# Patient Record
Sex: Male | Born: 1955 | Race: Black or African American | Hispanic: No | Marital: Single | Smoking: Current every day smoker
Health system: Southern US, Community
[De-identification: ages and names within clinical notes are randomized; demographics above are authoritative.]

## PROBLEM LIST (undated history)

## (undated) DIAGNOSIS — G4730 Sleep apnea, unspecified: Secondary | ICD-10-CM

## (undated) DIAGNOSIS — E785 Hyperlipidemia, unspecified: Secondary | ICD-10-CM

## (undated) DIAGNOSIS — D751 Secondary polycythemia: Secondary | ICD-10-CM

## (undated) DIAGNOSIS — K219 Gastro-esophageal reflux disease without esophagitis: Secondary | ICD-10-CM

## (undated) DIAGNOSIS — E119 Type 2 diabetes mellitus without complications: Secondary | ICD-10-CM

## (undated) DIAGNOSIS — M5430 Sciatica, unspecified side: Secondary | ICD-10-CM

## (undated) DIAGNOSIS — I10 Essential (primary) hypertension: Secondary | ICD-10-CM

## (undated) DIAGNOSIS — M1990 Unspecified osteoarthritis, unspecified site: Secondary | ICD-10-CM

## (undated) HISTORY — DX: Secondary polycythemia: D75.1

---

## 2005-05-12 ENCOUNTER — Inpatient Hospital Stay: Payer: Self-pay | Admitting: Internal Medicine

## 2005-05-26 ENCOUNTER — Emergency Department: Payer: Self-pay | Admitting: Emergency Medicine

## 2005-06-23 ENCOUNTER — Emergency Department: Payer: Self-pay | Admitting: Unknown Physician Specialty

## 2005-08-28 ENCOUNTER — Ambulatory Visit: Payer: Self-pay

## 2005-10-24 ENCOUNTER — Ambulatory Visit: Payer: Self-pay | Admitting: Family Medicine

## 2005-10-25 ENCOUNTER — Ambulatory Visit: Payer: Self-pay

## 2006-07-06 ENCOUNTER — Ambulatory Visit: Payer: Self-pay

## 2006-07-30 ENCOUNTER — Ambulatory Visit: Payer: Self-pay

## 2006-08-02 ENCOUNTER — Emergency Department: Payer: Self-pay | Admitting: Emergency Medicine

## 2006-08-25 ENCOUNTER — Ambulatory Visit: Payer: Self-pay

## 2006-12-01 ENCOUNTER — Emergency Department: Payer: Self-pay | Admitting: Internal Medicine

## 2007-01-11 ENCOUNTER — Ambulatory Visit: Payer: Self-pay

## 2007-03-03 ENCOUNTER — Other Ambulatory Visit: Payer: Self-pay

## 2007-03-03 ENCOUNTER — Ambulatory Visit: Payer: Self-pay | Admitting: Unknown Physician Specialty

## 2007-10-04 ENCOUNTER — Emergency Department: Payer: Self-pay | Admitting: Emergency Medicine

## 2007-10-26 ENCOUNTER — Ambulatory Visit: Payer: Self-pay | Admitting: Pain Medicine

## 2007-10-26 ENCOUNTER — Ambulatory Visit: Payer: Self-pay | Admitting: Unknown Physician Specialty

## 2007-11-16 ENCOUNTER — Ambulatory Visit: Payer: Self-pay | Admitting: Unknown Physician Specialty

## 2008-12-19 ENCOUNTER — Emergency Department: Payer: Self-pay | Admitting: Emergency Medicine

## 2009-03-13 ENCOUNTER — Emergency Department: Payer: Self-pay | Admitting: Emergency Medicine

## 2009-09-20 ENCOUNTER — Emergency Department: Payer: Self-pay | Admitting: Emergency Medicine

## 2009-10-09 ENCOUNTER — Emergency Department: Payer: Self-pay | Admitting: Unknown Physician Specialty

## 2010-11-14 ENCOUNTER — Emergency Department: Payer: Self-pay | Admitting: Emergency Medicine

## 2011-01-23 ENCOUNTER — Emergency Department: Payer: Self-pay | Admitting: Internal Medicine

## 2011-03-10 ENCOUNTER — Ambulatory Visit: Payer: Self-pay | Admitting: Pain Medicine

## 2011-07-28 ENCOUNTER — Emergency Department: Payer: Self-pay | Admitting: *Deleted

## 2012-01-14 DIAGNOSIS — Z79899 Other long term (current) drug therapy: Secondary | ICD-10-CM | POA: Insufficient documentation

## 2012-01-14 DIAGNOSIS — Z23 Encounter for immunization: Secondary | ICD-10-CM | POA: Insufficient documentation

## 2012-01-14 DIAGNOSIS — E291 Testicular hypofunction: Secondary | ICD-10-CM | POA: Insufficient documentation

## 2012-01-14 DIAGNOSIS — R6882 Decreased libido: Secondary | ICD-10-CM | POA: Insufficient documentation

## 2012-01-14 DIAGNOSIS — N529 Male erectile dysfunction, unspecified: Secondary | ICD-10-CM | POA: Insufficient documentation

## 2012-01-14 DIAGNOSIS — N138 Other obstructive and reflux uropathy: Secondary | ICD-10-CM | POA: Insufficient documentation

## 2012-01-27 ENCOUNTER — Ambulatory Visit: Payer: Self-pay | Admitting: Internal Medicine

## 2012-01-27 LAB — IRON AND TIBC
Iron Bind.Cap.(Total): 272 ug/dL (ref 250–450)
Iron Saturation: 46 %
Iron: 125 ug/dL (ref 65–175)
Unbound Iron-Bind.Cap.: 147 ug/dL

## 2012-01-27 LAB — CBC CANCER CENTER
Basophil #: 0.1 x10 3/mm (ref 0.0–0.1)
Basophil %: 1.5 %
Eosinophil #: 0.1 x10 3/mm (ref 0.0–0.7)
Eosinophil %: 0.8 %
HCT: 54 % — ABNORMAL HIGH (ref 40.0–52.0)
HGB: 18.7 g/dL — ABNORMAL HIGH (ref 13.0–18.0)
Lymphocyte #: 1.7 x10 3/mm (ref 1.0–3.6)
Lymphocyte %: 25.9 %
MCH: 33.4 pg (ref 26.0–34.0)
MCHC: 34.6 g/dL (ref 32.0–36.0)
MCV: 97 fL (ref 80–100)
Monocyte #: 0.5 x10 3/mm (ref 0.2–1.0)
Monocyte %: 7.3 %
Neutrophil #: 4.2 x10 3/mm (ref 1.4–6.5)
Neutrophil %: 64.5 %
Platelet: 177 x10 3/mm (ref 150–440)
RBC: 5.6 10*6/uL (ref 4.40–5.90)
RDW: 13.8 % (ref 11.5–14.5)
WBC: 6.6 x10 3/mm (ref 3.8–10.6)

## 2012-01-27 LAB — FERRITIN: Ferritin (ARMC): 117 ng/mL (ref 8–388)

## 2012-02-03 LAB — CANCER CENTER HEMATOCRIT: HCT: 48.9 % (ref 40.0–52.0)

## 2012-02-10 LAB — CANCER CENTER HEMATOCRIT: HCT: 51.7 % (ref 40.0–52.0)

## 2012-02-17 LAB — CANCER CENTER HEMATOCRIT: HCT: 48.4 % (ref 40.0–52.0)

## 2012-02-25 ENCOUNTER — Ambulatory Visit: Payer: Self-pay | Admitting: Internal Medicine

## 2012-03-02 LAB — CBC CANCER CENTER
Basophil #: 0 x10 3/mm (ref 0.0–0.1)
Basophil %: 0.6 %
Eosinophil #: 0 x10 3/mm (ref 0.0–0.7)
Eosinophil %: 0.6 %
HCT: 53.2 % — ABNORMAL HIGH (ref 40.0–52.0)
HGB: 18.2 g/dL — ABNORMAL HIGH (ref 13.0–18.0)
Lymphocyte #: 1.9 x10 3/mm (ref 1.0–3.6)
Lymphocyte %: 28.6 %
MCH: 32.8 pg (ref 26.0–34.0)
MCHC: 34.2 g/dL (ref 32.0–36.0)
MCV: 96 fL (ref 80–100)
Monocyte #: 0.6 x10 3/mm (ref 0.2–1.0)
Monocyte %: 8.7 %
Neutrophil #: 4 x10 3/mm (ref 1.4–6.5)
Neutrophil %: 61.5 %
Platelet: 177 x10 3/mm (ref 150–440)
RBC: 5.55 10*6/uL (ref 4.40–5.90)
RDW: 14.1 % (ref 11.5–14.5)
WBC: 6.5 x10 3/mm (ref 3.8–10.6)

## 2012-03-08 ENCOUNTER — Ambulatory Visit: Payer: Self-pay | Admitting: Family Medicine

## 2012-03-23 LAB — CANCER CENTER HEMATOCRIT: HCT: 50.1 % (ref 40.0–52.0)

## 2012-03-27 ENCOUNTER — Ambulatory Visit: Payer: Self-pay | Admitting: Internal Medicine

## 2012-04-01 ENCOUNTER — Other Ambulatory Visit: Payer: Self-pay | Admitting: Neurological Surgery

## 2012-04-01 DIAGNOSIS — M4712 Other spondylosis with myelopathy, cervical region: Secondary | ICD-10-CM

## 2012-04-06 ENCOUNTER — Ambulatory Visit
Admission: RE | Admit: 2012-04-06 | Discharge: 2012-04-06 | Disposition: A | Discharge status: Home / Self Care | Payer: Medicaid Other | Source: Ambulatory Visit | Attending: Neurological Surgery | Admitting: Neurological Surgery

## 2012-04-06 DIAGNOSIS — M4712 Other spondylosis with myelopathy, cervical region: Secondary | ICD-10-CM

## 2012-04-12 DIAGNOSIS — R972 Elevated prostate specific antigen [PSA]: Secondary | ICD-10-CM | POA: Insufficient documentation

## 2012-04-13 LAB — CANCER CENTER HEMATOCRIT: HCT: 53.1 % — ABNORMAL HIGH (ref 40.0–52.0)

## 2012-04-24 ENCOUNTER — Ambulatory Visit: Payer: Self-pay | Admitting: Internal Medicine

## 2012-05-04 LAB — CANCER CENTER HEMATOCRIT: HCT: 54.1 % — ABNORMAL HIGH (ref 40.0–52.0)

## 2012-05-11 LAB — CANCER CENTER HEMATOCRIT: HCT: 49.7 % (ref 40.0–52.0)

## 2012-05-18 LAB — CANCER CENTER HEMATOCRIT: HCT: 48.6 % (ref 40.0–52.0)

## 2012-05-25 ENCOUNTER — Ambulatory Visit: Payer: Self-pay | Admitting: Internal Medicine

## 2012-05-25 LAB — CANCER CENTER HEMATOCRIT: HCT: 52.1 % — ABNORMAL HIGH (ref 40.0–52.0)

## 2012-06-15 LAB — CANCER CENTER HEMATOCRIT: HCT: 48.8 % (ref 40.0–52.0)

## 2012-06-24 ENCOUNTER — Ambulatory Visit: Payer: Self-pay | Admitting: Internal Medicine

## 2012-07-06 LAB — CBC CANCER CENTER
Basophil #: 0 x10 3/mm (ref 0.0–0.1)
Basophil %: 0.6 %
Eosinophil #: 0 x10 3/mm (ref 0.0–0.7)
Eosinophil %: 0.6 %
HCT: 50.6 % (ref 40.0–52.0)
HGB: 17.5 g/dL (ref 13.0–18.0)
Lymphocyte #: 1.7 x10 3/mm (ref 1.0–3.6)
Lymphocyte %: 26.8 %
MCH: 32.2 pg (ref 26.0–34.0)
MCHC: 34.5 g/dL (ref 32.0–36.0)
MCV: 93 fL (ref 80–100)
Monocyte #: 0.7 x10 3/mm (ref 0.2–1.0)
Monocyte %: 10.8 %
Neutrophil #: 4 x10 3/mm (ref 1.4–6.5)
Neutrophil %: 61.2 %
Platelet: 173 x10 3/mm (ref 150–440)
RBC: 5.42 10*6/uL (ref 4.40–5.90)
RDW: 13.8 % (ref 11.5–14.5)
WBC: 6.5 x10 3/mm (ref 3.8–10.6)

## 2012-07-25 ENCOUNTER — Ambulatory Visit: Payer: Self-pay | Admitting: Internal Medicine

## 2012-07-27 LAB — CANCER CENTER HEMATOCRIT: HCT: 52.1 % — ABNORMAL HIGH (ref 40.0–52.0)

## 2012-08-17 LAB — CANCER CENTER HEMATOCRIT: HCT: 48.2 % (ref 40.0–52.0)

## 2012-08-24 ENCOUNTER — Ambulatory Visit: Payer: Self-pay | Admitting: Internal Medicine

## 2012-09-07 LAB — CANCER CENTER HEMATOCRIT: HCT: 50.4 % (ref 40.0–52.0)

## 2012-09-24 ENCOUNTER — Ambulatory Visit: Payer: Self-pay | Admitting: Internal Medicine

## 2012-09-28 LAB — CANCER CENTER HEMATOCRIT: HCT: 47.8 % (ref 40.0–52.0)

## 2012-10-19 LAB — CANCER CENTER HEMATOCRIT: HCT: 50.6 % (ref 40.0–52.0)

## 2012-10-25 ENCOUNTER — Ambulatory Visit: Payer: Self-pay | Admitting: Internal Medicine

## 2012-11-23 LAB — CANCER CENTER HEMATOCRIT: HCT: 51.1 % (ref 40.0–52.0)

## 2012-11-24 ENCOUNTER — Ambulatory Visit: Payer: Self-pay | Admitting: Internal Medicine

## 2012-12-07 LAB — CANCER CENTER HEMATOCRIT: HCT: 49.9 % (ref 40.0–52.0)

## 2012-12-21 LAB — CANCER CENTER HEMATOCRIT: HCT: 51 % (ref 40.0–52.0)

## 2012-12-25 ENCOUNTER — Ambulatory Visit: Payer: Self-pay | Admitting: Internal Medicine

## 2013-01-04 LAB — CANCER CENTER HEMATOCRIT: HCT: 46.5 % (ref 40.0–52.0)

## 2013-01-18 LAB — CANCER CENTER HEMATOCRIT: HCT: 52.4 % — ABNORMAL HIGH (ref 40.0–52.0)

## 2013-01-24 ENCOUNTER — Ambulatory Visit: Payer: Self-pay | Admitting: Internal Medicine

## 2013-01-25 ENCOUNTER — Emergency Department: Payer: Self-pay | Admitting: Emergency Medicine

## 2013-02-01 ENCOUNTER — Ambulatory Visit: Payer: Self-pay | Admitting: Internal Medicine

## 2013-02-01 LAB — CANCER CENTER HEMATOCRIT: HCT: 48.4 % (ref 40.0–52.0)

## 2013-02-15 LAB — CANCER CENTER HEMATOCRIT: HCT: 54.7 % — ABNORMAL HIGH (ref 40.0–52.0)

## 2013-02-24 ENCOUNTER — Ambulatory Visit: Payer: Self-pay | Admitting: Internal Medicine

## 2013-03-01 LAB — CBC CANCER CENTER
Basophil #: 0 x10 3/mm (ref 0.0–0.1)
Basophil %: 0.5 %
Eosinophil #: 0 x10 3/mm (ref 0.0–0.7)
Eosinophil %: 0.5 %
HCT: 47.9 % (ref 40.0–52.0)
HGB: 16 g/dL (ref 13.0–18.0)
Lymphocyte #: 2 x10 3/mm (ref 1.0–3.6)
Lymphocyte %: 23.6 %
MCH: 30.8 pg (ref 26.0–34.0)
MCHC: 33.4 g/dL (ref 32.0–36.0)
MCV: 92 fL (ref 80–100)
Monocyte #: 0.8 x10 3/mm (ref 0.2–1.0)
Monocyte %: 9.6 %
Neutrophil #: 5.7 x10 3/mm (ref 1.4–6.5)
Neutrophil %: 65.8 %
Platelet: 178 x10 3/mm (ref 150–440)
RBC: 5.19 10*6/uL (ref 4.40–5.90)
RDW: 14.5 % (ref 11.5–14.5)
WBC: 8.7 x10 3/mm (ref 3.8–10.6)

## 2013-03-15 LAB — HEMATOCRIT: HCT: 49.8 % (ref 40.0–52.0)

## 2013-03-27 ENCOUNTER — Ambulatory Visit: Payer: Self-pay | Admitting: Internal Medicine

## 2013-04-19 LAB — CANCER CENTER HEMATOCRIT: HCT: 50.9 % (ref 40.0–52.0)

## 2013-04-24 ENCOUNTER — Ambulatory Visit: Payer: Self-pay | Admitting: Internal Medicine

## 2013-05-17 LAB — CANCER CENTER HEMATOCRIT: HCT: 50.2 % (ref 40.0–52.0)

## 2013-05-23 DIAGNOSIS — R339 Retention of urine, unspecified: Secondary | ICD-10-CM | POA: Insufficient documentation

## 2013-05-25 ENCOUNTER — Ambulatory Visit: Payer: Self-pay | Admitting: Internal Medicine

## 2013-06-14 LAB — CANCER CENTER HEMATOCRIT: HCT: 46.3 % (ref 40.0–52.0)

## 2013-06-24 ENCOUNTER — Ambulatory Visit: Payer: Self-pay | Admitting: Internal Medicine

## 2013-07-12 LAB — CANCER CENTER HEMATOCRIT: HCT: 46.1 % (ref 40.0–52.0)

## 2013-07-25 ENCOUNTER — Ambulatory Visit: Payer: Self-pay | Admitting: Internal Medicine

## 2013-07-25 ENCOUNTER — Emergency Department: Payer: Self-pay | Admitting: Emergency Medicine

## 2013-08-02 ENCOUNTER — Ambulatory Visit: Payer: Self-pay | Admitting: Internal Medicine

## 2013-08-02 LAB — CBC CANCER CENTER
Basophil #: 0.1 x10 3/mm (ref 0.0–0.1)
Basophil %: 0.8 %
Eosinophil #: 0.1 x10 3/mm (ref 0.0–0.7)
Eosinophil %: 1.1 %
HCT: 49.7 % (ref 40.0–52.0)
HGB: 16.4 g/dL (ref 13.0–18.0)
Lymphocyte #: 2.1 x10 3/mm (ref 1.0–3.6)
Lymphocyte %: 21.5 %
MCH: 30.9 pg (ref 26.0–34.0)
MCHC: 33 g/dL (ref 32.0–36.0)
MCV: 94 fL (ref 80–100)
Monocyte #: 1.2 x10 3/mm — ABNORMAL HIGH (ref 0.2–1.0)
Monocyte %: 12.1 %
Neutrophil #: 6.3 x10 3/mm (ref 1.4–6.5)
Neutrophil %: 64.5 %
Platelet: 205 x10 3/mm (ref 150–440)
RBC: 5.31 10*6/uL (ref 4.40–5.90)
RDW: 15.5 % — ABNORMAL HIGH (ref 11.5–14.5)
WBC: 9.7 x10 3/mm (ref 3.8–10.6)

## 2013-08-05 ENCOUNTER — Emergency Department: Payer: Self-pay | Admitting: Emergency Medicine

## 2013-08-24 ENCOUNTER — Ambulatory Visit: Payer: Self-pay | Admitting: Internal Medicine

## 2013-09-25 ENCOUNTER — Emergency Department: Payer: Self-pay | Admitting: Emergency Medicine

## 2013-10-25 ENCOUNTER — Ambulatory Visit: Payer: Self-pay | Admitting: Internal Medicine

## 2013-10-25 LAB — CANCER CENTER HEMATOCRIT: HCT: 49 % (ref 40.0–52.0)

## 2013-11-24 ENCOUNTER — Ambulatory Visit: Payer: Self-pay | Admitting: Internal Medicine

## 2014-01-17 ENCOUNTER — Ambulatory Visit: Payer: Self-pay | Admitting: Internal Medicine

## 2014-01-17 LAB — CANCER CENTER HEMATOCRIT: HCT: 51.9 % (ref 40.0–52.0)

## 2014-01-24 ENCOUNTER — Ambulatory Visit: Payer: Self-pay | Admitting: Internal Medicine

## 2014-04-10 ENCOUNTER — Ambulatory Visit: Payer: Self-pay | Admitting: Internal Medicine

## 2014-05-29 ENCOUNTER — Ambulatory Visit
Admit: 2014-05-29 | Disposition: A | Discharge status: Home / Self Care | Payer: Self-pay | Attending: Internal Medicine | Admitting: Internal Medicine

## 2014-05-29 LAB — CANCER CENTER HEMATOCRIT: HCT: 53 % — ABNORMAL HIGH (ref 40.0–52.0)

## 2014-06-30 ENCOUNTER — Encounter: Payer: Self-pay | Admitting: *Deleted

## 2014-06-30 ENCOUNTER — Other Ambulatory Visit: Payer: Self-pay | Admitting: *Deleted

## 2014-06-30 DIAGNOSIS — D751 Secondary polycythemia: Secondary | ICD-10-CM

## 2014-06-30 HISTORY — DX: Secondary polycythemia: D75.1

## 2014-07-04 ENCOUNTER — Inpatient Hospital Stay: Payer: Medicare HMO | Attending: Internal Medicine

## 2014-07-04 ENCOUNTER — Ambulatory Visit: Payer: Medicaid Other | Admitting: Internal Medicine

## 2014-07-24 ENCOUNTER — Other Ambulatory Visit: Payer: Self-pay | Admitting: *Deleted

## 2014-07-24 DIAGNOSIS — D751 Secondary polycythemia: Secondary | ICD-10-CM

## 2014-07-26 ENCOUNTER — Inpatient Hospital Stay: Payer: Medicare HMO

## 2014-07-26 ENCOUNTER — Inpatient Hospital Stay: Payer: Medicare HMO | Attending: Internal Medicine

## 2014-07-26 ENCOUNTER — Encounter: Payer: Self-pay | Admitting: Internal Medicine

## 2014-07-26 ENCOUNTER — Inpatient Hospital Stay (HOSPITAL_BASED_OUTPATIENT_CLINIC_OR_DEPARTMENT_OTHER): Payer: Medicare HMO | Admitting: Internal Medicine

## 2014-07-26 VITALS — BP 145/86 | HR 80 | Temp 96.3°F | Ht 71.0 in | Wt 300.7 lb

## 2014-07-26 DIAGNOSIS — D751 Secondary polycythemia: Secondary | ICD-10-CM | POA: Insufficient documentation

## 2014-07-26 DIAGNOSIS — Z79899 Other long term (current) drug therapy: Secondary | ICD-10-CM | POA: Diagnosis not present

## 2014-07-26 DIAGNOSIS — E291 Testicular hypofunction: Secondary | ICD-10-CM | POA: Diagnosis not present

## 2014-07-26 DIAGNOSIS — Z7982 Long term (current) use of aspirin: Secondary | ICD-10-CM | POA: Insufficient documentation

## 2014-07-26 DIAGNOSIS — I10 Essential (primary) hypertension: Secondary | ICD-10-CM

## 2014-07-26 DIAGNOSIS — Z794 Long term (current) use of insulin: Secondary | ICD-10-CM | POA: Diagnosis not present

## 2014-07-26 DIAGNOSIS — Z9989 Dependence on other enabling machines and devices: Secondary | ICD-10-CM | POA: Diagnosis not present

## 2014-07-26 DIAGNOSIS — R5382 Chronic fatigue, unspecified: Secondary | ICD-10-CM | POA: Insufficient documentation

## 2014-07-26 DIAGNOSIS — M545 Low back pain: Secondary | ICD-10-CM | POA: Diagnosis not present

## 2014-07-26 DIAGNOSIS — G8929 Other chronic pain: Secondary | ICD-10-CM

## 2014-07-26 DIAGNOSIS — G4730 Sleep apnea, unspecified: Secondary | ICD-10-CM | POA: Insufficient documentation

## 2014-07-26 DIAGNOSIS — M1990 Unspecified osteoarthritis, unspecified site: Secondary | ICD-10-CM | POA: Insufficient documentation

## 2014-07-26 DIAGNOSIS — E785 Hyperlipidemia, unspecified: Secondary | ICD-10-CM

## 2014-07-26 DIAGNOSIS — R0609 Other forms of dyspnea: Secondary | ICD-10-CM | POA: Insufficient documentation

## 2014-07-26 DIAGNOSIS — E119 Type 2 diabetes mellitus without complications: Secondary | ICD-10-CM

## 2014-07-26 DIAGNOSIS — F17210 Nicotine dependence, cigarettes, uncomplicated: Secondary | ICD-10-CM | POA: Diagnosis not present

## 2014-07-26 LAB — CBC WITH DIFFERENTIAL/PLATELET
Basophils Absolute: 0 10*3/uL (ref 0–0.1)
Basophils Relative: 0 %
Eosinophils Absolute: 0.1 10*3/uL (ref 0–0.7)
Eosinophils Relative: 2 %
HCT: 49.5 % (ref 40.0–52.0)
Hemoglobin: 16.7 g/dL (ref 13.0–18.0)
Lymphocytes Relative: 31 %
Lymphs Abs: 2.4 10*3/uL (ref 1.0–3.6)
MCH: 31.5 pg (ref 26.0–34.0)
MCHC: 33.8 g/dL (ref 32.0–36.0)
MCV: 93.3 fL (ref 80.0–100.0)
Monocytes Absolute: 0.9 10*3/uL (ref 0.2–1.0)
Monocytes Relative: 11 %
Neutro Abs: 4.4 10*3/uL (ref 1.4–6.5)
Neutrophils Relative %: 56 %
Platelets: 174 10*3/uL (ref 150–440)
RBC: 5.3 MIL/uL (ref 4.40–5.90)
RDW: 14.6 % — ABNORMAL HIGH (ref 11.5–14.5)
WBC: 7.8 10*3/uL (ref 3.8–10.6)

## 2014-07-26 NOTE — Progress Notes (Signed)
[place]  [phone] [phone]     ID: Corey Foster OB: 05-03-1955  MR#: [id number]  FHL#:[id number]  Patient Care Team: Lorelee Market, MD as PCP - General (Family Medicine)  CHIEF COMPLAINT/DIAGNOSIS:  Secondary Erythrocytosis -  multifactorial, including chronic smoking history, AndroGel use for the last 2 years, and known history of sleep apnea requiring CPAP. Started on phlebotomy 01/27/12, if Hct is 50 or higher.  Labs on 01/27/12 -  Hb 18.7, Hct 54, WBC 6600, platelets 177K. Serum EPO 3.0. COHb 3.4%. [id number] mutation negative. HPI:      Review of Systems:  Review of Systems:  Review of Systems   As in HPI. No fevers or night sweats. No headaches or focal weakness. No cough, shortness of breath or hemoptysis. No retrosternal chest pain, orthopnea or PND. No abdominal pain, nausea or vomiting. No diarrhea. No dysuria or hematuria.  No numbness or tingling of extremities.  No polyuria or polydipsia.  Denies falls or loss of consciousness.         Allergies:  No Known Allergies:   Smoking History: Smoking History 10(1)Cigarettes per day(1)Smoking Cessation Information Given to Patient .  PFSH: Additional Past Medical and Surgical History: Past Medical History/Past Surgical History -      Family History -.    Social History -      HISTORY OF PRESENT ILLNESS:  Patient returns for continued hematology followup, he was seen in June 2015. In between visits, he had hematocrit monitored and it has been 51.9 in nov 2015, 53.0 in 05/29/14 but is back in upper normal range at 49.5 today. States that he is overall doing steady, denies any new complaints, states that chronic low back pain from disc disease is unchanged. States that he has chronic fatigue and dyspnea on exertion, but remains physically active and ambulatory. He ambulates with a cane due to back problems and arthritis. Denies any symptoms of major headaches, visual  disturbances, facial flushing, deep venous thrombosis, pulmonary embolism, transient ischemic attack, stroke, or heart attack. No new bone pains. He is applying 2 applications of topical testosterone daily, still smokes up to 1/2 pack per day.   REVIEW OF SYSTEMS:   ROS As in HPI above. In addition, no fevers or night sweats. No new headaches or focal weakness. No  sore throat, cough, shortness of breath, sputum, hemoptysis or chest pain. No dizziness or palpitation. No abdominal pain, constipation, diarrhea, dysuria or hematuria. No new skin rash or bleeding symptoms. No new paresthesias in extremities.    PAST MEDICAL HISTORY: Past Medical History  Diagnosis Date  . 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  Denies hematological disorders or malignancy.  ADVANCED DIRECTIVES:   SOCIAL HISTORY: History  Substance Use Topics  . Smoking status: Current Every Day Smoker -- 0.25 packs/day for 40 years    Types: Cigarettes  . Smokeless tobacco: Not on file  . Alcohol Use: Not on file  Chronic smoker half pack per day x33 years.  Occasional alcohol intake.  Occasional recreational drug usage.  Ambulates with help of cane  Not on File  Current Outpatient Prescriptions  Medication Sig Dispense Refill  . aspirin 325 MG tablet Take 325  mg by mouth daily.    . chlorhexidine (PERIDEX) 0.12 % solution Use as directed 15 mLs in the mouth or throat 4 (four) times daily.    . cholecalciferol (VITAMIN D) 400 UNITS TABS tablet Take 5,000 Units by mouth daily.    [name] glyBURIDE (DIABETA) 5 MG tablet Take 5 mg by mouth 2 (two) times daily with a meal.    . insulin glargine (LANTUS) 100 UNIT/ML injection Inject 60 Units  into the skin at bedtime.    . meloxicam (MOBIC) 7.5 MG tablet Take 7.5 mg by mouth 2 (two) times daily.    . metFORMIN (GLUMETZA) 1000 MG (MOD) 24 hr tablet Take 1,000 mg by mouth daily with breakfast.    . naproxen (NAPROSYN) 500 MG tablet Take 500 mg by mouth 2 (two) times daily with a meal.    . Omega-3 Fatty Acids (FISH OIL) 1000 MG CAPS Take 1 capsule by mouth daily.    [name] oxycodone (ROXICODONE) 30 MG immediate release tablet Take 30 mg by mouth every 6 (six) hours as needed for pain.    [name] oxyCODONE-acetaminophen (ROXICET) 5-325 MG/5ML solution Take 5 mLs by mouth every 6 (six) hours as needed for severe pain.    . ranitidine (ZANTAC) 75 MG tablet Take 75 mg by mouth 2 (two) times daily.    . simvastatin (ZOCOR) 40 MG tablet Take 40 mg by mouth daily.    [name] testosterone (ANDROGEL) 50 MG/5GM (1%) GEL Place 5 g onto the skin daily.     No current facility-administered medications for this visit.    OBJECTIVE: Filed Vitals:   07/26/14 0903  BP: 145/86  Pulse: 80  Temp: 96.3 F (35.7 C)     Body mass index is 41.96 kg/(m^2).      GENERAL: Patient is alert and oriented and in no acute distress. No icterus. HEENT: EOMs intact. No cervical lymphadenopathy. CVS: S1S2, regular LUNGS: Bilaterally clear to auscultation, no rhonchi. ABDOMEN: Soft, nontender. No hepatosplenomegaly clinically.  NEURO: grossly nonfocal, cranial nerves are intact. EXTREMITIES: No pedal edema.   LAB RESULTS: Lab Results  Component Value Date   WBC 7.8 07/26/2014   NEUTROABS 4.4 07/26/2014   HGB 16.7 07/26/2014   HCT 49.5 07/26/2014   MCV 93.3 07/26/2014   PLT 174 07/26/2014      ASSESSMENT / PLAN:   1. Secondary Erythrocytosis, multifactorial including chronic smoking history, AndroGel usage, and known history of sleep apnea requiring CPAP -  Reviewed labs from today and recent and d/w patient in detail. Hct was higher last time at 53 on Apr 6, today better at 49.6 below target range of 50. He  continues to do steady clinically, no history of thromboembolic phenomena.  He did require phlebotomy upon last visit here, today it is below target number of 50. Plan otherwise is to continue to monitor hematocrit at q 12 weeks , he will get phlebotomy 350 mL each time if hematocrit is 50 or higher. Next MD followup at 48 weeks with CBC and make further treatment planning.  2. Smoking Cessation - have again explained about the extreme importance of completely quitting smoking to see if erythrocytosis will improve; he states that he wants to try to do this on his own. Also have advised him to d/w his uorlogist if dose of Androgel can be reduced to the minimum possible effective dose. 3. In between visits, he was advised to call or come to ER in case of any new symptoms or acute sickness.  He is agreeable to this plan.      Leia Alf, MD   07/26/2014 9:57 AM

## 2014-07-29 ENCOUNTER — Emergency Department
Admission: EM | Admit: 2014-07-29 | Discharge: 2014-07-29 | Disposition: A | Discharge status: Home / Self Care | Payer: Medicare HMO | Attending: Emergency Medicine | Admitting: Emergency Medicine

## 2014-07-29 DIAGNOSIS — Z720 Tobacco use: Secondary | ICD-10-CM | POA: Diagnosis not present

## 2014-07-29 DIAGNOSIS — Z7982 Long term (current) use of aspirin: Secondary | ICD-10-CM | POA: Diagnosis not present

## 2014-07-29 DIAGNOSIS — I10 Essential (primary) hypertension: Secondary | ICD-10-CM | POA: Diagnosis not present

## 2014-07-29 DIAGNOSIS — Z202 Contact with and (suspected) exposure to infections with a predominantly sexual mode of transmission: Secondary | ICD-10-CM | POA: Diagnosis not present

## 2014-07-29 DIAGNOSIS — Z79899 Other long term (current) drug therapy: Secondary | ICD-10-CM | POA: Diagnosis not present

## 2014-07-29 DIAGNOSIS — E119 Type 2 diabetes mellitus without complications: Secondary | ICD-10-CM | POA: Diagnosis not present

## 2014-07-29 HISTORY — DX: Essential (primary) hypertension: I10

## 2014-07-29 HISTORY — DX: Unspecified osteoarthritis, unspecified site: M19.90

## 2014-07-29 HISTORY — DX: Type 2 diabetes mellitus without complications: E11.9

## 2014-07-29 LAB — CHLAMYDIA/NGC RT PCR (ARMC ONLY)
Chlamydia Tr: NOT DETECTED
N gonorrhoeae: NOT DETECTED

## 2014-07-29 MED ORDER — CEFTRIAXONE SODIUM 250 MG IJ SOLR
250.0000 mg | Freq: Once | INTRAMUSCULAR | Status: AC
  Administered 2014-07-29: 250 mg via INTRAMUSCULAR

## 2014-07-29 MED ORDER — AZITHROMYCIN 1 G PO PACK
1.0000 g | PACK | Freq: Once | ORAL | Status: AC
  Administered 2014-07-29: 1 g via ORAL

## 2014-07-29 MED ORDER — METRONIDAZOLE 500 MG PO TABS: 2000.0000 mg | ORAL_TABLET | Freq: Once | ORAL | Status: DC

## 2014-07-29 MED ORDER — CEFTRIAXONE SODIUM 250 MG IJ SOLR
INTRAMUSCULAR | Status: AC
  Administered 2014-07-29: 250 mg via INTRAMUSCULAR
  Filled 2014-07-29: qty 250

## 2014-07-29 MED ORDER — AZITHROMYCIN 1 G PO PACK
PACK | ORAL | Status: AC
  Administered 2014-07-29: 1 g via ORAL
  Filled 2014-07-29: qty 1

## 2014-07-29 NOTE — Discharge Instructions (Signed)

## 2014-07-29 NOTE — ED Notes (Signed)
Pt states he was told by his sexual partner that she tested positive for a STD , denies sx but wants to be checked./

## 2014-07-29 NOTE — ED Provider Notes (Signed)
[facility] Emergency Department Provider Note ____________________________________________  Time seen: 1645  I have reviewed the triage vital signs and the nursing notes.  HISTORY  Chief Complaint Unplanned Sexual Encounter  HPI Corey Foster is a 59 y.o. male reports to the ED for evaluation and management of symptoms related to an STD exposure. He describes that he was notified by his sex partner that she was diagnosed with gonorrhea. He is denying any active symptoms including penile discharge, painful intercourse, fevers or abdominal pain.  Past Medical History  Diagnosis Date  . 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 Arthritis   . Hypertension   . Diabetes mellitus without complication   . DJD (degenerative joint disease)    Patient Active Problem List   Diagnosis Date Noted  . 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   History reviewed. No pertinent past surgical history.  Current Outpatient Rx  Name  Route  Sig  Dispense  Refill  . aspirin 325 MG tablet   Oral   Take 325 mg by mouth daily.         . chlorhexidine (PERIDEX) 0.12 % solution   Mouth/Throat   Use as directed 15 mLs in the mouth or throat 4 (four) times daily.         . cholecalciferol (VITAMIN D) 400 UNITS TABS tablet   Oral   Take 5,000 Units by mouth daily.         [name] glyBURIDE (DIABETA) 5 MG tablet   Oral   Take 5 mg by mouth 2 (two) times daily with a meal.         . insulin glargine (LANTUS) 100 UNIT/ML injection   Subcutaneous   Inject 60 Units into the skin at bedtime.         . meloxicam (MOBIC) 7.5 MG tablet   Oral   Take 7.5 mg by mouth 2 (two) times daily.         . metFORMIN (GLUMETZA) 1000 MG (MOD) 24 hr tablet   Oral   Take 1,000 mg by mouth daily with breakfast.         . metroNIDAZOLE (FLAGYL) 500 MG tablet   Oral   Take 4 tablets (2,000 mg total) by mouth once.   4 tablet   0   . naproxen (NAPROSYN) 500 MG tablet   Oral   Take 500 mg by  mouth 2 (two) times daily with a meal.         . Omega-3 Fatty Acids (FISH OIL) 1000 MG CAPS   Oral   Take 1 capsule by mouth daily.         [name] oxycodone (ROXICODONE) 30 MG immediate release tablet   Oral   Take 30 mg by mouth every 6 (six) hours as needed for pain.         [name] oxyCODONE-acetaminophen (ROXICET) 5-325 MG/5ML solution   Oral   Take 5 mLs by mouth every 6 (six) hours as needed for severe pain.         . ranitidine (ZANTAC) 75 MG tablet   Oral   Take 75 mg by mouth 2 (two) times daily.         . simvastatin (ZOCOR) 40 MG tablet   Oral   Take 40 mg by mouth daily.         [name] testosterone (ANDROGEL) 50 MG/5GM (1%) GEL   Transdermal   Place 5 g onto the skin daily.  Allergies Review of patient's allergies indicates no known allergies.  No family history on file.  Social History History  Substance Use Topics  . Smoking status: Current Every Day Smoker -- 0.25 packs/day for 40 years    Types: Cigarettes  . Smokeless tobacco: Not on file  . Alcohol Use: No   Review of Systems  Constitutional: Negative for fever. Eyes: Negative for visual changes. ENT: Negative for sore throat. Cardiovascular: Negative for chest pain. Respiratory: Negative for shortness of breath. Gastrointestinal: Negative for abdominal pain, vomiting and diarrhea. Genitourinary: Negative for dysuria. Positive for STD exposure. Musculoskeletal: Negative for back pain. Skin: Negative for rash. Neurological: Negative for headaches, focal weakness or numbness. ____________________________________________  PHYSICAL EXAM:  VITAL SIGNS: ED Triage Vitals  Enc Vitals Group     BP 07/29/14 1608 137/87 mmHg     Pulse Rate 07/29/14 1606 100     Resp 07/29/14 1606 18     Temp 07/29/14 1606 98.1 F (36.7 C)     Temp src --      SpO2 07/29/14 1606 98 %     Weight 07/29/14 1606 300 lb (136.079 kg)     Height 07/29/14 1606 5\' 11"  (1.803 m)     Head Cir --      Peak Flow --       Pain Score 07/29/14 1608 7     Pain Loc --      Pain Edu? --      Excl. in [place]? --    Constitutional: Alert and oriented. Well appearing and in no distress. Eyes: Conjunctivae are normal. PERRL. Normal extraocular movements. ENT   Head: Normocephalic and atraumatic.   Nose: No congestion/rhinnorhea.   Mouth/Throat: Mucous membranes are moist.   Neck: No stridor. Cardiovascular: Normal rate, regular rhythm.  Respiratory: Normal respiratory effort. Gastrointestinal: Soft and nontender.  GU: deferred Musculoskeletal: Nontender with normal range of motion in all extremities.No lower extremity tenderness nor edema. Neurologic:  Normal speech and language. No gross focal neurologic deficits are appreciated. Skin:  Skin is warm, dry and intact. No rash noted. Psychiatric: Mood and affect are normal. Patient exhibits appropriate insight and judgment. ____________________________________________    LABS (pertinent positives/negatives)  GC Urine - pending ____________________________________________  PROCEDURES Azithromycin 1 g PO Rocephin 250 mg IM ____________________________________________  INITIAL IMPRESSION / ASSESSMENT AND PLAN / ED COURSE  Empiric treatment for NG, CT, and trichomoniasis treatment with prescription for Flagyl. [facility] for further care.  ____________________________________________  FINAL CLINICAL IMPRESSION(S) / ED DIAGNOSES  Final diagnoses:  STD exposure     Melvenia Needles, PA-C 07/29/14 1732  Delman Kitten, MD 07/29/14 2152

## 2014-10-18 ENCOUNTER — Inpatient Hospital Stay: Payer: Medicare HMO

## 2014-10-18 ENCOUNTER — Inpatient Hospital Stay: Payer: Medicare HMO | Attending: Internal Medicine

## 2014-10-18 DIAGNOSIS — E291 Testicular hypofunction: Secondary | ICD-10-CM | POA: Insufficient documentation

## 2014-10-18 DIAGNOSIS — G4730 Sleep apnea, unspecified: Secondary | ICD-10-CM | POA: Diagnosis not present

## 2014-10-18 DIAGNOSIS — D751 Secondary polycythemia: Secondary | ICD-10-CM | POA: Insufficient documentation

## 2014-10-18 DIAGNOSIS — Z79899 Other long term (current) drug therapy: Secondary | ICD-10-CM | POA: Insufficient documentation

## 2014-10-18 DIAGNOSIS — F17210 Nicotine dependence, cigarettes, uncomplicated: Secondary | ICD-10-CM | POA: Insufficient documentation

## 2014-10-18 DIAGNOSIS — Z9989 Dependence on other enabling machines and devices: Secondary | ICD-10-CM | POA: Diagnosis not present

## 2014-10-18 LAB — HEMATOCRIT: HCT: 44.4 % (ref 40.0–52.0)

## 2014-11-08 ENCOUNTER — Ambulatory Visit: Payer: Medicare HMO | Attending: Anesthesiology | Admitting: Anesthesiology

## 2014-11-08 ENCOUNTER — Encounter: Payer: Self-pay | Admitting: Anesthesiology

## 2014-11-08 VITALS — BP 135/80 | HR 85 | Temp 98.6°F | Resp 18 | Ht 71.0 in | Wt 300.0 lb

## 2014-11-08 DIAGNOSIS — F17200 Nicotine dependence, unspecified, uncomplicated: Secondary | ICD-10-CM | POA: Diagnosis not present

## 2014-11-08 DIAGNOSIS — M5136 Other intervertebral disc degeneration, lumbar region: Secondary | ICD-10-CM | POA: Insufficient documentation

## 2014-11-08 DIAGNOSIS — M4802 Spinal stenosis, cervical region: Secondary | ICD-10-CM | POA: Diagnosis not present

## 2014-11-08 DIAGNOSIS — I10 Essential (primary) hypertension: Secondary | ICD-10-CM

## 2014-11-08 DIAGNOSIS — M545 Low back pain: Secondary | ICD-10-CM | POA: Diagnosis not present

## 2014-11-08 DIAGNOSIS — E0840 Diabetes mellitus due to underlying condition with diabetic neuropathy, unspecified: Secondary | ICD-10-CM

## 2014-11-08 DIAGNOSIS — M5030 Other cervical disc degeneration, unspecified cervical region: Secondary | ICD-10-CM | POA: Insufficient documentation

## 2014-11-08 DIAGNOSIS — M25552 Pain in left hip: Secondary | ICD-10-CM | POA: Insufficient documentation

## 2014-11-08 DIAGNOSIS — M51379 Other intervertebral disc degeneration, lumbosacral region without mention of lumbar back pain or lower extremity pain: Secondary | ICD-10-CM

## 2014-11-08 DIAGNOSIS — M5137 Other intervertebral disc degeneration, lumbosacral region: Secondary | ICD-10-CM

## 2014-11-08 DIAGNOSIS — G8929 Other chronic pain: Secondary | ICD-10-CM | POA: Diagnosis present

## 2014-11-08 DIAGNOSIS — M5416 Radiculopathy, lumbar region: Secondary | ICD-10-CM | POA: Insufficient documentation

## 2014-11-08 DIAGNOSIS — G545 Neuralgic amyotrophy: Secondary | ICD-10-CM | POA: Diagnosis present

## 2014-11-08 DIAGNOSIS — M5442 Lumbago with sciatica, left side: Secondary | ICD-10-CM | POA: Insufficient documentation

## 2014-11-08 DIAGNOSIS — M25559 Pain in unspecified hip: Secondary | ICD-10-CM

## 2014-11-08 DIAGNOSIS — E1140 Type 2 diabetes mellitus with diabetic neuropathy, unspecified: Secondary | ICD-10-CM | POA: Diagnosis not present

## 2014-11-08 DIAGNOSIS — M533 Sacrococcygeal disorders, not elsewhere classified: Secondary | ICD-10-CM

## 2014-11-08 NOTE — Progress Notes (Signed)
Safety precautions to be maintained throughout the outpatient stay will include: orient to surroundings, keep bed in low position, maintain call bell within reach at all times, provide assistance with transfer out of bed and ambulation.  

## 2014-11-08 NOTE — Progress Notes (Signed)
Subjective:    Patient ID: Corey Foster, male    DOB: 06-Jul-1955, 59 y.o.   MRN: [id number] This patient is a pleasant delightful gentleman who has 2 primary sources of pain The primary pain is chronic low back pain radiating into his left buttock down the left lateral thigh into the left leg ending in the left foot the left foot becomes swollen when the pain is severe and this has been going on for the past 3 years His secondary pain is chronic left hip pain which is localized to the left hip His subjective pain intensity rating is 75% Patient indicates that his pain is not associated with any trauma and started spontaneously He has been treated with various medications and was on meloxicam per meloxicam had to be discontinued because of the threat of severe renal damage  Pain Medicare current pain medications include oxycodone 20 mg and he takes it every 6 hours. He was on Neurontin but this was discontinued because it caused excessive somnolence  Other medications Other medications include albuterol alprazolam Norvasc aspirin baclofen vitamin D elevated gabapentin gemfibrozil ginseng glyburide insulin Synthroid losartan metformin omega-3 fatty acids simvastatin Cialis Micardis testosterone or AndroGel. Spiriva.[name] NePeridex Flagyl and Zantac  Allergies There are no known allergies  Past medical history Past medical history is positive for severe diabetes hypertension and hypercholesterolemia and renal dysfunction. Patient also tends to be on the obese side  Past surgical history Past surgical history is positive for excision of a lipoma of the back  Social and economic history Patient smokes about half pack of cigarettes per day and his been doing that for 35 years He does use illicit drugs He is a social drinker of alcohol   Patient is single and has 9 children a ranging from age 14 03/28/2025  He lives alone His mother is deceased and she died when he was 59 years old as a  consequence he was adopted His father died at age 73 from the complications of diabetes hypertension and Alzheimer's He has 3 brothers all of whom are alive and well and one sister who is deceased at age 45 from the complications of Alzheimer's  Imaging studies  Patient had an MRI of his lumbar spine. The results are not available to me. He plans to get a copy of the report and bring that for me to evaluate at the next visit  HPI    Review of Systems  Constitutional: Positive for fatigue. Negative for fever, chills, diaphoresis, activity change, appetite change and unexpected weight change.  HENT: Negative for congestion, dental problem, drooling, ear discharge, ear pain, facial swelling, hearing loss, mouth sores, nosebleeds, postnasal drip, rhinorrhea and sinus pressure.   Eyes: Negative.  Negative for photophobia, pain, discharge, redness, itching and visual disturbance.       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   Cardiovascular: Negative.  Negative for chest pain, palpitations and leg swelling.  Gastrointestinal: Negative.  Negative for abdominal distention.  Endocrine: Negative.  Negative for cold intolerance, heat intolerance, polydipsia and polyphagia.  Genitourinary: Negative.  Negative for dysuria, urgency, frequency, hematuria, flank pain, decreased urine volume, discharge, penile swelling, scrotal swelling, enuresis, difficulty urinating, genital sores, penile pain and testicular pain.  Musculoskeletal: Positive for myalgias, back pain, joint swelling, arthralgias and gait problem. Negative for neck pain and neck stiffness.       Patient  is borderline obese Straight-leg raising test on the left side is 20 Should leg raising test on the right side is 30 There is significant tenderness all over the left side of the paraspinous muscles at  L4-L5 Neurological evaluation using light touch and pinprick showed completely absent sensation to pinprick and light touch in both lower legs below the knees  Skin: Negative.  Negative for color change, pallor, rash and wound.  Allergic/Immunologic: Negative.  Negative for environmental allergies, food allergies and immunocompromised state.  Neurological: Positive for numbness. Negative for dizziness, tremors, seizures, syncope, facial asymmetry, speech difficulty, weakness, light-headedness and headaches.       Patient has profound numbness or loss of some sensation of both legs below the knees  Hematological: Negative.  Negative for adenopathy. Does not bruise/bleed easily.  Psychiatric/Behavioral: Negative.  Negative for suicidal ideas, hallucinations, behavioral problems, confusion, sleep disturbance, self-injury, dysphoric mood, decreased concentration and agitation. The patient is not nervous/anxious and is not hyperactive.        Objective:   Physical Exam  Constitutional: He is oriented to person, place, and time. He appears well-developed and well-nourished.  HENT:  Head: Normocephalic and atraumatic.  Right Ear: External ear normal.  Left Ear: External ear normal.  Nose: Nose normal.  Mouth/Throat: Oropharynx is clear and moist.  Eyes: Conjunctivae and EOM are normal. Pupils are equal, round, and reactive to light. Right eye exhibits no discharge. Left eye exhibits no discharge. No scleral icterus.  Neck: Normal range of motion. Neck supple. No JVD present. No tracheal deviation present. No thyromegaly present.  Cardiovascular: Normal rate, regular rhythm, normal heart sounds and intact distal pulses.  Exam reveals no gallop and no friction rub.   No murmur heard. Blood pressure is 135/80 mmHg Pulse is 85 bpm equal and regular Heart sounds 1 and 2 were heard in all areas and there were no audible murmur Temperature is 98.53F respirations are 18 breaths per minute SPO2 was 99%    Genitourinary:  Genitourinary exam was deferred  Musculoskeletal: He exhibits tenderness. He exhibits no edema.  Patient was slightly obese Straight-leg raising test on the left side was 20 Right leg raising test on the right side was 30 There was decreased sensation to light touch and pinprick in both legs below the knees Tenderness on the left side of L4-L5 PARASPINOUS muscles  Lymphadenopathy:    He has no cervical adenopathy.  Neurological: He is alert and oriented to person, place, and time. He has normal reflexes. He displays normal reflexes. No cranial nerve deficit. He exhibits normal muscle tone. Coordination normal.  Skin: Skin is warm and dry. No rash noted. He is not diaphoretic. No erythema. No pallor.  Psychiatric: He has a normal mood and affect. His behavior is normal. Judgment and thought content normal.  Nursing note and vitals reviewed.         Assessment & Plan:   Assessment 1 chronic low back pain 2 lumbar degenerative disc disease 3 lumbar radiculopathy 4 left hip pain 5 left sacroiliac joint arthropathy 6 status post hypertension 7 status post diabetes mellitus 8 diabetic neuropathy 9 cervical spinal stenosis at C3-C4   Plan of management 1 caudal epidural steroid injection 2 To review MRI imaging of the lumbar spine 3 to consider other interventions for his lumbar pain 4 left sacroiliac joint injection with blocks of S1 and S2 5 patient will continue receiving his opioids from his primary care physician and I will use any analgesic adjuvants that may  become necessary  New patient  level [place].

## 2014-11-14 ENCOUNTER — Other Ambulatory Visit: Payer: Self-pay | Admitting: Anesthesiology

## 2015-01-10 ENCOUNTER — Inpatient Hospital Stay: Payer: Medicare HMO | Attending: Internal Medicine

## 2015-01-10 ENCOUNTER — Inpatient Hospital Stay: Payer: Medicare HMO

## 2015-04-04 ENCOUNTER — Inpatient Hospital Stay: Payer: Medicare HMO

## 2015-04-04 ENCOUNTER — Inpatient Hospital Stay: Payer: Medicare HMO | Attending: Internal Medicine

## 2015-04-04 DIAGNOSIS — G4730 Sleep apnea, unspecified: Secondary | ICD-10-CM | POA: Insufficient documentation

## 2015-04-04 DIAGNOSIS — F17210 Nicotine dependence, cigarettes, uncomplicated: Secondary | ICD-10-CM | POA: Insufficient documentation

## 2015-04-04 DIAGNOSIS — D751 Secondary polycythemia: Secondary | ICD-10-CM | POA: Insufficient documentation

## 2015-04-04 LAB — HEMATOCRIT: HCT: 47.8 % (ref 40.0–52.0)

## 2015-06-26 ENCOUNTER — Other Ambulatory Visit: Payer: Self-pay | Admitting: *Deleted

## 2015-06-26 DIAGNOSIS — D751 Secondary polycythemia: Secondary | ICD-10-CM

## 2015-06-27 ENCOUNTER — Inpatient Hospital Stay: Payer: Medicare Other | Attending: Internal Medicine

## 2015-06-27 ENCOUNTER — Encounter: Payer: Self-pay | Admitting: Internal Medicine

## 2015-06-27 ENCOUNTER — Inpatient Hospital Stay (HOSPITAL_BASED_OUTPATIENT_CLINIC_OR_DEPARTMENT_OTHER): Payer: Medicare Other | Admitting: Internal Medicine

## 2015-06-27 ENCOUNTER — Inpatient Hospital Stay: Payer: Medicare Other

## 2015-06-27 ENCOUNTER — Ambulatory Visit: Payer: Medicare HMO | Admitting: Internal Medicine

## 2015-06-27 VITALS — BP 154/93 | HR 91 | Temp 97.5°F | Resp 20 | Wt 295.6 lb

## 2015-06-27 DIAGNOSIS — D751 Secondary polycythemia: Secondary | ICD-10-CM

## 2015-06-27 DIAGNOSIS — Z7984 Long term (current) use of oral hypoglycemic drugs: Secondary | ICD-10-CM | POA: Diagnosis not present

## 2015-06-27 DIAGNOSIS — R531 Weakness: Secondary | ICD-10-CM | POA: Diagnosis not present

## 2015-06-27 DIAGNOSIS — R51 Headache: Secondary | ICD-10-CM | POA: Diagnosis not present

## 2015-06-27 DIAGNOSIS — I10 Essential (primary) hypertension: Secondary | ICD-10-CM | POA: Diagnosis not present

## 2015-06-27 DIAGNOSIS — Z79899 Other long term (current) drug therapy: Secondary | ICD-10-CM

## 2015-06-27 DIAGNOSIS — F17210 Nicotine dependence, cigarettes, uncomplicated: Secondary | ICD-10-CM

## 2015-06-27 DIAGNOSIS — Z794 Long term (current) use of insulin: Secondary | ICD-10-CM

## 2015-06-27 DIAGNOSIS — R5383 Other fatigue: Secondary | ICD-10-CM

## 2015-06-27 DIAGNOSIS — E119 Type 2 diabetes mellitus without complications: Secondary | ICD-10-CM | POA: Insufficient documentation

## 2015-06-27 DIAGNOSIS — M1990 Unspecified osteoarthritis, unspecified site: Secondary | ICD-10-CM | POA: Diagnosis not present

## 2015-06-27 DIAGNOSIS — Z7982 Long term (current) use of aspirin: Secondary | ICD-10-CM | POA: Insufficient documentation

## 2015-06-27 LAB — CBC WITH DIFFERENTIAL/PLATELET
Basophils Absolute: 0 10*3/uL (ref 0–0.1)
Basophils Relative: 0 %
Eosinophils Absolute: 0.1 10*3/uL (ref 0–0.7)
Eosinophils Relative: 1 %
HCT: 53.3 % — ABNORMAL HIGH (ref 40.0–52.0)
Hemoglobin: 18.7 g/dL — ABNORMAL HIGH (ref 13.0–18.0)
Lymphocytes Relative: 25 %
Lymphs Abs: 1.7 10*3/uL (ref 1.0–3.6)
MCH: 33.5 pg (ref 26.0–34.0)
MCHC: 35.1 g/dL (ref 32.0–36.0)
MCV: 95.5 fL (ref 80.0–100.0)
Monocytes Absolute: 0.8 10*3/uL (ref 0.2–1.0)
Monocytes Relative: 12 %
Neutro Abs: 4.2 10*3/uL (ref 1.4–6.5)
Neutrophils Relative %: 62 %
Platelets: 178 10*3/uL (ref 150–440)
RBC: 5.59 MIL/uL (ref 4.40–5.90)
RDW: 14.1 % (ref 11.5–14.5)
WBC: 6.8 10*3/uL (ref 3.8–10.6)

## 2015-06-27 NOTE — Patient Instructions (Signed)

## 2015-06-27 NOTE — Progress Notes (Signed)
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   INTERVAL HISTORY:  This is my first interaction with the patient since I joined the practice September 2016. I reviewed the patient's prior charts/pertinent labs in detail; findings are summarized above.   A pleasant 60 year old male patient with above history of secondary erythrocytosis and long-standing history of smoking is here for follow-up.  Patient has intermittent headaches. He has mild intermittent fatigue. Otherwise denies any chest pain or shortness of breath or cough. Denies any unusual leg swelling. Denies any strokes.   REVIEW OF SYSTEMS:  A complete 10 point review of system is done which is negative except mentioned above/history of present illness.   PAST MEDICAL HISTORY :  Past Medical History  Diagnosis Date  . 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 Arthritis   . Hypertension   . Diabetes mellitus without complication   . DJD (degenerative joint disease)     PAST SURGICAL HISTORY :  No past surgical history on file.  FAMILY HISTORY :  No family history on file.  SOCIAL HISTORY:   Social History  Substance Use Topics  . Smoking status: Current Every Day Smoker -- 0.25 packs/day for 40 years    Types: Cigarettes  . Smokeless tobacco: Not on file  . Alcohol Use: No    ALLERGIES:  has No Known Allergies.  MEDICATIONS:  Current Outpatient Prescriptions  Medication Sig Dispense Refill  . albuterol (PROAIR HFA) 108 (90 Base) MCG/ACT inhaler     . ALPRAZolam (XANAX XR) 1 MG 24 hr tablet Take by mouth.    [name] aspirin EC 81 MG tablet Take 325 mg by mouth.    . baclofen (LIORESAL) 10 MG tablet     . cholecalciferol (VITAMIN D) 400 UNITS TABS tablet Take  5,000 Units by mouth daily.    [name] doxepin (SINEQUAN) 50 MG capsule     . Flunisolide POWD Inhale into the lungs.    [name] gemfibrozil (LOPID) 600 MG tablet Take 600 mg by mouth 2 (two) times daily before a meal.    . glyBURIDE (DIABETA) 5 MG tablet Take by mouth.    . insulin glargine (LANTUS) 100 UNIT/ML injection     . Insulin Pen Needle (B-D UF III MINI PEN NEEDLES) 31G X 5 MM MISC     . losartan (COZAAR) 25 MG tablet Take 25 mg by mouth daily.    . metFORMIN (GLUMETZA) 1000 MG (MOD) 24 hr tablet Take 1,000 mg by mouth.    . Omega-3 Fatty Acids (FISH OIL) 1000 MG CAPS     . sildenafil (REVATIO) 20 MG tablet     . tadalafil (CIALIS) 10 MG tablet Take 10 mg by mouth daily as needed for erectile dysfunction. 4 tabs    . telmisartan (MICARDIS) 20 MG tablet Take 20 mg by mouth.    . Testosterone (ANDROGEL PUMP) 20.25 MG/ACT (1.62%) GEL     . Vitamin D, Ergocalciferol, (DRISDOL) 50000 units CAPS capsule Take by mouth.    . Oxycodone HCl 20 MG TABS      No current facility-administered medications for this visit.    PHYSICAL EXAMINATION:  BP 154/93 mmHg  Pulse 91  Temp(Src) 97.5 F (36.4 C) (Tympanic)  Wt 295 lb 10.2 oz (134.1 kg)  Filed Weights   06/27/15 1121  Weight: 295 lb 10.2 oz (134.1 kg)    GENERAL: Well-nourished well-developed; Alert, no distress and comfortable.  Obese.  EYES: no pallor or icterus OROPHARYNX: no thrush or ulceration; good dentition  NECK: supple, no masses felt LYMPH:  no palpable lymphadenopathy in the cervical, axillary or inguinal regions LUNGS: clear to auscultation and  No wheeze or crackles HEART/CVS: regular rate & rhythm and no murmurs; No lower extremity edema ABDOMEN:abdomen soft, non-tender and normal bowel sounds Musculoskeletal:no cyanosis of digits and no clubbing  PSYCH: alert & oriented x 3 with fluent speech NEURO: no focal motor/sensory deficits SKIN:  no rashes or significant lesions  LABORATORY DATA:  I have reviewed the data as  listed No results found for: NA, K, CL, CO2, GLUCOSE, BUN, CREATININE, CALCIUM, PROT, ALBUMIN, AST, ALT, ALKPHOS, BILITOT, GFRNONAA, GFRAA  No results found for: SPEP, UPEP  Lab Results  Component Value Date   WBC 6.8 06/27/2015   NEUTROABS 4.2 06/27/2015   HGB 18.7* 06/27/2015   HCT 53.3* 06/27/2015   MCV 95.5 06/27/2015   PLT 178 06/27/2015      Chemistry   No results found for: NA, K, CL, CO2, BUN, CREATININE, GLU No results found for: CALCIUM, ALKPHOS, AST, ALT, BILITOT     ASSESSMENT & PLAN:   # Secondary erythrocytosis-recommend phlebotomy The hematocrit is greater than 50. Patient has been needing phlebotomy every 6 months or so. Today hematocrit is 53. Plan phlebotomy.  # smoking-counseled the patient to quit smoking. not interested in quitting smoking.  # Lung cancer screening: pt interested. We will have nurse navigator to get contact patient.  All questions were answered. The patient knows to call the clinic with any problems, questions or concerns.   # Patient follow-up with H&H in 6 months possible phlebotomy;  follow-up with me in one year H&H and possible phlebotomy.     Cammie Sickle, MD 06/27/2015 11:38 AM

## 2015-07-09 ENCOUNTER — Telehealth: Payer: Self-pay | Admitting: *Deleted

## 2015-07-09 NOTE — Telephone Encounter (Signed)
Called patient to notify him Dr Rogue Bussing had recommended him for our lung cancer screening program by performing a low dose CT scan . This program is available to people who have smoked at least 1 pack per day for 30 years or that equivalent. Patient states he has only smoked about a 1/4 pack of cigarettes per day for 25 years. He has never smoked more than 10 cigarettes per day, mostly 5 per day. Patient was informed that he does not meet our criteria for the screening program.

## 2015-12-28 ENCOUNTER — Inpatient Hospital Stay: Payer: Medicare Other | Attending: Internal Medicine

## 2015-12-28 ENCOUNTER — Inpatient Hospital Stay: Payer: Medicare Other

## 2016-03-06 ENCOUNTER — Emergency Department: Payer: Medicare Other

## 2016-03-06 ENCOUNTER — Emergency Department
Admission: EM | Admit: 2016-03-06 | Discharge: 2016-03-06 | Disposition: A | Discharge status: Home / Self Care | Payer: Medicare Other | Attending: Emergency Medicine | Admitting: Emergency Medicine

## 2016-03-06 ENCOUNTER — Encounter: Payer: Self-pay | Admitting: Emergency Medicine

## 2016-03-06 DIAGNOSIS — M5442 Lumbago with sciatica, left side: Secondary | ICD-10-CM | POA: Diagnosis not present

## 2016-03-06 DIAGNOSIS — Z794 Long term (current) use of insulin: Secondary | ICD-10-CM | POA: Diagnosis not present

## 2016-03-06 DIAGNOSIS — F17210 Nicotine dependence, cigarettes, uncomplicated: Secondary | ICD-10-CM | POA: Diagnosis not present

## 2016-03-06 DIAGNOSIS — Z79899 Other long term (current) drug therapy: Secondary | ICD-10-CM | POA: Insufficient documentation

## 2016-03-06 DIAGNOSIS — Y929 Unspecified place or not applicable: Secondary | ICD-10-CM | POA: Insufficient documentation

## 2016-03-06 DIAGNOSIS — Y939 Activity, unspecified: Secondary | ICD-10-CM | POA: Diagnosis not present

## 2016-03-06 DIAGNOSIS — E119 Type 2 diabetes mellitus without complications: Secondary | ICD-10-CM | POA: Diagnosis not present

## 2016-03-06 DIAGNOSIS — I10 Essential (primary) hypertension: Secondary | ICD-10-CM | POA: Insufficient documentation

## 2016-03-06 DIAGNOSIS — Z7982 Long term (current) use of aspirin: Secondary | ICD-10-CM | POA: Diagnosis not present

## 2016-03-06 DIAGNOSIS — W010XXA Fall on same level from slipping, tripping and stumbling without subsequent striking against object, initial encounter: Secondary | ICD-10-CM | POA: Insufficient documentation

## 2016-03-06 DIAGNOSIS — S3992XA Unspecified injury of lower back, initial encounter: Secondary | ICD-10-CM | POA: Diagnosis present

## 2016-03-06 DIAGNOSIS — Y999 Unspecified external cause status: Secondary | ICD-10-CM | POA: Diagnosis not present

## 2016-03-06 MED ORDER — ONDANSETRON HCL 4 MG/2ML IJ SOLN
4.0000 mg | Freq: Once | INTRAMUSCULAR | Status: AC
  Administered 2016-03-06: 4 mg via INTRAVENOUS
  Filled 2016-03-06: qty 2

## 2016-03-06 MED ORDER — HYDROMORPHONE HCL 1 MG/ML IJ SOLN
1.0000 mg | Freq: Once | INTRAMUSCULAR | Status: AC
  Administered 2016-03-06: 1 mg via INTRAVENOUS
  Filled 2016-03-06: qty 1

## 2016-03-06 NOTE — Discharge Instructions (Signed)
Return to the ER for any worsening pain, or any numbness, tingling, weakness, or problems urinating or pooping on yourself.

## 2016-03-06 NOTE — ED Triage Notes (Signed)
Pt arrived by EMS from [facility], post mechanical fall. EMS reports pt slipped on floor that was wet, landing on buttocks. Pt is c/o lower back and neck pain. Pt has HX of degenerative disk disease, hypertension and DM. Pt denise LOC and denies hitting head.

## 2016-03-06 NOTE — ED Provider Notes (Signed)
[facility] Emergency Department Provider Note ____________________________________________   I have reviewed the triage vital signs and the triage nursing note.  HISTORY  Chief Complaint Fall   Historian Patient  HPI Corey Foster is a 61 y.o. male with history of degenerative disc disease in his neck and his low back with a history of chronic low back pain and left-sided sciatica, presents today after a slip and fall from standing position and complaining of acute pain down his posterior left leg at his left hip and low back. He also states that his neck feels painful midline. No loss of consciousness. No chest pain. No abdominal pain. Pain is moderate to severe.  He has chronic low back pain for 2 take 20 mg of oxycodone 4 times daily.      Past Medical History:  Diagnosis Date  . Arthritis   . Diabetes mellitus without complication ([place])   . DJD (degenerative joint disease)   . Hypertension   . 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 Left-sided low back pain with left-sided sciatica 11/08/2014  . Lumbar radiculopathy 11/08/2014  . DDD (degenerative disc disease), lumbosacral 11/08/2014  . Hip pain, chronic 11/08/2014  . Sacro-iliac pain 11/08/2014  . Essential hypertension 11/08/2014  . Diabetes mellitus due to underlying condition with diabetic neuropathy ([place]) 11/08/2014  . DDD (degenerative disc disease), cervical 11/08/2014  . 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 Incomplete bladder emptying 05/23/2013  . Elevated prostate specific antigen (PSA) 04/12/2012  . Benign prostatic hyperplasia with urinary obstruction 01/14/2012  . Decreased libido 01/14/2012  . Other long term (current) drug therapy 01/14/2012  . 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 Flu vaccine need 01/14/2012  . Polycythemia vera ([place]) 01/14/2012  . 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 No pertinent surgical history.  Prior to Admission medications   Medication Sig Start Date End Date Taking? Authorizing Provider  albuterol (PROAIR HFA) 108 (90 Base) MCG/ACT inhaler  02/15/13   Historical Provider, MD  ALPRAZolam (XANAX XR) 1 MG 24 hr tablet Take by mouth. 01/14/12   Historical Provider, MD  aspirin EC 81 MG tablet Take 325 mg by mouth.    Historical Provider, MD  baclofen (LIORESAL) 10 MG tablet  10/07/13   Historical Provider, MD  cholecalciferol (VITAMIN D) 400 UNITS TABS tablet Take 5,000 Units by mouth daily.    Historical Provider, MD  doxepin (SINEQUAN) 50 MG capsule  06/21/15   Historical Provider, MD  Flunisolide POWD Inhale into the lungs.    Historical Provider, MD  gemfibrozil (LOPID) 600 MG tablet Take 600 mg by mouth 2 (two) times daily before a meal.    Historical Provider, MD  glyBURIDE (DIABETA) 5 MG tablet Take by mouth. 01/14/12   Historical Provider, MD  insulin glargine (LANTUS) 100 UNIT/ML injection  01/14/12   Historical Provider, MD  Insulin Pen Needle (B-D UF III MINI PEN NEEDLES) 31G X 5 MM MISC  04/28/12   Historical Provider, MD  losartan (COZAAR) 25 MG tablet Take 25 mg by mouth daily.    Historical Provider, MD  metFORMIN (GLUMETZA) 1000 MG (MOD) 24 hr tablet Take 1,000 mg by mouth.    Historical Provider, MD  Omega-3 Fatty Acids (FISH OIL) 1000 MG CAPS  01/14/12   Historical Provider, MD  Oxycodone HCl 20 MG TABS  06/20/15   Historical Provider, MD  sildenafil (REVATIO) 20 MG tablet  03/29/15  Historical Provider, MD  tadalafil (CIALIS) 10 MG tablet Take 10 mg by mouth daily as needed for erectile dysfunction. 4 tabs    Historical Provider, MD  telmisartan (MICARDIS) 20 MG tablet Take 20 mg by mouth.    Historical Provider, MD  Testosterone (ANDROGEL PUMP) 20.25 MG/ACT (1.62%) GEL  07/03/13   Historical Provider, MD  Vitamin D, Ergocalciferol, (DRISDOL) 50000 units CAPS capsule Take by mouth. 01/14/12   Historical Provider, MD    No Known  Allergies  History reviewed. No pertinent family history.  Social History Social History  Substance Use Topics  . Smoking status: Current Every Day Smoker    Packs/day: 0.25    Years: 40.00    Types: Cigarettes  . Smokeless tobacco: Never Used  . Alcohol use No    Review of Systems  Constitutional: Negative for fever. Eyes: Negative for visual changes. ENT: Negative for sore throat. Cardiovascular: Negative for chest pain. Respiratory: Negative for shortness of breath. Gastrointestinal: Negative for abdominal pain, vomiting and diarrhea. Genitourinary: Negative for dysuria. Musculoskeletal: As per history of present illness Skin: Negative for rash. Neurological: Negative for headache. 10 point Review of Systems otherwise negative ____________________________________________   PHYSICAL EXAM:  VITAL SIGNS: ED Triage Vitals  Enc Vitals Group     BP 03/06/16 2012 122/81     Pulse Rate 03/06/16 2012 91     Resp 03/06/16 2012 16     Temp 03/06/16 2012 98.5 F (36.9 C)     Temp Source 03/06/16 2012 Oral     SpO2 03/06/16 2012 96 %     Weight 03/06/16 2009 292 lb (132.5 kg)     Height 03/06/16 2009 5\' 11"  (1.803 m)     Head Circumference --      Peak Flow --      Pain Score 03/06/16 2009 9     Pain Loc --      Pain Edu? --      Excl. in [place]? --      Constitutional: Alert and oriented. Well appearing and in no distress. HEENT   Head: Normocephalic and atraumatic.      Eyes: Conjunctivae are normal. PERRL. Normal extraocular movements.      Ears:         Nose: No congestion/rhinnorhea.   Mouth/Throat: Mucous membranes are moist.   Neck: No stridor. C-collar in place. Short neck with redundant skin tissue and obesity, but reports tenderness Cardiovascular/Chest: Normal rate, regular rhythm.  No murmurs, rubs, or gallops. Respiratory: Normal respiratory effort without tachypnea nor retractions. Breath sounds are clear and equal bilaterally. No  wheezes/rales/rhonchi. Gastrointestinal: Soft. No distention, no guarding, no rebound. Nontender.  Obese  Genitourinary/rectal:Deferred Musculoskeletal: Moderate pain at the left Referred to the posterior buttock. Moderate low back pain. Unable to palpate for any deformity. Neurologic:  Normal speech and language. No gross or focal neurologic deficits are appreciated. Skin:  Skin is warm, dry and intact. No rash noted. Psychiatric: Mood and affect are normal. Speech and behavior are normal. Patient exhibits appropriate insight and judgment.   ____________________________________________  LABS (pertinent positives/negatives)  Labs Reviewed - No data to display  ____________________________________________    EKG I, Lisa Roca, MD, the attending physician have personally viewed and interpreted all ECGs.  None ____________________________________________  RADIOLOGY All Xrays were viewed by me. Imaging interpreted by Radiologist.  Pelvis and left hip:   IMPRESSION: No acute fracture or dislocation.   Lumbar spine:  IMPRESSION: No acute fracture or dislocation.  Ct  cervical spine without contrast: IMPRESSION: No evidence of acute traumatic injury to the cervical spine.  Multilevel osteoarthritic changes.  Osteolytic changes of the superior endplate of C6 and C7 vertebral bodies may represent large Schmorl's nodes or sequela of prior discitis. The changes are grossly stable, accounting for suboptimal image quality. __________________________________________  PROCEDURES  Procedure(s) performed: None  Critical Care performed: None  ____________________________________________   ED COURSE / ASSESSMENT AND PLAN  Pertinent labs & imaging results that were available during my care of the patient were reviewed by me and considered in my medical decision making (see chart for details).   Slipped and fell, feels like he exacerbated his chronic low back pain and left  sided sciatica.  Also will image cspine as he has midline neck pain.   Imaging shows no traumatic findings.  Will provide pain control and discharge when adequate pain relief achieved.    CONSULTATIONS:   None  Patient / Family / Caregiver informed of clinical course, medical decision-making process, and agree with plan.   I discussed return precautions, follow-up instructions, and discharge instructions with patient and/or family.   ___________________________________________   FINAL CLINICAL IMPRESSION(S) / ED DIAGNOSES   Final diagnoses:  Acute back pain with sciatica, left              Note: This dictation was prepared with Dragon dictation. Any transcriptional errors that result from this process are unintentional    Lisa Roca, MD 03/06/16 2337

## 2016-03-17 ENCOUNTER — Emergency Department: Payer: Medicare Other

## 2016-03-17 ENCOUNTER — Encounter: Payer: Self-pay | Admitting: *Deleted

## 2016-03-17 ENCOUNTER — Emergency Department
Admission: EM | Admit: 2016-03-17 | Discharge: 2016-03-17 | Disposition: A | Discharge status: Home / Self Care | Payer: Medicare Other | Attending: Emergency Medicine | Admitting: Emergency Medicine

## 2016-03-17 DIAGNOSIS — F17210 Nicotine dependence, cigarettes, uncomplicated: Secondary | ICD-10-CM | POA: Diagnosis not present

## 2016-03-17 DIAGNOSIS — Z794 Long term (current) use of insulin: Secondary | ICD-10-CM | POA: Insufficient documentation

## 2016-03-17 DIAGNOSIS — E119 Type 2 diabetes mellitus without complications: Secondary | ICD-10-CM | POA: Diagnosis not present

## 2016-03-17 DIAGNOSIS — W010XXA Fall on same level from slipping, tripping and stumbling without subsequent striking against object, initial encounter: Secondary | ICD-10-CM | POA: Insufficient documentation

## 2016-03-17 DIAGNOSIS — S8002XA Contusion of left knee, initial encounter: Secondary | ICD-10-CM

## 2016-03-17 DIAGNOSIS — Y929 Unspecified place or not applicable: Secondary | ICD-10-CM | POA: Insufficient documentation

## 2016-03-17 DIAGNOSIS — S8392XA Sprain of unspecified site of left knee, initial encounter: Secondary | ICD-10-CM | POA: Insufficient documentation

## 2016-03-17 DIAGNOSIS — Y939 Activity, unspecified: Secondary | ICD-10-CM | POA: Diagnosis not present

## 2016-03-17 DIAGNOSIS — Z7982 Long term (current) use of aspirin: Secondary | ICD-10-CM | POA: Diagnosis not present

## 2016-03-17 DIAGNOSIS — S8001XA Contusion of right knee, initial encounter: Secondary | ICD-10-CM

## 2016-03-17 DIAGNOSIS — S8391XA Sprain of unspecified site of right knee, initial encounter: Secondary | ICD-10-CM | POA: Insufficient documentation

## 2016-03-17 DIAGNOSIS — Y999 Unspecified external cause status: Secondary | ICD-10-CM | POA: Diagnosis not present

## 2016-03-17 DIAGNOSIS — I10 Essential (primary) hypertension: Secondary | ICD-10-CM | POA: Insufficient documentation

## 2016-03-17 DIAGNOSIS — S8991XA Unspecified injury of right lower leg, initial encounter: Secondary | ICD-10-CM | POA: Diagnosis present

## 2016-03-17 NOTE — Discharge Instructions (Signed)
Keep appointment with her doctor on Friday. Continue your medication at home. You may use ice as needed for knee pain. Wear the knee immobilizer for support and protection.

## 2016-03-17 NOTE — ED Triage Notes (Signed)
States he tripped and fell in [place] last week and has continued bilateral knee pain

## 2016-03-17 NOTE — ED Notes (Signed)
Knee immobilizer to L knee, has cane and has cruthches at home

## 2016-03-17 NOTE — ED Notes (Signed)
See triage note  States he fell last week  Landed on both knees  conts to have pain

## 2016-03-17 NOTE — ED Provider Notes (Signed)
[facility] Emergency Department Provider Note  ____________________________________________   First MD Initiated Contact with Patient 03/17/16 1030     (approximate)  I have reviewed the triage vital signs and the nursing notes.   HISTORY  Chief Complaint Fall   HPI Corey Foster is a 61 y.o. male is here with complaint of bilateral knee pain. Patient states that he tripped and fell in when days last week and has continued to have pain since that time. Patient states he has degenerative disc disease and has been taking oxycodone not only for his back but also for his knee pain. He states he has very few of the oxycodone left. He sees his pain doctor on Friday. He denies any head injury or loss of consciousness. He has continued to walk since his fall.Left knee hurts worse than the right. He states that neither knee is swollen at this time. His left knee however feels unstable when he is walking. Currently he rates his pain as 7 out of 10.   Past Medical History:  Diagnosis Date  . Arthritis   . Diabetes mellitus without complication ([place])   . DJD (degenerative joint disease)   . Hypertension   . 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 Left-sided low back pain with left-sided sciatica 11/08/2014  . Lumbar radiculopathy 11/08/2014  . DDD (degenerative disc disease), lumbosacral 11/08/2014  . Hip pain, chronic 11/08/2014  . Sacro-iliac pain 11/08/2014  . Essential hypertension 11/08/2014  . Diabetes mellitus due to underlying condition with diabetic neuropathy ([place]) 11/08/2014  . DDD (degenerative disc disease), cervical 11/08/2014  . 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 Incomplete bladder emptying 05/23/2013  . Elevated prostate specific antigen (PSA) 04/12/2012  . Benign prostatic hyperplasia with urinary obstruction 01/14/2012  . Decreased libido 01/14/2012  . Other long term (current) drug  therapy 01/14/2012  . 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 Flu vaccine need 01/14/2012  . Polycythemia vera ([place]) 01/14/2012  . 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 No pertinent surgical history.  Prior to Admission medications   Medication Sig Start Date End Date Taking? Authorizing Provider  albuterol (PROAIR HFA) 108 (90 Base) MCG/ACT inhaler  02/15/13   Historical Provider, MD  ALPRAZolam (XANAX XR) 1 MG 24 hr tablet Take by mouth. 01/14/12   Historical Provider, MD  aspirin EC 81 MG tablet Take 325 mg by mouth.    Historical Provider, MD  baclofen (LIORESAL) 10 MG tablet  10/07/13   Historical Provider, MD  cholecalciferol (VITAMIN D) 400 UNITS TABS tablet Take 5,000 Units by mouth daily.    Historical Provider, MD  doxepin (SINEQUAN) 50 MG capsule  06/21/15   Historical Provider, MD  Flunisolide POWD Inhale into the lungs.    Historical Provider, MD  gemfibrozil (LOPID) 600 MG tablet Take 600 mg by mouth 2 (two) times daily before a meal.    Historical Provider, MD  glyBURIDE (DIABETA) 5 MG tablet Take by mouth. 01/14/12   Historical Provider, MD  insulin glargine (LANTUS) 100 UNIT/ML injection  01/14/12   Historical Provider, MD  Insulin Pen Needle (B-D UF III MINI PEN NEEDLES) 31G X 5 MM MISC  04/28/12   Historical Provider, MD  losartan (COZAAR) 25 MG tablet Take 25 mg by mouth daily.    Historical Provider, MD  metFORMIN (GLUMETZA) 1000 MG (MOD) 24 hr tablet Take 1,000 mg by mouth.    Historical  Provider, MD  Omega-3 Fatty Acids (FISH OIL) 1000 MG CAPS  01/14/12   Historical Provider, MD  Oxycodone HCl 20 MG TABS  06/20/15   Historical Provider, MD  sildenafil (REVATIO) 20 MG tablet  03/29/15   Historical Provider, MD  tadalafil (CIALIS) 10 MG tablet Take 10 mg by mouth daily as needed for erectile dysfunction. 4 tabs    Historical Provider, MD  telmisartan (MICARDIS) 20 MG tablet Take 20 mg by mouth.    Historical Provider, MD  Testosterone  (ANDROGEL PUMP) 20.25 MG/ACT (1.62%) GEL  07/03/13   Historical Provider, MD  Vitamin D, Ergocalciferol, (DRISDOL) 50000 units CAPS capsule Take by mouth. 01/14/12   Historical Provider, MD    Allergies Patient has no known allergies.  History reviewed. No pertinent family history.  Social History Social History  Substance Use Topics  . Smoking status: Current Every Day Smoker    Packs/day: 0.25    Years: 40.00    Types: Cigarettes  . Smokeless tobacco: Never Used  . Alcohol use No    Review of Systems Constitutional: No fever/chills ENT: No sore throat. Cardiovascular: Denies chest pain. Respiratory: Denies shortness of breath. Gastrointestinal:   No nausea, no vomiting.  Musculoskeletal: Positive for chronic back pain. Positive for bilateral knee pain. Skin: Negative for rash. Neurological: Negative for headaches, focal weakness or numbness.  10-point ROS otherwise negative.  ____________________________________________   PHYSICAL EXAM:  VITAL SIGNS: ED Triage Vitals  Enc Vitals Group     BP 03/17/16 0950 127/75     Pulse Rate 03/17/16 0950 91     Resp 03/17/16 0950 18     Temp 03/17/16 0950 98.5 F (36.9 C)     Temp Source 03/17/16 0950 Oral     SpO2 03/17/16 0950 99 %     Weight 03/17/16 0949 292 lb (132.5 kg)     Height 03/17/16 0949 5\' 11"  (1.803 m)     Head Circumference --      Peak Flow --      Pain Score 03/17/16 0949 7     Pain Loc --      Pain Edu? --      Excl. in [place]? --     Constitutional: Alert and oriented. Well appearing and in no acute distress. Head: Atraumatic. Nose: No congestion/rhinnorhea. Neck: No stridor.   Cardiovascular: Normal rate, regular rhythm. Grossly normal heart sounds.  Good peripheral circulation. Respiratory: Normal respiratory effort.  No retractions. Lungs CTAB. Musculoskeletal: Bilateral knees on exam there is no edema or gross deformity noted. Range of motion is minimally restricted. There is tenderness on  palpation of the left knee. No effusion was noted. Ligaments are stable in the right knee and only slightly unstable in the left knee. No abrasions or ecchymosis was noted on exam. Patient was able to stand and bear weight without any assistance in the room. Neurologic:  Normal speech and language. No gross focal neurologic deficits are appreciated. No gait instability. Skin:  Skin is warm, dry and intact. No ecchymosis or abrasions are noted. Psychiatric: Mood and affect are normal. Speech and behavior are normal.  ____________________________________________   LABS (all labs ordered are listed, but only abnormal results are displayed)  Labs Reviewed - No data to display  RADIOLOGY  Left knee x-ray per radiologist is negative for acute injury. I, Johnn Hai, personally viewed and evaluated these images (plain radiographs) as part of my medical decision making, as well as reviewing the written report by  the radiologist. ____________________________________________   PROCEDURES  Procedure(s) performed: None  Procedures  Critical Care performed: No  ____________________________________________   INITIAL IMPRESSION / ASSESSMENT AND PLAN / ED COURSE  Pertinent labs & imaging results that were available during my care of the patient were reviewed by me and considered in my medical decision making (see chart for details).  Left knee was placed in knee immobilizer and patient already has a cane with him that he has been walking with. Patient has an appointment with his primary care doctor on Friday and can discuss pain medication at that time. Patient currently is taking oxycodone as prescribed by his doctor in [place]. Patient's Curtis to use ice and elevation as needed for knee pain. Patient was ambulatory while in the department.   ___________________________________________   FINAL CLINICAL IMPRESSION(S) / ED DIAGNOSES  Final diagnoses:  Knee sprain, bilateral    Contusion of left knee, initial encounter  Contusion of right knee, initial encounter      NEW MEDICATIONS STARTED DURING THIS VISIT:  Discharge Medication List as of 03/17/2016 11:45 AM       Note:  This document was prepared using Dragon voice recognition software and may include unintentional dictation errors.    Johnn Hai, PA-C 03/17/16 [place], MD 03/17/16 1536

## 2016-04-04 ENCOUNTER — Other Ambulatory Visit: Payer: Self-pay | Admitting: Orthopedic Surgery

## 2016-04-04 DIAGNOSIS — M25462 Effusion, left knee: Secondary | ICD-10-CM

## 2016-04-04 DIAGNOSIS — M25662 Stiffness of left knee, not elsewhere classified: Secondary | ICD-10-CM

## 2016-04-04 DIAGNOSIS — M25562 Pain in left knee: Secondary | ICD-10-CM

## 2016-04-22 ENCOUNTER — Ambulatory Visit: Admission: RE | Admit: 2016-04-22 | Payer: Medicare Other | Source: Ambulatory Visit

## 2016-05-07 ENCOUNTER — Ambulatory Visit
Admission: RE | Admit: 2016-05-07 | Discharge: 2016-05-07 | Disposition: A | Discharge status: Home / Self Care | Payer: Medicare Other | Source: Ambulatory Visit | Attending: Orthopedic Surgery | Admitting: Orthopedic Surgery

## 2016-05-07 DIAGNOSIS — M769 Unspecified enthesopathy, lower limb, excluding foot: Secondary | ICD-10-CM | POA: Diagnosis not present

## 2016-05-07 DIAGNOSIS — M25462 Effusion, left knee: Secondary | ICD-10-CM | POA: Insufficient documentation

## 2016-05-07 DIAGNOSIS — M25662 Stiffness of left knee, not elsewhere classified: Secondary | ICD-10-CM | POA: Insufficient documentation

## 2016-05-07 DIAGNOSIS — M25562 Pain in left knee: Secondary | ICD-10-CM | POA: Diagnosis present

## 2016-06-16 ENCOUNTER — Other Ambulatory Visit: Payer: Self-pay | Admitting: Neurological Surgery

## 2016-06-16 DIAGNOSIS — M545 Low back pain: Secondary | ICD-10-CM

## 2016-06-16 DIAGNOSIS — M542 Cervicalgia: Secondary | ICD-10-CM

## 2016-06-26 ENCOUNTER — Ambulatory Visit: Payer: Medicare Other | Admitting: Internal Medicine

## 2016-06-26 ENCOUNTER — Other Ambulatory Visit: Payer: Medicare Other

## 2016-06-27 ENCOUNTER — Inpatient Hospital Stay: Payer: Medicare Other | Admitting: Internal Medicine

## 2016-06-27 ENCOUNTER — Inpatient Hospital Stay: Payer: Medicare Other

## 2016-07-03 ENCOUNTER — Other Ambulatory Visit: Payer: Medicare Other

## 2016-07-10 ENCOUNTER — Ambulatory Visit
Admission: RE | Admit: 2016-07-10 | Discharge: 2016-07-10 | Disposition: A | Discharge status: Home / Self Care | Payer: Medicare Other | Source: Ambulatory Visit | Attending: Neurological Surgery | Admitting: Neurological Surgery

## 2016-07-10 DIAGNOSIS — M542 Cervicalgia: Secondary | ICD-10-CM

## 2016-07-10 DIAGNOSIS — M545 Low back pain: Secondary | ICD-10-CM

## 2016-07-25 ENCOUNTER — Inpatient Hospital Stay (HOSPITAL_BASED_OUTPATIENT_CLINIC_OR_DEPARTMENT_OTHER): Payer: Medicare Other | Admitting: Internal Medicine

## 2016-07-25 ENCOUNTER — Inpatient Hospital Stay: Payer: Medicare Other

## 2016-07-25 ENCOUNTER — Inpatient Hospital Stay: Payer: Medicare Other | Attending: Internal Medicine

## 2016-07-25 VITALS — BP 152/94 | HR 92 | Temp 97.6°F | Resp 18 | Ht 71.0 in | Wt 300.0 lb

## 2016-07-25 DIAGNOSIS — Z794 Long term (current) use of insulin: Secondary | ICD-10-CM | POA: Insufficient documentation

## 2016-07-25 DIAGNOSIS — I10 Essential (primary) hypertension: Secondary | ICD-10-CM | POA: Diagnosis not present

## 2016-07-25 DIAGNOSIS — G4733 Obstructive sleep apnea (adult) (pediatric): Secondary | ICD-10-CM | POA: Insufficient documentation

## 2016-07-25 DIAGNOSIS — F17210 Nicotine dependence, cigarettes, uncomplicated: Secondary | ICD-10-CM

## 2016-07-25 DIAGNOSIS — Z9989 Dependence on other enabling machines and devices: Secondary | ICD-10-CM | POA: Diagnosis not present

## 2016-07-25 DIAGNOSIS — D751 Secondary polycythemia: Secondary | ICD-10-CM | POA: Insufficient documentation

## 2016-07-25 DIAGNOSIS — E669 Obesity, unspecified: Secondary | ICD-10-CM

## 2016-07-25 DIAGNOSIS — Z79899 Other long term (current) drug therapy: Secondary | ICD-10-CM

## 2016-07-25 DIAGNOSIS — M791 Myalgia: Secondary | ICD-10-CM | POA: Diagnosis not present

## 2016-07-25 DIAGNOSIS — M1990 Unspecified osteoarthritis, unspecified site: Secondary | ICD-10-CM | POA: Insufficient documentation

## 2016-07-25 DIAGNOSIS — E119 Type 2 diabetes mellitus without complications: Secondary | ICD-10-CM | POA: Diagnosis not present

## 2016-07-25 DIAGNOSIS — D45 Polycythemia vera: Secondary | ICD-10-CM

## 2016-07-25 DIAGNOSIS — Z9181 History of falling: Secondary | ICD-10-CM | POA: Insufficient documentation

## 2016-07-25 DIAGNOSIS — R5382 Chronic fatigue, unspecified: Secondary | ICD-10-CM

## 2016-07-25 DIAGNOSIS — Z7982 Long term (current) use of aspirin: Secondary | ICD-10-CM

## 2016-07-25 LAB — CBC WITH DIFFERENTIAL/PLATELET
Basophils Absolute: 0.1 10*3/uL (ref 0–0.1)
Basophils Relative: 1 %
Eosinophils Absolute: 0.1 10*3/uL (ref 0–0.7)
Eosinophils Relative: 1 %
HCT: 44.2 % (ref 40.0–52.0)
Hemoglobin: 15.6 g/dL (ref 13.0–18.0)
Lymphocytes Relative: 25 %
Lymphs Abs: 2 10*3/uL (ref 1.0–3.6)
MCH: 33.2 pg (ref 26.0–34.0)
MCHC: 35.3 g/dL (ref 32.0–36.0)
MCV: 94.2 fL (ref 80.0–100.0)
Monocytes Absolute: 0.8 10*3/uL (ref 0.2–1.0)
Monocytes Relative: 10 %
Neutro Abs: 5.3 10*3/uL (ref 1.4–6.5)
Neutrophils Relative %: 63 %
Platelets: 206 10*3/uL (ref 150–440)
RBC: 4.69 MIL/uL (ref 4.40–5.90)
RDW: 13.4 % (ref 11.5–14.5)
WBC: 8.3 10*3/uL (ref 3.8–10.6)

## 2016-07-25 NOTE — Assessment & Plan Note (Addendum)
#   Secondary erythrocytosis-recommend phlebotomy The hematocrit is greater than 50. Patient has been needing phlebotomy every 12 months or so. Today hematocrit is 44 [no phlebotomy; not too keen].  # smoking-counseled the patient to quit smoking. not interested in quitting smoking.  # OSA- sleep apnea machine- stable.  # Lung cancer screening: pt interested. We will have nurse navigator to get contact patient.  # follow-up with me in one year H&H and possible phlebotomy. 

## 2016-07-25 NOTE — Progress Notes (Signed)
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   Unfortunately continues to smoke. Obstructive sleep apnea on CPAP. Denies any worsening headaches. Chronic mild fatigue. He recently fell - complaints of aches and pains in his body.  Otherwise denies any chest pain or shortness of breath or cough. Denies any unusual leg swelling. Denies any strokes.   REVIEW OF SYSTEMS:  A complete 10 point review of system is done which is negative except mentioned above/history of present illness.   PAST MEDICAL HISTORY :  Past Medical History:  Diagnosis Date  . Arthritis   . Diabetes mellitus without complication ([place])   . DJD (degenerative joint disease)   . Hypertension   . 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  FAMILY HISTORY :  No family history on file.  SOCIAL HISTORY:   Social History  Substance Use Topics  . Smoking status: Current Every Day Smoker    Packs/day: 0.25    Years: 40.00    Types: Cigarettes  . Smokeless tobacco: Never Used  . Alcohol use No    ALLERGIES:  has No Known Allergies.  MEDICATIONS:  Current Outpatient Prescriptions  Medication Sig Dispense Refill  . albuterol (PROAIR HFA) 108 (90 Base) MCG/ACT inhaler Inhale 1 puff into the lungs every 6 (six) hours as needed for wheezing or shortness of breath.     . ALPRAZolam (XANAX XR) 1 MG 24 hr tablet Take 1 mg by mouth daily.     [name] aspirin EC 81 MG tablet Take 325 mg by mouth.    . baclofen (LIORESAL) 10 MG tablet Take 10 mg by mouth  2 (two) times daily.     . cholecalciferol (VITAMIN D) 400 UNITS TABS tablet Take 10,000 Units by mouth daily.     [name] glyBURIDE (DIABETA) 5 MG tablet Take 5 mg by mouth 2 (two) times daily with a meal.     . insulin glargine (LANTUS) 100 UNIT/ML injection Inject 80 Units into the skin 2 (two) times daily.     . Insulin Pen Needle (B-D UF III MINI PEN NEEDLES) 31G X 5 MM MISC     . losartan (COZAAR) 25 MG tablet Take 25 mg by mouth daily.    . metFORMIN (GLUMETZA) 1000 MG (MOD) 24 hr tablet Take 1,000 mg by mouth 2 (two) times daily with a meal.     . Omega-3 Fatty Acids (FISH OIL) 1000 MG CAPS Take 1 capsule by mouth daily.     . Oxycodone HCl 20 MG TABS Take 20 mg by mouth 4 (four) times daily as needed (pain).     . sildenafil (REVATIO) 20 MG tablet Take 20 mg by mouth as needed (hypertension).     . simvastatin (ZOCOR) 40 MG tablet Take 1 tablet by mouth daily.    . Testosterone (ANDROGEL PUMP) 20.25 MG/ACT (1.62%) GEL Apply 2 application topically daily.     . tadalafil (CIALIS) 10 MG tablet Take 10 mg by mouth daily as needed for erectile dysfunction. 4 tabs     No  current facility-administered medications for this visit.     PHYSICAL EXAMINATION:  BP (!) 152/94 (BP Location: Left Arm, Patient Position: Sitting)   Pulse 92   Temp 97.6 F (36.4 C) (Tympanic)   Resp 18   Ht 5\' 11"  (1.803 m)   Wt 300 lb (136.1 kg)   BMI 41.84 kg/m   Filed Weights   07/25/16 1127  Weight: 300 lb (136.1 kg)    GENERAL: Well-nourished well-developed; Alert, no distress and comfortable.  Obese.  EYES: no pallor or icterus OROPHARYNX: no thrush or ulceration; good dentition  NECK: supple, no masses felt LYMPH:  no palpable lymphadenopathy in the cervical, axillary or inguinal regions LUNGS: clear to auscultation and  No wheeze or crackles HEART/CVS: regular rate & rhythm and no murmurs; No lower extremity edema ABDOMEN:abdomen soft, non-tender and normal bowel sounds Musculoskeletal:no cyanosis  of digits and no clubbing  PSYCH: alert & oriented x 3 with fluent speech NEURO: no focal motor/sensory deficits SKIN:  no rashes or significant lesions  LABORATORY DATA:  I have reviewed the data as listed No results found for: NA, K, CL, CO2, GLUCOSE, BUN, CREATININE, CALCIUM, PROT, ALBUMIN, AST, ALT, ALKPHOS, BILITOT, GFRNONAA, GFRAA  No results found for: SPEP, UPEP  Lab Results  Component Value Date   WBC 8.3 07/25/2016   NEUTROABS 5.3 07/25/2016   HGB 15.6 07/25/2016   HCT 44.2 07/25/2016   MCV 94.2 07/25/2016   PLT 206 07/25/2016      Chemistry   No results found for: NA, K, CL, CO2, BUN, CREATININE, GLU No results found for: CALCIUM, ALKPHOS, AST, ALT, BILITOT     ASSESSMENT & PLAN:   # Secondary erythrocytosis-recommend phlebotomy The hematocrit is greater than 50. Patient has been needing phlebotomy every 6 months or so. Today hematocrit is 53. Plan phlebotomy.  # smoking-counseled the patient to quit smoking. not interested in quitting smoking.  # Lung cancer screening: pt interested. We will have nurse navigator to get contact patient.  All questions were answered. The patient knows to call the clinic with any problems, questions or concerns.   # Patient follow-up with H&H in 6 months possible phlebotomy;  follow-up with me in one year H&H and possible phlebotomy.     Cammie Sickle, MD 07/25/2016 12:13 PM

## 2016-07-28 ENCOUNTER — Telehealth: Payer: Self-pay | Admitting: *Deleted

## 2016-07-28 NOTE — Telephone Encounter (Signed)
Received referral for low dose lung cancer screening CT scan. Message left at phone number listed in EMR for patient to call me back to facilitate scheduling scan.  

## 2016-07-30 ENCOUNTER — Telehealth: Payer: Self-pay | Admitting: *Deleted

## 2016-07-30 NOTE — Telephone Encounter (Signed)
Received referral for initial lung cancer screening scan. Contacted patient and obtained smoking history. Due to 20 pack year smoking history patient does not meet eligibility criteria at this time. Encouraged to quit smoking and given resources available here at [facility]. Also, informed if he continues to smoke that he may be eligible in the future.

## 2017-02-13 ENCOUNTER — Telehealth: Payer: Self-pay | Admitting: *Deleted

## 2017-02-13 NOTE — Telephone Encounter (Signed)
Reviewed chart- pt's hgb in Dr. Bjorn Loser office was 58. Pt can be evaluated by symptom mgmt np one day next week-with possible phlebotomy. msg sent back to scheduling team to arrange for apts.

## 2017-02-23 ENCOUNTER — Inpatient Hospital Stay: Payer: Medicare Other | Attending: Nurse Practitioner | Admitting: Nurse Practitioner

## 2017-02-23 ENCOUNTER — Encounter: Payer: Self-pay | Admitting: Nurse Practitioner

## 2017-02-23 ENCOUNTER — Inpatient Hospital Stay: Payer: Medicare Other

## 2017-02-23 ENCOUNTER — Other Ambulatory Visit: Payer: Self-pay

## 2017-02-23 VITALS — BP 132/90 | HR 93

## 2017-02-23 DIAGNOSIS — F17210 Nicotine dependence, cigarettes, uncomplicated: Secondary | ICD-10-CM | POA: Insufficient documentation

## 2017-02-23 DIAGNOSIS — D45 Polycythemia vera: Secondary | ICD-10-CM

## 2017-02-23 DIAGNOSIS — Z7982 Long term (current) use of aspirin: Secondary | ICD-10-CM | POA: Diagnosis not present

## 2017-02-23 DIAGNOSIS — D751 Secondary polycythemia: Secondary | ICD-10-CM

## 2017-02-23 DIAGNOSIS — Z87828 Personal history of other (healed) physical injury and trauma: Secondary | ICD-10-CM | POA: Insufficient documentation

## 2017-02-23 DIAGNOSIS — G8929 Other chronic pain: Secondary | ICD-10-CM | POA: Diagnosis not present

## 2017-02-23 DIAGNOSIS — G4733 Obstructive sleep apnea (adult) (pediatric): Secondary | ICD-10-CM | POA: Diagnosis not present

## 2017-02-23 DIAGNOSIS — Z79899 Other long term (current) drug therapy: Secondary | ICD-10-CM | POA: Diagnosis not present

## 2017-02-23 DIAGNOSIS — Z6841 Body Mass Index (BMI) 40.0 and over, adult: Secondary | ICD-10-CM | POA: Insufficient documentation

## 2017-02-23 DIAGNOSIS — M791 Myalgia, unspecified site: Secondary | ICD-10-CM | POA: Insufficient documentation

## 2017-02-23 DIAGNOSIS — Z9181 History of falling: Secondary | ICD-10-CM

## 2017-02-23 DIAGNOSIS — Z794 Long term (current) use of insulin: Secondary | ICD-10-CM | POA: Diagnosis not present

## 2017-02-23 DIAGNOSIS — N529 Male erectile dysfunction, unspecified: Secondary | ICD-10-CM | POA: Insufficient documentation

## 2017-02-23 DIAGNOSIS — E291 Testicular hypofunction: Secondary | ICD-10-CM | POA: Diagnosis not present

## 2017-02-23 DIAGNOSIS — E119 Type 2 diabetes mellitus without complications: Secondary | ICD-10-CM | POA: Insufficient documentation

## 2017-02-23 DIAGNOSIS — I10 Essential (primary) hypertension: Secondary | ICD-10-CM | POA: Insufficient documentation

## 2017-02-23 DIAGNOSIS — M1990 Unspecified osteoarthritis, unspecified site: Secondary | ICD-10-CM | POA: Diagnosis not present

## 2017-02-23 DIAGNOSIS — Z9989 Dependence on other enabling machines and devices: Secondary | ICD-10-CM | POA: Insufficient documentation

## 2017-02-23 DIAGNOSIS — R5382 Chronic fatigue, unspecified: Secondary | ICD-10-CM | POA: Insufficient documentation

## 2017-02-23 DIAGNOSIS — F17200 Nicotine dependence, unspecified, uncomplicated: Secondary | ICD-10-CM

## 2017-02-23 LAB — CBC WITH DIFFERENTIAL/PLATELET
Basophils Absolute: 0 10*3/uL (ref 0–0.1)
Basophils Relative: 1 %
Eosinophils Absolute: 0 10*3/uL (ref 0–0.7)
Eosinophils Relative: 1 %
HCT: 49.2 % (ref 40.0–52.0)
Hemoglobin: 16.8 g/dL (ref 13.0–18.0)
Lymphocytes Relative: 21 %
Lymphs Abs: 1.7 10*3/uL (ref 1.0–3.6)
MCH: 32.8 pg (ref 26.0–34.0)
MCHC: 34.2 g/dL (ref 32.0–36.0)
MCV: 95.8 fL (ref 80.0–100.0)
Monocytes Absolute: 0.9 10*3/uL (ref 0.2–1.0)
Monocytes Relative: 10 %
Neutro Abs: 5.7 10*3/uL (ref 1.4–6.5)
Neutrophils Relative %: 67 %
Platelets: 188 10*3/uL (ref 150–440)
RBC: 5.14 MIL/uL (ref 4.40–5.90)
RDW: 14.2 % (ref 11.5–14.5)
WBC: 8.4 10*3/uL (ref 3.8–10.6)
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
## 2017-03-02 ENCOUNTER — Encounter: Payer: Self-pay | Admitting: Nurse Practitioner

## 2017-03-02 DIAGNOSIS — IMO0001 Reserved for inherently not codable concepts without codable children: Secondary | ICD-10-CM | POA: Insufficient documentation

## 2017-03-02 DIAGNOSIS — Z6841 Body Mass Index (BMI) 40.0 and over, adult: Secondary | ICD-10-CM | POA: Insufficient documentation

## 2017-03-02 DIAGNOSIS — F17200 Nicotine dependence, unspecified, uncomplicated: Secondary | ICD-10-CM | POA: Insufficient documentation

## 2017-03-30 ENCOUNTER — Inpatient Hospital Stay: Payer: 59 | Admitting: Internal Medicine

## 2017-03-30 ENCOUNTER — Inpatient Hospital Stay: Payer: 59

## 2017-03-30 NOTE — Assessment & Plan Note (Deleted)
#   Secondary erythrocytosis-recommend phlebotomy The hematocrit is greater than 50. Patient has been needing phlebotomy every 12 months or so. Today hematocrit is 44 [no phlebotomy; not too keen].  # smoking-counseled the patient to quit smoking. not interested in quitting smoking.  # OSA- sleep apnea machine- stable.  # Lung cancer screening: pt interested. We will have nurse navigator to get contact patient.  # follow-up with me in one year H&H and possible phlebotomy.

## 2017-03-30 NOTE — Progress Notes (Deleted)
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   Unfortunately continues to smoke. Obstructive sleep apnea on CPAP. Denies any worsening headaches. Chronic mild fatigue. He recently fell - complaints of aches and pains in his body.  Otherwise denies any chest pain or shortness of breath or cough. Denies any unusual leg swelling. Denies any strokes.   REVIEW OF SYSTEMS:  A complete 10 point review of system is done which is negative except mentioned above/history of present illness.   PAST MEDICAL HISTORY :  Past Medical History:  Diagnosis Date  . Arthritis   . Diabetes mellitus without complication ([place])   . DJD (degenerative joint disease)   . Hypertension   . 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  FAMILY HISTORY :  No family history on file.  SOCIAL HISTORY:   Social History   Tobacco Use  . Smoking status: Current Every Day Smoker    Packs/day: 0.25    Years: 40.00    Pack years: 10.00    Types: Cigarettes  . Smokeless tobacco: Never Used  Substance Use Topics  . Alcohol use: No    Alcohol/week: 0.0 oz  . Drug use: No    ALLERGIES:  has No Known Allergies.  MEDICATIONS:  Current Outpatient Medications  Medication Sig Dispense Refill  . albuterol (PROAIR HFA) 108 (90 Base) MCG/ACT inhaler Inhale 1 puff into the lungs every 6 (six) hours as needed for wheezing or shortness of breath.     . ALPRAZolam (XANAX XR) 1 MG 24 hr tablet Take 1 mg by mouth daily.     [name] aspirin EC 81 MG tablet  Take 325 mg by mouth.    . baclofen (LIORESAL) 10 MG tablet Take 10 mg by mouth 2 (two) times daily.     . cholecalciferol (VITAMIN D) 400 UNITS TABS tablet Take 10,000 Units by mouth daily.     [name] glyBURIDE (DIABETA) 5 MG tablet Take 5 mg by mouth 2 (two) times daily with a meal.     . insulin glargine (LANTUS) 100 UNIT/ML injection Inject 80 Units into the skin 2 (two) times daily.     . Insulin Pen Needle (B-D UF III MINI PEN NEEDLES) 31G X 5 MM MISC     . losartan (COZAAR) 25 MG tablet Take 25 mg by mouth daily.    . metFORMIN (GLUMETZA) 1000 MG (MOD) 24 hr tablet Take 1,000 mg by mouth 2 (two) times daily with a meal.     . Omega-3 Fatty Acids (FISH OIL) 1000 MG CAPS Take 1 capsule by mouth daily.     . Oxycodone HCl 20 MG TABS Take 20 mg by mouth 4 (four) times daily as needed (pain).     . sildenafil (REVATIO) 20 MG tablet Take 20 mg by mouth as needed (hypertension).     . simvastatin (ZOCOR) 40 MG tablet Take 1 tablet by mouth daily.    . tadalafil (CIALIS) 10 MG tablet Take 10 mg by mouth daily as needed for erectile dysfunction. 4 tabs    .  Testosterone (ANDROGEL PUMP) 20.25 MG/ACT (1.62%) GEL Apply 2 application topically daily.      No current facility-administered medications for this visit.     PHYSICAL EXAMINATION:  There were no vitals taken for this visit.  There were no vitals filed for this visit.  GENERAL: Well-nourished well-developed; Alert, no distress and comfortable.  Obese.  EYES: no pallor or icterus OROPHARYNX: no thrush or ulceration; good dentition  NECK: supple, no masses felt LYMPH:  no palpable lymphadenopathy in the cervical, axillary or inguinal regions LUNGS: clear to auscultation and  No wheeze or crackles HEART/CVS: regular rate & rhythm and no murmurs; No lower extremity edema ABDOMEN:abdomen soft, non-tender and normal bowel sounds Musculoskeletal:no cyanosis of digits and no clubbing  PSYCH: alert & oriented x 3 with fluent speech NEURO: no  focal motor/sensory deficits SKIN:  no rashes or significant lesions  LABORATORY DATA:  I have reviewed the data as listed No results found for: NA, K, CL, CO2, GLUCOSE, BUN, CREATININE, CALCIUM, PROT, ALBUMIN, AST, ALT, ALKPHOS, BILITOT, GFRNONAA, GFRAA  No results found for: SPEP, UPEP  Lab Results  Component Value Date   WBC 8.4 02/23/2017   NEUTROABS 5.7 02/23/2017   HGB 16.8 02/23/2017   HCT 49.2 02/23/2017   MCV 95.8 02/23/2017   PLT 188 02/23/2017      Chemistry   No results found for: NA, K, CL, CO2, BUN, CREATININE, GLU No results found for: CALCIUM, ALKPHOS, AST, ALT, BILITOT     ASSESSMENT & PLAN:   # Secondary erythrocytosis-recommend phlebotomy The hematocrit is greater than 50. Patient has been needing phlebotomy every 6 months or so. Today hematocrit is 53. Plan phlebotomy.  # smoking-counseled the patient to quit smoking. not interested in quitting smoking.  # Lung cancer screening: pt interested. We will have nurse navigator to get contact patient.  All questions were answered. The patient knows to call the clinic with any problems, questions or concerns.   # Patient follow-up with H&H in 6 months possible phlebotomy;  follow-up with me in one year H&H and possible phlebotomy.     Cammie Sickle, MD 03/30/2017 8:20 AM

## 2017-04-15 ENCOUNTER — Inpatient Hospital Stay: Payer: 59

## 2017-04-15 ENCOUNTER — Inpatient Hospital Stay (HOSPITAL_BASED_OUTPATIENT_CLINIC_OR_DEPARTMENT_OTHER): Payer: 59 | Admitting: Internal Medicine

## 2017-04-15 ENCOUNTER — Other Ambulatory Visit: Payer: Self-pay

## 2017-04-15 ENCOUNTER — Inpatient Hospital Stay: Payer: 59 | Attending: Internal Medicine

## 2017-04-15 VITALS — BP 136/83 | HR 94 | Temp 98.1°F | Resp 20 | Wt 303.0 lb

## 2017-04-15 DIAGNOSIS — E119 Type 2 diabetes mellitus without complications: Secondary | ICD-10-CM | POA: Insufficient documentation

## 2017-04-15 DIAGNOSIS — I10 Essential (primary) hypertension: Secondary | ICD-10-CM

## 2017-04-15 DIAGNOSIS — G4733 Obstructive sleep apnea (adult) (pediatric): Secondary | ICD-10-CM | POA: Insufficient documentation

## 2017-04-15 DIAGNOSIS — F17210 Nicotine dependence, cigarettes, uncomplicated: Secondary | ICD-10-CM | POA: Insufficient documentation

## 2017-04-15 DIAGNOSIS — D751 Secondary polycythemia: Secondary | ICD-10-CM

## 2017-04-15 DIAGNOSIS — D45 Polycythemia vera: Secondary | ICD-10-CM

## 2017-04-15 LAB — CBC WITH DIFFERENTIAL/PLATELET
Basophils Absolute: 0 10*3/uL (ref 0–0.1)
Basophils Relative: 0 %
Eosinophils Absolute: 0.1 10*3/uL (ref 0–0.7)
Eosinophils Relative: 1 %
HCT: 47.5 % (ref 40.0–52.0)
Hemoglobin: 16.5 g/dL (ref 13.0–18.0)
Lymphocytes Relative: 20 %
Lymphs Abs: 1.4 10*3/uL (ref 1.0–3.6)
MCH: 33 pg (ref 26.0–34.0)
MCHC: 34.7 g/dL (ref 32.0–36.0)
MCV: 95.3 fL (ref 80.0–100.0)
Monocytes Absolute: 0.7 10*3/uL (ref 0.2–1.0)
Monocytes Relative: 10 %
Neutro Abs: 4.9 10*3/uL (ref 1.4–6.5)
Neutrophils Relative %: 69 %
Platelets: 196 10*3/uL (ref 150–440)
RBC: 4.98 MIL/uL (ref 4.40–5.90)
RDW: 13.8 % (ref 11.5–14.5)
WBC: 7 10*3/uL (ref 3.8–10.6)

## 2017-04-15 NOTE — Progress Notes (Signed)
Here for follow up. Stated thought he was having phlebotomy vs seeing Dr .

## 2017-04-15 NOTE — Progress Notes (Signed)
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   Patient denies any chest pain or shortness of breath or cough. Denies any unusual leg swelling. Denies any strokes.   REVIEW OF SYSTEMS:  A complete 10 point review of system is done which is negative except mentioned above/history of present illness.   PAST MEDICAL HISTORY :  Past Medical History:  Diagnosis Date  . Arthritis   . Diabetes mellitus without complication ([place])   . DJD (degenerative joint disease)   . Hypertension   . 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  FAMILY HISTORY :  No family history on file.  SOCIAL HISTORY:   Social History   Tobacco Use  . Smoking status: Current Every Day Smoker    Packs/day: 0.25    Years: 40.00    Pack years: 10.00    Types: Cigarettes  . Smokeless tobacco: Never Used  Substance Use Topics  . Alcohol use: No    Alcohol/week: 0.0 oz  . Drug use: No    ALLERGIES:  has No Known Allergies.  MEDICATIONS:  Current Outpatient Medications  Medication Sig Dispense Refill  . ALPRAZolam (XANAX XR) 1 MG 24 hr tablet Take 1 mg by mouth daily.     [name] aspirin EC 81 MG tablet Take 325 mg by mouth.    . baclofen (LIORESAL) 10 MG tablet Take 10 mg by mouth 2 (two) times daily.     . cholecalciferol (VITAMIN D) 400 UNITS TABS tablet Take 10,000 Units by mouth daily.     [name] glipiZIDE (GLUCOTROL XL) 10 MG 24 hr tablet Take 10 mg by mouth daily with breakfast.     . glyBURIDE (DIABETA) 5 MG tablet Take  5 mg by mouth 2 (two) times daily with a meal.     . insulin glargine (LANTUS) 100 UNIT/ML injection Inject 80 Units into the skin daily.     . Insulin Pen Needle (B-D UF III MINI PEN NEEDLES) 31G X 5 MM MISC     . losartan (COZAAR) 25 MG tablet Take 25 mg by mouth daily.    . metFORMIN (GLUMETZA) 1000 MG (MOD) 24 hr tablet Take 1,000 mg by mouth 2 (two) times daily with a meal.     . Omega-3 Fatty Acids (FISH OIL) 1000 MG CAPS Take 1 capsule by mouth daily.     . Oxycodone HCl 20 MG TABS Take 20 mg by mouth 4 (four) times daily as needed (pain).     . simvastatin (ZOCOR) 40 MG tablet Take 1 tablet by mouth daily.    . Testosterone (ANDROGEL PUMP) 20.25 MG/ACT (1.62%) GEL Apply 2 application topically daily.     [name] albuterol (PROAIR HFA) 108 (90 Base) MCG/ACT inhaler Inhale 1 puff into the lungs every 6 (six) hours as needed for wheezing or shortness of breath.     [name] amLODipine (NORVASC) 10 MG tablet     . sildenafil (REVATIO) 20 MG tablet Take 20 mg by mouth as needed (hypertension).     . tadalafil (CIALIS) 10  MG tablet Take 10 mg by mouth daily as needed for erectile dysfunction. 4 tabs     No current facility-administered medications for this visit.     PHYSICAL EXAMINATION:  BP 136/83 (BP Location: Right Arm, Patient Position: Sitting)   Pulse 94   Temp 98.1 F (36.7 C) (Tympanic)   Resp 20   Wt (!) 303 lb (137.4 kg)   BMI 42.26 kg/m   Filed Weights   04/15/17 1506  Weight: (!) 303 lb (137.4 kg)    GENERAL: Well-nourished well-developed; Alert, no distress and comfortable.  Obese.  EYES: no pallor or icterus OROPHARYNX: no thrush or ulceration; good dentition  NECK: supple, no masses felt LYMPH:  no palpable lymphadenopathy in the cervical, axillary or inguinal regions LUNGS: clear to auscultation and  No wheeze or crackles HEART/CVS: regular rate & rhythm and no murmurs; No lower extremity edema ABDOMEN:abdomen soft, non-tender and normal bowel sounds Musculoskeletal:no  cyanosis of digits and no clubbing  PSYCH: alert & oriented x 3 with fluent speech NEURO: no focal motor/sensory deficits SKIN:  no rashes or significant lesions  LABORATORY DATA:  I have reviewed the data as listed No results found for: NA, K, CL, CO2, GLUCOSE, BUN, CREATININE, CALCIUM, PROT, ALBUMIN, AST, ALT, ALKPHOS, BILITOT, GFRNONAA, GFRAA  No results found for: SPEP, UPEP  Lab Results  Component Value Date   WBC 7.0 04/15/2017   NEUTROABS 4.9 04/15/2017   HGB 16.5 04/15/2017   HCT 47.5 04/15/2017   MCV 95.3 04/15/2017   PLT 196 04/15/2017      Chemistry   No results found for: NA, K, CL, CO2, BUN, CREATININE, GLU No results found for: CALCIUM, ALKPHOS, AST, ALT, BILITOT     ASSESSMENT & PLAN:   Secondary erythrocytosis # Secondary erythrocytosis-recommend phlebotomy The hematocrit is greater than 50. Patient has been needing phlebotomy every 12 months or so. Today hematocrit is 44 [no phlebotomy; not too keen].  # smoking-counseled the patient to quit smoking. not interested in quitting smoking.  # OSA- sleep apnea machine- stable.  # Lung cancer screening: pt interested. NEG-  # follow-up with me in6 months-cbc; possible phlebotomy.       Cammie Sickle, MD 04/18/2017 11:24 PM

## 2017-04-15 NOTE — Assessment & Plan Note (Addendum)
#   Secondary erythrocytosis-recommend phlebotomy The hematocrit is greater than 50. Patient has been needing phlebotomy every 6 months or so. Today hematocrit is 47.5.HOLD phlebotomy.  # smoking-counseled the patient to quit smoking. not interested in quitting smoking.  # Patient follow-up with H&H in 6 months possible phlebotomy

## 2017-05-07 IMAGING — CT CT CERVICAL SPINE W/O CM
3 of 4 series · 13 of 33 positions shown, 16 images · non-contrast
Comparison: 09/25/2013

CLINICAL DATA: Neck pain status post fall.

EXAM:
CT CERVICAL SPINE WITHOUT CONTRAST
TECHNIQUE: Multidetector CT imaging of the cervical spine was performed without
intravenous contrast. Multiplanar CT image reconstructions were also
generated.

[Series 4: sagittal bone · sagittal · 0.31mm/px · 5 of 61 slices shown, 6 images]
[im 21/61  bone]
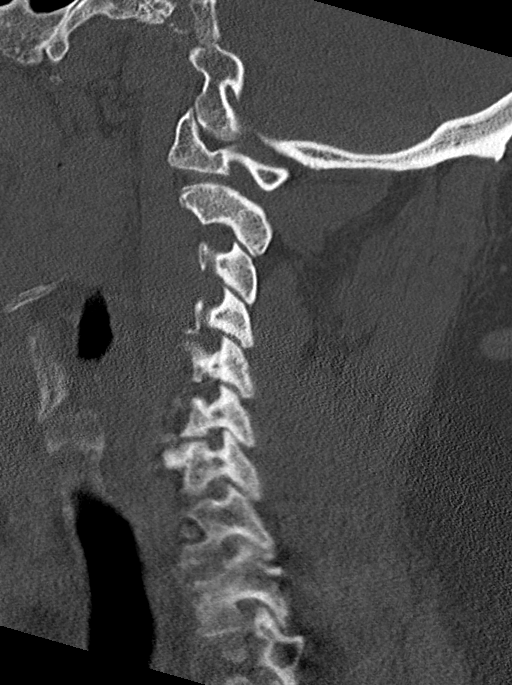
[im 26/61  bone]
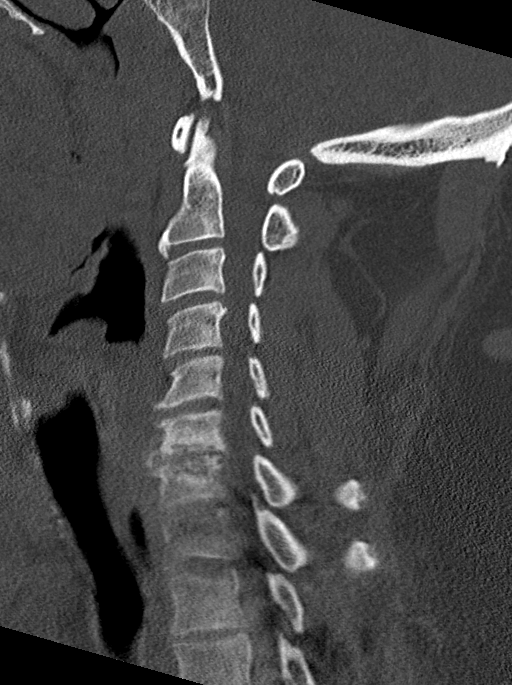
[im 31/61  soft-tissue]
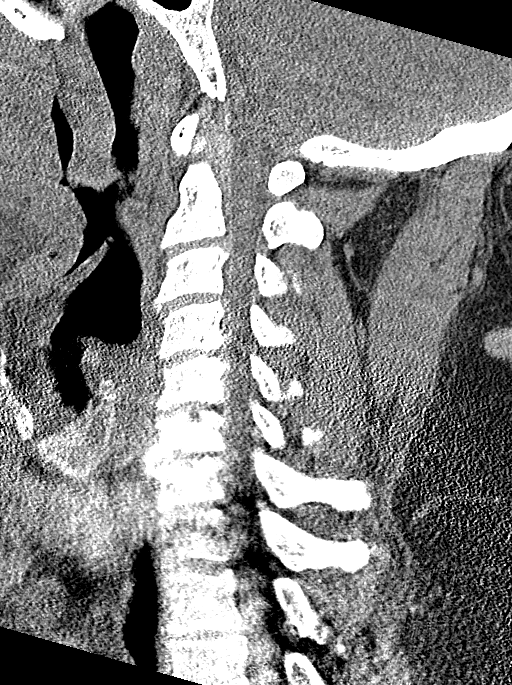
[im 31/61  bone]
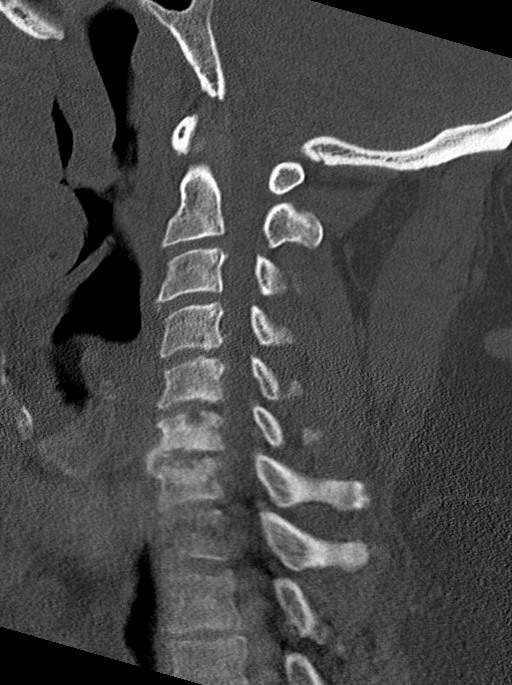
[im 36/61  bone]
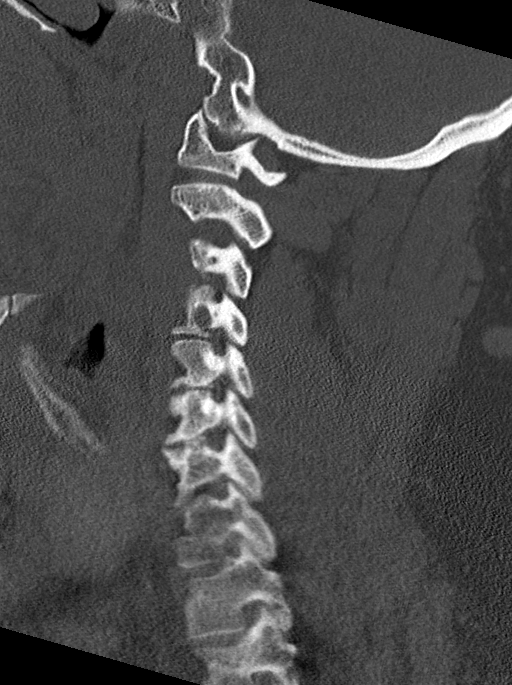
[im 41/61  bone]
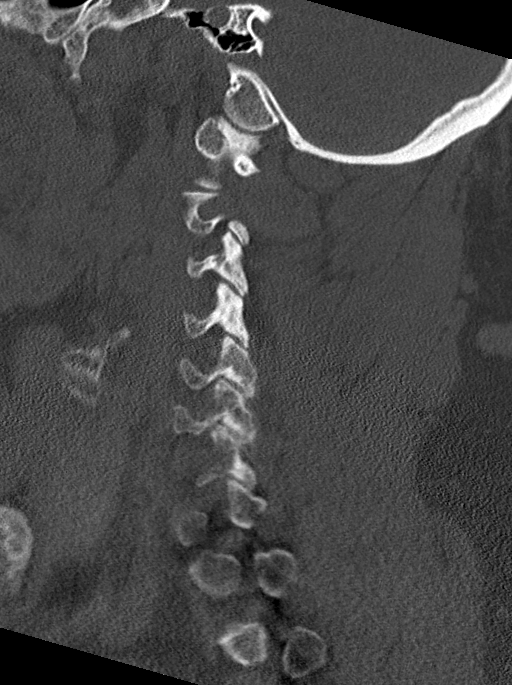

[Series 5: coronal bone · coronal · 0.31mm/px · 3 of 62 slices shown]
[im 13/62  bone]
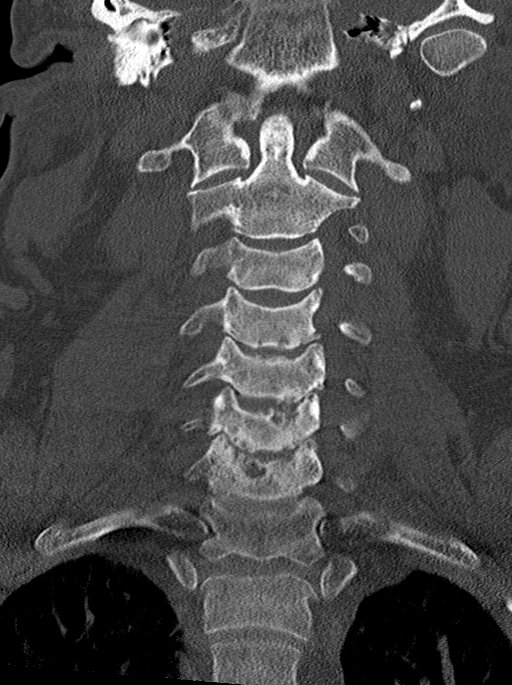
[im 25/62  bone]
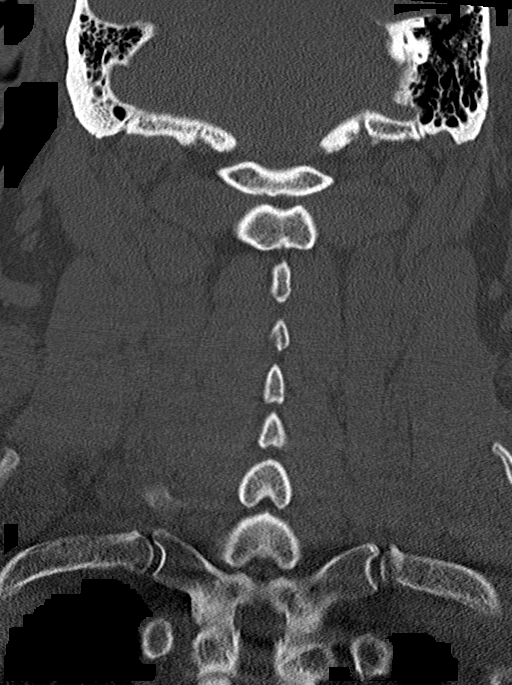
[im 37/62  bone]
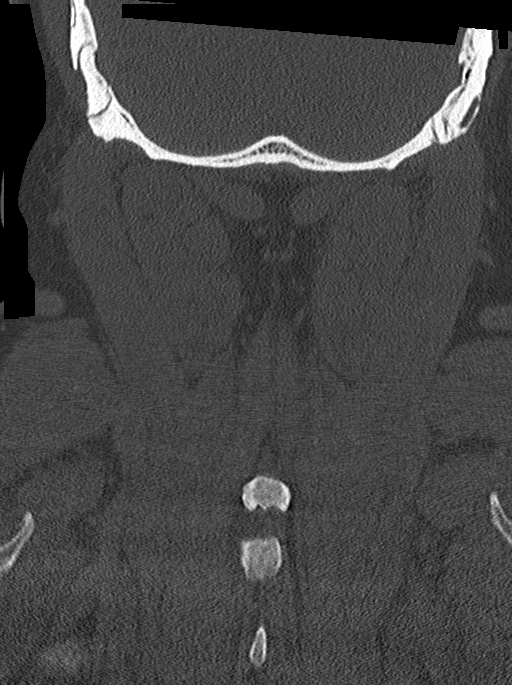

[Series 6: orthogonal bone · axial · 0.25mm/px · z∈[-335,-197]mm · 5 of 109 slices shown, 7 images]
[im 19/109  soft-tissue]
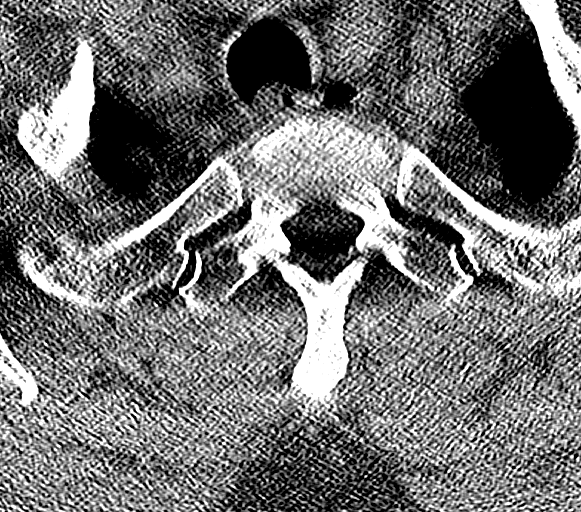
[im 19/109  bone]
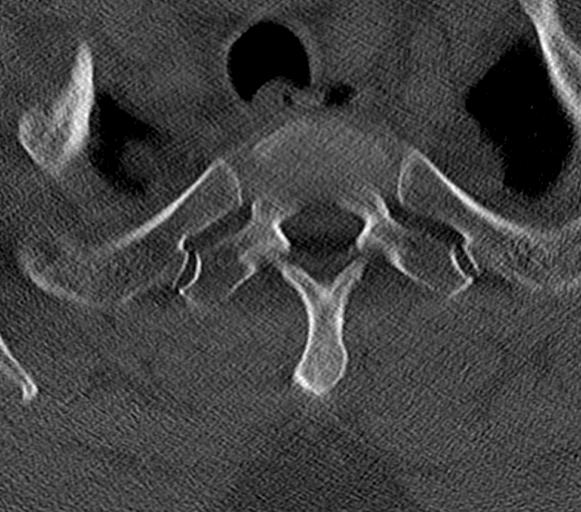
[im 37/109  bone]
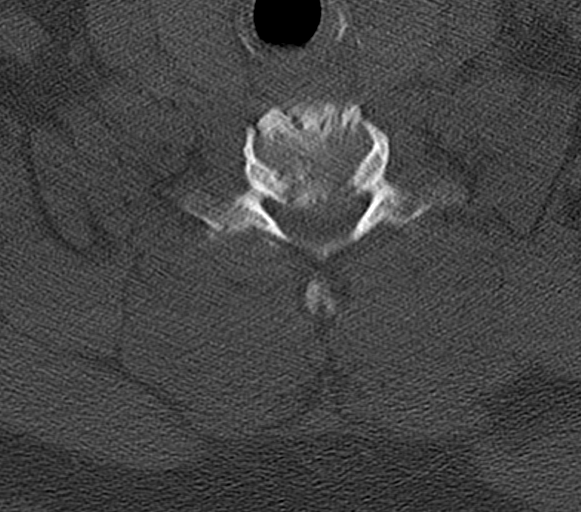
[im 55/109  bone]
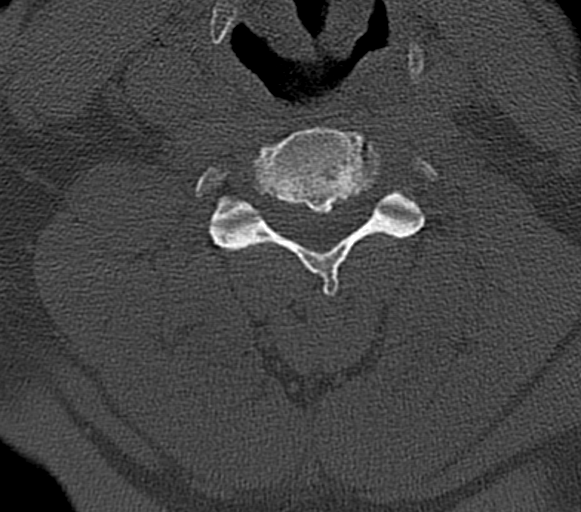
[im 73/109  bone]
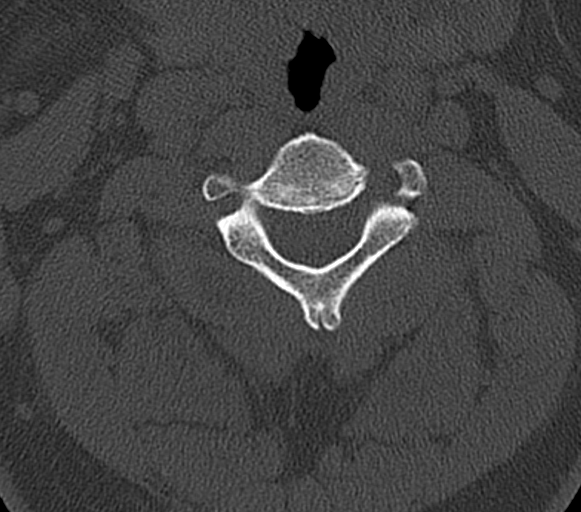
[im 91/109  soft-tissue]
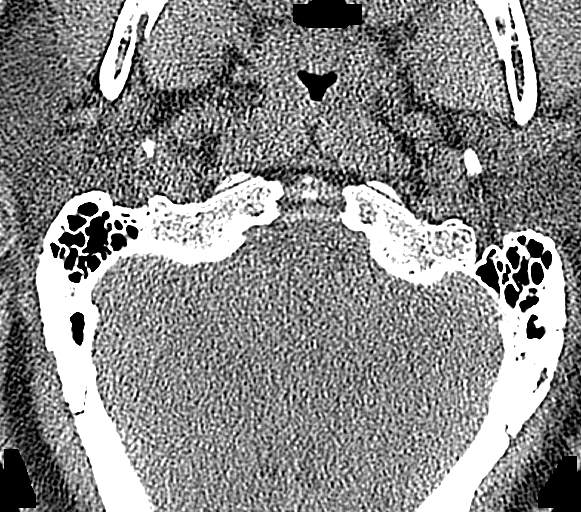
[im 91/109  bone]
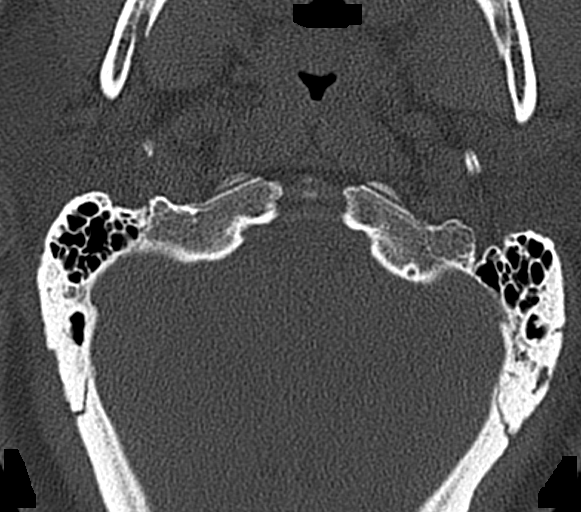

[13 of 33 positions shown; findings below may reference images not displayed]

FINDINGS: Suboptimal image quality likely due to large body habitus,
particularly affecting lower cervical spine.

Alignment: Straightening of cervical lordosis. Osteolytic changes
associated with the superior endplate of C6 and C7 vertebral bodies
are again seen.

Skull base and vertebrae: No acute fracture.

Soft tissues and spinal canal: No prevertebral fluid or swelling. No
visible canal hematoma.

Disc levels: Multilevel osteoarthritic changes worse at C4-C5, C5-C6
and C6-C7.

Upper chest: Negative.

Other: None.
IMPRESSION: No evidence of acute traumatic injury to the cervical spine.

Multilevel osteoarthritic changes.

Osteolytic changes of the superior endplate of C6 and C7 vertebral
bodies may represent large Schmorl's nodes or sequela of prior
discitis. The changes are grossly stable, accounting for suboptimal
image quality.

## 2017-07-27 ENCOUNTER — Ambulatory Visit: Payer: Medicare Other | Admitting: Internal Medicine

## 2017-07-27 ENCOUNTER — Other Ambulatory Visit: Payer: Medicare Other

## 2017-10-14 ENCOUNTER — Inpatient Hospital Stay: Payer: Medicaid Other | Admitting: Internal Medicine

## 2017-10-14 ENCOUNTER — Inpatient Hospital Stay: Payer: Medicaid Other

## 2017-10-29 ENCOUNTER — Ambulatory Visit: Payer: Self-pay | Admitting: Urology

## 2017-11-16 ENCOUNTER — Inpatient Hospital Stay: Payer: 59

## 2017-11-16 ENCOUNTER — Encounter: Payer: Self-pay | Admitting: Internal Medicine

## 2017-11-16 ENCOUNTER — Inpatient Hospital Stay (HOSPITAL_BASED_OUTPATIENT_CLINIC_OR_DEPARTMENT_OTHER): Payer: 59 | Admitting: Internal Medicine

## 2017-11-16 ENCOUNTER — Other Ambulatory Visit: Payer: Self-pay

## 2017-11-16 ENCOUNTER — Inpatient Hospital Stay: Payer: 59 | Attending: Internal Medicine

## 2017-11-16 VITALS — BP 135/75 | HR 93 | Temp 97.3°F | Resp 20 | Ht 71.0 in | Wt 300.0 lb

## 2017-11-16 DIAGNOSIS — I10 Essential (primary) hypertension: Secondary | ICD-10-CM | POA: Insufficient documentation

## 2017-11-16 DIAGNOSIS — D751 Secondary polycythemia: Secondary | ICD-10-CM

## 2017-11-16 DIAGNOSIS — F17210 Nicotine dependence, cigarettes, uncomplicated: Secondary | ICD-10-CM | POA: Insufficient documentation

## 2017-11-16 DIAGNOSIS — D45 Polycythemia vera: Secondary | ICD-10-CM

## 2017-11-16 DIAGNOSIS — E119 Type 2 diabetes mellitus without complications: Secondary | ICD-10-CM

## 2017-11-16 LAB — CBC WITH DIFFERENTIAL/PLATELET
Basophils Absolute: 0.1 10*3/uL (ref 0–0.1)
Basophils Relative: 1 %
Eosinophils Absolute: 0.1 10*3/uL (ref 0–0.7)
Eosinophils Relative: 1 %
HCT: 47.3 % (ref 40.0–52.0)
Hemoglobin: 16.7 g/dL (ref 13.0–18.0)
Lymphocytes Relative: 16 %
Lymphs Abs: 1.4 10*3/uL (ref 1.0–3.6)
MCH: 33.5 pg (ref 26.0–34.0)
MCHC: 35.2 g/dL (ref 32.0–36.0)
MCV: 95.1 fL (ref 80.0–100.0)
Monocytes Absolute: 0.6 10*3/uL (ref 0.2–1.0)
Monocytes Relative: 7 %
Neutro Abs: 6.3 10*3/uL (ref 1.4–6.5)
Neutrophils Relative %: 75 %
Platelets: 217 10*3/uL (ref 150–440)
RBC: 4.98 MIL/uL (ref 4.40–5.90)
RDW: 13.8 % (ref 11.5–14.5)
WBC: 8.4 10*3/uL (ref 3.8–10.6)

## 2017-11-16 NOTE — Assessment & Plan Note (Addendum)
#   Secondary erythrocytosis-recommend phlebotomy The hematocrit is greater than 50. Today hematocrit is 47.5.HOLD phlebotomy.  #smoking-counseled the patient to quit smoking.  States he is trying to quit smoking.  However is not interested in quitting altogether.   #Lung cancer screening:  discussed regarding lung cancer screening program.  Discussed the role of CT scans and annual basis.  Patient is interested.  Will refer.  # Patient follow-up with H&H in 6 months possible phlebotomy  # 25 minutes face-to-face with the patient discussing the above plan of care; more than 50% of time spent on counseling and coordination.   Cc; Hayley/Perkins.

## 2017-11-16 NOTE — Progress Notes (Signed)
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   Patient denies any headaches.  Denies any unusual nausea vomiting or abdominal pain. Patient denies any chest pain or shortness of breath or cough. Denies any unusual leg swelling. Denies any strokes.  Review of Systems  Constitutional: Negative for chills, diaphoresis, fever, malaise/fatigue and weight loss.  HENT: Negative for nosebleeds and sore throat.   Eyes: Negative for double vision.  Respiratory: Negative for cough, hemoptysis, sputum production, shortness of breath and wheezing.   Cardiovascular: Negative for chest pain, palpitations, orthopnea and leg swelling.  Gastrointestinal: Negative for abdominal pain, blood in stool, constipation, diarrhea, heartburn, melena, nausea and vomiting.  Genitourinary: Negative for dysuria, frequency and urgency.  Musculoskeletal: Negative for back pain and joint pain.  Skin: Negative.  Negative for itching and rash.  Neurological: Negative for dizziness, tingling, focal weakness, weakness and headaches.  Endo/Heme/Allergies: Does not bruise/bleed easily.  Psychiatric/Behavioral: Negative for depression. The patient is not nervous/anxious and does not have insomnia.      PAST MEDICAL HISTORY :  Past Medical History:  Diagnosis Date  . Arthritis   . Diabetes mellitus without complication ([place])   . DJD (degenerative joint disease)   . Hypertension   . 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 No pertinent surgical  history.  FAMILY HISTORY :  History reviewed. No pertinent family history.  SOCIAL HISTORY:  Disabled; smoker/ cutting down.  Social History   Tobacco Use  . Smoking status: Current Every Day Smoker    Packs/day: 0.25    Years: 40.00    Pack years: 10.00    Types: Cigarettes  . Smokeless tobacco: Never Used  Substance Use Topics  . Alcohol use: No    Alcohol/week: 0.0 standard drinks  . Drug use: No    ALLERGIES:  has No Known Allergies.  MEDICATIONS:  Current Outpatient Medications  Medication Sig Dispense Refill  . albuterol (PROAIR HFA) 108 (90 Base) MCG/ACT inhaler Inhale 1 puff into the lungs every 6 (six) hours as needed for wheezing or shortness of breath.     . ALPRAZolam (XANAX XR) 1 MG 24 hr tablet Take 1 mg by mouth daily.     [name] amLODipine (NORVASC) 10 MG tablet     . aspirin EC 81 MG tablet Take 325 mg by mouth.    . baclofen (LIORESAL) 10 MG tablet Take 10 mg by mouth 2 (two) times daily.     . cholecalciferol (VITAMIN D) 400 UNITS TABS tablet Take 10,000 Units by mouth daily.     [name] glipiZIDE (GLUCOTROL XL) 10 MG 24 hr tablet Take 10 mg by mouth daily with breakfast.     . glyBURIDE (DIABETA) 5 MG tablet Take 5 mg by mouth 2 (two) times daily with a meal.     . insulin glargine (LANTUS) 100 UNIT/ML injection Inject 80 Units into the skin daily.     . Insulin Pen Needle (B-D UF III MINI PEN NEEDLES) 31G X 5 MM MISC     . losartan (COZAAR) 25 MG tablet Take 25  mg by mouth daily.    . metFORMIN (GLUMETZA) 1000 MG (MOD) 24 hr tablet Take 1,000 mg by mouth 2 (two) times daily with a meal.     . Omega-3 Fatty Acids (FISH OIL) 1000 MG CAPS Take 1 capsule by mouth daily.     . Oxycodone HCl 20 MG TABS Take 20 mg by mouth 4 (four) times daily as needed (pain).     . sildenafil (REVATIO) 20 MG tablet Take 20 mg by mouth as needed (hypertension).     . simvastatin (ZOCOR) 40 MG tablet Take 1 tablet by mouth daily.    . tadalafil (CIALIS) 10 MG tablet Take 10 mg by mouth  daily as needed for erectile dysfunction. 4 tabs    . Testosterone (ANDROGEL PUMP) 20.25 MG/ACT (1.62%) GEL Apply 2 application topically daily.      No current facility-administered medications for this visit.     PHYSICAL EXAMINATION:  BP 135/75   Pulse 93   Temp (!) 97.3 F (36.3 C) (Tympanic)   Resp 20   Ht 5\' 11"  (1.803 m)   Wt 300 lb (136.1 kg)   BMI 41.84 kg/m   Filed Weights   11/16/17 1309  Weight: 300 lb (136.1 kg)    Physical Exam  Constitutional: He is oriented to person, place, and time and well-developed, well-nourished, and in no distress.  Obese.  He walks with a cane.  HENT:  Head: Normocephalic and atraumatic.  Mouth/Throat: Oropharynx is clear and moist. No oropharyngeal exudate.  Eyes: Pupils are equal, round, and reactive to light.  Neck: Normal range of motion. Neck supple.  Cardiovascular: Normal rate and regular rhythm.  Pulmonary/Chest: No respiratory distress. He has no wheezes.  Abdominal: Soft. Bowel sounds are normal. He exhibits no distension and no mass. There is no tenderness. There is no rebound and no guarding.  Musculoskeletal: Normal range of motion. He exhibits no edema or tenderness.  Neurological: He is alert and oriented to person, place, and time.  Skin: Skin is warm.  Psychiatric: Affect normal.     LABORATORY DATA:  I have reviewed the data as listed No results found for: NA, K, CL, CO2, GLUCOSE, BUN, CREATININE, CALCIUM, PROT, ALBUMIN, AST, ALT, ALKPHOS, BILITOT, GFRNONAA, GFRAA  No results found for: SPEP, UPEP  Lab Results  Component Value Date   WBC 8.4 11/16/2017   NEUTROABS 6.3 11/16/2017   HGB 16.7 11/16/2017   HCT 47.3 11/16/2017   MCV 95.1 11/16/2017   PLT 217 11/16/2017      Chemistry   No results found for: NA, K, CL, CO2, BUN, CREATININE, GLU No results found for: CALCIUM, ALKPHOS, AST, ALT, BILITOT     ASSESSMENT & PLAN:   Secondary erythrocytosis # Secondary erythrocytosis-recommend phlebotomy  The hematocrit is greater than 50. Today hematocrit is 47.5.HOLD phlebotomy.  #smoking-counseled the patient to quit smoking.  States he is trying to quit smoking.  However is not interested in quitting altogether.   #Lung cancer screening:  discussed regarding lung cancer screening program.  Discussed the role of CT scans and annual basis.  Patient is interested.  Will refer.  # Patient follow-up with H&H in 6 months possible phlebotomy  # 25 minutes face-to-face with the patient discussing the above plan of care; more than 50% of time spent on counseling and coordination.   Cc; Hayley/Perkins.        Cammie Sickle, MD 11/16/2017 10:55 PM

## 2017-11-18 ENCOUNTER — Ambulatory Visit (INDEPENDENT_AMBULATORY_CARE_PROVIDER_SITE_OTHER): Payer: 59 | Admitting: Urology

## 2017-11-18 ENCOUNTER — Telehealth: Payer: Self-pay | Admitting: *Deleted

## 2017-11-18 ENCOUNTER — Encounter: Payer: Self-pay | Admitting: Urology

## 2017-11-18 VITALS — BP 139/87 | HR 88 | Ht 71.0 in | Wt 299.1 lb

## 2017-11-18 DIAGNOSIS — E291 Testicular hypofunction: Secondary | ICD-10-CM | POA: Diagnosis not present

## 2017-11-18 DIAGNOSIS — N5201 Erectile dysfunction due to arterial insufficiency: Secondary | ICD-10-CM | POA: Diagnosis not present

## 2017-11-18 NOTE — Telephone Encounter (Signed)
Received referral for low dose lung cancer screening CT scan. Message left at phone number listed in EMR for patient to call me back to facilitate scheduling scan.  

## 2017-11-18 NOTE — Progress Notes (Signed)
11/18/2017 10:26 AM   [facility] September 23, 1955 [id number]  Referring provider: Donnie Coffin, MD [place], [place] [address]  Chief Complaint  Patient presents with  . Establish Care    HPI: 62 year old male presents to establish local urologic care.  He has been followed by Dr. Jacqlyn Larsen for hypogonadism and erectile dysfunction.  He is on AndroGel 1.62% 4 pumps daily and sildenafil for ED.  He last saw Dr. Jacqlyn Larsen in December 2018.  He denies bothersome lower urinary tract symptoms.  He denies flank, abdominal, pelvic or scrotal pain.  He denies gross hematuria.  He relates good energy level and libido.  He is followed in hematology for secondary erythrocytosis.  A hematocrit on 11/17/2015 was 47.3.   PMH: Past Medical History:  Diagnosis Date  . Arthritis   . Diabetes mellitus without complication ([place])   . DJD (degenerative joint disease)   . Hypertension   . 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  Home Medications:  Allergies as of 11/18/2017   No Known Allergies     Medication List        Accurate as of 11/18/17 10:26 AM. Always use your most recent med list.          ALPRAZolam 1 MG 24 hr tablet Commonly known as:  XANAX XR Take 1 mg by mouth daily.   amLODipine 10 MG tablet Commonly known as:  NORVASC   ANDROGEL PUMP 20.25 MG/ACT (1.62%) Gel Generic drug:  Testosterone Apply 2 application topically daily.   aspirin EC 81 MG tablet Take 325 mg by mouth.   B-D UF III MINI PEN NEEDLES 31G X 5 MM Misc Generic drug:  Insulin Pen Needle   baclofen 10 MG tablet Commonly known as:  LIORESAL Take 10 mg by mouth 2 (two) times daily.   cholecalciferol 400 units Tabs tablet Commonly known as:  VITAMIN D Take 10,000 Units by mouth daily.   Fish Oil 1000 MG Caps Take 1 capsule by mouth daily.   glipiZIDE 10 MG 24 hr tablet Commonly known as:  GLUCOTROL XL Take 10 mg by mouth daily with  breakfast.   glyBURIDE 5 MG tablet Commonly known as:  DIABETA Take 5 mg by mouth 2 (two) times daily with a meal.   insulin lispro 100 UNIT/ML KiwkPen Commonly known as:  HUMALOG   LANTUS 100 UNIT/ML injection Generic drug:  insulin glargine Inject 80 Units into the skin daily.   losartan 100 MG tablet Commonly known as:  COZAAR   metFORMIN 1000 MG (MOD) 24 hr tablet Commonly known as:  GLUMETZA Take 1,000 mg by mouth 2 (two) times daily with a meal.   ONE TOUCH ULTRA TEST test strip Generic drug:  glucose blood   Oxycodone HCl 20 MG Tabs Take 20 mg by mouth 4 (four) times daily as needed (pain).   PROAIR HFA 108 (90 Base) MCG/ACT inhaler Generic drug:  albuterol Inhale 1 puff into the lungs every 6 (six) hours as needed for wheezing or shortness of breath.   sildenafil 20 MG tablet Commonly known as:  REVATIO Take 20 mg by mouth as needed (hypertension).   simvastatin 40 MG tablet Commonly known as:  ZOCOR Take 1 tablet by mouth daily.   TRULICITY 8.67 YP/9.5KD Sopn Generic drug:  Dulaglutide       Allergies: No Known Allergies  Family History: No family history on file.  Social History:  reports that  he has been smoking cigarettes. He has a 10.00 pack-year smoking history. He has never used smokeless tobacco. He reports that he does not drink alcohol or use drugs.  ROS: UROLOGY Frequent Urination?: No Hard to postpone urination?: No Burning/pain with urination?: No Get up at night to urinate?: No Leakage of urine?: No Urine stream starts and stops?: No Trouble starting stream?: No Do you have to strain to urinate?: No Blood in urine?: No Urinary tract infection?: No Sexually transmitted disease?: No Injury to kidneys or bladder?: No Painful intercourse?: No Weak stream?: No Erection problems?: Yes Penile pain?: No  Gastrointestinal Nausea?: No Vomiting?: No Indigestion/heartburn?: Yes Diarrhea?: No Constipation?:  Yes  Constitutional Fever: No Night sweats?: No Weight loss?: No Fatigue?: No  Skin Skin rash/lesions?: No Itching?: No  Eyes Blurred vision?: No Double vision?: No  Ears/Nose/Throat Sore throat?: No Sinus problems?: No  Hematologic/Lymphatic Swollen glands?: No Easy bruising?: No  Cardiovascular Leg swelling?: No Chest pain?: No  Respiratory Cough?: No Shortness of breath?: No  Endocrine Excessive thirst?: No  Musculoskeletal Back pain?: Yes Joint pain?: Yes  Neurological Headaches?: No Dizziness?: No  Psychologic Depression?: No Anxiety?: Yes  Physical Exam: BP 139/87 (BP Location: Left Arm, Patient Position: Sitting, Cuff Size: Large)   Pulse 88   Ht 5\' 11"  (1.803 m)   Wt 299 lb 1.6 oz (135.7 kg)   BMI 41.72 kg/m   Constitutional:  Alert and oriented, No acute distress. HEENT: [place] AT, moist mucus membranes.  Trachea midline, no masses. Cardiovascular: No clubbing, cyanosis, or edema. Respiratory: Normal respiratory effort, no increased work of breathing. GI: Abdomen is soft, nontender, nondistended, no abdominal masses GU: No CVA tenderness.  Prostate 40 g, smooth without nodules Lymph: No cervical or inguinal lymphadenopathy. Skin: No rashes, bruises or suspicious lesions. Neurologic: Grossly intact, no focal deficits, moving all 4 extremities. Psychiatric: Normal mood and affect.   Assessment & Plan:   62 year old male with hypogonadism and erectile dysfunction.  Recent hematocrit was normal.  Testosterone level and PSA were drawn.  If stable he will follow-up in 6 months for a lab visit (testosterone/hematocrit) and office visit in 1 year.   Abbie Sons, [facility] [address] [place], [place] [address] [phone]

## 2017-11-19 ENCOUNTER — Telehealth: Payer: Self-pay | Admitting: Family Medicine

## 2017-11-19 LAB — TESTOSTERONE: Testosterone: 395 ng/dL (ref 264–916)

## 2017-11-19 LAB — PSA: Prostate Specific Ag, Serum: 0.5 ng/mL (ref 0.0–4.0)

## 2017-11-19 NOTE — Telephone Encounter (Signed)
-----   Message from Abbie Sons, MD sent at 11/19/2017  7:45 AM EDT ----- Testosterone level was normal at 395.  PSA normal at 0.5.

## 2017-11-19 NOTE — Telephone Encounter (Signed)
Patient notified and voiced understanding.

## 2017-11-25 ENCOUNTER — Telehealth: Payer: Self-pay | Admitting: *Deleted

## 2017-11-25 NOTE — Telephone Encounter (Signed)
Contacted patient regarding referral for lung screening scan. Patient reports only smoking .5ppd x 37 years. Denies ever smoking more than .5ppd. Reviewed that patient is not eligible for lung screening due to < 30 pack year smoking history.

## 2018-02-10 ENCOUNTER — Other Ambulatory Visit: Payer: Self-pay | Admitting: Urology

## 2018-02-10 DIAGNOSIS — D751 Secondary polycythemia: Secondary | ICD-10-CM

## 2018-02-10 DIAGNOSIS — E291 Testicular hypofunction: Secondary | ICD-10-CM

## 2018-02-10 NOTE — Telephone Encounter (Signed)
Pt lmom asking for a refill of his Androgel to be called in at [facility] and asks for confirmation of this. Please advise. Thanks.

## 2018-02-10 NOTE — Telephone Encounter (Signed)
It doesn't look like we have prescribed androgel in some time.  Please authorize refill.

## 2018-02-11 MED ORDER — TESTOSTERONE 20.25 MG/ACT (1.62%) TD GEL: 2.0000 "application " | Freq: Every day | TRANSDERMAL | 2 refills | Status: DC

## 2018-02-11 NOTE — Telephone Encounter (Signed)
rx sent- he needs a testosterone level and hct 03/2018

## 2018-02-12 NOTE — Telephone Encounter (Signed)
Called pt informed him of the need to come in for labs, pt gave verbal understanding. Scheduled pt for February 10th @9 :15am.

## 2018-02-15 ENCOUNTER — Telehealth: Payer: Self-pay

## 2018-02-15 NOTE — Telephone Encounter (Signed)
Incoming response from covermymeds stating that Andro gel has been denied for patient. Patient uses [facility].

## 2018-04-05 ENCOUNTER — Other Ambulatory Visit: Payer: 59

## 2018-04-05 DIAGNOSIS — E291 Testicular hypofunction: Secondary | ICD-10-CM

## 2018-04-06 ENCOUNTER — Encounter (INDEPENDENT_AMBULATORY_CARE_PROVIDER_SITE_OTHER): Payer: 59 | Admitting: Urology

## 2018-04-06 LAB — TESTOSTERONE: Testosterone: 574 ng/dL (ref 264–916)

## 2018-04-06 LAB — HEMATOCRIT: Hematocrit: 49.3 % (ref 37.5–51.0)

## 2018-04-08 ENCOUNTER — Telehealth: Payer: Self-pay

## 2018-04-08 NOTE — Telephone Encounter (Signed)
Patient notified

## 2018-04-08 NOTE — Telephone Encounter (Signed)
-----   Message from Abbie Sons, MD sent at 04/08/2018  2:31 PM EST ----- Testosterone level is good at 574.  Hematocrit was normal at 49.3

## 2018-04-09 NOTE — Progress Notes (Signed)
Patient checked in but then stated he could not wait and left.

## 2018-04-27 ENCOUNTER — Telehealth: Payer: Self-pay | Admitting: Urology

## 2018-04-27 DIAGNOSIS — D751 Secondary polycythemia: Secondary | ICD-10-CM

## 2018-04-27 NOTE — Telephone Encounter (Signed)
Pt uses [facility] and pt has been using 4 pumps instead of 2 and needs someone to call pharmacy to update directions on Testosterone Pump.  He would also like to try generic RX  For Cialis instead of Sildenafil.

## 2018-04-30 MED ORDER — TADALAFIL 20 MG PO TABS
20.0000 mg | ORAL_TABLET | Freq: Every day | ORAL | 0 refills | Status: DC | PRN
Start: 2018-04-30 — End: 2018-07-16

## 2018-04-30 MED ORDER — SILDENAFIL CITRATE 20 MG PO TABS: ORAL_TABLET | ORAL | 1 refills | Status: DC

## 2018-04-30 MED ORDER — TESTOSTERONE 20.25 MG/ACT (1.62%) TD GEL: 4.0000 "application " | Freq: Every day | TRANSDERMAL | 2 refills | Status: DC

## 2018-04-30 NOTE — Telephone Encounter (Signed)
Testosterone Rx was updated and generic tadalafil Rx sent

## 2018-05-17 ENCOUNTER — Ambulatory Visit: Payer: Medicaid Other | Admitting: Internal Medicine

## 2018-05-17 ENCOUNTER — Other Ambulatory Visit: Payer: Medicaid Other

## 2018-05-18 ENCOUNTER — Other Ambulatory Visit: Payer: Self-pay | Admitting: Family Medicine

## 2018-05-18 DIAGNOSIS — N400 Enlarged prostate without lower urinary tract symptoms: Secondary | ICD-10-CM

## 2018-05-19 ENCOUNTER — Other Ambulatory Visit: Payer: 59

## 2018-05-19 ENCOUNTER — Other Ambulatory Visit: Payer: Self-pay

## 2018-05-19 DIAGNOSIS — N400 Enlarged prostate without lower urinary tract symptoms: Secondary | ICD-10-CM

## 2018-05-20 ENCOUNTER — Telehealth: Payer: Self-pay | Admitting: Family Medicine

## 2018-05-20 LAB — PSA: Prostate Specific Ag, Serum: 0.6 ng/mL (ref 0.0–4.0)

## 2018-05-20 NOTE — Telephone Encounter (Signed)
Patient notified and voiced understanding.

## 2018-05-20 NOTE — Telephone Encounter (Signed)
-----   Message from Abbie Sons, MD sent at 05/20/2018  6:39 AM EDT ----- PSA stable at 0.6

## 2018-07-15 ENCOUNTER — Other Ambulatory Visit: Payer: Self-pay

## 2018-07-16 ENCOUNTER — Inpatient Hospital Stay: Payer: 59

## 2018-07-16 ENCOUNTER — Inpatient Hospital Stay (HOSPITAL_BASED_OUTPATIENT_CLINIC_OR_DEPARTMENT_OTHER): Payer: 59 | Admitting: Internal Medicine

## 2018-07-16 ENCOUNTER — Other Ambulatory Visit: Payer: Self-pay

## 2018-07-16 ENCOUNTER — Other Ambulatory Visit: Payer: Self-pay | Admitting: *Deleted

## 2018-07-16 ENCOUNTER — Inpatient Hospital Stay: Payer: 59 | Attending: Internal Medicine

## 2018-07-16 ENCOUNTER — Encounter: Payer: Self-pay | Admitting: Internal Medicine

## 2018-07-16 DIAGNOSIS — Z87891 Personal history of nicotine dependence: Secondary | ICD-10-CM

## 2018-07-16 DIAGNOSIS — D751 Secondary polycythemia: Secondary | ICD-10-CM | POA: Insufficient documentation

## 2018-07-16 LAB — HEMOGLOBIN: Hemoglobin: 17.1 g/dL — ABNORMAL HIGH (ref 13.0–17.0)

## 2018-07-16 LAB — HEMATOCRIT: HCT: 48.5 % (ref 39.0–52.0)

## 2018-07-16 NOTE — Progress Notes (Signed)
I connected with [facility] on 07/16/18 at 10:00 AM EDT by video enabled telemedicine visit and verified that I am speaking with the correct person using two identifiers.  I discussed the limitations, risks, security and privacy concerns of performing an evaluation and management service by telemedicine and the availability of in-person appointments. I also discussed with the patient that there may be a patient responsible charge related to this service. The patient expressed understanding and agreed to proceed.    Other persons participating in the visit and their role in the encounter: None Patient's location: Home Provider's location: Home   No history exists.     Chief Complaint: Erythrocytosis   History of present illness:Corey Foster 63 y.o.  male with history of erythrocytosis likely secondary is here for follow-up.  Patient denies any unusual headaches but denies any strokes.  Denies any weight loss or early satiety.  No nausea no vomiting.   Observation/objective: Labs pending today.  Assessment and plan: Secondary erythrocytosis # Secondary erythrocytosis-most recent hematocrit 3 months ago 49; patient asymptomatic.  Recommend phlebotomy only if hematocrit greater than 50.  Await labs from today.  #smoking-counseled to quit smoking 6-7 cig/day.   #Lung cancer screening: As per patient not a candidate for lung cancer screening program.   #Disposition:  # Labs/possible phlebotomy as planned today #Follow-up in 6 months- MD/CBC-BMP; possible phlebotomy- Dr.B  Cc; Hayley/Perkins.     Follow-up instructions:  I discussed the assessment and treatment plan with the patient.  The patient was provided an opportunity to ask questions and all were answered.  The patient agreed with the plan and demonstrated understanding of instructions.  The patient was advised to call back or seek an in person evaluation if the symptoms worsen or if the condition fails to improve as  anticipated.   Dr. Charlaine Dalton [facility] at [facility] 07/16/2018 9:25 AM

## 2018-07-16 NOTE — Progress Notes (Signed)
Per MD office note (07/16/2018) and order parameter, to proceed with phlebotomy if HCT is greater than 50. Today's HCT is 48.5, no phlebotomy needed based on MD orders. PT aware and denies any complaints. Pt stable at discharge.

## 2018-07-16 NOTE — Assessment & Plan Note (Addendum)
#   Secondary erythrocytosis-most recent hematocrit 3 months ago 49; patient asymptomatic.  Recommend phlebotomy only if hematocrit greater than 50.  Await labs from today.  #smoking-counseled to quit smoking 6-7 cig/day.   #Lung cancer screening: As per patient not a candidate for lung cancer screening program.   #Disposition:  # Labs/possible phlebotomy as planned today #Follow-up in 6 months- MD/CBC-BMP; possible phlebotomy- Dr.B  Cc; Hayley/Perkins.

## 2018-08-30 ENCOUNTER — Telehealth: Payer: Self-pay | Admitting: Urology

## 2018-08-30 DIAGNOSIS — D751 Secondary polycythemia: Secondary | ICD-10-CM

## 2018-08-30 MED ORDER — SILDENAFIL CITRATE 20 MG PO TABS: ORAL_TABLET | ORAL | 3 refills | Status: DC

## 2018-08-30 NOTE — Telephone Encounter (Signed)
Pt needs a refill for Sildenafil sent to [facility].  He is out of meds and would like a return phone call when this has been sent.

## 2018-08-30 NOTE — Telephone Encounter (Signed)
Script was sent, left vmail

## 2018-09-24 ENCOUNTER — Telehealth: Payer: Self-pay | Admitting: Urology

## 2018-09-24 DIAGNOSIS — D751 Secondary polycythemia: Secondary | ICD-10-CM

## 2018-09-24 MED ORDER — SILDENAFIL CITRATE 20 MG PO TABS: ORAL_TABLET | ORAL | 3 refills | Status: DC

## 2018-09-24 NOTE — Telephone Encounter (Signed)
Refill sent.

## 2018-09-24 NOTE — Telephone Encounter (Signed)
Pt. Request sildenafil refill be sent with 90 tablets instead of 30. Prescription is running out and not lasting for the entire month.

## 2018-10-08 ENCOUNTER — Other Ambulatory Visit: Payer: Self-pay | Admitting: Family Medicine

## 2018-10-08 DIAGNOSIS — D751 Secondary polycythemia: Secondary | ICD-10-CM

## 2018-10-11 ENCOUNTER — Other Ambulatory Visit: Payer: Self-pay

## 2018-10-11 DIAGNOSIS — D751 Secondary polycythemia: Secondary | ICD-10-CM

## 2018-10-11 MED ORDER — TESTOSTERONE 20.25 MG/ACT (1.62%) TD GEL: 4.0000 "application " | Freq: Every day | TRANSDERMAL | 1 refills | Status: DC

## 2018-11-19 ENCOUNTER — Ambulatory Visit: Payer: 59 | Admitting: Urology

## 2018-11-25 ENCOUNTER — Encounter: Payer: Self-pay | Admitting: Urology

## 2018-11-25 ENCOUNTER — Ambulatory Visit: Payer: 59 | Admitting: Urology

## 2018-12-15 ENCOUNTER — Ambulatory Visit (INDEPENDENT_AMBULATORY_CARE_PROVIDER_SITE_OTHER): Payer: 59 | Admitting: Urology

## 2018-12-15 ENCOUNTER — Encounter: Payer: Self-pay | Admitting: Urology

## 2018-12-15 ENCOUNTER — Other Ambulatory Visit: Payer: Self-pay

## 2018-12-15 VITALS — BP 126/81 | HR 93 | Ht 71.0 in | Wt 285.0 lb

## 2018-12-15 DIAGNOSIS — D751 Secondary polycythemia: Secondary | ICD-10-CM

## 2018-12-15 DIAGNOSIS — E291 Testicular hypofunction: Secondary | ICD-10-CM

## 2018-12-15 NOTE — Progress Notes (Signed)
12/15/2018 2:01 PM   Corey Foster 1955-12-12 [id number]  Referring provider: Donnie Coffin, MD [place],  [place] [address]  Chief Complaint  Patient presents with  . Hypogonadism    Urologic history: 1.  Hypogonadism -TRT AndroGel 1.62% 4 pumps daily  2.  Erectile dysfunction -Sildenafil   HPI: 63 y.o. male presents for annual follow-up.  He remains on AndroGel and states he is doing well.  He has good energy level and libido.  He has been followed by hematology for erythrocytosis however has not required phlebotomy in the last year.  He is scheduled for a follow-up hematocrit in January 2021. 21-month interim testosterone level looked good at 574. PSA was 0.6.  He denies bothersome lower urinary tract symptoms, breast tenderness/enlargement or lower extremity edema.  PMH: Past Medical History:  Diagnosis Date  . Arthritis   . Diabetes mellitus without complication ([place])   . DJD (degenerative joint disease)   . Hypertension   . 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 No pertinent surgical history.  Home Medications:  Allergies as of 12/15/2018   No Known Allergies     Medication List       Accurate as of December 15, 2018  2:01 PM. If you have any questions, ask your nurse or doctor.        ALPRAZolam 1 MG 24 hr tablet Commonly known as: XANAX XR Take 1 mg by mouth daily.   amLODipine 10 MG tablet Commonly known as: NORVASC   aspirin EC 325 MG tablet Take 325 mg by mouth.   B-D UF III MINI PEN NEEDLES 31G X 5 MM Misc Generic drug: Insulin Pen Needle   baclofen 10 MG tablet Commonly known as: LIORESAL Take 10 mg by mouth 2 (two) times daily.   cholecalciferol 10 MCG (400 UNIT) Tabs tablet Commonly known as: VITAMIN D3 Take 10,000 Units by mouth daily.   Fish Oil 1000 MG Caps Take 1 capsule by mouth daily.   insulin lispro 100 UNIT/ML KiwkPen Commonly known as: HUMALOG   Lantus 100  UNIT/ML injection Generic drug: insulin glargine Inject 80 Units into the skin daily. 80 units in the am and 120 units at bedtime   losartan 100 MG tablet Commonly known as: COZAAR Take 100 mg by mouth daily.   metFORMIN 1000 MG (MOD) 24 hr tablet Commonly known as: GLUMETZA Take 1,000 mg by mouth 2 (two) times daily with a meal.   ONE TOUCH ULTRA TEST test strip Generic drug: glucose blood   Oxycodone HCl 20 MG Tabs Take 20 mg by mouth 4 (four) times daily as needed (pain).   ProAir HFA 108 (90 Base) MCG/ACT inhaler Generic drug: albuterol Inhale 1 puff into the lungs every 6 (six) hours as needed for wheezing or shortness of breath.   sildenafil 20 MG tablet Commonly known as: REVATIO 2-5 tablets as needed for erectile dysfunction   simvastatin 40 MG tablet Commonly known as: ZOCOR Take 1 tablet by mouth daily.   Testosterone 20.25 MG/ACT (1.62%) Gel Commonly known as: AndroGel Pump Apply 4 application topically daily.   Trulicity [id number] Sopn Generic drug: Dulaglutide       Allergies: No Known Allergies  Family History: History reviewed. No pertinent family history.  Social History:  reports that he has been smoking cigarettes. He has a 10.00 pack-year smoking history. He has never used smokeless tobacco. He reports that he does not drink alcohol or use drugs.  ROS: UROLOGY Frequent Urination?: No Hard to postpone urination?: No Burning/pain with urination?: No Get up at night to urinate?: No Leakage of urine?: No Urine stream starts and stops?: No Trouble starting stream?: No Do you have to strain to urinate?: No Blood in urine?: No Urinary tract infection?: No Sexually transmitted disease?: No Injury to kidneys or bladder?: No Painful intercourse?: No Weak stream?: No Erection problems?: Yes Penile pain?: No  Gastrointestinal Nausea?: No Vomiting?: No Indigestion/heartburn?: No Diarrhea?: No Constipation?: No  Constitutional Fever:  No Night sweats?: No Weight loss?: No Fatigue?: No  Skin Skin rash/lesions?: No Itching?: No  Eyes Blurred vision?: Yes Double vision?: No  Ears/Nose/Throat Sore throat?: No Sinus problems?: No  Hematologic/Lymphatic Swollen glands?: No Easy bruising?: No  Cardiovascular Leg swelling?: No Chest pain?: No  Respiratory Cough?: No Shortness of breath?: No  Endocrine Excessive thirst?: No  Musculoskeletal Back pain?: Yes Joint pain?: Yes  Neurological Headaches?: No Dizziness?: No  Psychologic Depression?: No Anxiety?: No  Physical Exam: BP 126/81   Pulse 93   Ht 5\' 11"  (1.803 m)   Wt 285 lb (129.3 kg)   BMI 39.75 kg/m   Constitutional:  Alert and oriented, No acute distress. HEENT: [place] AT, moist mucus membranes.  Trachea midline, no masses. Cardiovascular: No clubbing, cyanosis, or edema. Respiratory: Normal respiratory effort, no increased work of breathing. GI: Abdomen is soft, nontender, nondistended, no abdominal masses GU: Prostate 40 g, smooth without nodules Lymph: No cervical or inguinal lymphadenopathy. Skin: No rashes, bruises or suspicious lesions. Neurologic: Grossly intact, no focal deficits, moving all 4 extremities. Psychiatric: Normal mood and affect.   Assessment & Plan:    - Hypogonadism Doing well on TRT.  AndroGel was refilled.   - Erectile dysfunction Stable on PDE 5 inhibitor therapy.   Abbie Sons, [facility] [address], [place], [place] [address] [phone]

## 2018-12-16 ENCOUNTER — Encounter: Payer: Self-pay | Admitting: Urology

## 2018-12-16 LAB — TESTOSTERONE: Testosterone: 538 ng/dL (ref 264–916)

## 2018-12-16 LAB — PSA: Prostate Specific Ag, Serum: 0.6 ng/mL (ref 0.0–4.0)

## 2018-12-16 MED ORDER — TESTOSTERONE 20.25 MG/ACT (1.62%) TD GEL: 4.0000 "application " | Freq: Every day | TRANSDERMAL | 1 refills | Status: DC

## 2018-12-20 ENCOUNTER — Telehealth: Payer: Self-pay

## 2018-12-20 NOTE — Telephone Encounter (Signed)
Patient notified

## 2018-12-20 NOTE — Telephone Encounter (Signed)
-----   Message from Abbie Sons, MD sent at 12/19/2018  5:42 PM EDT ----- Testosterone level looks good at 538.  PSA was 0.6.  Follow-up as scheduled

## 2019-01-14 ENCOUNTER — Inpatient Hospital Stay: Payer: 59 | Admitting: Internal Medicine

## 2019-01-14 ENCOUNTER — Inpatient Hospital Stay: Payer: 59 | Attending: Internal Medicine

## 2019-01-14 ENCOUNTER — Inpatient Hospital Stay: Payer: 59

## 2019-01-14 NOTE — Progress Notes (Deleted)
[facility] OFFICE PROGRESS NOTE  Patient Care Team: Corey Coffin, MD as PCP - General (Family Medicine)   SUMMARY OF HEMATOLOGIC/ONCOLOGIC HISTORY:  # SECONDARY ERYTHROCYTOSIS- JAK [id number]- Neg; Epo-3; [smoking/CPAP/Testosterone]; Phlebotomy q 72M [if HCT >50]  # Smoker- May 2018- recm LCS; OSA- on CPAP  INTERVAL HISTORY:  A pleasant 63 year old male patient with above history of secondary erythrocytosis and long-standing history of smoking is here for follow-up.   Patient denies any headaches.  Denies any unusual nausea vomiting or abdominal pain. Patient denies any chest pain or shortness of breath or cough. Denies any unusual leg swelling. Denies any strokes.  Review of Systems  Constitutional: Negative for chills, diaphoresis, fever, malaise/fatigue and weight loss.  HENT: Negative for nosebleeds and sore throat.   Eyes: Negative for double vision.  Respiratory: Negative for cough, hemoptysis, sputum production, shortness of breath and wheezing.   Cardiovascular: Negative for chest pain, palpitations, orthopnea and leg swelling.  Gastrointestinal: Negative for abdominal pain, blood in stool, constipation, diarrhea, heartburn, melena, nausea and vomiting.  Genitourinary: Negative for dysuria, frequency and urgency.  Musculoskeletal: Negative for back pain and joint pain.  Skin: Negative.  Negative for itching and rash.  Neurological: Negative for dizziness, tingling, focal weakness, weakness and headaches.  Endo/Heme/Allergies: Does not bruise/bleed easily.  Psychiatric/Behavioral: Negative for depression. The patient is not nervous/anxious and does not have insomnia.      PAST MEDICAL HISTORY :  Past Medical History:  Diagnosis Date  . Arthritis   . Diabetes mellitus without complication ([place])   . DJD (degenerative joint disease)   . Hypertension   . 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  FAMILY  HISTORY :  No family history on file.  SOCIAL HISTORY:  Disabled; smoker/ cutting down.  Social History   Tobacco Use  . Smoking status: Current Every Day Smoker    Packs/day: 0.25    Years: 40.00    Pack years: 10.00    Types: Cigarettes  . Smokeless tobacco: Never Used  Substance Use Topics  . Alcohol use: No    Alcohol/week: 0.0 standard drinks  . Drug use: No    ALLERGIES:  has No Known Allergies.  MEDICATIONS:  Current Outpatient Medications  Medication Sig Dispense Refill  . albuterol (PROAIR HFA) 108 (90 Base) MCG/ACT inhaler Inhale 1 puff into the lungs every 6 (six) hours as needed for wheezing or shortness of breath.     . ALPRAZolam (XANAX XR) 1 MG 24 hr tablet Take 1 mg by mouth daily.     [name] amLODipine (NORVASC) 10 MG tablet     . aspirin EC 325 MG tablet Take 325 mg by mouth.     . baclofen (LIORESAL) 10 MG tablet Take 10 mg by mouth 2 (two) times daily.     . cholecalciferol (VITAMIN D) 400 UNITS TABS tablet Take 10,000 Units by mouth daily.     . insulin glargine (LANTUS) 100 UNIT/ML injection Inject 80 Units into the skin daily. 80 units in the am and 120 units at bedtime    . insulin lispro (HUMALOG) 100 UNIT/ML KiwkPen     . Insulin Pen Needle (B-D UF III MINI PEN NEEDLES) 31G X 5 MM MISC     . losartan (COZAAR) 100 MG tablet Take 100 mg by mouth daily.     . metFORMIN (GLUMETZA) 1000 MG (MOD) 24 hr tablet Take 1,000 mg by mouth 2 (  two) times daily with a meal.     . Omega-3 Fatty Acids (FISH OIL) 1000 MG CAPS Take 1 capsule by mouth daily.     . ONE TOUCH ULTRA TEST test strip     . Oxycodone HCl 20 MG TABS Take 20 mg by mouth 4 (four) times daily as needed (pain).     . sildenafil (REVATIO) 20 MG tablet 2-5 tablets as needed for erectile dysfunction 90 tablet 3  . simvastatin (ZOCOR) 40 MG tablet Take 1 tablet by mouth daily.    . Testosterone (ANDROGEL PUMP) 20.25 MG/ACT (1.62%) GEL Apply 4 application topically daily. 150 g 1  . TRULICITY [id number]  SOPN      No current facility-administered medications for this visit.     PHYSICAL EXAMINATION:  There were no vitals taken for this visit.  There were no vitals filed for this visit.  Physical Exam  Constitutional: He is oriented to person, place, and time and well-developed, well-nourished, and in no distress.  Obese.  He walks with a cane.  HENT:  Head: Normocephalic and atraumatic.  Mouth/Throat: Oropharynx is clear and moist. No oropharyngeal exudate.  Eyes: Pupils are equal, round, and reactive to light.  Neck: Normal range of motion. Neck supple.  Cardiovascular: Normal rate and regular rhythm.  Pulmonary/Chest: No respiratory distress. He has no wheezes.  Abdominal: Soft. Bowel sounds are normal. He exhibits no distension and no mass. There is no abdominal tenderness. There is no rebound and no guarding.  Musculoskeletal: Normal range of motion.        General: No tenderness or edema.  Neurological: He is alert and oriented to person, place, and time.  Skin: Skin is warm.  Psychiatric: Affect normal.     LABORATORY DATA:  I have reviewed the data as listed No results found for: NA, K, CL, CO2, GLUCOSE, BUN, CREATININE, CALCIUM, PROT, ALBUMIN, AST, ALT, ALKPHOS, BILITOT, GFRNONAA, GFRAA  No results found for: SPEP, UPEP  Lab Results  Component Value Date   WBC 8.4 11/16/2017   NEUTROABS 6.3 11/16/2017   HGB 17.1 (H) 07/16/2018   HCT 48.5 07/16/2018   MCV 95.1 11/16/2017   PLT 217 11/16/2017      Chemistry   No results found for: NA, K, CL, CO2, BUN, CREATININE, GLU No results found for: CALCIUM, ALKPHOS, AST, ALT, BILITOT     ASSESSMENT & PLAN:   No problem-specific Assessment & Plan notes found for this encounter.       Cammie Sickle, MD 01/14/2019 6:36 AM

## 2019-01-14 NOTE — Assessment & Plan Note (Deleted)
#   Secondary erythrocytosis-most recent hematocrit 3 months ago 49; patient asymptomatic.  Recommend phlebotomy only if hematocrit greater than 50.  Await labs from today.  #smoking-counseled to quit smoking 6-7 cig/day.   #Lung cancer screening: As per patient not a candidate for lung cancer screening program.   #Disposition:  # Labs/possible phlebotomy as planned today #Follow-up in 6 months- MD/CBC-BMP; possible phlebotomy- Dr.B  Cc; [facility].  

## 2019-01-28 ENCOUNTER — Other Ambulatory Visit: Payer: Self-pay | Admitting: Urology

## 2019-01-28 DIAGNOSIS — D751 Secondary polycythemia: Secondary | ICD-10-CM

## 2019-04-15 ENCOUNTER — Emergency Department: Payer: 59

## 2019-04-15 ENCOUNTER — Encounter: Payer: Self-pay | Admitting: Intensive Care

## 2019-04-15 ENCOUNTER — Emergency Department
Admission: EM | Admit: 2019-04-15 | Discharge: 2019-04-15 | Disposition: A | Discharge status: Home / Self Care | Payer: 59 | Attending: Emergency Medicine | Admitting: Emergency Medicine

## 2019-04-15 ENCOUNTER — Other Ambulatory Visit: Payer: Self-pay

## 2019-04-15 DIAGNOSIS — I10 Essential (primary) hypertension: Secondary | ICD-10-CM | POA: Insufficient documentation

## 2019-04-15 DIAGNOSIS — Z79899 Other long term (current) drug therapy: Secondary | ICD-10-CM | POA: Diagnosis not present

## 2019-04-15 DIAGNOSIS — Z7982 Long term (current) use of aspirin: Secondary | ICD-10-CM | POA: Diagnosis not present

## 2019-04-15 DIAGNOSIS — R319 Hematuria, unspecified: Secondary | ICD-10-CM | POA: Diagnosis present

## 2019-04-15 DIAGNOSIS — N401 Enlarged prostate with lower urinary tract symptoms: Secondary | ICD-10-CM | POA: Insufficient documentation

## 2019-04-15 DIAGNOSIS — E119 Type 2 diabetes mellitus without complications: Secondary | ICD-10-CM | POA: Diagnosis not present

## 2019-04-15 DIAGNOSIS — F17210 Nicotine dependence, cigarettes, uncomplicated: Secondary | ICD-10-CM | POA: Diagnosis not present

## 2019-04-15 DIAGNOSIS — R310 Gross hematuria: Secondary | ICD-10-CM | POA: Insufficient documentation

## 2019-04-15 DIAGNOSIS — Z794 Long term (current) use of insulin: Secondary | ICD-10-CM | POA: Diagnosis not present

## 2019-04-15 HISTORY — DX: Sciatica, unspecified side: M54.30

## 2019-04-15 HISTORY — DX: Hyperlipidemia, unspecified: E78.5

## 2019-04-15 LAB — BASIC METABOLIC PANEL
Anion gap: 15 (ref 5–15)
BUN: 25 mg/dL — ABNORMAL HIGH (ref 8–23)
CO2: 24 mmol/L (ref 22–32)
Calcium: 9.3 mg/dL (ref 8.9–10.3)
Chloride: 97 mmol/L — ABNORMAL LOW (ref 98–111)
Creatinine, Ser: 1.84 mg/dL — ABNORMAL HIGH (ref 0.61–1.24)
GFR calc Af Amer: 44 mL/min — ABNORMAL LOW (ref >60)
GFR calc non Af Amer: 38 mL/min — ABNORMAL LOW (ref >60)
Glucose, Bld: 290 mg/dL — ABNORMAL HIGH (ref 70–99)
Potassium: 4.3 mmol/L (ref 3.5–5.1)
Sodium: 136 mmol/L (ref 135–145)

## 2019-04-15 LAB — URINALYSIS, COMPLETE (UACMP) WITH MICROSCOPIC
RBC / HPF: >50 RBC/hpf — ABNORMAL HIGH (ref 0–5)
Specific Gravity, Urine: 1.026 (ref 1.005–1.030)
Squamous Epithelial / HPF: NONE SEEN (ref 0–5)
WBC, UA: >50 WBC/hpf — ABNORMAL HIGH (ref 0–5)

## 2019-04-15 LAB — CBC
HCT: 50.1 % (ref 39.0–52.0)
Hemoglobin: 17.4 g/dL — ABNORMAL HIGH (ref 13.0–17.0)
MCH: 32.6 pg (ref 26.0–34.0)
MCHC: 34.7 g/dL (ref 30.0–36.0)
MCV: 93.8 fL (ref 80.0–100.0)
Platelets: 197 10*3/uL (ref 150–400)
RBC: 5.34 MIL/uL (ref 4.22–5.81)
RDW: 13.1 % (ref 11.5–15.5)
WBC: 10.2 10*3/uL (ref 4.0–10.5)
nRBC: 0 % (ref 0.0–0.2)

## 2019-04-15 MED ORDER — SULFAMETHOXAZOLE-TRIMETHOPRIM 800-160 MG PO TABS: 1.0000 | ORAL_TABLET | Freq: Two times a day (BID) | ORAL | 0 refills | Status: DC

## 2019-04-15 NOTE — ED Triage Notes (Signed)
Patient reports he started urinating blood around noon today. Denies pain

## 2019-04-15 NOTE — ED Provider Notes (Signed)
[facility] Emergency Department Provider Note  ____________________________________________  Time seen: Approximately 6:06 PM  I have reviewed the triage vital signs and the nursing notes.   HISTORY  Chief Complaint Hematuria    HPI Corey Foster is a 64 y.o. male who presents emergency department complaining of hematuria.  Patient states that roughly around noon today he started seeing frank blood in his urine.  Patient states that he has no history of hematuria.  He denies any flank pain, abdominal pain.  No dysuria.  No polyuria.  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  Patient denies any pain in testicles, penis, perineal region.  No fevers or chills.  Patient denies any other complaints other than hematuria.  No history of same, no history of kidney stones.  Patient denies any decreased or increased urination.         Past Medical History:  Diagnosis Date  . Arthritis   . Diabetes mellitus without complication ([place])   . DJD (degenerative joint disease)   . Hyperlipidemia   . Hypertension   . Sciatic nerve pain   . 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 BMI 40.0-44.9, adult ([place]) 03/02/2017  . Smoking 03/02/2017  . Left-sided low back pain with left-sided sciatica 11/08/2014  . Lumbar radiculopathy 11/08/2014  . DDD (degenerative disc disease), lumbosacral 11/08/2014  . Hip pain, chronic 11/08/2014  . Sacro-iliac pain 11/08/2014  . Essential hypertension 11/08/2014  . Diabetes mellitus due to underlying condition with diabetic neuropathy ([place]) 11/08/2014  . DDD (degenerative disc disease), cervical 11/08/2014  . 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 Incomplete bladder emptying 05/23/2013  . Elevated prostate specific antigen (PSA) 04/12/2012  . Benign prostatic hyperplasia with urinary obstruction 01/14/2012  . Decreased libido  01/14/2012  . Other long term (current) drug therapy 01/14/2012  . 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 Flu vaccine need 01/14/2012  . 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 Encounter for long-term (current) use of medications 01/14/2012    History reviewed. No pertinent surgical history.  Prior to Admission medications   Medication Sig Start Date End Date Taking? Authorizing Provider  albuterol (PROAIR HFA) 108 (90 Base) MCG/ACT inhaler Inhale 1 puff into the lungs every 6 (six) hours as needed for wheezing or shortness of breath.  02/15/13   [provider]  ALPRAZolam (XANAX XR) 1 MG 24 hr tablet Take 1 mg by mouth daily.  01/14/12   [provider]  amLODipine (NORVASC) 10 MG tablet  03/16/17   [provider]  aspirin EC 325 MG tablet Take 325 mg by mouth.     [provider]  baclofen (LIORESAL) 10 MG tablet Take 10 mg by mouth 2 (two) times daily.  10/07/13   [provider]  cholecalciferol (VITAMIN D) 400 UNITS TABS tablet Take 10,000 Units by mouth daily.     [provider]  insulin glargine (LANTUS) 100 UNIT/ML injection Inject 80 Units into the skin daily. 80 units in the am and 120 units at bedtime 01/14/12   [provider]  insulin lispro (HUMALOG) 100 UNIT/ML KiwkPen  10/15/17   [provider]  Insulin Pen Needle (B-D UF III MINI PEN NEEDLES) 31G X 5 MM MISC  04/28/12   [provider]  losartan (COZAAR) 100 MG tablet Take 100 mg by mouth daily.  09/25/17   [provider]  metFORMIN (GLUMETZA) 1000 MG (MOD) 24 hr tablet Take 1,000 mg by mouth 2 (two) times daily with a meal.     [provider]  Omega-3 Fatty Acids (FISH OIL) 1000 MG CAPS Take 1 capsule by mouth daily.  01/14/12   [provider]  ONE TOUCH ULTRA TEST test strip  10/12/17   [provider]  Oxycodone HCl 20 MG TABS Take 20 mg by mouth 4 (four) times daily as needed (pain).   06/20/15   [provider]  sildenafil (REVATIO) 20 MG tablet TAKE 2 TO 5 TABLETS BY MOUTH AS NEEDED FOR ERECTILE DYSFUNCTION 01/28/19   Stoioff, Ronda Fairly, MD  simvastatin (ZOCOR) 40 MG tablet Take 1 tablet by mouth daily. 07/07/16   [provider]  sulfamethoxazole-trimethoprim (BACTRIM DS) 800-160 MG tablet Take 1 tablet by mouth 2 (two) times daily. 04/15/19   Jakelyn Squyres, Charline Bills, PA-C  Testosterone (ANDROGEL PUMP) 20.25 MG/ACT (1.62%) GEL Apply 4 application topically daily. 12/16/18   Abbie Sons, MD  TRULICITY [id number] SOPN  11/05/17   [provider]    Allergies Patient has no known allergies.  History reviewed. No pertinent family history.  Social History Social History   Tobacco Use  . Smoking status: Current Every Day Smoker    Packs/day: 0.25    Years: 40.00    Pack years: 10.00    Types: Cigarettes  . Smokeless tobacco: Never Used  Substance Use Topics  . Alcohol use: Yes    Alcohol/week: 3.0 standard drinks    Types: 3 Cans of beer per week  . Drug use: Yes    Types: Cocaine     Review of Systems  Constitutional: No fever/chills Eyes: No visual changes. No discharge ENT: No upper respiratory complaints. Cardiovascular: no chest pain. Respiratory: no cough. No SOB. Gastrointestinal: No abdominal pain.  No nausea, no vomiting.  No diarrhea.  No constipation. Genitourinary: Negative for dysuria.  Positive for hematuria Musculoskeletal: Negative for musculoskeletal pain. Skin: Negative for rash, abrasions, lacerations, ecchymosis. Neurological: Negative for headaches, focal weakness or numbness. 10-point ROS otherwise negative.  ____________________________________________   PHYSICAL EXAM:  VITAL SIGNS: ED Triage Vitals  Enc Vitals Group     BP 04/15/19 1433 (!) 168/89     Pulse Rate 04/15/19 1433 63     Resp 04/15/19 1433 20     Temp 04/15/19 1433 98.6 F (37 C)     Temp Source 04/15/19 1433 Oral     SpO2 04/15/19  1433 96 %     Weight 04/15/19 1424 280 lb (127 kg)     Height 04/15/19 1424 5\' 11"  (1.803 m)     Head Circumference --      Peak Flow --      Pain Score 04/15/19 1424 0     Pain Loc --      Pain Edu? --      Excl. in [place]? --      Constitutional: Alert and oriented. Well appearing and in no acute distress. Eyes: Conjunctivae are normal. PERRL. EOMI. Head: Atraumatic. ENT:      Ears:       Nose: No congestion/rhinnorhea.      Mouth/Throat: Mucous membranes are moist.  Neck: No stridor.    Cardiovascular: Normal rate, regular rhythm. Normal S1 and S2.  Good peripheral circulation. Respiratory: Normal respiratory effort without tachypnea or retractions. Lungs CTAB. Good air entry to the bases with no decreased or absent breath sounds. Gastrointestinal: No external  abdominal wall findings.  Bowel sounds 4 quadrants. Soft and nontender to palpation all quadrants.. No guarding or rigidity. No palpable masses. No distention. No CVA tenderness. Musculoskeletal: Full range of motion to all extremities. No gross deformities appreciated. Neurologic:  Normal speech and language. No gross focal neurologic deficits are appreciated.  Skin:  Skin is warm, dry and intact. No rash noted. Psychiatric: Mood and affect are normal. Speech and behavior are normal. Patient exhibits appropriate insight and judgement.   ____________________________________________   LABS (all labs ordered are listed, but only abnormal results are displayed)  Labs Reviewed  URINALYSIS, COMPLETE (UACMP) WITH MICROSCOPIC - Abnormal; Notable for the following components:      Result Value   Color, Urine RED (*)    APPearance CLOUDY (*)    Glucose, UA   (*)    Value: TEST NOT REPORTED DUE TO COLOR INTERFERENCE OF URINE PIGMENT   Hgb urine dipstick   (*)    Value: TEST NOT REPORTED DUE TO COLOR INTERFERENCE OF URINE PIGMENT   Bilirubin Urine   (*)    Value: TEST NOT REPORTED DUE TO COLOR INTERFERENCE OF URINE PIGMENT    Ketones, ur   (*)    Value: TEST NOT REPORTED DUE TO COLOR INTERFERENCE OF URINE PIGMENT   Protein, ur   (*)    Value: TEST NOT REPORTED DUE TO COLOR INTERFERENCE OF URINE PIGMENT   Nitrite   (*)    Value: TEST NOT REPORTED DUE TO COLOR INTERFERENCE OF URINE PIGMENT   Leukocytes,Ua   (*)    Value: TEST NOT REPORTED DUE TO COLOR INTERFERENCE OF URINE PIGMENT   RBC / HPF >50 (*)    WBC, UA >50 (*)    Bacteria, UA MANY (*)    All other components within normal limits  BASIC METABOLIC PANEL - Abnormal; Notable for the following components:   Chloride 97 (*)    Glucose, Bld 290 (*)    BUN 25 (*)    Creatinine, Ser 1.84 (*)    GFR calc non Af Amer 38 (*)    GFR calc Af Amer 44 (*)    All other components within normal limits  CBC - Abnormal; Notable for the following components:   Hemoglobin 17.4 (*)    All other components within normal limits  URINE CULTURE   ____________________________________________  EKG   ____________________________________________  RADIOLOGY I personally viewed and evaluated these images as part of my medical decision making, as well as reviewing the written report by the radiologist.  CT Renal Stone Study  Result Date: 04/15/2019 CLINICAL DATA:  64 year old male with history of hematuria. EXAM: CT ABDOMEN AND PELVIS WITHOUT CONTRAST TECHNIQUE: Multidetector CT imaging of the abdomen and pelvis was performed following the standard protocol without IV contrast. COMPARISON:  No priors. FINDINGS: Lower chest: Aortic atherosclerosis. Hepatobiliary: Mild diffuse low attenuation throughout the hepatic parenchyma indicative of hepatic steatosis. No definite suspicious cystic or solid hepatic lesions are confidently identified on today's noncontrast CT examination. Unenhanced appearance of the gallbladder is normal. Pancreas: No definite pancreatic mass or peripancreatic fluid collections or inflammatory changes are noted on today's noncontrast CT examination. Spleen:  Unremarkable. Adrenals/Urinary Tract: No calcifications are identified within the collecting system of either kidney, along the course of either ureter, or within the lumen of the urinary bladder. 1.3 cm low-attenuation lesion in the interpolar region of the left kidney, incompletely characterized on today's non-contrast CT examination, but statistically likely a cyst. Right kidney and right  adrenal gland are normal in appearance. Mild adreniform thickening of the left adrenal gland, suggesting hyperplasia. No hydroureteronephrosis. Urine in the urinary bladder is intermediate attenuation, suggesting some blood products. Urinary bladder is wise otherwise normal in appearance. Stomach/Bowel: Unenhanced appearance of the stomach is normal. No pathologic dilatation of small bowel or colon. Normal appendix. Vascular/Lymphatic: Aortic atherosclerosis, without evidence of aneurysm or dissection in the abdominal or pelvic vasculature. No lymphadenopathy noted in the abdomen or pelvis. Reproductive: Prostate gland and seminal vesicles are unremarkable in appearance. Other: No significant volume of ascites.  No pneumoperitoneum. Musculoskeletal: There are no aggressive appearing lytic or blastic lesions noted in the visualized portions of the skeleton. IMPRESSION: 1. No definite source for hematuria identified on today's examination. Specifically, no urinary tract calculi. 2. There is intermediate attenuation urine within the lumen of the urinary bladder, suggesting the presence of blood products. In addition, there is a low-attenuation lesion in the interpolar region of the left kidney. This is incompletely characterized on today's noncontrast CT examination. Statistically, this is likely to represent a cyst. However, should the patient's hematuria persist or worsen, further characterization of this lesion with abdominal MRI with and without IV gadolinium would be recommended to exclude the possibility of a cystic renal  neoplasm. 3. Hepatic steatosis. 4. Aortic atherosclerosis. 5. Additional incidental findings, as above. Electronically Signed   By: Vinnie Langton M.D.   On: 04/15/2019 18:36    ____________________________________________    PROCEDURES  Procedure(s) performed:    Procedures    Medications - No data to display   ____________________________________________   INITIAL IMPRESSION / ASSESSMENT AND PLAN / ED COURSE  Pertinent labs & imaging results that were available during my care of the patient were reviewed by me and considered in my medical decision making (see chart for details).  Review of the [place] CSRS was performed in accordance of the [place] prior to dispensing any controlled drugs.           Patient's diagnosis is consistent with hematuria.  Patient presented to the emergency department with sudden onset of hematuria today.  Patient states that he has urinating frank blood.  No clots.  Patient denies any dysuria, polyuria.  Patient denies any decrease in urination.  He denies any decrease in the strength of his stream.  No flank pain.  No history of kidney stones.  Patient denies any fever chills, abdominal pain.  Overall exam is reassuring.  Patient did have gross hematuria, did have white blood cells and bacteria in his urine sample.  Remainder of urinalysis was unable to be determined given amount of blood.  CT scan was obtained to evaluate for hematuria.  No evidence of nephrolithiasis.  Incidental finding of cystic structure in the left kidney.  At this time, patient has had no perineal pain.  Does have a history of BPH.  As such I will cover the patient with antibiotics given amount of white blood cells, bacteria and history of BPH.  No evidence at this time for acute prostatitis.  Urine culture is sent.  Patient has a urologist, Dr. Bernardo Heater, and is referred back to Dr. Bernardo Heater for evaluation of gross hematuria.. Patient will be discharged home with prescriptions for  bactrim. Patient is to follow up with urology as needed or otherwise directed. Patient is given ED precautions to return to the ED for any worsening or new symptoms.     ____________________________________________  FINAL CLINICAL IMPRESSION(S) / ED DIAGNOSES  Final diagnoses:  Gross hematuria  NEW MEDICATIONS STARTED DURING THIS VISIT:  ED Discharge Orders         Ordered    sulfamethoxazole-trimethoprim (BACTRIM DS) 800-160 MG tablet  2 times daily     04/15/19 1933              This chart was dictated using voice recognition software/Dragon. Despite best efforts to proofread, errors can occur which can change the meaning. Any change was purely unintentional.    Darletta Moll, PA-C 04/15/19 1933    Earleen Newport, MD 04/15/19 2233

## 2019-04-17 LAB — URINE CULTURE: Culture: 50000 — AB

## 2019-04-22 ENCOUNTER — Encounter: Payer: Self-pay | Admitting: Urology

## 2019-04-22 ENCOUNTER — Other Ambulatory Visit: Payer: Self-pay

## 2019-04-22 ENCOUNTER — Ambulatory Visit (INDEPENDENT_AMBULATORY_CARE_PROVIDER_SITE_OTHER): Payer: 59 | Admitting: Urology

## 2019-04-22 VITALS — BP 120/77 | HR 90 | Ht 71.0 in | Wt 280.0 lb

## 2019-04-22 DIAGNOSIS — R310 Gross hematuria: Secondary | ICD-10-CM

## 2019-04-24 ENCOUNTER — Encounter: Payer: Self-pay | Admitting: Urology

## 2019-04-24 NOTE — Progress Notes (Signed)
04/22/2019 10:29 AM   Vickii Chafe 12-Nov-1955 [id number]  Referring provider: Donnie Coffin, MD [address] [place],  [place] [address]  Chief Complaint  Patient presents with  . Hematuria    HPI: 64 y.o. male followed for hypogonadism and erectile dysfunction.  He presented to the [facility] ED on 04/15/2019 with acute onset of total gross painless hematuria with clots.  He denied dysuria or flank, abdominal, pelvic pain.  Urinalysis was grossly bloody with >50 RBCs/WBC.  Urine culture grew 50,000 colonies of group B strep.  A stone protocol CT was performed which showed a 13 mm low-attenuation lesion in the left kidney which was incompletely characterized.  He was empirically treated with Septra DS.  His hematuria resolved after 24 hours and has not recurred.   PMH: Past Medical History:  Diagnosis Date  . Arthritis   . Diabetes mellitus without complication ([place])   . DJD (degenerative joint disease)   . Hyperlipidemia   . Hypertension   . Sciatic nerve pain   . 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 No pertinent surgical history.  Home Medications:  Allergies as of 04/22/2019   No Known Allergies     Medication List       Accurate as of April 22, 2019 11:59 PM. If you have any questions, ask your nurse or doctor.        ALPRAZolam 1 MG 24 hr tablet Commonly known as: XANAX XR Take 1 mg by mouth daily. What changed: Another medication with the same name was removed. Continue taking this medication, and follow the directions you see here. Changed by: Abbie Sons, MD   amLODipine 10 MG tablet Commonly known as: NORVASC   aspirin EC 325 MG tablet Take 325 mg by mouth.   B-D UF III MINI PEN NEEDLES 31G X 5 MM Misc Generic drug: Insulin Pen Needle   baclofen 10 MG tablet Commonly known as: LIORESAL Take 10 mg by mouth 2 (two) times daily.   cholecalciferol 10 MCG (400 UNIT) Tabs tablet Commonly known as:  VITAMIN D3 Take 10,000 Units by mouth daily.   Fish Oil 1000 MG Caps Take 1 capsule by mouth daily.   insulin lispro 100 UNIT/ML KiwkPen Commonly known as: HUMALOG   Jardiance 10 MG Tabs tablet Generic drug: empagliflozin Take 10 mg by mouth daily.   Lantus 100 UNIT/ML injection Generic drug: insulin glargine Inject 80 Units into the skin daily. 80 units in the am and 120 units at bedtime   losartan 100 MG tablet Commonly known as: COZAAR Take 100 mg by mouth daily.   metFORMIN 1000 MG (MOD) 24 hr tablet Commonly known as: GLUMETZA Take 1,000 mg by mouth 2 (two) times daily with a meal.   ONE TOUCH ULTRA TEST test strip Generic drug: glucose blood   Oxycodone HCl 20 MG Tabs Take 20 mg by mouth 4 (four) times daily as needed (pain).   ProAir HFA 108 (90 Base) MCG/ACT inhaler Generic drug: albuterol Inhale 1 puff into the lungs every 6 (six) hours as needed for wheezing or shortness of breath.   sildenafil 20 MG tablet Commonly known as: REVATIO TAKE 2 TO 5 TABLETS BY MOUTH AS NEEDED FOR ERECTILE DYSFUNCTION   simvastatin 40 MG tablet Commonly known as: ZOCOR Take 1 tablet by mouth daily.   sulfamethoxazole-trimethoprim 800-160 MG tablet Commonly known as: BACTRIM DS Take 1 tablet by mouth 2 (two) times daily.   Testosterone 20.25 MG/ACT (  1.62%) Gel Commonly known as: AndroGel Pump Apply 4 application topically daily.   Trulicity [id number] Sopn Generic drug: Dulaglutide   zolpidem 10 MG tablet Commonly known as: AMBIEN Take 10 mg by mouth at bedtime as needed.       Allergies: No Known Allergies  Family History: History reviewed. No pertinent family history.  Social History:  reports that he has been smoking cigarettes. He has a 10.00 pack-year smoking history. He has never used smokeless tobacco. He reports current alcohol use of about 3.0 standard drinks of alcohol per week. He reports current drug use. Drug: Cocaine.   Physical Exam: BP  120/77   Pulse 90   Ht 5\' 11"  (1.803 m)   Wt 280 lb (127 kg)   BMI 39.05 kg/m   Constitutional:  Alert and oriented, No acute distress. HEENT: [place] AT, moist mucus membranes.  Trachea midline, no masses. Cardiovascular: No clubbing, cyanosis, or edema. Respiratory: Normal respiratory effort, no increased work of breathing.    Pertinent Imaging: CT personally reviewed Results for orders placed during the hospital encounter of 04/15/19  CT Renal Stone Study   Narrative CLINICAL DATA:  64 year old male with history of hematuria.  EXAM: CT ABDOMEN AND PELVIS WITHOUT CONTRAST  TECHNIQUE: Multidetector CT imaging of the abdomen and pelvis was performed following the standard protocol without IV contrast.  COMPARISON:  No priors.  FINDINGS: Lower chest: Aortic atherosclerosis.  Hepatobiliary: Mild diffuse low attenuation throughout the hepatic parenchyma indicative of hepatic steatosis. No definite suspicious cystic or solid hepatic lesions are confidently identified on today's noncontrast CT examination. Unenhanced appearance of the gallbladder is normal.  Pancreas: No definite pancreatic mass or peripancreatic fluid collections or inflammatory changes are noted on today's noncontrast CT examination.  Spleen: Unremarkable.  Adrenals/Urinary Tract: No calcifications are identified within the collecting system of either kidney, along the course of either ureter, or within the lumen of the urinary bladder. 1.3 cm low-attenuation lesion in the interpolar region of the left kidney, incompletely characterized on today's non-contrast CT examination, but statistically likely a cyst. Right kidney and right adrenal gland are normal in appearance. Mild adreniform thickening of the left adrenal gland, suggesting hyperplasia. No hydroureteronephrosis. Urine in the urinary bladder is intermediate attenuation, suggesting some blood products. Urinary bladder is wise otherwise normal in  appearance.  Stomach/Bowel: Unenhanced appearance of the stomach is normal. No pathologic dilatation of small bowel or colon. Normal appendix.  Vascular/Lymphatic: Aortic atherosclerosis, without evidence of aneurysm or dissection in the abdominal or pelvic vasculature. No lymphadenopathy noted in the abdomen or pelvis.  Reproductive: Prostate gland and seminal vesicles are unremarkable in appearance.  Other: No significant volume of ascites.  No pneumoperitoneum.  Musculoskeletal: There are no aggressive appearing lytic or blastic lesions noted in the visualized portions of the skeleton.  IMPRESSION: 1. No definite source for hematuria identified on today's examination. Specifically, no urinary tract calculi. 2. There is intermediate attenuation urine within the lumen of the urinary bladder, suggesting the presence of blood products. In addition, there is a low-attenuation lesion in the interpolar region of the left kidney. This is incompletely characterized on today's noncontrast CT examination. Statistically, this is likely to represent a cyst. However, should the patient's hematuria persist or worsen, further characterization of this lesion with abdominal MRI with and without IV gadolinium would be recommended to exclude the possibility of a cystic renal neoplasm. 3. Hepatic steatosis. 4. Aortic atherosclerosis. 5. Additional incidental findings, as above.   Electronically Signed  By: Vinnie Langton M.D.   On: 04/15/2019 18:36     Assessment & Plan:    - Gross hematuria Recent episode of total gross painless hematuria which has resolved.  A CT urogram not renal stone CT is the recommended imaging study for asymptomatic gross hematuria.  He had a 13 mm indeterminate left renal lesion most likely a cyst.  His creatinine in the ED was 1.84 and I do not see a baseline creatinine.  Hematuria risk stratification is high and the recommended evaluation is a CT urogram and  cystoscopy.  Will schedule cystoscopy and obtain baseline creatinine values from his PCP.    Abbie Sons, [place] [address] [place], [place] [address] [phone]

## 2019-04-27 ENCOUNTER — Other Ambulatory Visit: Payer: Self-pay

## 2019-05-18 ENCOUNTER — Other Ambulatory Visit: Payer: Self-pay

## 2019-05-18 ENCOUNTER — Other Ambulatory Visit: Payer: Self-pay | Admitting: Radiology

## 2019-05-18 ENCOUNTER — Ambulatory Visit (INDEPENDENT_AMBULATORY_CARE_PROVIDER_SITE_OTHER): Payer: 59 | Admitting: Urology

## 2019-05-18 ENCOUNTER — Encounter: Payer: Self-pay | Admitting: Urology

## 2019-05-18 VITALS — BP 151/101 | HR 91 | Ht 71.0 in | Wt 280.0 lb

## 2019-05-18 DIAGNOSIS — D494 Neoplasm of unspecified behavior of bladder: Secondary | ICD-10-CM

## 2019-05-18 DIAGNOSIS — R310 Gross hematuria: Secondary | ICD-10-CM

## 2019-05-18 MED ORDER — GEMCITABINE CHEMO FOR BLADDER INSTILLATION 2000 MG: 2000.0000 mg | Freq: Once | INTRAVENOUS | Status: DC

## 2019-05-18 NOTE — Progress Notes (Signed)
05/18/2019 10:47 AM   Corey Foster Sep 19, 1955 [id number]  Referring provider: Donnie Coffin, MD [place],  [place] [address]  Chief Complaint  Patient presents with  . Cysto    HPI: 64 y.o. male who underwent office cystoscopy today for history gross hematuria and was found to have a bladder tumor.  He is scheduled for TURBT.   PMH: Past Medical History:  Diagnosis Date  . Arthritis   . Diabetes mellitus without complication ([place])   . DJD (degenerative joint disease)   . Hyperlipidemia   . Hypertension   . Sciatic nerve pain   . 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 No pertinent surgical history.  Home Medications:  Allergies as of 05/18/2019   No Known Allergies     Medication List       Accurate as of May 18, 2019 10:47 AM. If you have any questions, ask your nurse or doctor.        STOP taking these medications   sulfamethoxazole-trimethoprim 800-160 MG tablet Commonly known as: BACTRIM DS Stopped by: Abbie Sons, MD     TAKE these medications   ALPRAZolam 1 MG 24 hr tablet Commonly known as: XANAX XR Take 1 mg by mouth daily.   amLODipine 10 MG tablet Commonly known as: NORVASC   aspirin EC 325 MG tablet Take 325 mg by mouth.   B-D UF III MINI PEN NEEDLES 31G X 5 MM Misc Generic drug: Insulin Pen Needle   baclofen 10 MG tablet Commonly known as: LIORESAL Take 10 mg by mouth 2 (two) times daily.   cholecalciferol 10 MCG (400 UNIT) Tabs tablet Commonly known as: VITAMIN D3 Take 10,000 Units by mouth daily.   Fish Oil 1000 MG Caps Take 1 capsule by mouth daily.   insulin lispro 100 UNIT/ML KiwkPen Commonly known as: HUMALOG   Jardiance 10 MG Tabs tablet Generic drug: empagliflozin Take 10 mg by mouth daily.   Lantus 100 UNIT/ML injection Generic drug: insulin glargine Inject 80 Units into the skin daily. 80 units in the am and 120 units at bedtime   losartan 100 MG  tablet Commonly known as: COZAAR Take 100 mg by mouth daily.   metFORMIN 1000 MG tablet Commonly known as: GLUCOPHAGE Take 1,000 mg by mouth 2 (two) times daily. What changed: Another medication with the same name was removed. Continue taking this medication, and follow the directions you see here. Changed by: Abbie Sons, MD   ONE TOUCH ULTRA TEST test strip Generic drug: glucose blood   Oxycodone HCl 20 MG Tabs Take 20 mg by mouth 4 (four) times daily as needed (pain).   ProAir HFA 108 (90 Base) MCG/ACT inhaler Generic drug: albuterol Inhale 1 puff into the lungs every 6 (six) hours as needed for wheezing or shortness of breath.   sildenafil 20 MG tablet Commonly known as: REVATIO TAKE 2 TO 5 TABLETS BY MOUTH AS NEEDED FOR ERECTILE DYSFUNCTION   simvastatin 40 MG tablet Commonly known as: ZOCOR Take 1 tablet by mouth daily.   Testosterone 20.25 MG/ACT (1.62%) Gel Commonly known as: AndroGel Pump Apply 4 application topically daily.   Trulicity [id number] [id number] Sopn Generic drug: Dulaglutide   zolpidem 10 MG tablet Commonly known as: AMBIEN Take 10 mg by mouth at bedtime as needed.       Allergies: No Known Allergies  Family History: History reviewed. No pertinent family history.  Social History:  reports that he  has been smoking cigarettes. He has a 10.00 pack-year smoking history. He has never used smokeless tobacco. He reports current alcohol use of about 3.0 standard drinks of alcohol per week. He reports current drug use. Drug: Cocaine.   Physical Exam: BP (!) 151/101   Pulse 91   Ht 5\' 11"  (1.803 m)   Wt 280 lb (127 kg)   BMI 39.05 kg/m   Constitutional:  Alert and oriented, No acute distress. HEENT: [place] AT, moist mucus membranes.  Trachea midline, no masses. Cardiovascular: No clubbing, cyanosis, or edema.  RRR Respiratory: Normal respiratory effort, no increased work of breathing.  Clear GI: Abdomen is soft, nontender, nondistended, no abdominal  masses GU: No CVA tenderness Lymph: No cervical or inguinal lymphadenopathy. Skin: No rashes, bruises or suspicious lesions. Neurologic: Grossly intact, no focal deficits, moving all 4 extremities. Psychiatric: Normal mood and affect.  Laboratory Data:  Urinalysis Dipstick 3+ glucose Microscopy negative   Assessment & Plan:   64 yo male with a small papillary bladder tumor near the right bladder neck who presents for TURBT.  The procedure has been discussed in detail.  The indications and nature of the planned procedure were discussed as well as the potential benefits and expected outcome.  Alternatives have been discussed.  Potential complications were discussed including but not limited to bleeding, infection and bladder perforation. The postoperative care and followup was reviewed.  The patient was informed that he may need additional treatment along with periodic surveillance cystoscopy.  All of his questions were answered and he desires to proceed.  Discussed post resection intravesical gemcitabine which I recommended and he was in agreement.   Abbie Sons, [place] [address], [place], [place] [address] [phone]

## 2019-05-18 NOTE — Progress Notes (Signed)
   05/18/19  CC:  Chief Complaint  Patient presents with  . Cysto    HPI: 64 YO male recently seen in the ED with total gross painless hematuria.    Blood pressure (!) 151/101, pulse 91, height 5\' 11"  (1.803 m), weight 280 lb (127 kg). NED. A&Ox3.   No respiratory distress   Abd soft, NT, ND Normal phallus with bilateral descended testicles  Cystoscopy Procedure Note  Patient identification was confirmed, informed consent was obtained, and patient was prepped using Betadine solution.  Lidocaine jelly was administered per urethral meatus.     Pre-Procedure: - Inspection reveals a normal caliber urethral meatus.  Procedure: The flexible cystoscope was introduced without difficulty - No urethral strictures/lesions are present. 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  Refer to accompanying office note.   Abbie Sons, MD

## 2019-05-18 NOTE — H&P (View-Only) (Signed)
   05/18/19  CC:  Chief Complaint  Patient presents with  . Cysto    HPI: 64 YO male recently seen in the ED with total gross painless hematuria.    Blood pressure (!) 151/101, pulse 91, height 5\' 11"  (1.803 m), weight 280 lb (127 kg). NED. A&Ox3.   No respiratory distress   Abd soft, NT, ND Normal phallus with bilateral descended testicles  Cystoscopy Procedure Note  Patient identification was confirmed, informed consent was obtained, and patient was prepped using Betadine solution.  Lidocaine jelly was administered per urethral meatus.     Pre-Procedure: - Inspection reveals a normal caliber urethral meatus.  Procedure: The flexible cystoscope was introduced without difficulty - No urethral strictures/lesions are present. 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 Tignor and recommend TURBT.  Refer to accompanying office note.   Abbie Sons, MD

## 2019-05-19 LAB — URINALYSIS, COMPLETE
Bilirubin, UA: NEGATIVE
Ketones, UA: NEGATIVE
Leukocytes,UA: NEGATIVE
Nitrite, UA: NEGATIVE
Protein,UA: NEGATIVE
RBC, UA: NEGATIVE
Specific Gravity, UA: 1.02 (ref 1.005–1.030)
Urobilinogen, Ur: 0.2 mg/dL (ref 0.2–1.0)
pH, UA: 5 (ref 5.0–7.5)

## 2019-05-19 LAB — MICROSCOPIC EXAMINATION
Bacteria, UA: NONE SEEN
RBC, Urine: NONE SEEN /hpf (ref 0–2)

## 2019-05-20 ENCOUNTER — Other Ambulatory Visit: Payer: Self-pay | Admitting: Urology

## 2019-05-22 LAB — CULTURE, URINE COMPREHENSIVE

## 2019-05-23 ENCOUNTER — Encounter
Admission: RE | Admit: 2019-05-23 | Discharge: 2019-05-23 | Disposition: A | Discharge status: Home / Self Care | Payer: 59 | Source: Ambulatory Visit | Attending: Urology | Admitting: Urology

## 2019-05-23 ENCOUNTER — Other Ambulatory Visit: Payer: Self-pay

## 2019-05-23 DIAGNOSIS — I10 Essential (primary) hypertension: Secondary | ICD-10-CM | POA: Insufficient documentation

## 2019-05-23 DIAGNOSIS — Z01818 Encounter for other preprocedural examination: Secondary | ICD-10-CM | POA: Insufficient documentation

## 2019-05-23 DIAGNOSIS — E118 Type 2 diabetes mellitus with unspecified complications: Secondary | ICD-10-CM | POA: Diagnosis not present

## 2019-05-23 HISTORY — DX: Sleep apnea, unspecified: G47.30

## 2019-05-23 HISTORY — DX: Gastro-esophageal reflux disease without esophagitis: K21.9

## 2019-05-23 NOTE — Patient Instructions (Signed)
Your procedure is scheduled on: Tuesday 05/31/19  Report to [facility]. To find out your arrival time please call [phone] between 1PM - 3PM on Monday 05/30/19.   Remember: Instructions that are not followed completely may result in serious medical risk, up to and including death, or upon the discretion of your surgeon and anesthesiologist your surgery may need to be rescheduled.      _X__ 1. Do not eat food after midnight the night before your procedure.                 No gum chewing or hard candies. You may SUGAR FREE clear liquids such as water or Gatorade Zero up to 2 hours                 before you are scheduled to arrive for your surgery- DO NOT drink clear                 liquids within 2 hours of the start of your surgery.                    __X__2.  On the morning of surgery brush your teeth with toothpaste and water, you may rinse your mouth with mouthwash if you wish.  Do not swallow any toothpaste or mouthwash.       _X__ 3.  No Alcohol for 24 hours before or after surgery.     _X__ 4.  Do Not Smoke or use e-cigarettes For 24 Hours Prior to Your Surgery.                 Do not use any chewable tobacco products for at least 6 hours prior to                 Surgery.    __X__5.  Notify your doctor if there is any change in your medical condition      (cold, fever, infections).      Do not wear jewelry, make-up, hairpins, clips or nail polish. Do not wear lotions, powders, or perfumes.  Do not shave 48 hours prior to surgery. Men may shave face and neck. Do not bring valuables to the hospital.     [facility] is not responsible for any belongings or valuables.   Contacts, dentures/partials or body piercings may not be worn into surgery. Bring a case for your contacts, glasses or hearing aids, a denture cup will be supplied.    Patients discharged the day of surgery will not be allowed to drive  home.    Please read over the following fact sheets that you were given:   MRSA Information   __X__ Take these medicines the morning of surgery with A SIP OF WATER:     1. albuterol (PROAIR HFA)   2. ALPRAZolam (XANAX XR)  3. amLODipine (NORVASC)   4. baclofen (LIORESAL)  5. Oxycodone      __X__Shower before arrival.    _ X___ Use inhalers on the day of surgery. Also bring the inhaler with you to the hospital on the morning of surgery.    __X__ Stop Metformin 2 days prior to surgery: Your last dose will be Saturday evening 05/28/19. Resume after surgery.      __X__ Take 1/2 of usual insulin dose the night before surgery. No insulin the morning of surgery.     __X__ Stop Blood Thinners Aspirin: You reported stopping this yesterday.  Hold for 7 days prior to your surgery per Dr. Abbott Pao instructions.    __X__ Stop Anti-inflammatories 7 days before surgery such as Advil, Ibuprofen, Motrin, BC or Goodies Powder, Naprosyn, Naproxen, Aleve, Aspirin, Meloxicam. May take Tylenol Oxycodone and  if needed for pain or discomfort.     __X__ Stop the following herbal supplements until after your surgery:   Omega-3 Fatty Acids (FISH OIL)   vitamin C (ASCORBIC ACID)   Nutritional Supplements (KETO PO)

## 2019-05-24 ENCOUNTER — Encounter
Admission: RE | Admit: 2019-05-24 | Discharge: 2019-05-24 | Disposition: A | Discharge status: Home / Self Care | Payer: 59 | Source: Ambulatory Visit | Attending: Urology | Admitting: Urology

## 2019-05-24 DIAGNOSIS — Z01818 Encounter for other preprocedural examination: Secondary | ICD-10-CM | POA: Diagnosis not present

## 2019-05-27 ENCOUNTER — Other Ambulatory Visit: Payer: Self-pay

## 2019-05-27 ENCOUNTER — Other Ambulatory Visit
Admission: RE | Admit: 2019-05-27 | Discharge: 2019-05-27 | Disposition: A | Discharge status: Home / Self Care | Payer: 59 | Source: Ambulatory Visit | Attending: Urology | Admitting: Urology

## 2019-05-27 DIAGNOSIS — Z01812 Encounter for preprocedural laboratory examination: Secondary | ICD-10-CM | POA: Insufficient documentation

## 2019-05-27 DIAGNOSIS — Z20822 Contact with and (suspected) exposure to covid-19: Secondary | ICD-10-CM | POA: Diagnosis not present

## 2019-05-27 LAB — SARS CORONAVIRUS 2 (TAT 6-24 HRS): SARS Coronavirus 2: NEGATIVE

## 2019-05-31 ENCOUNTER — Ambulatory Visit
Admission: RE | Admit: 2019-05-31 | Discharge: 2019-05-31 | Disposition: A | Discharge status: Home / Self Care | Payer: 59 | Attending: Urology | Admitting: Urology

## 2019-05-31 ENCOUNTER — Ambulatory Visit: Payer: 59 | Admitting: Family

## 2019-05-31 ENCOUNTER — Ambulatory Visit: Payer: 59

## 2019-05-31 ENCOUNTER — Encounter: Payer: Self-pay | Admitting: Urology

## 2019-05-31 ENCOUNTER — Encounter
Admission: RE | Disposition: A | Discharge status: Home / Self Care | Payer: Self-pay | Source: Home / Self Care | Attending: Urology

## 2019-05-31 ENCOUNTER — Other Ambulatory Visit: Payer: Self-pay

## 2019-05-31 DIAGNOSIS — C676 Malignant neoplasm of ureteric orifice: Secondary | ICD-10-CM | POA: Diagnosis not present

## 2019-05-31 DIAGNOSIS — I10 Essential (primary) hypertension: Secondary | ICD-10-CM | POA: Diagnosis not present

## 2019-05-31 DIAGNOSIS — G4730 Sleep apnea, unspecified: Secondary | ICD-10-CM | POA: Insufficient documentation

## 2019-05-31 DIAGNOSIS — C672 Malignant neoplasm of lateral wall of bladder: Secondary | ICD-10-CM | POA: Insufficient documentation

## 2019-05-31 DIAGNOSIS — E119 Type 2 diabetes mellitus without complications: Secondary | ICD-10-CM | POA: Insufficient documentation

## 2019-05-31 DIAGNOSIS — Z7982 Long term (current) use of aspirin: Secondary | ICD-10-CM | POA: Insufficient documentation

## 2019-05-31 DIAGNOSIS — N529 Male erectile dysfunction, unspecified: Secondary | ICD-10-CM | POA: Insufficient documentation

## 2019-05-31 DIAGNOSIS — J45909 Unspecified asthma, uncomplicated: Secondary | ICD-10-CM | POA: Diagnosis not present

## 2019-05-31 DIAGNOSIS — D494 Neoplasm of unspecified behavior of bladder: Secondary | ICD-10-CM

## 2019-05-31 DIAGNOSIS — Z794 Long term (current) use of insulin: Secondary | ICD-10-CM | POA: Diagnosis not present

## 2019-05-31 DIAGNOSIS — M1990 Unspecified osteoarthritis, unspecified site: Secondary | ICD-10-CM | POA: Diagnosis not present

## 2019-05-31 DIAGNOSIS — E785 Hyperlipidemia, unspecified: Secondary | ICD-10-CM | POA: Diagnosis not present

## 2019-05-31 DIAGNOSIS — F17200 Nicotine dependence, unspecified, uncomplicated: Secondary | ICD-10-CM | POA: Diagnosis not present

## 2019-05-31 DIAGNOSIS — Z6839 Body mass index (BMI) 39.0-39.9, adult: Secondary | ICD-10-CM | POA: Insufficient documentation

## 2019-05-31 DIAGNOSIS — R310 Gross hematuria: Secondary | ICD-10-CM

## 2019-05-31 DIAGNOSIS — Z79899 Other long term (current) drug therapy: Secondary | ICD-10-CM | POA: Insufficient documentation

## 2019-05-31 DIAGNOSIS — Z79891 Long term (current) use of opiate analgesic: Secondary | ICD-10-CM | POA: Diagnosis not present

## 2019-05-31 HISTORY — PX: 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-C: SHX6459

## 2019-05-31 HISTORY — PX: CYSTOSCOPY W/ RETROGRADES: SHX1426

## 2019-05-31 LAB — URINE DRUG SCREEN, QUALITATIVE (ARMC ONLY)
Amphetamines, Ur Screen: NOT DETECTED
Barbiturates, Ur Screen: NOT DETECTED
Benzodiazepine, Ur Scrn: POSITIVE — AB
Cannabinoid 50 Ng, Ur ~~LOC~~: NOT DETECTED
Cocaine Metabolite,Ur ~~LOC~~: NOT DETECTED
MDMA (Ecstasy)Ur Screen: NOT DETECTED
Methadone Scn, Ur: NOT DETECTED
Opiate, Ur Screen: NOT DETECTED
Phencyclidine (PCP) Ur S: NOT DETECTED
Tricyclic, Ur Screen: NOT DETECTED

## 2019-05-31 LAB — GLUCOSE, CAPILLARY
Glucose-Capillary: 207 mg/dL — ABNORMAL HIGH (ref 70–99)
Glucose-Capillary: 210 mg/dL — ABNORMAL HIGH (ref 70–99)

## 2019-05-31 SURGERY — 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-C
Anesthesia: General | Site: Ureter

## 2019-05-31 MED ORDER — CEFAZOLIN SODIUM-DEXTROSE 2-4 GM/100ML-% IV SOLN
2.0000 g | INTRAVENOUS | Status: AC
  Administered 2019-05-31: 2 g via INTRAVENOUS

## 2019-05-31 MED ORDER — SODIUM CHLORIDE 0.9 % IV SOLN: INTRAVENOUS | Status: DC

## 2019-05-31 MED ORDER — ONDANSETRON HCL 4 MG/2ML IJ SOLN
INTRAMUSCULAR | Status: DC | PRN
  Administered 2019-05-31: 4 mg via INTRAVENOUS

## 2019-05-31 MED ORDER — IOHEXOL 180 MG/ML  SOLN
INTRAMUSCULAR | Status: DC | PRN
  Administered 2019-05-31: 20 mL

## 2019-05-31 MED ORDER — DEXAMETHASONE SODIUM PHOSPHATE 10 MG/ML IJ SOLN
INTRAMUSCULAR | Status: DC | PRN
  Administered 2019-05-31: 10 mg via INTRAVENOUS

## 2019-05-31 MED ORDER — FENTANYL CITRATE (PF) 100 MCG/2ML IJ SOLN: 25.0000 ug | INTRAMUSCULAR | Status: DC | PRN

## 2019-05-31 MED ORDER — LIDOCAINE HCL (CARDIAC) PF 100 MG/5ML IV SOSY
PREFILLED_SYRINGE | INTRAVENOUS | Status: DC | PRN
  Administered 2019-05-31: 100 mg via INTRAVENOUS

## 2019-05-31 MED ORDER — FAMOTIDINE 20 MG PO TABS: 20.0000 mg | ORAL_TABLET | Freq: Once | ORAL | Status: AC

## 2019-05-31 MED ORDER — PROPOFOL 10 MG/ML IV BOLUS
INTRAVENOUS | Status: DC | PRN
  Administered 2019-05-31: 40 mg via INTRAVENOUS
  Administered 2019-05-31: 160 mg via INTRAVENOUS

## 2019-05-31 MED ORDER — CEFAZOLIN SODIUM-DEXTROSE 2-4 GM/100ML-% IV SOLN
INTRAVENOUS | Status: AC
  Filled 2019-05-31: qty 100

## 2019-05-31 MED ORDER — FENTANYL CITRATE (PF) 100 MCG/2ML IJ SOLN
INTRAMUSCULAR | Status: DC | PRN
  Administered 2019-05-31: 50 ug via INTRAVENOUS
  Administered 2019-05-31 (×2): 25 ug via INTRAVENOUS

## 2019-05-31 MED ORDER — GEMCITABINE CHEMO FOR BLADDER INSTILLATION 2000 MG
INTRAVENOUS | Status: DC | PRN
  Administered 2019-05-31: 2000 mg via INTRAVESICAL

## 2019-05-31 MED ORDER — PROPOFOL 10 MG/ML IV BOLUS
INTRAVENOUS | Status: AC
  Filled 2019-05-31: qty 20

## 2019-05-31 MED ORDER — PHENYLEPHRINE HCL (PRESSORS) 10 MG/ML IV SOLN
INTRAVENOUS | Status: DC | PRN
  Administered 2019-05-31 (×2): 100 ug via INTRAVENOUS

## 2019-05-31 MED ORDER — DEXAMETHASONE SODIUM PHOSPHATE 10 MG/ML IJ SOLN
INTRAMUSCULAR | Status: AC
  Filled 2019-05-31: qty 1

## 2019-05-31 MED ORDER — SUCCINYLCHOLINE CHLORIDE 20 MG/ML IJ SOLN
INTRAMUSCULAR | Status: DC | PRN
  Administered 2019-05-31: 120 mg via INTRAVENOUS

## 2019-05-31 MED ORDER — LIDOCAINE HCL (PF) 2 % IJ SOLN
INTRAMUSCULAR | Status: AC
  Filled 2019-05-31: qty 5

## 2019-05-31 MED ORDER — FENTANYL CITRATE (PF) 100 MCG/2ML IJ SOLN
INTRAMUSCULAR | Status: AC
  Filled 2019-05-31: qty 2

## 2019-05-31 MED ORDER — ONDANSETRON HCL 4 MG/2ML IJ SOLN
INTRAMUSCULAR | Status: AC
  Filled 2019-05-31: qty 2

## 2019-05-31 MED ORDER — FAMOTIDINE 20 MG PO TABS
ORAL_TABLET | ORAL | Status: AC
  Administered 2019-05-31: 20 mg via ORAL
  Filled 2019-05-31: qty 1

## 2019-05-31 SURGICAL SUPPLY — 18 items
BAG DRAIN CYSTO-URO LG1000N (MISCELLANEOUS) ×4 IMPLANT
BRUSH SCRUB EZ  4% CHG (MISCELLANEOUS) ×4
BRUSH SCRUB EZ 4% CHG (MISCELLANEOUS) ×2 IMPLANT
CATH URETL 5X70 OPEN END (CATHETERS) ×4 IMPLANT
DRAPE UTILITY 15X26 TOWEL STRL (DRAPES) ×4 IMPLANT
GLOVE BIO SURGEON STRL SZ8 (GLOVE) ×4 IMPLANT
GOWN STRL REUS W/ TWL LRG LVL3 (GOWN DISPOSABLE) ×2 IMPLANT
GOWN STRL REUS W/TWL LRG LVL3 (GOWN DISPOSABLE) ×4
GOWN STRL REUS W/TWL XL LVL3 (GOWN DISPOSABLE) ×4 IMPLANT
GUIDEWIRE STR DUAL SENSOR (WIRE) ×4 IMPLANT
KIT TURNOVER CYSTO (KITS) ×4 IMPLANT
PACK CYSTO AR (MISCELLANEOUS) ×4 IMPLANT
SET CYSTO W/LG BORE CLAMP LF (SET/KITS/TRAYS/PACK) ×4 IMPLANT
SOL .9 NS 3000ML IRR  AL (IV SOLUTION) ×4
SOL .9 NS 3000ML IRR AL (IV SOLUTION) ×2
SOL .9 NS 3000ML IRR UROMATIC (IV SOLUTION) ×2 IMPLANT
SURGILUBE 2OZ TUBE FLIPTOP (MISCELLANEOUS) ×4 IMPLANT
WATER STERILE IRR 1000ML POUR (IV SOLUTION) ×4 IMPLANT

## 2019-05-31 NOTE — Transfer of Care (Signed)
Immediate Anesthesia Transfer of Care Note  Patient: FRASIER SPRINGBORN  Procedure(s) Performed: TRANSURETHRAL RESECTION OF BLADDER TUMOR WITH gemcitabine (N/A Bladder) CYSTOSCOPY WITH RETROGRADE PYELOGRAM (Bilateral Ureter)  Patient Location: PACU  Anesthesia Type:General  Level of Consciousness: awake, alert , oriented and patient cooperative  Airway & Oxygen Therapy: Patient Spontanous Breathing and Patient connected to face mask oxygen  Post-op Assessment: Report given to RN and Post -op Vital signs reviewed and stable  Post vital signs: Reviewed and stable  Last Vitals:  Vitals Value Taken Time  BP 155/84 05/31/19 1131  Temp    Pulse 88 05/31/19 1132  Resp 15 05/31/19 1131  SpO2 97 % 05/31/19 1132  Vitals shown include unvalidated device data.  Last Pain:  Vitals:   05/31/19 1130  TempSrc:   PainSc: (P) 0-No pain         Complications: No apparent anesthesia complications

## 2019-05-31 NOTE — Anesthesia Procedure Notes (Addendum)
Procedure Name: Intubation Date/Time: 05/31/2019 10:34 AM Performed by: Gentry Fitz, CRNA Pre-anesthesia Checklist: Patient identified, Emergency Drugs available, Suction available and Patient being monitored Patient Re-evaluated:Patient Re-evaluated prior to induction Oxygen Delivery Method: Circle system utilized Preoxygenation: Pre-oxygenation with 100% oxygen Induction Type: IV induction Ventilation: Mask ventilation without difficulty Laryngoscope Size: McGraph and 4 Grade View: Grade I Tube type: Oral Tube size: 7.5 mm Number of attempts: 1 Airway Equipment and Method: Stylet Placement Confirmation: ETT inserted through vocal cords under direct vision,  positive ETCO2 and breath sounds checked- equal and bilateral Secured at: 22 cm Tube secured with: Tape Dental Injury: Teeth and Oropharynx as per pre-operative assessment

## 2019-05-31 NOTE — Interval H&P Note (Signed)
History and Physical Interval Note: Lungs clear CV RRR Medications reviewed  05/31/2019 9:56 AM  Corey Foster  has presented today for surgery, with the diagnosis of bladder tumor.  The various methods of treatment have been discussed with the patient and family. After consideration of risks, benefits and other options for treatment, the patient has consented to  Procedure(s): TRANSURETHRAL RESECTION OF BLADDER TUMOR WITH gemcitabine (N/A) CYSTOSCOPY WITH RETROGRADE PYELOGRAM (Bilateral) as a surgical intervention.  The patient's history has been reviewed, patient examined, no change in status, stable for surgery.  I have reviewed the patient's chart and labs.  Questions were answered to the patient's satisfaction.     [place]

## 2019-05-31 NOTE — Discharge Instructions (Signed)
AMBULATORY SURGERY  DISCHARGE INSTRUCTIONS   1) The drugs that you were given will stay in your system until tomorrow so for the next 24 hours you should not:  A) Drive an automobile B) Make any legal decisions C) Drink any alcoholic beverage   2) You may resume regular meals tomorrow.  Today it is better to start with liquids and gradually work up to solid foods.  You may eat anything you prefer, but it is better to start with liquids, then soup and crackers, and gradually work up to solid foods.   3) Please notify your doctor immediately if you have any unusual bleeding, trouble breathing, redness and pain at the surgery site, drainage, fever, or pain not relieved by medication.    4) Additional Instructions:  Transurethral Resection of Bladder Tumor (TURBT) or Bladder Biopsy   Definition:  Transurethral Resection of the Bladder Tumor is a surgical procedure used to diagnose and remove tumors within the bladder. T  General instructions:     Your recent bladder surgery requires very little post hospital care but some definite precautions.  Despite the fact that no skin incisions were used, the area around the bladder incisions are raw and covered with scabs to promote healing and prevent bleeding. Certain precautions are needed to insure that the scabs are not disturbed over the next 2-4 weeks while the healing proceeds.  Because the raw surface inside your bladder and the irritating effects of urine you may expect frequency of urination and/or urgency (a stronger desire to urinate) and perhaps even getting up at night more often. This will usually resolve or improve slowly over the healing period. You may see some blood in your urine over the first 6 weeks. Do not be alarmed, even if the urine was clear for a while. Get off your feet and drink lots of fluids until clearing occurs. If you start to pass clots or don't improve call us.  Diet:  You may return to your normal diet  immediately. Because of the raw surface of your bladder, alcohol, spicy foods, foods high in acid and drinks with caffeine may cause irritation or frequency and should be used in moderation. To keep your urine flowing freely and avoid constipation, drink plenty of fluids during the day (8-10 glasses). Tip: Avoid cranberry juice because it is very acidic.  Activity:  Your physical activity doesn't need to be restricted. However, if you are very active, you may see some blood in the urine. We suggest that you reduce your activity under the circumstances until the bleeding has stopped.  Bowels:  It is important to keep your bowels regular during the postoperative period. Straining with bowel movements can cause bleeding. A bowel movement every other day is reasonable. Use a mild laxative if needed, such as milk of magnesia 2-3 tablespoons, or 2 Dulcolax tablets. Call if you continue to have problems. If you had been taking narcotics for pain, before, during or after your surgery, you may be constipated. Take a laxative if necessary.    Medication:  You should resume your pre-surgery medications unless told not to. In addition you may be given an antibiotic to prevent or treat infection. Antibiotics are not always necessary. All medication should be taken as prescribed until the bottles are finished unless you are having an unusual reaction to one of the drugs.   [facility] [address], [place], [place] [address] [phone]         Please  contact your physician with any problems or Same Day Surgery at [phone], Monday through Friday 6 am to 4 pm, or [place] at [facility] number at [phone].

## 2019-05-31 NOTE — Op Note (Signed)
Preoperative diagnosis: 1. Bladder tumor (3 cm)  Postoperative diagnosis:  1. Multifocal bladder tumors (largest, 3 cm)  Procedure:  1. Cystoscopy 2. Transurethral resection of bladder tumor (medium) 3. Bilateral retrograde pyelography with interpretation 4. Instillation intravesical gemcitabine  Surgeon: Nicki Reaper C. Dessie Delcarlo, M.D.  Anesthesia: General  Complications: None  Intraoperative findings:  1. ~1 cm papillary bladder tumor on small stalk adjacent to right ureteral orifice 2. ~3 cm papillary tumor left lateral wall near bladder neck at 3:00 edition 3. Mucosa with early papillary change medial and lateral to the left ureteral orifice 4. Left retrograde pyelogram-ureter normal in caliber without filling defect.  Bifid collecting system without dilation or filling defects 5. Right retrograde pyelogram-ureter normal in caliber without filling defects.  Collecting system without dilation or filling defect  EBL: Minimal  Specimens: 1. Papillary tumor near right ureteral orifice 2. Mucosa medial/lateral to left ureteral orifice 3. Papillary tumor left lateral wall      Indication: Corey Foster is a 64 y.o male with a history of total gross painless hematuria and clots 03/2019.  Only a noncontrast CT was performed in the ED which showed a 13 mm low attenuation lesion in the interpolar region of the left kidney which was incompletely characterized.  Creatinine was 1.84.  Cystoscopy remarkable for a papillary tumor near the left bladder neck.  After reviewing the management options for treatment, he elected to proceed with the above surgical procedure(s). We have discussed the potential benefits and risks of the procedure, side effects of the proposed treatment, the likelihood of the patient achieving the goals of the procedure, and any potential problems that might occur during the procedure or recuperation. Informed consent has been obtained.  Description of procedure:  The patient  was taken to the operating room and general anesthesia was induced.  The patient was placed in the dorsal lithotomy position, prepped and draped in the usual sterile fashion, and preoperative antibiotics were administered. A preoperative time-out was performed.   The urethra would not accept A 26 French continuous-flow resectoscope and was sequentially dilated from 22-30 [place] without difficulty.  The sheath with visual obturator was lubricated and then easily passed per urethra.  The urethra was normal in caliber without stricture.  The prostate demonstrated moderate lateral lobe enlargement with bladder neck elevation.  The bladder mucosa was inspected with findings as described above.  Attention then turned to the left ureteral orifice and a ureteral catheter was used to intubate the ureteral orifice.  Omnipaque contrast was injected through the ureteral catheter and a retrograde pyelogram was performed with findings as dictated above.  An identical procedure was performed on the contralateral side with findings as described above.  Neither orifice was involved with tumor.  Cold biopsies were taken of the mucosa with early papillary change around the left ureteral orifice and sent separately.  The biopsy sites were then cauterized with the loop electrode taking care to avoid the ureteral orifice.  Attention was directed to the right papillary tumor which was resected down to superficial muscle.  The resection site was cauterized taking care to avoid the ureteral orifice.  This tumor was sent separately  The left papillary tumor was then resected in a similar fashion.  This tumor was sent separately  Hemostasis was noted to be adequate and the resectoscope was removed.  A 16 French Foley catheter was placed with return tinged effluent upon irrigation.  After anesthetic reversal the patient was transported to the PACU in stable condition.  2000 mg of intravesical gemcitabine was instilled to the  bladder.  This was allowed to dwell in the PACU for 1 hour.  This was well-tolerated.  After 1 hour, the catheter was drained and removed.  Plan: -He will be notified with his pathology result -Schedule abdominal MRI for further evaluation of the indeterminate renal mass   Niccolo Burggraf C. Bernardo Heater,  MD

## 2019-05-31 NOTE — Anesthesia Preprocedure Evaluation (Addendum)
Anesthesia Evaluation  Patient identified by MRN, date of birth, ID band Patient awake    Reviewed: Allergy & Precautions, H&P , NPO status , Patient's Chart, lab work & pertinent test results  Airway Mallampati: III  TM Distance: >3 FB Neck ROM: full    Dental  (+) Partial Upper   Pulmonary asthma , sleep apnea and Continuous Positive Airway Pressure Ventilation , Current Smoker and Patient abstained from smoking.,    breath sounds clear to auscultation       Cardiovascular hypertension,  Rhythm:regular Rate:Normal     Neuro/Psych negative neurological ROS  negative psych ROS   GI/Hepatic Neg liver ROS, GERD  ,  Endo/Other  diabetes, Insulin DependentMorbid obesity  Renal/GU      Musculoskeletal   Abdominal   Peds  Hematology negative hematology ROS (+)   Anesthesia Other Findings Past Medical History: No date: Arthritis No date: Diabetes mellitus without complication ([facility]) No date: DJD (degenerative joint disease) No date: GERD (gastroesophageal reflux disease) No date: Hyperlipidemia No date: Hypertension No date: Sciatic nerve pain 06/30/2014: Secondary erythrocytosis No date: Sleep apnea  History reviewed. No pertinent surgical history.     Reproductive/Obstetrics negative OB ROS                            Anesthesia Physical Anesthesia Plan  ASA: III  Anesthesia Plan: General ETT   Post-op Pain Management:    Induction:   PONV Risk Score and Plan: Ondansetron, Dexamethasone and Treatment may vary due to age or medical condition  Airway Management Planned:   Additional Equipment:   Intra-op Plan:   Post-operative Plan:   Informed Consent: I have reviewed the patients History and Physical, chart, labs and discussed the procedure including the risks, benefits and alternatives for the proposed anesthesia with the patient or authorized representative who has indicated  his/her understanding and acceptance.     Dental Advisory Given  Plan Discussed with: Anesthesiologist  Anesthesia Plan Comments:        Anesthesia Quick Evaluation

## 2019-06-01 LAB — SURGICAL PATHOLOGY

## 2019-06-02 ENCOUNTER — Telehealth: Payer: Self-pay | Admitting: Urology

## 2019-06-02 DIAGNOSIS — N2889 Other specified disorders of kidney and ureter: Secondary | ICD-10-CM

## 2019-06-02 NOTE — Anesthesia Postprocedure Evaluation (Signed)
Anesthesia Post Note  Patient: Corey Foster  Procedure(s) Performed: TRANSURETHRAL RESECTION OF BLADDER TUMOR WITH gemcitabine (N/A Bladder) CYSTOSCOPY WITH RETROGRADE PYELOGRAM (Bilateral Ureter)  Patient location during evaluation: PACU Anesthesia Type: General Level of consciousness: awake and alert Pain management: pain level controlled Vital Signs Assessment: post-procedure vital signs reviewed and stable Respiratory status: spontaneous breathing, nonlabored ventilation and respiratory function stable Cardiovascular status: blood pressure returned to baseline and stable Postop Assessment: no apparent nausea or vomiting Anesthetic complications: no     Last Vitals:  Vitals:   05/31/19 1229 05/31/19 1257  BP: (!) 154/94 (!) 146/92  Pulse: 84 84  Resp: 18 18  Temp: 37 C (!) 36.3 C  SpO2: 99% 100%    Last Pain:  Vitals:   06/01/19 0817  TempSrc:   PainSc: [place]

## 2019-06-03 NOTE — Telephone Encounter (Signed)
I spoke with Corey Foster on 06/02/2019 regarding his pathology result.  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  The pathology report was discussed in detail.  No further treatment needed.  He had no post procedure problems.  Will schedule a cystoscopy in 3 months.  Abdominal MRI also ordered for the indeterminate lesion seen on CT.

## 2019-06-14 ENCOUNTER — Other Ambulatory Visit: Payer: Self-pay | Admitting: *Deleted

## 2019-06-14 DIAGNOSIS — N400 Enlarged prostate without lower urinary tract symptoms: Secondary | ICD-10-CM

## 2019-06-14 DIAGNOSIS — E291 Testicular hypofunction: Secondary | ICD-10-CM

## 2019-06-14 NOTE — Addendum Note (Signed)
Addended by: Verlene Mayer A on: 06/14/2019 01:10 PM   Modules accepted: Orders

## 2019-06-15 ENCOUNTER — Other Ambulatory Visit: Payer: Self-pay

## 2019-06-15 ENCOUNTER — Other Ambulatory Visit: Payer: 59

## 2019-06-15 DIAGNOSIS — E291 Testicular hypofunction: Secondary | ICD-10-CM

## 2019-06-16 LAB — TESTOSTERONE: Testosterone: 448 ng/dL (ref 264–916)

## 2019-06-16 LAB — HEMATOCRIT: Hematocrit: 50.4 % (ref 37.5–51.0)

## 2019-06-20 ENCOUNTER — Telehealth: Payer: Self-pay | Admitting: *Deleted

## 2019-06-20 NOTE — Telephone Encounter (Signed)
Notified patient as instructed, patient pleased. Discussed follow-up appointments, patient agrees  

## 2019-06-20 NOTE — Telephone Encounter (Signed)
-----   Message from Abbie Sons, MD sent at 06/19/2019 10:59 AM EDT ----- Testosterone level looks good at 448.  Hematocrit was normal.  Follow-up as scheduled

## 2019-06-23 ENCOUNTER — Other Ambulatory Visit: Payer: Self-pay | Admitting: Urology

## 2019-06-23 DIAGNOSIS — D751 Secondary polycythemia: Secondary | ICD-10-CM

## 2019-07-13 ENCOUNTER — Other Ambulatory Visit: Payer: Self-pay

## 2019-07-13 ENCOUNTER — Ambulatory Visit
Admission: RE | Admit: 2019-07-13 | Discharge: 2019-07-13 | Disposition: A | Discharge status: Home / Self Care | Payer: 59 | Source: Ambulatory Visit | Attending: Urology | Admitting: Urology

## 2019-07-13 DIAGNOSIS — N2889 Other specified disorders of kidney and ureter: Secondary | ICD-10-CM | POA: Diagnosis present

## 2019-07-13 MED ORDER — GADOBUTROL 1 MMOL/ML IV SOLN
10.0000 mL | Freq: Once | INTRAVENOUS | Status: AC | PRN
  Administered 2019-07-13: 10 mL via INTRAVENOUS

## 2019-07-18 ENCOUNTER — Telehealth: Payer: Self-pay | Admitting: *Deleted

## 2019-07-18 NOTE — Telephone Encounter (Signed)
Notified patient as instructed, patient pleased. Discussed follow-up appointments, patient agrees  

## 2019-07-18 NOTE — Telephone Encounter (Signed)
-----   Message from Abbie Sons, MD sent at 07/18/2019  8:02 AM EDT ----- The renal lesion seen on CT is a benign cyst on MRI.  No further follow-up imaging is needed.  Keep July 2021 appointment

## 2019-09-03 NOTE — Progress Notes (Signed)
   09/05/2019  CC:  Chief Complaint  Patient presents with  . Cysto    Urologic history: 1.  Urothelial carcinoma bladder -TURBT 05/31/2019; Ta low-grade -Post resection gemcitabine  2.  Indeterminate left renal mass on Noncon CT -Renal mass protocol MRI with 1.3 cm simple renal cyst   HPI:Corey Foster is a 64 y.o. male who presents today for a cystoscopy.   -Cystoscopy on 05/18/2019 showed a bladder tumor.  -Underwent TURBT on 05/31/2019.  -Pathology revealed papillary bladder tumor with a endoscopic appearance of a low-grade urothelial carcinoma. -Abdominal MRI on 07/13/2019 revealed a 1.3 cm left renal cyst. No acute abdominal process. -Denies hematuria.    Blood pressure (!) 176/98, pulse 99, height 5\' 11"  (1.803 m), weight 286 lb 9.6 oz (130 kg). NED. A&Ox3.   No respiratory distress   Abd soft, NT, ND Normal phallus with bilateral descended testicles  Cystoscopy Procedure Note  Patient identification was confirmed, informed consent was obtained, and patient was prepped using Betadine solution.  Lidocaine jelly was administered per urethral meatus.     Pre-Procedure: - Inspection reveals a normal caliber urethral meatus.  Procedure: The flexible cystoscope was introduced without difficulty - No urethral strictures/lesions are present. - Moderate lobe enlargement prostate - Mild bladder neck elevation - Bilateral ureteral orifices identified - Bladder mucosa  reveals no ulcers, tumors, or lesions - No bladder stones - No trabeculation  Retroflexion showed no tumors or any other abnormalities.    Post-Procedure: - Patient tolerated the procedure well  Urinalysis Negative   Assessment/ Plan: -No evidence recurrent tumor -Follow up in 9 months for cysto.  Fransico Him, am acting as a scribe for Dr. Nicki Reaper C. Mysti Haley,  I have reviewed the above documentation for accuracy and completeness, and I agree with the above.   Abbie Sons,  MD

## 2019-09-05 ENCOUNTER — Encounter: Payer: Self-pay | Admitting: Urology

## 2019-09-05 ENCOUNTER — Other Ambulatory Visit: Payer: Self-pay

## 2019-09-05 ENCOUNTER — Ambulatory Visit (INDEPENDENT_AMBULATORY_CARE_PROVIDER_SITE_OTHER): Payer: 59 | Admitting: Urology

## 2019-09-05 VITALS — BP 176/98 | HR 99 | Ht 71.0 in | Wt 286.6 lb

## 2019-09-05 DIAGNOSIS — N400 Enlarged prostate without lower urinary tract symptoms: Secondary | ICD-10-CM

## 2019-09-05 DIAGNOSIS — Z8551 Personal history of malignant neoplasm of bladder: Secondary | ICD-10-CM

## 2019-09-05 LAB — URINALYSIS, COMPLETE
Bilirubin, UA: NEGATIVE
Ketones, UA: NEGATIVE
Leukocytes,UA: NEGATIVE
Nitrite, UA: NEGATIVE
Specific Gravity, UA: 1.015 (ref 1.005–1.030)
Urobilinogen, Ur: 1 mg/dL (ref 0.2–1.0)
pH, UA: 7.5 (ref 5.0–7.5)

## 2019-09-05 LAB — MICROSCOPIC EXAMINATION: Bacteria, UA: NONE SEEN

## 2019-11-22 ENCOUNTER — Other Ambulatory Visit: Payer: Self-pay | Admitting: Urology

## 2019-11-22 DIAGNOSIS — D751 Secondary polycythemia: Secondary | ICD-10-CM

## 2019-11-30 ENCOUNTER — Telehealth: Payer: Self-pay | Admitting: Urology

## 2019-11-30 NOTE — Telephone Encounter (Signed)
Made appt

## 2019-11-30 NOTE — Telephone Encounter (Signed)
Patient called the office today requesting a prescription for trimix.  Please contact the patient to advise.

## 2019-12-14 ENCOUNTER — Ambulatory Visit (INDEPENDENT_AMBULATORY_CARE_PROVIDER_SITE_OTHER): Payer: 59 | Admitting: Urology

## 2019-12-14 ENCOUNTER — Other Ambulatory Visit: Payer: Self-pay

## 2019-12-14 VITALS — BP 112/85 | HR 111 | Ht 71.0 in | Wt 287.0 lb

## 2019-12-14 DIAGNOSIS — N5201 Erectile dysfunction due to arterial insufficiency: Secondary | ICD-10-CM | POA: Diagnosis not present

## 2019-12-15 ENCOUNTER — Encounter: Payer: Self-pay | Admitting: Urology

## 2019-12-15 ENCOUNTER — Other Ambulatory Visit: Payer: Self-pay | Admitting: Family Medicine

## 2019-12-15 MED ORDER — AMBULATORY NON FORMULARY MEDICATION: 2 refills | Status: DC

## 2019-12-15 NOTE — Progress Notes (Signed)
12/14/2019 9:55 AM   Corey Foster [birth year] [id number]  Referring provider: Donnie Coffin, MD [place],  [place] [address]  Chief Complaint  Patient presents with  . Other    Urologic history: 1.  Urothelial carcinoma bladder -TURBT 05/31/2019; Ta low-grade -Post resection gemcitabine  2.  Indeterminate left renal mass on Noncon CT -Renal mass protocol MRI with 1.3 cm simple renal cyst  HPI: 64 y.o. male presents for follow-up visit to restart Trimix intracavernosal injections.   Previously prescribed by Dr. Jacqlyn Larsen and states he was using 0.5 cc of standard Trimix  Had been on sildenafil but states this medication has decreased efficacy   PMH: Past Medical History:  Diagnosis Date  . Arthritis   . Diabetes mellitus without complication ([place])   . DJD (degenerative joint disease)   . GERD (gastroesophageal reflux disease)   . Hyperlipidemia   . Hypertension   . Sciatic nerve pain   . 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 Sleep apnea     Surgical History: Past Surgical History:  Procedure Laterality Date  . CYSTOSCOPY W/ RETROGRADES Bilateral 05/31/2019   Procedure: CYSTOSCOPY WITH RETROGRADE PYELOGRAM;  Surgeon: Abbie Sons, MD;  Location: [facility] ORS;  Service: Urology;  Laterality: Bilateral;  . 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-C N/A 05/31/2019   Procedure: TRANSURETHRAL RESECTION OF BLADDER TUMOR WITH gemcitabine;  Surgeon: Abbie Sons, MD;  Location: [facility] ORS;  Service: Urology;  Laterality: N/A;    Home Medications:  Allergies as of 12/14/2019   No Known Allergies     Medication List       Accurate as of December 14, 2019 11:59 PM. If you have any questions, ask your nurse or doctor.        ALPRAZolam 1 MG 24 hr tablet Commonly known as: XANAX XR Take 1 mg by mouth daily.   ALPRAZolam 1 MG tablet Commonly known as: XANAX Take 1 mg by mouth 4 (four) times daily as needed.   amLODipine 10 MG  tablet Commonly known as: NORVASC   aspirin EC 325 MG tablet Take 325 mg by mouth.   B-D UF III MINI PEN NEEDLES 31G X 5 MM Misc Generic drug: Insulin Pen Needle   baclofen 10 MG tablet Commonly known as: LIORESAL Take 10 mg by mouth 2 (two) times daily.   cholecalciferol 10 MCG (400 UNIT) Tabs tablet Commonly known as: VITAMIN D3 Take 10,000 Units by mouth daily.   Fish Oil 1000 MG Caps Take 1 capsule by mouth daily.   insulin lispro 100 UNIT/ML KiwkPen Commonly known as: HUMALOG 40 Units.   Jardiance 10 MG Tabs tablet Generic drug: empagliflozin Take 10 mg by mouth daily.   KETO PO Take 1 capsule by mouth.   Lantus 100 UNIT/ML injection Generic drug: insulin glargine Inject 80 Units into the skin daily. 80 units in the am and 120 units at bedtime   losartan 100 MG tablet Commonly known as: COZAAR Take 100 mg by mouth daily.   metFORMIN 1000 MG tablet Commonly known as: GLUCOPHAGE Take 1,000 mg by mouth 2 (two) times daily.   ONE TOUCH ULTRA TEST test strip Generic drug: glucose blood   Oxycodone HCl 20 MG Tabs Take 20 mg by mouth 4 (four) times daily as needed (pain).   ProAir HFA 108 (90 Base) MCG/ACT inhaler Generic drug: albuterol Inhale 1 puff into the lungs every 6 (six) hours as needed for wheezing or shortness of breath.  sildenafil 20 MG tablet Commonly known as: REVATIO TAKE 2 TO 5 TABLETS BY MOUTH AS NEEDED FOR ERECTILE DYSFUNCTION   simvastatin 40 MG tablet Commonly known as: ZOCOR Take 1 tablet by mouth daily.   Testosterone 20.25 MG/ACT (1.62%) Gel APPLY 4 APPLICATIONS TOPICALLY DAILY.   Trulicity 5.18 ZF/5.8IP Sopn Generic drug: Dulaglutide   vitamin C 250 MG tablet Commonly known as: ASCORBIC ACID Take 250 mg by mouth daily.   zolpidem 10 MG tablet Commonly known as: AMBIEN Take 10 mg by mouth at bedtime as needed.       Allergies: No Known Allergies  Family History: No family history on file.  Social History:   reports that he has been smoking cigarettes. He has a 10.00 pack-year smoking history. He has never used smokeless tobacco. He reports current alcohol use of about 3.0 standard drinks of alcohol per week. He reports current drug use. Drugs: Cocaine and Marijuana.   Physical Exam: BP 112/85   Pulse (!) 111   Ht 5\' 11"  (1.803 m)   Wt 287 lb (130.2 kg)   BMI 40.03 kg/m   Constitutional:  Alert and oriented, No acute distress.    Assessment & Plan:    1.  Erectile dysfunction  Desires to resume Trimix  Injection technique reviewed and he indicated he did not need refresher training for injection  Well send Rx to [facility]  Would start at 0.3 cc and titrate by 0.1 cc to an erection lasting no longer than 1 hour   Abbie Sons, MD  [facility] [address] [place],  [id number] [phone]

## 2019-12-16 ENCOUNTER — Ambulatory Visit: Payer: 59 | Admitting: Urology

## 2019-12-21 ENCOUNTER — Ambulatory Visit: Payer: 59 | Admitting: Urology

## 2020-02-21 ENCOUNTER — Other Ambulatory Visit: Payer: Self-pay | Admitting: Urology

## 2020-02-21 DIAGNOSIS — D751 Secondary polycythemia: Secondary | ICD-10-CM

## 2020-02-28 ENCOUNTER — Other Ambulatory Visit: Payer: Self-pay | Admitting: Urology

## 2020-02-28 DIAGNOSIS — D751 Secondary polycythemia: Secondary | ICD-10-CM

## 2020-03-20 ENCOUNTER — Other Ambulatory Visit: Payer: Self-pay | Admitting: Urology

## 2020-03-20 ENCOUNTER — Other Ambulatory Visit: Payer: Self-pay | Admitting: *Deleted

## 2020-03-20 DIAGNOSIS — D751 Secondary polycythemia: Secondary | ICD-10-CM

## 2020-03-22 ENCOUNTER — Other Ambulatory Visit: Payer: Self-pay

## 2020-03-22 DIAGNOSIS — E291 Testicular hypofunction: Secondary | ICD-10-CM

## 2020-03-23 ENCOUNTER — Other Ambulatory Visit: Payer: Self-pay

## 2020-03-26 ENCOUNTER — Other Ambulatory Visit: Payer: Self-pay

## 2020-03-27 ENCOUNTER — Other Ambulatory Visit: Payer: Self-pay

## 2020-03-27 ENCOUNTER — Other Ambulatory Visit: Payer: Medicare Other

## 2020-03-27 DIAGNOSIS — E291 Testicular hypofunction: Secondary | ICD-10-CM

## 2020-03-28 LAB — HEMATOCRIT: Hematocrit: 47.1 % (ref 37.5–51.0)

## 2020-03-28 LAB — TESTOSTERONE: Testosterone: >1500 ng/dL — ABNORMAL HIGH (ref 264–916)

## 2020-03-30 ENCOUNTER — Telehealth: Payer: Self-pay | Admitting: Urology

## 2020-03-30 NOTE — Telephone Encounter (Signed)
Have him decrease to 2 pumps total per day.  Repeat testosterone level 6 weeks

## 2020-03-30 NOTE — Telephone Encounter (Signed)
Testosterone level was elevated at >1500.  Please verify how many pumps of testosterone gel he is applying daily and where he is applying.

## 2020-03-30 NOTE — Telephone Encounter (Signed)
Notified patient as instructed, . He will return in 6 weeks for lab,

## 2020-03-30 NOTE — Telephone Encounter (Signed)
Patient states he is doing two pumps two times daily.Morning and afternoon.

## 2020-03-30 NOTE — Telephone Encounter (Signed)
Patient is applying the gel to his arm, 2 times daily. He states he applied the gel before his labs

## 2020-05-14 ENCOUNTER — Encounter: Payer: Self-pay | Admitting: *Deleted

## 2020-05-16 ENCOUNTER — Other Ambulatory Visit: Payer: Self-pay

## 2020-05-16 ENCOUNTER — Other Ambulatory Visit: Payer: Self-pay | Admitting: Family Medicine

## 2020-05-16 ENCOUNTER — Encounter: Payer: Self-pay | Admitting: Urology

## 2020-05-16 DIAGNOSIS — E291 Testicular hypofunction: Secondary | ICD-10-CM

## 2020-06-06 ENCOUNTER — Other Ambulatory Visit: Payer: Self-pay | Admitting: Urology

## 2020-06-06 ENCOUNTER — Encounter: Payer: Self-pay | Admitting: Urology

## 2020-06-14 ENCOUNTER — Other Ambulatory Visit: Payer: Self-pay | Admitting: Urology

## 2020-06-15 ENCOUNTER — Encounter: Payer: Self-pay | Admitting: Urology

## 2020-06-21 ENCOUNTER — Ambulatory Visit: Payer: 59 | Admitting: Urology

## 2020-07-12 ENCOUNTER — Ambulatory Visit (INDEPENDENT_AMBULATORY_CARE_PROVIDER_SITE_OTHER): Payer: 59 | Admitting: Urology

## 2020-07-12 ENCOUNTER — Other Ambulatory Visit: Payer: Self-pay

## 2020-07-12 ENCOUNTER — Encounter: Payer: Self-pay | Admitting: Urology

## 2020-07-12 VITALS — BP 152/84 | HR 72 | Ht 71.0 in | Wt 291.0 lb

## 2020-07-12 DIAGNOSIS — C679 Malignant neoplasm of bladder, unspecified: Secondary | ICD-10-CM

## 2020-07-12 DIAGNOSIS — N3941 Urge incontinence: Secondary | ICD-10-CM | POA: Diagnosis not present

## 2020-07-12 LAB — BLADDER SCAN AMB NON-IMAGING: Scan Result: 0

## 2020-07-12 MED ORDER — MIRABEGRON ER 25 MG PO TB24: 25.0000 mg | ORAL_TABLET | Freq: Every day | ORAL | 0 refills | Status: DC
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
## 2020-07-13 ENCOUNTER — Encounter: Payer: Self-pay | Admitting: Urology

## 2020-07-13 LAB — URINALYSIS, COMPLETE
Bilirubin, UA: NEGATIVE
Ketones, UA: NEGATIVE
Leukocytes,UA: NEGATIVE
Nitrite, UA: NEGATIVE
Protein,UA: NEGATIVE
Specific Gravity, UA: <1.005 — ABNORMAL LOW (ref 1.005–1.030)
Urobilinogen, Ur: 0.2 mg/dL (ref 0.2–1.0)
pH, UA: 5 (ref 5.0–7.5)

## 2020-07-13 LAB — MICROSCOPIC EXAMINATION: Bacteria, UA: NONE SEEN

## 2020-07-28 ENCOUNTER — Other Ambulatory Visit: Payer: Self-pay | Admitting: Urology

## 2020-07-28 DIAGNOSIS — D751 Secondary polycythemia: Secondary | ICD-10-CM

## 2020-08-03 ENCOUNTER — Other Ambulatory Visit: Payer: Self-pay | Admitting: Urology

## 2020-08-03 DIAGNOSIS — D751 Secondary polycythemia: Secondary | ICD-10-CM

## 2020-08-12 ENCOUNTER — Emergency Department: Payer: 59

## 2020-08-12 ENCOUNTER — Encounter: Payer: Self-pay | Admitting: Internal Medicine

## 2020-08-12 ENCOUNTER — Encounter: Payer: Self-pay | Admitting: Emergency Medicine

## 2020-08-12 ENCOUNTER — Emergency Department
Admission: EM | Admit: 2020-08-12 | Discharge: 2020-08-12 | Disposition: A | Discharge status: Home / Self Care | Payer: 59 | Attending: Emergency Medicine | Admitting: Emergency Medicine

## 2020-08-12 ENCOUNTER — Other Ambulatory Visit: Payer: Self-pay

## 2020-08-12 DIAGNOSIS — I10 Essential (primary) hypertension: Secondary | ICD-10-CM | POA: Diagnosis not present

## 2020-08-12 DIAGNOSIS — E119 Type 2 diabetes mellitus without complications: Secondary | ICD-10-CM | POA: Diagnosis not present

## 2020-08-12 DIAGNOSIS — Z7984 Long term (current) use of oral hypoglycemic drugs: Secondary | ICD-10-CM | POA: Insufficient documentation

## 2020-08-12 DIAGNOSIS — Z7982 Long term (current) use of aspirin: Secondary | ICD-10-CM | POA: Diagnosis not present

## 2020-08-12 DIAGNOSIS — K0889 Other specified disorders of teeth and supporting structures: Secondary | ICD-10-CM | POA: Diagnosis present

## 2020-08-12 DIAGNOSIS — Z79899 Other long term (current) drug therapy: Secondary | ICD-10-CM | POA: Diagnosis not present

## 2020-08-12 DIAGNOSIS — F17210 Nicotine dependence, cigarettes, uncomplicated: Secondary | ICD-10-CM | POA: Diagnosis not present

## 2020-08-12 DIAGNOSIS — K047 Periapical abscess without sinus: Secondary | ICD-10-CM | POA: Insufficient documentation

## 2020-08-12 DIAGNOSIS — Z794 Long term (current) use of insulin: Secondary | ICD-10-CM | POA: Diagnosis not present

## 2020-08-12 LAB — COMPREHENSIVE METABOLIC PANEL
ALT: 14 U/L (ref 0–44)
AST: 14 U/L — ABNORMAL LOW (ref 15–41)
Albumin: 3.8 g/dL (ref 3.5–5.0)
Alkaline Phosphatase: 65 U/L (ref 38–126)
Anion gap: 13 (ref 5–15)
BUN: 18 mg/dL (ref 8–23)
CO2: 23 mmol/L (ref 22–32)
Calcium: 9.2 mg/dL (ref 8.9–10.3)
Chloride: 96 mmol/L — ABNORMAL LOW (ref 98–111)
Creatinine, Ser: 1.73 mg/dL — ABNORMAL HIGH (ref 0.61–1.24)
GFR, Estimated: 43 mL/min — ABNORMAL LOW (ref >60)
Glucose, Bld: 493 mg/dL — ABNORMAL HIGH (ref 70–99)
Potassium: 4.7 mmol/L (ref 3.5–5.1)
Sodium: 132 mmol/L — ABNORMAL LOW (ref 135–145)
Total Bilirubin: 0.7 mg/dL (ref 0.3–1.2)
Total Protein: 8.1 g/dL (ref 6.5–8.1)

## 2020-08-12 LAB — CBC WITH DIFFERENTIAL/PLATELET
Abs Immature Granulocytes: 0.03 10*3/uL (ref 0.00–0.07)
Basophils Absolute: 0 10*3/uL (ref 0.0–0.1)
Basophils Relative: 0 %
Eosinophils Absolute: 0 10*3/uL (ref 0.0–0.5)
Eosinophils Relative: 0 %
HCT: 48.4 % (ref 39.0–52.0)
Hemoglobin: 17.1 g/dL — ABNORMAL HIGH (ref 13.0–17.0)
Immature Granulocytes: 0 %
Lymphocytes Relative: 14 %
Lymphs Abs: 1.3 10*3/uL (ref 0.7–4.0)
MCH: 32.9 pg (ref 26.0–34.0)
MCHC: 35.3 g/dL (ref 30.0–36.0)
MCV: 93.3 fL (ref 80.0–100.0)
Monocytes Absolute: 1 10*3/uL (ref 0.1–1.0)
Monocytes Relative: 11 %
Neutro Abs: 6.6 10*3/uL (ref 1.7–7.7)
Neutrophils Relative %: 75 %
Platelets: 216 10*3/uL (ref 150–400)
RBC: 5.19 MIL/uL (ref 4.22–5.81)
RDW: 13.2 % (ref 11.5–15.5)
WBC: 8.9 10*3/uL (ref 4.0–10.5)
nRBC: 0 % (ref 0.0–0.2)

## 2020-08-12 MED ORDER — CLINDAMYCIN HCL 150 MG PO CAPS
300.0000 mg | ORAL_CAPSULE | Freq: Once | ORAL | Status: AC
  Administered 2020-08-12: 300 mg via ORAL
  Filled 2020-08-12: qty 2

## 2020-08-12 MED ORDER — LIDOCAINE VISCOUS HCL 2 % MT SOLN: 10.0000 mL | OROMUCOSAL | 0 refills | Status: DC | PRN

## 2020-08-12 MED ORDER — IOHEXOL 300 MG/ML  SOLN
50.0000 mL | Freq: Once | INTRAMUSCULAR | Status: AC | PRN
  Administered 2020-08-12: 50 mL via INTRAVENOUS

## 2020-08-12 MED ORDER — CLINDAMYCIN HCL 300 MG PO CAPS: 300.0000 mg | ORAL_CAPSULE | Freq: Four times a day (QID) | ORAL | 0 refills | Status: AC

## 2020-08-12 NOTE — ED Triage Notes (Signed)
Pt reports pain to right upper gums and teeth since Tuesday. Swelling noted to face

## 2020-08-12 NOTE — ED Provider Notes (Signed)
[facility] Emergency Department Provider Note  ____________________________________________  Time seen: Approximately 12:43 PM  I have reviewed the triage vital signs and the nursing notes.   HISTORY  Chief Complaint Dental Pain    HPI Corey Foster is a 65 y.o. male who presents the emergency department for evaluation of right upper jaw pain.  Patient states this is progressively been worsening.  He has had purulent drainage and bleeding from this area.  He does wear partial dentures.  No fevers.  He did have facial edema that began today.  No nasal congestion, sore throat, cough.  No abdominal complaints.  Patient is tried over-the-counter medications with no relief.       Past Medical History:  Diagnosis Date   Arthritis    Diabetes mellitus without complication ([place])    DJD (degenerative joint disease)    GERD (gastroesophageal reflux disease)    Hyperlipidemia    Hypertension    Sciatic nerve pain    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   Sleep apnea     Patient Active Problem List   Diagnosis Date Noted   History of bladder cancer 09/05/2019   BMI 40.0-44.9, adult ([place]) 03/02/2017   Smoking 03/02/2017   Left-sided low back pain with left-sided sciatica 11/08/2014   Lumbar radiculopathy 11/08/2014   DDD (degenerative disc disease), lumbosacral 11/08/2014   Hip pain, chronic 11/08/2014   Sacro-iliac pain 11/08/2014   Essential hypertension 11/08/2014   Diabetes mellitus due to underlying condition with diabetic neuropathy ([place]) 11/08/2014   DDD (degenerative disc disease), cervical 11/08/2014   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   Incomplete bladder emptying 05/23/2013   Elevated prostate specific antigen (PSA) 04/12/2012   Benign prostatic hyperplasia with urinary obstruction 01/14/2012   Decreased libido 01/14/2012   Other long term (current) drug therapy 01/14/2012   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   Flu vaccine  need 01/14/2012   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   Encounter for long-term (current) use of medications 01/14/2012    Past Surgical History:  Procedure Laterality Date   CYSTOSCOPY W/ RETROGRADES Bilateral 05/31/2019   Procedure: CYSTOSCOPY WITH RETROGRADE PYELOGRAM;  Surgeon: Abbie Sons, MD;  Location: [facility] ORS;  Service: Urology;  Laterality: Bilateral;   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-C N/A 05/31/2019   Procedure: TRANSURETHRAL RESECTION OF BLADDER TUMOR WITH gemcitabine;  Surgeon: Abbie Sons, MD;  Location: [facility] ORS;  Service: Urology;  Laterality: N/A;    Prior to Admission medications   Medication Sig Start Date End Date Taking? Authorizing Provider  clindamycin (CLEOCIN) 300 MG capsule Take 1 capsule (300 mg total) by mouth 4 (four) times daily for 7 days. 08/12/20 08/19/20 Yes Nolyn Eilert, Charline Bills, PA-C  lidocaine (XYLOCAINE) 2 % solution Use as directed 10 mLs in the mouth or throat every 4 (four) hours as needed for mouth pain. Swish and spit 08/12/20  Yes Macguire Holsinger, Charline Bills, PA-C  albuterol (PROAIR HFA) 108 (90 Base) MCG/ACT inhaler Inhale 1 puff into the lungs every 6 (six) hours as needed for wheezing or shortness of breath.  02/15/13   [provider]  ALPRAZolam (XANAX XR) 1 MG 24 hr tablet Take 1 mg by mouth daily.  01/14/12   [provider]  ALPRAZolam Duanne Moron) 1 MG tablet Take 1 mg by mouth 4 (four) times daily as needed. 08/27/19   [provider]  AMBULATORY NON FORMULARY MEDICATION Trimix (30/1/10)-(Pap/Phent/PGE)  Dosage: Inject 0.3 cc and may increase 0.1cc to achieve and erection lasting  no longer than 1 hour per injection   Vial 33ml  Qty #5 refills 2  [place] [phone] Fax [phone] 12/15/19   Abbie Sons, MD  amLODipine (NORVASC) 10 MG tablet  03/16/17   [provider]  aspirin EC 325 MG tablet Take 325 mg by mouth.     [provider]  baclofen  (LIORESAL) 10 MG tablet Take 10 mg by mouth 2 (two) times daily.  10/07/13   [provider]  cholecalciferol (VITAMIN D) 400 UNITS TABS tablet Take 10,000 Units by mouth daily.     [provider]  insulin glargine (LANTUS) 100 UNIT/ML injection Inject 80 Units into the skin daily. 80 units in the am and 120 units at bedtime 01/14/12   [provider]  insulin lispro (HUMALOG) 100 UNIT/ML KiwkPen 40 Units.  10/15/17   [provider]  Insulin Pen Needle (B-D UF III MINI PEN NEEDLES) 31G X 5 MM MISC  04/28/12   [provider]  JARDIANCE 10 MG TABS tablet Take 10 mg by mouth daily. 04/11/19   [provider]  losartan (COZAAR) 100 MG tablet Take 100 mg by mouth daily.  09/25/17   [provider]  metFORMIN (GLUCOPHAGE) 1000 MG tablet Take 1,000 mg by mouth 2 (two) times daily. 04/29/19   [provider]  mirabegron ER (MYRBETRIQ) 25 MG TB24 tablet Take 1 tablet (25 mg total) by mouth daily. 07/12/20   Stoioff, Ronda Fairly, MD  Nutritional Supplements (KETO PO) Take 1 capsule by mouth.    [provider]  Omega-3 Fatty Acids (FISH OIL) 1000 MG CAPS Take 1 capsule by mouth daily.  01/14/12   [provider]  ONE TOUCH ULTRA TEST test strip  10/12/17   [provider]  Oxycodone HCl 20 MG TABS Take 20 mg by mouth 4 (four) times daily as needed (pain).  06/20/15   [provider]  sildenafil (REVATIO) 20 MG tablet TAKE 2 TO 5 TABLETS BY MOUTH AS NEEDED FOR ERECTILE DYSFUNCTION 08/02/20   Stoioff, Ronda Fairly, MD  simvastatin (ZOCOR) 40 MG tablet Take 1 tablet by mouth daily. 07/07/16   [provider]  Testosterone 20.25 MG/ACT (1.62%) GEL APPLY 4 APPLICATIONS TOPICALLY DAILY. 02/25/20   Abbie Sons, MD  TRULICITY 5.88 TG/5.4DI SOPN  11/05/17   [provider]  vitamin C (ASCORBIC ACID) 250 MG tablet Take 250 mg by mouth daily.    [provider]  zolpidem (AMBIEN) 10 MG tablet Take 10 mg  by mouth at bedtime as needed. 04/18/19   [provider]    Allergies Patient has no known allergies.  No family history on file.  Social History Social History   Tobacco Use   Smoking status: Every Day    Packs/day: 0.25    Years: 40.00    Pack years: 10.00    Types: Cigarettes   Smokeless tobacco: Never  Vaping Use   Vaping Use: Never used  Substance Use Topics   Alcohol use: Yes    Alcohol/week: 3.0 standard drinks    Types: 3 Cans of beer per week   Drug use: Yes    Types: Cocaine, Marijuana     Review of Systems  Constitutional: No fever/chills Eyes: No visual changes. No discharge ENT: Right upper dental pain with facial swelling. Cardiovascular: no chest pain. Respiratory: no cough. No SOB. Gastrointestinal: No abdominal pain.  No nausea, no vomiting.  No diarrhea.  No constipation. Musculoskeletal: Negative  for musculoskeletal pain. Skin: Negative for rash, abrasions, lacerations, ecchymosis. Neurological: Negative for headaches, focal weakness or numbness.  10 System ROS otherwise negative.  ____________________________________________   PHYSICAL EXAM:  VITAL SIGNS: ED Triage Vitals  Enc Vitals Group     BP 08/12/20 0957 (!) 139/93     Pulse Rate 08/12/20 0957 (!) 109     Resp 08/12/20 0957 18     Temp 08/12/20 0957 99.3 F (37.4 C)     Temp Source 08/12/20 0957 Oral     SpO2 08/12/20 0957 92 %     Weight 08/12/20 0959 292 lb (132.5 kg)     Height 08/12/20 0959 5\' 11"  (1.803 m)     Head Circumference --      Peak Flow --      Pain Score 08/12/20 0959 10     Pain Loc --      Pain Edu? --      Excl. in [place]? --      Constitutional: Alert and oriented. Well appearing and in no acute distress. Eyes: Conjunctivae are normal. PERRL. EOMI. Head: Atraumatic. ENT:      Ears:       Nose: No congestion/rhinnorhea.      Mouth/Throat: Mucous membranes are moist.  Visualization of the oropharynx reveals right upper tooth that appears loose  in nature.  There is a flattened area consistent with previously visualized abscess.  Appears that this has spontaneously drained.  Mild edema to the cheek.  Uvula is midline. Neck: No stridor.  No erythema, edema, tenderness along the neck. Hematological/Lymphatic/Immunilogical: No cervical lymphadenopathy. Cardiovascular: Normal rate, regular rhythm. Normal S1 and S2.  Good peripheral circulation. Respiratory: Normal respiratory effort without tachypnea or retractions. Lungs CTAB. Good air entry to the bases with no decreased or absent breath sounds. Musculoskeletal: Full range of motion to all extremities. No gross deformities appreciated. Neurologic:  Normal speech and language. No gross focal neurologic deficits are appreciated.  Skin:  Skin is warm, dry and intact. No rash noted. Psychiatric: Mood and affect are normal. Speech and behavior are normal. Patient exhibits appropriate insight and judgement.   ____________________________________________   LABS (all labs ordered are listed, but only abnormal results are displayed)  Labs Reviewed  COMPREHENSIVE METABOLIC PANEL - Abnormal; Notable for the following components:      Result Value   Sodium 132 (*)    Chloride 96 (*)    Glucose, Bld 493 (*)    Creatinine, Ser 1.73 (*)    AST 14 (*)    GFR, Estimated 43 (*)    All other components within normal limits  CBC WITH DIFFERENTIAL/PLATELET - Abnormal; Notable for the following components:   Hemoglobin 17.1 (*)    All other components within normal limits   ____________________________________________  EKG   ____________________________________________  RADIOLOGY I personally viewed and evaluated these images as part of my medical decision making, as well as reviewing the written report by the radiologist.  ED Provider Interpretation: Findings consistent with dental infection.  Periapical abscess has drained spontaneously while here in the emergency department.  There is no  further loculated fluid collection concerning for deeper or remaining abscess.  CT Maxillofacial W Contrast  Result Date: 08/12/2020 CLINICAL DATA:  65 year old male with right upper dental abscess for 5 days. Progressive symptoms. Acute onset facial swelling. EXAM: CT MAXILLOFACIAL WITH CONTRAST TECHNIQUE: Multidetector CT imaging of the maxillofacial structures was performed with intravenous contrast. Multiplanar CT image reconstructions were also generated. CONTRAST:  32mL OMNIPAQUE IOHEXOL 300 MG/ML  SOLN COMPARISON:  Head and face CT 09/25/2013. FINDINGS: Osseous: Mandible intact and normally located. Regarding maxillary dentition there is a left maxillary bicuspid with periapical lucency on series 7, image 63. On the right there is periapical lucency and somewhat floating tooth appearance of the residual incisor (series 3, image 53) No other acute osseous abnormality identified. Orbits: Intact orbital walls.  Normal orbits soft tissues. Sinuses: Clear throughout. Soft tissues: Negative visible thyroid, larynx, pharynx, parapharyngeal spaces, retropharyngeal space, sublingual space, submandibular spaces, parotid spaces, and masticator spaces. Asymmetric periorbital soft tissue swelling greater on the right. Subtle soft tissue lucency about the right maxillary lateral incisor but no convincing subperiosteal abscess at this time (series 2, image 52). No soft tissue gas. No upper cervical lymphadenopathy. Limited intracranial: Major vascular structures in the neck and at the skull base are patent. Bulky calcified plaque at the left ICA origin. Bilateral siphon calcified plaque. Negative visible brain parenchyma. IMPRESSION: 1. Right maxillary lateral incisor periapical lucency with regional soft tissue swelling but no convincing abscess or other complicating features. 2. No other acute findings in the Face. Carious left maxillary bicuspid. Calcified carotid atherosclerosis. Electronically Signed   By: Genevie Ann  M.D.   On: 08/12/2020 11:50    ____________________________________________    PROCEDURES  Procedure(s) performed:    Procedures    Medications  clindamycin (CLEOCIN) capsule 300 mg (has no administration in time range)  iohexol (OMNIPAQUE) 300 MG/ML solution 50 mL (50 mLs Intravenous Contrast Given 08/12/20 1124)     ____________________________________________   INITIAL IMPRESSION / ASSESSMENT AND PLAN / ED COURSE  Pertinent labs & imaging results that were available during my care of the patient were reviewed by me and considered in my medical decision making (see chart for details).  Review of the [place] CSRS was performed in accordance of the [place] prior to dispensing any controlled drugs.           Patient's diagnosis is consistent with dental infection.  Patient presented to the emergency department complaining of right upper dental pain and swelling.  Pain has been increasing over the last 5 days and swelling began today.  Patient had a visualized periapical abscess in triage which began to drain spontaneously.  There is no area concerning for residual fluid collection requiring incision and drainage at this time.  Patiently placed on antibiotics with viscous lidocaine for apparently.  Patient is already on chronic narcotic therapy.  Follow-up with primary care or dentist as needed. Patient is given ED precautions to return to the ED for any worsening or new symptoms.     ____________________________________________  FINAL CLINICAL IMPRESSION(S) / ED DIAGNOSES  Final diagnoses:  Dental abscess      NEW MEDICATIONS STARTED DURING THIS VISIT:  ED Discharge Orders          Ordered    clindamycin (CLEOCIN) 300 MG capsule  4 times daily        08/12/20 1304    lidocaine (XYLOCAINE) 2 % solution  Every 4 hours PRN        08/12/20 1304                This chart was dictated using voice recognition software/Dragon. Despite best efforts to proofread,  errors can occur which can change the meaning. Any change was purely unintentional.    Darletta Moll, PA-C 08/12/20 1305    [name], MD 08/12/20 1527

## 2020-08-12 NOTE — ED Provider Notes (Signed)
Emergency Medicine Provider Triage Evaluation Note  Corey Foster , a 65 y.o. male  was evaluated in triage.  Pt complains of dental abscess of the upper right jaw for 5 days, has been worsening. He now reports bleeding from the area. Wears partial dentures at baseline, this abscess is at the site of a loose tooth. Denies fevers at home, but reports facial swelling that began today. Denies pain under the tongue, swelling of the tongue or throat.  Review of Systems  Positive: Dental pain, loose tooth, bleeding gum line, facial swelling Negative: fevers  Physical Exam  BP (!) 139/93 (BP Location: Left Arm)   Pulse (!) 109   Temp 99.3 F (37.4 C) (Oral)   Resp 18   Ht 5\' 11"  (1.803 m)   Wt 132.5 kg   SpO2 92%   BMI 40.73 kg/m  Gen:   Awake, no distress   Resp:  Normal effort  Maxillofacial:  Loose upper right tooth with surrounding periapical abscess that spontaneously began draining in triage with foul smelling purulent discharge. Surrounding mild to moderate upper lip swelling and tenderness. MSK:   Moves extremities without difficulty Other:    Medical Decision Making  Medically screening exam initiated at 10:14 AM.  Appropriate orders placed.  Corey Foster was informed that the remainder of the evaluation will be completed by another provider, this initial triage assessment does not replace that evaluation, and the importance of remaining in the ED until their evaluation is complete.    Corey Salvage, PA 08/12/20 1031    Corey Divine, MD 08/12/20 [phone]

## 2020-08-12 NOTE — ED Notes (Signed)
Pt to room in wheelchair. Py in NAD. Pt laughing and joking with RN. Pt advised tooth has been bothering him since Tuesday. Pt does not have a regular dentist to call so he came here.

## 2020-08-16 ENCOUNTER — Ambulatory Visit (INDEPENDENT_AMBULATORY_CARE_PROVIDER_SITE_OTHER): Payer: 59 | Admitting: Urology

## 2020-08-16 ENCOUNTER — Other Ambulatory Visit: Payer: Self-pay

## 2020-08-16 ENCOUNTER — Encounter: Payer: Self-pay | Admitting: Urology

## 2020-08-16 VITALS — BP 113/76 | HR 94 | Ht 71.0 in | Wt 287.0 lb

## 2020-08-16 DIAGNOSIS — C679 Malignant neoplasm of bladder, unspecified: Secondary | ICD-10-CM | POA: Diagnosis not present

## 2020-08-16 LAB — URINALYSIS, COMPLETE
Bilirubin, UA: NEGATIVE
Leukocytes,UA: NEGATIVE
Nitrite, UA: NEGATIVE
Protein,UA: NEGATIVE
RBC, UA: NEGATIVE
Specific Gravity, UA: 1.01 (ref 1.005–1.030)
Urobilinogen, Ur: 0.2 mg/dL (ref 0.2–1.0)
pH, UA: 6.5 (ref 5.0–7.5)

## 2020-08-16 LAB — MICROSCOPIC EXAMINATION: Bacteria, UA: NONE SEEN

## 2020-08-16 NOTE — Progress Notes (Signed)
   08/16/20  CC:  Chief Complaint  Patient presents with   Cysto    Indications: 1.  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  Seen last month for urinary incontinence with significant improvement on Myrbetriq  Blood pressure 113/76, pulse 94, height 5\' 11"  (1.803 m), weight 287 lb (130.2 kg). NED. A&Ox3.   No respiratory distress   Abd soft, NT, ND Normal phallus with bilateral descended testicles  Cystoscopy Procedure Note  Patient identification was confirmed, informed consent was obtained, and patient was prepped using Betadine solution.  Lidocaine jelly was administered per urethral meatus.     Pre-Procedure: - Inspection reveals a normal caliber urethral meatus.  Procedure: The flexible cystoscope was introduced without difficulty - No urethral strictures/lesions are present. -  Moderate lateral lobe enlargement  prostate  -  Mild elevation  bladder neck - Bilateral ureteral orifices identified - Bladder mucosa  reveals no ulcers, tumors, or lesions - No bladder stones - No trabeculation  Retroflexion shows no intravesical median lobe or lesion   Post-Procedure: - Patient tolerated the procedure well  Assessment/ Plan: No evidence recurrent urothelial carcinoma Surveillance cystoscopy 1 year    Abbie Sons, MD

## 2020-10-09 ENCOUNTER — Other Ambulatory Visit: Payer: Self-pay | Admitting: Urology

## 2020-10-09 DIAGNOSIS — D751 Secondary polycythemia: Secondary | ICD-10-CM

## 2020-10-11 ENCOUNTER — Other Ambulatory Visit: Payer: Self-pay | Admitting: Urology

## 2020-10-11 DIAGNOSIS — D751 Secondary polycythemia: Secondary | ICD-10-CM

## 2020-10-11 NOTE — Telephone Encounter (Signed)
Refill sent.  Please schedule lab visit for testosterone, PSA and hematocrit this month.  Also needs a lab visit 6 months for testosterone and hematocrit

## 2020-10-11 NOTE — Telephone Encounter (Signed)
Notified patient as instructed, patient pleased. Discussed follow-up appointments, patient agrees  

## 2020-10-22 ENCOUNTER — Other Ambulatory Visit: Payer: Self-pay

## 2020-10-22 DIAGNOSIS — E291 Testicular hypofunction: Secondary | ICD-10-CM

## 2020-10-23 ENCOUNTER — Other Ambulatory Visit: Payer: 59

## 2020-10-26 ENCOUNTER — Other Ambulatory Visit: Payer: Self-pay

## 2020-10-26 ENCOUNTER — Encounter: Payer: Self-pay | Admitting: Urology

## 2020-10-26 ENCOUNTER — Encounter: Payer: Self-pay | Admitting: Emergency Medicine

## 2020-10-26 ENCOUNTER — Emergency Department
Admission: EM | Admit: 2020-10-26 | Discharge: 2020-10-26 | Disposition: A | Discharge status: Home / Self Care | Payer: 59 | Attending: Emergency Medicine | Admitting: Emergency Medicine

## 2020-10-26 DIAGNOSIS — Z7984 Long term (current) use of oral hypoglycemic drugs: Secondary | ICD-10-CM | POA: Diagnosis not present

## 2020-10-26 DIAGNOSIS — Z794 Long term (current) use of insulin: Secondary | ICD-10-CM | POA: Insufficient documentation

## 2020-10-26 DIAGNOSIS — K0889 Other specified disorders of teeth and supporting structures: Secondary | ICD-10-CM | POA: Insufficient documentation

## 2020-10-26 DIAGNOSIS — I10 Essential (primary) hypertension: Secondary | ICD-10-CM | POA: Diagnosis not present

## 2020-10-26 DIAGNOSIS — F17210 Nicotine dependence, cigarettes, uncomplicated: Secondary | ICD-10-CM | POA: Insufficient documentation

## 2020-10-26 DIAGNOSIS — E119 Type 2 diabetes mellitus without complications: Secondary | ICD-10-CM | POA: Insufficient documentation

## 2020-10-26 DIAGNOSIS — Z7982 Long term (current) use of aspirin: Secondary | ICD-10-CM | POA: Insufficient documentation

## 2020-10-26 DIAGNOSIS — Z79899 Other long term (current) drug therapy: Secondary | ICD-10-CM | POA: Diagnosis not present

## 2020-10-26 MED ORDER — LIDOCAINE VISCOUS HCL 2 % MT SOLN: 5.0000 mL | Freq: Four times a day (QID) | OROMUCOSAL | 0 refills | Status: DC | PRN

## 2020-10-26 MED ORDER — CLINDAMYCIN HCL 300 MG PO CAPS: 300.0000 mg | ORAL_CAPSULE | Freq: Three times a day (TID) | ORAL | 0 refills | Status: AC

## 2020-10-26 NOTE — ED Triage Notes (Signed)
Presents with dental pain    states he bit down on something and developed pain to right upper gum area

## 2020-10-26 NOTE — Discharge Instructions (Addendum)
Read and follow discharge care instructions.  Take medication as directed.  Advised to follow-up with treating dentist.

## 2020-10-26 NOTE — ED Provider Notes (Signed)
[facility] Emergency Department Provider Note   ____________________________________________   Event Date/Time   First MD Initiated Contact with Patient 10/26/20 [phone]     (approximate)  I have reviewed the triage vital signs and the nursing notes.   HISTORY  Chief Complaint Dental Pain    HPI Corey Foster is a 65 y.o. male patient complain of dental pain to the right upper gum area.  Patient that he bit down on something hard 4 days ago.  Patient stated moderate discomfort wearing dentures since incident.  Denies fever associated complaint.  Denies drainage.  Patient had a recent dental abscess 3 months ago.  Patient is not follow-up with dentist in over a year.  Rates pain as a 7/10.  Described pain as "sore".  No palliative measure for complaint.         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   Sleep apnea     Patient Active Problem List   Diagnosis Date Noted   History of bladder cancer 09/05/2019   BMI 40.0-44.9, adult ([place]) 03/02/2017   Smoking 03/02/2017   Left-sided low back pain with left-sided sciatica 11/08/2014   Lumbar radiculopathy 11/08/2014   DDD (degenerative disc disease), lumbosacral 11/08/2014   Hip pain, chronic 11/08/2014   Sacro-iliac pain 11/08/2014   Essential hypertension 11/08/2014   Diabetes mellitus due to underlying condition with diabetic neuropathy ([place]) 11/08/2014   DDD (degenerative disc disease), cervical 11/08/2014   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   Incomplete bladder emptying 05/23/2013   Elevated prostate specific antigen (PSA) 04/12/2012   Benign prostatic hyperplasia with urinary obstruction 01/14/2012   Decreased libido 01/14/2012   Other long term (current) drug  therapy 01/14/2012   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   Flu vaccine need 01/14/2012   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   Encounter for long-term (current) use of medications 01/14/2012    Past Surgical History:  Procedure Laterality Date   CYSTOSCOPY W/ RETROGRADES Bilateral 05/31/2019   Procedure: CYSTOSCOPY WITH RETROGRADE PYELOGRAM;  Surgeon: Abbie Sons, MD;  Location: [facility] ORS;  Service: Urology;  Laterality: Bilateral;   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-C N/A 05/31/2019   Procedure: TRANSURETHRAL RESECTION OF BLADDER TUMOR WITH gemcitabine;  Surgeon: Abbie Sons, MD;  Location: [facility] ORS;  Service: Urology;  Laterality: N/A;    Prior to Admission medications   Medication Sig Start Date End Date Taking? Authorizing Provider  clindamycin (CLEOCIN) 300 MG capsule Take 1 capsule (300 mg total) by mouth 3 (three) times daily for 10 days. 10/26/20 11/05/20 Yes Sable Feil, PA-C  lidocaine (XYLOCAINE) 2 % solution Use as directed 5 mLs in the mouth or throat every 6 (six) hours as needed for mouth pain. 10/26/20  Yes Sable Feil, PA-C  albuterol (VENTOLIN HFA) 108 (90 Base) MCG/ACT inhaler Inhale 1 puff into the lungs every 6 (six) hours as needed for wheezing or shortness of breath.  02/15/13   [provider]  ALPRAZolam (XANAX XR) 1 MG 24 hr tablet Take 1 mg by mouth daily.  01/14/12   [provider]  ALPRAZolam Duanne Moron) 1 MG tablet Take 1 mg by mouth 4 (four) times daily as needed. 08/27/19   [provider]  AMBULATORY NON FORMULARY MEDICATION Trimix (30/1/10)-(Pap/Phent/PGE)  Dosage: Inject 0.3 cc and may increase 0.1cc to achieve and erection lasting no longer than 1 hour per injection   Vial 23m  Qty #5 refills 2  [phone] [phone]/21/21   SAbbie Sons MD  amLODipine (NORVASC) 10 MG tablet  03/16/17   [provider]  aspirin EC 325 MG tablet Take  325 mg by mouth.     [provider]  baclofen (LIORESAL) 10 MG tablet Take 10 mg by mouth 2 (two) times daily.  10/07/13   [provider]  cholecalciferol (VITAMIN D) 400 UNITS TABS tablet Take 10,000 Units by mouth daily.     [provider]  insulin glargine (LANTUS) 100 UNIT/ML injection Inject 80 Units into the skin daily. 80 units in the am and 120 units at bedtime 01/14/12   [provider]  insulin lispro (HUMALOG) 100 UNIT/ML KiwkPen 40 Units.  10/15/17   [provider]  Insulin Pen Needle 31G X 5 MM MISC  04/28/12   [provider]  JARDIANCE 10 MG TABS tablet Take 10 mg by mouth daily. 04/11/19   [provider]  lidocaine (XYLOCAINE) 2 % solution Use as directed 10 mLs in the mouth or throat every 4 (four) hours as needed for mouth pain. Swish and spit 08/12/20   Cuthriell, JCharline Bills PA-C  losartan (COZAAR) 100 MG tablet Take 100 mg by mouth daily.  09/25/17   [provider]  metFORMIN (GLUCOPHAGE) 1000 MG tablet Take 1,000 mg by mouth 2 (two) times daily. 04/29/19   [provider]  mirabegron ER (MYRBETRIQ) 25 MG TB24 tablet Take 1 tablet (25 mg total) by mouth daily. 07/12/20   Stoioff, SRonda Fairly MD  Nutritional Supplements (KETO PO) Take 1 capsule by mouth.    [provider]  Omega-3 Fatty Acids (FISH OIL) 1000 MG CAPS Take 1 capsule by mouth daily.  01/14/12   [provider]  ONE TOUCH ULTRA TEST test strip  10/12/17   [provider]  Oxycodone HCl 20 MG TABS Take 20 mg by mouth 4 (four) times daily as needed (pain).  06/20/15   [provider]  sildenafil (REVATIO) 20 MG tablet TAKE 2 TO 5 TABLETS BY MOUTH AS NEEDED FOR ERECTILE DYSFUNCTION 08/02/20   Stoioff, SRonda Fairly MD  simvastatin (ZOCOR) 40 MG tablet Take 1 tablet by mouth daily. 07/07/16   [provider]  Testosterone 20.25 MG/ACT (1.62%) GEL APPLY 4 APPLICATIONS TOPICALLY DAILY. 10/11/20   SAbbie Sons MD   TRULICITY [id number]  11/05/17   [provider]  vitamin C (ASCORBIC ACID) 250 MG tablet Take 250 mg by mouth daily.    [provider]  zolpidem (AMBIEN) 10 MG tablet Take 10 mg by mouth at bedtime as needed. 04/18/19   [provider]    Allergies Patient has no known allergies.  History reviewed. No pertinent family history.  Social History Social History   Tobacco Use   Smoking status: Every Day    Packs/day: 0.25    Years: 40.00    Pack years: 10.00    Types: Cigarettes   Smokeless tobacco: Never  Vaping Use   Vaping Use: Never used  Substance Use Topics   Alcohol use: Yes    Alcohol/week: 3.0 standard drinks    Types: 3 Cans of beer per week   Drug use: Yes    Types:  Cocaine, Marijuana    Review of Systems  Constitutional: No fever/chills Eyes: No visual changes. ENT: No sore throat.  Right maxillary dental pain. Cardiovascular: Denies chest pain. Respiratory: Denies shortness of breath. Gastrointestinal: No abdominal pain.  No nausea, no vomiting.  No diarrhea.  No constipation. Genitourinary: Negative for dysuria. Musculoskeletal: Negative for back pain. Skin: Negative for rash. Neurological: Negative for headaches, focal weakness or numbness. Endocrine: Diabetes, hyperlipidemia, hypertension.    ____________________________________________   PHYSICAL EXAM:  VITAL SIGNS: ED Triage Vitals  Enc Vitals Group     BP 10/26/20 0939 107/79     Pulse Rate 10/26/20 0939 96     Resp 10/26/20 0939 18     Temp 10/26/20 0939 98.8 F (37.1 C)     Temp Source 10/26/20 0939 Oral     SpO2 10/26/20 0939 100 %     Weight 10/26/20 0935 286 lb 9.6 oz (130 kg)     Height 10/26/20 0935 '5\' 11"'$  (1.803 m)     Head Circumference --      Peak Flow --      Pain Score 10/26/20 0941 7     Pain Loc --      Pain Edu? --      Excl. in [place]? --     Constitutional: Alert and oriented. Well appearing and in no acute distress. Eyes:  Conjunctivae are normal. PERRL. EOMI. Head: Atraumatic. Nose: No congestion/rhinnorhea. Mouth/Throat: Mucous membranes are moist.  Oropharynx non-erythematous.  Edematous gingiva right maxillary area.  No visible or palpable abscess.  Uvula is midline.   Neck: No stridor.   Hematological/Lymphatic/Immunilogical: No cervical lymphadenopathy. Cardiovascular: Normal rate, regular rhythm. Grossly normal heart sounds.  Good peripheral circulation. Respiratory: Normal respiratory effort.  No retractions. Lungs CTAB. Neurologic:  Normal speech and language. No gross focal neurologic deficits are appreciated. No gait instability. Skin:  Skin is warm, dry and intact. No rash noted. Psychiatric: Mood and affect are normal. Speech and behavior are normal.  ____________________________________________   LABS (all labs ordered are listed, but only abnormal results are displayed)  Labs Reviewed - No data to display ____________________________________________  EKG   ____________________________________________  RADIOLOGY I, Sable Feil, personally viewed and evaluated these images (plain radiographs) as part of my medical decision making, as well as reviewing the written report by the radiologist.  ED MD interpretation:    Official radiology report(s): No results found.  ____________________________________________   PROCEDURES  Procedure(s) performed (including Critical Care):  Procedures   ____________________________________________   INITIAL IMPRESSION / ASSESSMENT AND PLAN / ED COURSE  As part of my medical decision making, I reviewed the following data within the [place]         Patient presents with dental pain right maxillary area.  Right maxillary gingiva is edematous and erythematous but no palpable or visible abscess.  Patient given discharge care instructions and prescription for clindamycin and viscous lidocaine.  Patient advised to follow-up  with treating dentist.  Return to ED if condition worsens.      ____________________________________________   FINAL CLINICAL IMPRESSION(S) / ED DIAGNOSES  Final diagnoses:  Pain, dental     ED Discharge Orders          Ordered    clindamycin (CLEOCIN) 300 MG capsule  3 times daily        10/26/20 0956    lidocaine (XYLOCAINE) 2 % solution  Every 6 hours PRN        10/26/20 0956  Note:  This document was prepared using Dragon voice recognition software and may include unintentional dictation errors.    Sable Feil, PA-C 10/26/20 1003    [name], MD 10/26/20 [phone]

## 2020-11-16 ENCOUNTER — Other Ambulatory Visit: Payer: Self-pay | Admitting: Urology

## 2020-11-16 DIAGNOSIS — D751 Secondary polycythemia: Secondary | ICD-10-CM

## 2021-01-16 ENCOUNTER — Other Ambulatory Visit: Payer: Self-pay | Admitting: Urology

## 2021-01-16 DIAGNOSIS — D751 Secondary polycythemia: Secondary | ICD-10-CM

## 2021-02-07 ENCOUNTER — Other Ambulatory Visit: Payer: Self-pay

## 2021-02-07 ENCOUNTER — Emergency Department: Payer: 59

## 2021-02-07 ENCOUNTER — Observation Stay
Admission: EM | Admit: 2021-02-07 | Discharge: 2021-02-08 | Disposition: A | Discharge status: Home / Self Care | Payer: 59 | Attending: Internal Medicine | Admitting: Internal Medicine

## 2021-02-07 DIAGNOSIS — N401 Enlarged prostate with lower urinary tract symptoms: Secondary | ICD-10-CM | POA: Insufficient documentation

## 2021-02-07 DIAGNOSIS — E1110 Type 2 diabetes mellitus with ketoacidosis without coma: Secondary | ICD-10-CM | POA: Diagnosis not present

## 2021-02-07 DIAGNOSIS — R39198 Other difficulties with micturition: Secondary | ICD-10-CM | POA: Diagnosis not present

## 2021-02-07 DIAGNOSIS — N1831 Chronic kidney disease, stage 3a: Secondary | ICD-10-CM | POA: Insufficient documentation

## 2021-02-07 DIAGNOSIS — Y92009 Unspecified place in unspecified non-institutional (private) residence as the place of occurrence of the external cause: Secondary | ICD-10-CM

## 2021-02-07 DIAGNOSIS — I10 Essential (primary) hypertension: Secondary | ICD-10-CM | POA: Diagnosis present

## 2021-02-07 DIAGNOSIS — E785 Hyperlipidemia, unspecified: Secondary | ICD-10-CM | POA: Insufficient documentation

## 2021-02-07 DIAGNOSIS — E1129 Type 2 diabetes mellitus with other diabetic kidney complication: Secondary | ICD-10-CM | POA: Diagnosis present

## 2021-02-07 DIAGNOSIS — K219 Gastro-esophageal reflux disease without esophagitis: Secondary | ICD-10-CM | POA: Insufficient documentation

## 2021-02-07 DIAGNOSIS — Z7982 Long term (current) use of aspirin: Secondary | ICD-10-CM | POA: Insufficient documentation

## 2021-02-07 DIAGNOSIS — E1122 Type 2 diabetes mellitus with diabetic chronic kidney disease: Secondary | ICD-10-CM | POA: Diagnosis not present

## 2021-02-07 DIAGNOSIS — F17210 Nicotine dependence, cigarettes, uncomplicated: Secondary | ICD-10-CM | POA: Insufficient documentation

## 2021-02-07 DIAGNOSIS — N179 Acute kidney failure, unspecified: Secondary | ICD-10-CM

## 2021-02-07 DIAGNOSIS — I129 Hypertensive chronic kidney disease with stage 1 through stage 4 chronic kidney disease, or unspecified chronic kidney disease: Secondary | ICD-10-CM | POA: Diagnosis not present

## 2021-02-07 DIAGNOSIS — E1010 Type 1 diabetes mellitus with ketoacidosis without coma: Secondary | ICD-10-CM | POA: Diagnosis not present

## 2021-02-07 DIAGNOSIS — F419 Anxiety disorder, unspecified: Secondary | ICD-10-CM | POA: Insufficient documentation

## 2021-02-07 DIAGNOSIS — F17200 Nicotine dependence, unspecified, uncomplicated: Secondary | ICD-10-CM | POA: Diagnosis present

## 2021-02-07 DIAGNOSIS — N138 Other obstructive and reflux uropathy: Secondary | ICD-10-CM

## 2021-02-07 DIAGNOSIS — Z79899 Other long term (current) drug therapy: Secondary | ICD-10-CM | POA: Insufficient documentation

## 2021-02-07 DIAGNOSIS — Z20822 Contact with and (suspected) exposure to covid-19: Secondary | ICD-10-CM | POA: Insufficient documentation

## 2021-02-07 DIAGNOSIS — Z794 Long term (current) use of insulin: Secondary | ICD-10-CM | POA: Diagnosis not present

## 2021-02-07 DIAGNOSIS — W19XXXA Unspecified fall, initial encounter: Secondary | ICD-10-CM

## 2021-02-07 DIAGNOSIS — R197 Diarrhea, unspecified: Secondary | ICD-10-CM

## 2021-02-07 DIAGNOSIS — E875 Hyperkalemia: Secondary | ICD-10-CM | POA: Diagnosis present

## 2021-02-07 DIAGNOSIS — G4733 Obstructive sleep apnea (adult) (pediatric): Secondary | ICD-10-CM | POA: Diagnosis not present

## 2021-02-07 DIAGNOSIS — R739 Hyperglycemia, unspecified: Secondary | ICD-10-CM

## 2021-02-07 DIAGNOSIS — E860 Dehydration: Secondary | ICD-10-CM | POA: Diagnosis not present

## 2021-02-07 DIAGNOSIS — R531 Weakness: Secondary | ICD-10-CM | POA: Diagnosis present

## 2021-02-07 DIAGNOSIS — R7989 Other specified abnormal findings of blood chemistry: Secondary | ICD-10-CM

## 2021-02-07 LAB — COMPREHENSIVE METABOLIC PANEL
ALT: 13 U/L (ref 0–44)
AST: 12 U/L — ABNORMAL LOW (ref 15–41)
Albumin: 3.7 g/dL (ref 3.5–5.0)
Alkaline Phosphatase: 80 U/L (ref 38–126)
Anion gap: 21 — ABNORMAL HIGH (ref 5–15)
BUN: 66 mg/dL — ABNORMAL HIGH (ref 8–23)
CO2: 20 mmol/L — ABNORMAL LOW (ref 22–32)
Calcium: 8.8 mg/dL — ABNORMAL LOW (ref 8.9–10.3)
Chloride: 80 mmol/L — ABNORMAL LOW (ref 98–111)
Creatinine, Ser: 3.6 mg/dL — ABNORMAL HIGH (ref 0.61–1.24)
GFR, Estimated: 18 mL/min — ABNORMAL LOW (ref >60)
Glucose, Bld: 655 mg/dL (ref 70–99)
Potassium: 6.6 mmol/L (ref 3.5–5.1)
Sodium: 121 mmol/L — ABNORMAL LOW (ref 135–145)
Total Bilirubin: 1.6 mg/dL — ABNORMAL HIGH (ref 0.3–1.2)
Total Protein: 7.9 g/dL (ref 6.5–8.1)

## 2021-02-07 LAB — URINALYSIS, ROUTINE W REFLEX MICROSCOPIC
Glucose, UA: >1000 mg/dL — AB
Hgb urine dipstick: NEGATIVE
Ketones, ur: 15 mg/dL — AB
Leukocytes,Ua: NEGATIVE
Nitrite: NEGATIVE
Protein, ur: NEGATIVE mg/dL
Specific Gravity, Urine: 1.015 (ref 1.005–1.030)
pH: 5.5 (ref 5.0–8.0)

## 2021-02-07 LAB — BASIC METABOLIC PANEL
Anion gap: 18 — ABNORMAL HIGH (ref 5–15)
Anion gap: 10 (ref 5–15)
BUN: 71 mg/dL — ABNORMAL HIGH (ref 8–23)
BUN: 63 mg/dL — ABNORMAL HIGH (ref 8–23)
CO2: 19 mmol/L — ABNORMAL LOW (ref 22–32)
CO2: 24 mmol/L (ref 22–32)
Calcium: 8.5 mg/dL — ABNORMAL LOW (ref 8.9–10.3)
Calcium: 8.8 mg/dL — ABNORMAL LOW (ref 8.9–10.3)
Chloride: 90 mmol/L — ABNORMAL LOW (ref 98–111)
Chloride: 95 mmol/L — ABNORMAL LOW (ref 98–111)
Creatinine, Ser: 3.23 mg/dL — ABNORMAL HIGH (ref 0.61–1.24)
Creatinine, Ser: 2.75 mg/dL — ABNORMAL HIGH (ref 0.61–1.24)
GFR, Estimated: 20 mL/min — ABNORMAL LOW (ref >60)
GFR, Estimated: 25 mL/min — ABNORMAL LOW (ref >60)
Glucose, Bld: 525 mg/dL (ref 70–99)
Glucose, Bld: 178 mg/dL — ABNORMAL HIGH (ref 70–99)
Potassium: 5.1 mmol/L (ref 3.5–5.1)
Potassium: 4.5 mmol/L (ref 3.5–5.1)
Sodium: 127 mmol/L — ABNORMAL LOW (ref 135–145)
Sodium: 129 mmol/L — ABNORMAL LOW (ref 135–145)

## 2021-02-07 LAB — CBC WITH DIFFERENTIAL/PLATELET
Abs Immature Granulocytes: 0.05 10*3/uL (ref 0.00–0.07)
Basophils Absolute: 0 10*3/uL (ref 0.0–0.1)
Basophils Relative: 0 %
Eosinophils Absolute: 0 10*3/uL (ref 0.0–0.5)
Eosinophils Relative: 0 %
HCT: 52.7 % — ABNORMAL HIGH (ref 39.0–52.0)
Hemoglobin: 18.9 g/dL — ABNORMAL HIGH (ref 13.0–17.0)
Immature Granulocytes: 1 %
Lymphocytes Relative: 13 %
Lymphs Abs: 1.1 10*3/uL (ref 0.7–4.0)
MCH: 32.9 pg (ref 26.0–34.0)
MCHC: 35.9 g/dL (ref 30.0–36.0)
MCV: 91.8 fL (ref 80.0–100.0)
Monocytes Absolute: 0.8 10*3/uL (ref 0.1–1.0)
Monocytes Relative: 9 %
Neutro Abs: 6.8 10*3/uL (ref 1.7–7.7)
Neutrophils Relative %: 77 %
Platelets: 269 10*3/uL (ref 150–400)
RBC: 5.74 MIL/uL (ref 4.22–5.81)
RDW: 12.4 % (ref 11.5–15.5)
WBC: 8.8 10*3/uL (ref 4.0–10.5)
nRBC: 0 % (ref 0.0–0.2)

## 2021-02-07 LAB — RESP PANEL BY RT-PCR (FLU A&B, COVID) ARPGX2
Influenza A by PCR: NEGATIVE
Influenza B by PCR: NEGATIVE
SARS Coronavirus 2 by RT PCR: NEGATIVE

## 2021-02-07 LAB — URINE DRUG SCREEN, QUALITATIVE (ARMC ONLY)
Amphetamines, Ur Screen: NOT DETECTED
Barbiturates, Ur Screen: NOT DETECTED
Benzodiazepine, Ur Scrn: POSITIVE — AB
Cannabinoid 50 Ng, Ur ~~LOC~~: NOT DETECTED
Cocaine Metabolite,Ur ~~LOC~~: NOT DETECTED
MDMA (Ecstasy)Ur Screen: NOT DETECTED
Methadone Scn, Ur: NOT DETECTED
Opiate, Ur Screen: NOT DETECTED
Phencyclidine (PCP) Ur S: NOT DETECTED
Tricyclic, Ur Screen: NOT DETECTED

## 2021-02-07 LAB — APTT: aPTT: 28 seconds (ref 24–36)

## 2021-02-07 LAB — CBG MONITORING, ED
Glucose-Capillary: >600 mg/dL (ref 70–99)
Glucose-Capillary: >600 mg/dL (ref 70–99)
Glucose-Capillary: 482 mg/dL — ABNORMAL HIGH (ref 70–99)
Glucose-Capillary: 368 mg/dL — ABNORMAL HIGH (ref 70–99)
Glucose-Capillary: 276 mg/dL — ABNORMAL HIGH (ref 70–99)
Glucose-Capillary: 170 mg/dL — ABNORMAL HIGH (ref 70–99)
Glucose-Capillary: 190 mg/dL — ABNORMAL HIGH (ref 70–99)
Glucose-Capillary: 173 mg/dL — ABNORMAL HIGH (ref 70–99)
Glucose-Capillary: 183 mg/dL — ABNORMAL HIGH (ref 70–99)
Glucose-Capillary: 214 mg/dL — ABNORMAL HIGH (ref 70–99)
Glucose-Capillary: 229 mg/dL — ABNORMAL HIGH (ref 70–99)

## 2021-02-07 LAB — PROTIME-INR
INR: 1 (ref 0.8–1.2)
Prothrombin Time: 13.5 seconds (ref 11.4–15.2)

## 2021-02-07 LAB — BETA-HYDROXYBUTYRIC ACID: Beta-Hydroxybutyric Acid: 4.41 mmol/L — ABNORMAL HIGH (ref 0.05–0.27)

## 2021-02-07 LAB — PHOSPHORUS: Phosphorus: 4.6 mg/dL (ref 2.5–4.6)

## 2021-02-07 LAB — LACTIC ACID, PLASMA
Lactic Acid, Venous: 2.3 mmol/L (ref 0.5–1.9)
Lactic Acid, Venous: 1.5 mmol/L (ref 0.5–1.9)
Lactic Acid, Venous: 1.3 mmol/L (ref 0.5–1.9)

## 2021-02-07 LAB — MAGNESIUM: Magnesium: 2.7 mg/dL — ABNORMAL HIGH (ref 1.7–2.4)

## 2021-02-07 LAB — OSMOLALITY: Osmolality: 321 mOsm/kg (ref 275–295)

## 2021-02-07 LAB — BRAIN NATRIURETIC PEPTIDE: B Natriuretic Peptide: 132.6 pg/mL — ABNORMAL HIGH (ref 0.0–100.0)

## 2021-02-07 MED ORDER — OMEGA-3-ACID ETHYL ESTERS 1 G PO CAPS
1.0000 g | ORAL_CAPSULE | Freq: Every day | ORAL | Status: DC
  Administered 2021-02-08: 1 g via ORAL
  Filled 2021-02-07 (×2): qty 1

## 2021-02-07 MED ORDER — INSULIN ASPART 100 UNIT/ML IJ SOLN
6.0000 [IU] | Freq: Three times a day (TID) | INTRAMUSCULAR | Status: DC
  Administered 2021-02-08 (×2): 6 [IU] via SUBCUTANEOUS
  Filled 2021-02-07 (×2): qty 1

## 2021-02-07 MED ORDER — MIRABEGRON ER 25 MG PO TB24
25.0000 mg | ORAL_TABLET | Freq: Every day | ORAL | Status: DC
  Administered 2021-02-07 – 2021-02-08 (×2): 25 mg via ORAL
  Filled 2021-02-07 (×3): qty 1

## 2021-02-07 MED ORDER — DEXTROSE IN LACTATED RINGERS 5 % IV SOLN: INTRAVENOUS | Status: DC

## 2021-02-07 MED ORDER — SODIUM CHLORIDE 0.9 % IV BOLUS
2500.0000 mL | Freq: Once | INTRAVENOUS | Status: AC
  Administered 2021-02-07: 2500 mL via INTRAVENOUS

## 2021-02-07 MED ORDER — INSULIN REGULAR(HUMAN) IN NACL 100-0.9 UT/100ML-% IV SOLN
INTRAVENOUS | Status: DC
  Administered 2021-02-07: 1.2 [IU]/h via INTRAVENOUS
  Administered 2021-02-07: 9 [IU]/h via INTRAVENOUS
  Administered 2021-02-07: 7.5 [IU]/h via INTRAVENOUS
  Administered 2021-02-07: 11.5 [IU]/h via INTRAVENOUS
  Filled 2021-02-07: qty 100

## 2021-02-07 MED ORDER — LACTATED RINGERS IV SOLN: INTRAVENOUS | Status: DC

## 2021-02-07 MED ORDER — ASCORBIC ACID 500 MG PO TABS
250.0000 mg | ORAL_TABLET | Freq: Every day | ORAL | Status: DC
  Administered 2021-02-07 – 2021-02-08 (×2): 250 mg via ORAL
  Filled 2021-02-07 (×2): qty 1

## 2021-02-07 MED ORDER — SODIUM CHLORIDE 0.9 % IV SOLN: Freq: Once | INTRAVENOUS | Status: AC

## 2021-02-07 MED ORDER — SODIUM CHLORIDE 0.9 % IV BOLUS: 500.0000 mL | Freq: Once | INTRAVENOUS | Status: DC

## 2021-02-07 MED ORDER — HYDRALAZINE HCL 20 MG/ML IJ SOLN: 5.0000 mg | INTRAMUSCULAR | Status: DC | PRN

## 2021-02-07 MED ORDER — ALPRAZOLAM 0.5 MG PO TABS: 1.0000 mg | ORAL_TABLET | Freq: Four times a day (QID) | ORAL | Status: DC | PRN

## 2021-02-07 MED ORDER — LACTATED RINGERS IV BOLUS
1000.0000 mL | Freq: Once | INTRAVENOUS | Status: AC
  Administered 2021-02-07: 1000 mL via INTRAVENOUS

## 2021-02-07 MED ORDER — CALCIUM GLUCONATE-NACL 1-0.675 GM/50ML-% IV SOLN
1.0000 g | Freq: Once | INTRAVENOUS | Status: AC
  Administered 2021-02-07: 1000 mg via INTRAVENOUS
  Filled 2021-02-07: qty 50

## 2021-02-07 MED ORDER — ALBUTEROL SULFATE (2.5 MG/3ML) 0.083% IN NEBU: 3.0000 mL | INHALATION_SOLUTION | RESPIRATORY_TRACT | Status: DC | PRN

## 2021-02-07 MED ORDER — AMLODIPINE BESYLATE 5 MG PO TABS
10.0000 mg | ORAL_TABLET | Freq: Every day | ORAL | Status: DC
  Filled 2021-02-07: qty 2

## 2021-02-07 MED ORDER — DEXTROSE-NACL 5-0.9 % IV SOLN: INTRAVENOUS | Status: DC

## 2021-02-07 MED ORDER — ASPIRIN EC 325 MG PO TBEC
325.0000 mg | DELAYED_RELEASE_TABLET | Freq: Every day | ORAL | Status: DC
  Administered 2021-02-07 – 2021-02-08 (×2): 325 mg via ORAL
  Filled 2021-02-07 (×2): qty 1

## 2021-02-07 MED ORDER — OXYCODONE HCL 5 MG PO TABS: 20.0000 mg | ORAL_TABLET | Freq: Four times a day (QID) | ORAL | Status: DC | PRN

## 2021-02-07 MED ORDER — PATIROMER SORBITEX CALCIUM 8.4 G PO PACK
25.2000 g | PACK | Freq: Every day | ORAL | Status: DC
  Administered 2021-02-07: 25.2 g via ORAL
  Filled 2021-02-07 (×3): qty 3

## 2021-02-07 MED ORDER — ZOLPIDEM TARTRATE 5 MG PO TABS: 10.0000 mg | ORAL_TABLET | Freq: Every evening | ORAL | Status: DC | PRN

## 2021-02-07 MED ORDER — SODIUM CHLORIDE 0.9 % IV BOLUS
500.0000 mL | Freq: Once | INTRAVENOUS | Status: AC
  Administered 2021-02-07: 500 mL via INTRAVENOUS

## 2021-02-07 MED ORDER — DEXTROSE 50 % IV SOLN
0.0000 mL | INTRAVENOUS | Status: DC | PRN
  Filled 2021-02-07: qty 50

## 2021-02-07 MED ORDER — SIMVASTATIN 10 MG PO TABS
40.0000 mg | ORAL_TABLET | Freq: Every day | ORAL | Status: DC
  Administered 2021-02-07 – 2021-02-08 (×2): 40 mg via ORAL
  Filled 2021-02-07 (×2): qty 4

## 2021-02-07 MED ORDER — HEPARIN SODIUM (PORCINE) 5000 UNIT/ML IJ SOLN
5000.0000 [IU] | Freq: Three times a day (TID) | INTRAMUSCULAR | Status: DC
  Administered 2021-02-07 – 2021-02-08 (×3): 5000 [IU] via SUBCUTANEOUS
  Filled 2021-02-07 (×3): qty 1

## 2021-02-07 MED ORDER — DEXTROSE 50 % IV SOLN: 0.0000 mL | INTRAVENOUS | Status: DC | PRN

## 2021-02-07 MED ORDER — INSULIN REGULAR(HUMAN) IN NACL 100-0.9 UT/100ML-% IV SOLN: INTRAVENOUS | Status: DC

## 2021-02-07 MED ORDER — VITAMIN D 25 MCG (1000 UNIT) PO TABS
10000.0000 [IU] | ORAL_TABLET | Freq: Every day | ORAL | Status: DC
  Administered 2021-02-07 – 2021-02-08 (×2): 10000 [IU] via ORAL
  Filled 2021-02-07 (×2): qty 10

## 2021-02-07 MED ORDER — ONDANSETRON HCL 4 MG/2ML IJ SOLN: 4.0000 mg | Freq: Three times a day (TID) | INTRAMUSCULAR | Status: DC | PRN

## 2021-02-07 MED ORDER — ACETAMINOPHEN 325 MG PO TABS: 650.0000 mg | ORAL_TABLET | Freq: Four times a day (QID) | ORAL | Status: DC | PRN

## 2021-02-07 MED ORDER — INSULIN GLARGINE-YFGN 100 UNIT/ML ~~LOC~~ SOLN
60.0000 [IU] | Freq: Once | SUBCUTANEOUS | Status: AC
  Administered 2021-02-07: 60 [IU] via SUBCUTANEOUS
  Filled 2021-02-07: qty 0.6

## 2021-02-07 MED ORDER — INSULIN ASPART 100 UNIT/ML IJ SOLN
0.0000 [IU] | Freq: Three times a day (TID) | INTRAMUSCULAR | Status: DC
  Administered 2021-02-08 (×2): 3 [IU] via SUBCUTANEOUS
  Administered 2021-02-08: 7 [IU] via SUBCUTANEOUS
  Filled 2021-02-07 (×3): qty 1

## 2021-02-07 MED ORDER — SODIUM CHLORIDE 0.9 % IV SOLN: INTRAVENOUS | Status: DC

## 2021-02-07 NOTE — ED Provider Notes (Signed)
[facility] Emergency Department Provider Note    Event Date/Time   First MD Initiated Contact with Patient 02/07/21 [phone]     (approximate)  I have reviewed the triage vital signs and the nursing notes.   HISTORY  Chief Complaint Weakness    HPI Corey Foster is a 65 y.o. male with a history of diabetes insulin-dependent presents to the ER for 4 days of generalized malaise weakness and fatigue.  Has had some nausea and vomiting.  No diarrhea.  Has had some cough but not productive.  Denies any numbness or tingling.  States he feels lightheaded and off-balance when he stands up quickly.  Denies any lower extremity swelling.  Denies any chest pain.  No recent antibiotics.  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 Past Surgical History:  Procedure Laterality Date   CYSTOSCOPY W/ RETROGRADES Bilateral 05/31/2019   Procedure: CYSTOSCOPY WITH RETROGRADE PYELOGRAM;  Surgeon: Abbie Sons, MD;  Location: [facility] ORS;  Service: Urology;  Laterality: Bilateral;   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-C N/A 05/31/2019   Procedure: TRANSURETHRAL RESECTION OF BLADDER TUMOR WITH gemcitabine;  Surgeon: Abbie Sons, MD;  Location: [facility] ORS;  Service: Urology;  Laterality: N/A;   Patient Active Problem List   Diagnosis Date Noted   History of bladder cancer 09/05/2019   BMI 40.0-44.9, adult ([place]) 03/02/2017   Smoking 03/02/2017   Left-sided low back pain with left-sided sciatica 11/08/2014   Lumbar radiculopathy 11/08/2014   DDD (degenerative disc disease), lumbosacral 11/08/2014   Hip pain, chronic 11/08/2014   Sacro-iliac pain 11/08/2014   Essential hypertension  11/08/2014   Diabetes mellitus due to underlying condition with diabetic neuropathy ([place]) 11/08/2014   DDD (degenerative disc disease), cervical 11/08/2014   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 Authorizing Provider  albuterol (VENTOLIN HFA) 108 (90 Base) MCG/ACT inhaler Inhale 1 puff into the lungs every 6 (six) hours as needed for wheezing or shortness of breath.  02/15/13   [provider]  ALPRAZolam (XANAX XR) 1 MG 24 hr tablet Take 1 mg by mouth daily.  01/14/12   [provider]  ALPRAZolam Duanne Moron) 1 MG tablet Take 1 mg by mouth 4 (four) times daily as needed. 08/27/19   [provider]  AMBULATORY NON FORMULARY MEDICATION Trimix (30/1/10)-(Pap/Phent/PGE)  Dosage: Inject 0.3 cc and may increase 0.1cc to achieve and erection lasting no longer than 1 hour per injection   Vial 64ml  Qty #5 refills 2  [facility] [phone] Fax [phone] 12/15/19   Abbie Sons, MD  amLODipine (NORVASC) 10 MG tablet  03/16/17   [provider]  aspirin EC 325 MG tablet Take  325 mg by mouth.     [provider]  baclofen (LIORESAL) 10 MG tablet Take 10 mg by mouth 2 (two) times daily.  10/07/13   [provider]  cholecalciferol (VITAMIN D) 400 UNITS TABS tablet Take 10,000 Units by mouth daily.     [provider]  furosemide (LASIX) 20 MG tablet Take 20 mg by mouth daily. 01/15/21   [provider]   insulin glargine (LANTUS) 100 UNIT/ML injection Inject 80 Units into the skin daily. 80 units in the am and 120 units at bedtime 01/14/12   [provider]  insulin lispro (HUMALOG) 100 UNIT/ML KiwkPen 40 Units.  10/15/17   [provider]  Insulin Pen Needle 31G X 5 MM MISC  04/28/12   [provider]  JARDIANCE 25 MG TABS tablet Take 25 mg by mouth daily. 01/14/21   [provider]  lidocaine (XYLOCAINE) 2 % solution Use as directed 10 mLs in the mouth or throat every 4 (four) hours as needed for mouth pain. Swish and spit 08/12/20   Cuthriell, Roderic Palau D, PA-C  lidocaine (XYLOCAINE) 2 % solution Use as directed 5 mLs in the mouth or throat every 6 (six) hours as needed for mouth pain. 10/26/20   Sable Feil, PA-C  losartan (COZAAR) 100 MG tablet Take 100 mg by mouth daily.  09/25/17   [provider]  metFORMIN (GLUCOPHAGE) 1000 MG tablet Take 1,000 mg by mouth 2 (two) times daily. 04/29/19   [provider]  mirabegron ER (MYRBETRIQ) 25 MG TB24 tablet Take 1 tablet (25 mg total) by mouth daily. 07/12/20   Stoioff, Ronda Fairly, MD  Nutritional Supplements (KETO PO) Take 1 capsule by mouth.    [provider]  Omega-3 Fatty Acids (FISH OIL) 1000 MG CAPS Take 1 capsule by mouth daily.  01/14/12   [provider]  ONE TOUCH ULTRA TEST test strip  10/12/17   [provider]  Oxycodone HCl 20 MG TABS Take 20 mg by mouth 4 (four) times daily as needed (pain).  06/20/15   [provider]  sildenafil (REVATIO) 20 MG tablet TAKE 2 TO 5 TABLETS BY MOUTH AS NEEDED FOR ERECTILE DYSFUNCTION 01/21/21   Stoioff, Ronda Fairly, MD  simvastatin (ZOCOR) 40 MG tablet Take 1 tablet by mouth daily. 07/07/16   [provider]  Testosterone 20.25 MG/ACT (1.62%) GEL APPLY 4 APPLICATIONS TOPICALLY DAILY. 10/11/20   Abbie Sons, MD  TRULICITY 9.50 DT/2.6ZT SOPN  11/05/17   [provider]  vitamin C (ASCORBIC ACID) 250 MG tablet  Take 250 mg by mouth daily.    [provider]  zolpidem (AMBIEN) 10 MG tablet Take 10 mg by mouth at bedtime as needed. 04/18/19   [provider]    Allergies Patient has no known allergies.    Social History Social History   Tobacco Use   Smoking status: Every Day    Packs/day: 0.25    Years: 40.00    Pack years: 10.00    Types: Cigarettes   Smokeless tobacco: Never  Vaping Use   Vaping Use: Never used  Substance Use Topics   Alcohol use: Yes    Alcohol/week: 3.0 standard drinks    Types: 3 Cans of beer per week   Drug use: Yes    Types: Cocaine, Marijuana    Review of Systems Patient denies headaches, rhinorrhea, blurry vision, numbness, shortness of breath, chest pain, edema, cough, abdominal pain, nausea, vomiting, diarrhea, dysuria,  fevers, rashes or hallucinations unless otherwise stated above in HPI. ____________________________________________   PHYSICAL EXAM:  VITAL SIGNS: Vitals:   02/07/21 0900 02/07/21 0930  BP: 94/70 108/87  Pulse: (!) 109 (!) 119  Resp: 16 (!) 22  Temp:    SpO2: 95% 94%    Constitutional: Alert and oriented.  Eyes: Conjunctivae are normal.  Head: Atraumatic. Nose: No congestion/rhinnorhea. Mouth/Throat: Mucous membranes are dry   Neck: No stridor. Painless ROM.  Cardiovascular: Normal rate, regular rhythm. Grossly normal heart sounds.  Good peripheral circulation. Respiratory: Normal respiratory effort.  No retractions. Lungs CTAB. Gastrointestinal: Soft and nontender. No distention. No abdominal bruits. No CVA tenderness. Genitourinary:  Musculoskeletal: No lower extremity tenderness nor edema.  No joint effusions. Neurologic:  Normal speech and language. No gross focal neurologic deficits are appreciated. No facial droop Skin:  Skin is warm, dry and intact. No rash noted. Psychiatric: Mood and affect are normal. Speech and behavior are normal.  ____________________________________________   LABS (all  labs ordered are listed, but only abnormal results are displayed)  Results for orders placed or performed during the hospital encounter of 02/07/21 (from the past 24 hour(s))  Lactic acid, plasma     Status: Abnormal   Collection Time: 02/07/21  9:08 AM  Result Value Ref Range   Lactic Acid, Venous 2.3 (HH) 0.5 - 1.9 mmol/L  Comprehensive metabolic panel     Status: Abnormal   Collection Time: 02/07/21  9:08 AM  Result Value Ref Range   Sodium 121 (L) 135 - 145 mmol/L   Potassium 6.6 (HH) 3.5 - 5.1 mmol/L   Chloride 80 (L) 98 - 111 mmol/L   CO2 20 (L) 22 - 32 mmol/L   Glucose, Bld 655 (HH) 70 - 99 mg/dL   BUN 66 (H) 8 - 23 mg/dL   Creatinine, Ser 3.60 (H) 0.61 - 1.24 mg/dL   Calcium 8.8 (L) 8.9 - 10.3 mg/dL   Total Protein 7.9 6.5 - 8.1 g/dL   Albumin 3.7 3.5 - 5.0 g/dL   AST 12 (L) 15 - 41 U/L   ALT 13 0 - 44 U/L   Alkaline Phosphatase 80 38 - 126 U/L   Total Bilirubin 1.6 (H) 0.3 - 1.2 mg/dL   GFR, Estimated 18 (L) >60 mL/min   Anion gap 21 (H) 5 - 15  CBC WITH DIFFERENTIAL     Status: Abnormal   Collection Time: 02/07/21  9:08 AM  Result Value Ref Range   WBC 8.8 4.0 - 10.5 K/uL   RBC 5.74 4.22 - 5.81 MIL/uL   Hemoglobin 18.9 (H) 13.0 - 17.0 g/dL   HCT 52.7 (H) 39.0 - 52.0 %   MCV 91.8 80.0 - 100.0 fL   MCH 32.9 26.0 - 34.0 pg   MCHC 35.9 30.0 - 36.0 g/dL   RDW 12.4 11.5 - 15.5 %   Platelets 269 150 - 400 K/uL   nRBC 0.0 0.0 - 0.2 %   Neutrophils Relative % 77 %   Neutro Abs 6.8 1.7 - 7.7 K/uL   Lymphocytes Relative 13 %   Lymphs Abs 1.1 0.7 - 4.0 K/uL   Monocytes Relative 9 %   Monocytes Absolute 0.8 0.1 - 1.0 K/uL   Eosinophils Relative 0 %   Eosinophils Absolute 0.0 0.0 - 0.5 K/uL   Basophils Relative 0 %   Basophils Absolute 0.0 0.0 - 0.1 K/uL   Immature Granulocytes 1 %   Abs Immature Granulocytes 0.05 0.00 - 0.07 K/uL  Resp Panel  by RT-PCR (Flu A&B, Covid) Nasopharyngeal Swab     Status: None   Collection Time: 02/07/21  9:08 AM   Specimen: Nasopharyngeal  Swab; Nasopharyngeal(NP) swabs in vial transport medium  Result Value Ref Range   SARS Coronavirus 2 by RT PCR NEGATIVE NEGATIVE   Influenza A by PCR NEGATIVE NEGATIVE   Influenza B by PCR NEGATIVE NEGATIVE  Protime-INR     Status: None   Collection Time: 02/07/21  9:57 AM  Result Value Ref Range   Prothrombin Time 13.5 11.4 - 15.2 seconds   INR 1.0 0.8 - 1.2  APTT     Status: None   Collection Time: 02/07/21  9:57 AM  Result Value Ref Range   aPTT 28 24 - 36 seconds   ____________________________________________  EKG My review and personal interpretation at Time: 8:23   Indication: weakness  Rate: 110  Rhythm: sinus Axis: normal Other: poor r wave progression, no stemi, nonspecific st abn ____________________________________________  RADIOLOGY  I personally reviewed all radiographic images ordered to evaluate for the above acute complaints and reviewed radiology reports and findings.  These findings were personally discussed with the patient.  Please see medical record for radiology report.  ____________________________________________   PROCEDURES  Procedure(s) performed:  .Critical Care Performed by: Merlyn Lot, MD Authorized by: Merlyn Lot, MD   Critical care provider statement:    Critical care time (minutes):  35   Critical care was necessary to treat or prevent imminent or life-threatening deterioration of the following conditions:  Endocrine crisis   Critical care was time spent personally by me on the following activities:  Ordering and performing treatments and interventions, ordering and review of laboratory studies, ordering and review of radiographic studies, pulse oximetry, re-evaluation of patient's condition, review of old charts, obtaining history from patient or surrogate, examination of patient, evaluation of patient's response to treatment, discussions with primary provider, discussions with consultants and development of treatment plan with  patient or surrogate   I assumed direction of critical care for this patient from another provider in my specialty: no      Critical Care performed: yes ____________________________________________   INITIAL IMPRESSION / [place] / ED COURSE  Pertinent labs & imaging results that were available during my care of the patient were reviewed by me and considered in my medical decision making (see chart for details).   DDX: Dehydration, electrolyte abnormality, sepsis, viral illness, UTI, retention  Corey Foster is a 65 y.o. who presents to the ED with presentation as described above.  Patient frail and dehydrated appearing will give IV fluids.  Blood work will be checked for above differential.  He is denying any pain.  Has a benign abdominal exam.  Clinical Course as of 02/07/21 1044  Thu Feb 07, 2021  1042 Patient's lactate is mildly elevated patient has evidence of significant AKI with hyperglycemia mild metabolic acidosis and elevated anion gap.  Does have hyponatremia as well as hyperkalemia.  Will be given IV insulin for hyperglycemia does not meet criteria for DKA will add on osmolality.  Will order ultrasound renal to evaluate for any obstructive uropathy.  Suspect this is all dehydration.  We will continue with additional IV hydration.  Will discuss with hospitalist for admission. [PR]    Clinical Course User Index [PR] Merlyn Lot, MD    The patient was evaluated in Emergency Department today for the symptoms described in the history of present illness. He/she was evaluated in the context of the  global COVID-19 pandemic, which necessitated consideration that the patient might be at risk for infection with the SARS-CoV-2 virus that causes COVID-19. Institutional protocols and algorithms that pertain to the evaluation of patients at risk for COVID-19 are in a state of rapid change based on information released by regulatory bodies including the CDC and federal and state  organizations. These policies and algorithms were followed during the patient's care in the ED.  As part of my medical decision making, I reviewed the following data within the [place] notes reviewed and incorporated, Labs reviewed, notes from prior ED visits and [place] Controlled Substance Database   ____________________________________________   FINAL CLINICAL IMPRESSION(S) / ED DIAGNOSES  Final diagnoses:  AKI (acute kidney injury) ([place])  Hyperglycemia  Hyperkalemia      NEW MEDICATIONS STARTED DURING THIS VISIT:  New Prescriptions   No medications on file     Note:  This document was prepared using Dragon voice recognition software and may include unintentional dictation errors.    Merlyn Lot, MD 02/07/21 1044

## 2021-02-07 NOTE — ED Notes (Signed)
Fsbs [id number]

## 2021-02-07 NOTE — ED Triage Notes (Signed)
Patient c/o decreased appetite, N/V, and weakness X 4 days. Patient reports 2 falls, once Monday night, once Wednesday morning. Patient c/o upper and lower back pain post falls; also reports hx of DDD.

## 2021-02-07 NOTE — ED Notes (Signed)
Endotool confirmed to keep IN insulin gtt at 11.76mL/hr. Continued.

## 2021-02-07 NOTE — ED Notes (Signed)
Pt given warm blankets. Stretcher locked low. Rail up. Call bell within reach.

## 2021-02-07 NOTE — ED Notes (Addendum)
Pt offered pain meds for chronic back pain. Pt declined offer.

## 2021-02-07 NOTE — ED Notes (Signed)
Phlebotomy coming soon to collect necessary blood work per lab staff.

## 2021-02-07 NOTE — Progress Notes (Signed)
Inpatient Diabetes Program Recommendations  AACE/ADA: New Consensus Statement on Inpatient Glycemic Control (2015)  Target Ranges:  Prepandial:   less than 140 mg/dL      Peak postprandial:   less than 180 mg/dL (1-2 hours)      Critically ill patients:  140 - 180 mg/dL   Lab Results  Component Value Date   GLUCAP 210 (H) 05/31/2019    Review of Glycemic Control  Latest Reference Range & Units 02/07/21 09:08  Glucose 70 - 99 mg/dL 655 (HH)    Latest Reference Range & Units 02/07/21 09:08  Osmolality 275 - 295 mOsm/kg 321 (HH)   Diabetes history: DM 2 Outpatient Diabetes medications:  Lantus 80 units q AM and 120 units q PM, Humalog 40 units tid with meals, Trulicity 4.76 weekly, Jardiance 25 mg daily, Metformin 1000 mg bid Current orders for Inpatient glycemic control:   IV insulin ordered  Inpatient Diabetes Program Recommendations:    Referral received.  It appears patient takes significant insulin doses at home.  Note osmolality increased however no acidosis noted (CO2=20).    May consider Hyperglycemia crises order set with "[facility]".  Will follow.   Thanks,  Adah Perl, RN, BC-ADM Inpatient Diabetes Coordinator Pager [phone]  (8a-5p)

## 2021-02-07 NOTE — ED Notes (Signed)
Provider Blaine Hamper notified via secure chat of inc osmolality and Beta-hydroxybutyricacid. Awaiting orders.

## 2021-02-07 NOTE — ED Notes (Signed)
Fsbs 170

## 2021-02-07 NOTE — H&P (Addendum)
History and Physical    Corey Foster NLG:[id number] DOB: [birth year] DOA: 02/07/2021  Referring MD/NP/PA:   PCP: Donnie Coffin, MD   Patient coming from:  The patient is coming from home.  At baseline, pt is independent for most of ADL.        Chief Complaint: Generalized weakness.  HPI: Corey Foster is a 65 y.o. male with medical history significant of diabetes mellitus, hypertension, hyperlipidemia, GERD, anxiety, OSA on CPAP, secondary erythrocytosis, BPH, tobacco abuse, CKD-3A, questionable bladder cancer (patient denies this diagnosis), who presents with generalized weakness.  Patient states that he has been feeling weak for more than 4 days, which has been progressively worsening.  Patient has poor appetite, decreased oral intake.  He has nausea and vomited once today.  Denies abdominal pain.  No diarrhea. Patient does not have chest pain, cough, shortness breath.  No fever or chills.  Patient states that he has difficulty urinating, but no dysuria or burning on urination.  He states that he has lightheadedness and fell twice on Monday night and Wednesday morning.  He strongly denies any head or neck injury.  He refused CT scan of head and neck.  No unilateral numbness or tinglings in extremities.  No facial droop or slurred speech.  ED Course: pt was found to have DKA (blood sugar 655, bicarbonate 20, anion gap 21, positive ketones in urinalysis at 15), WBC 8.8, lactic acid 2.3, INR 1.0, PTT 28, negative COVID PCR, potassium 6.6, pseudohyponatremia, worsening renal function (baseline creatinine 1.73 on 08/12/2020, his creatinine is 3.60, BUN 66 today), temperature normal, blood pressure well 80/87, heart rate 119, RR 22, oxygen saturation 94% on room air, chest x-ray showed new right hemidiaphragm elevation, without infiltration. US-renal showed no hydronephrosis. Patient is admitted to stepdown as inpatient  Review of Systems:   General: no fevers, chills, no body weight gain, has  poor appetite, has fatigue HEENT: no blurry vision, hearing changes or sore throat Respiratory: no dyspnea, coughing, wheezing CV: no chest pain, no palpitations GI: has nausea, vomiting, no abdominal pain, diarrhea, constipation GU: no dysuria, burning on urination, increased urinary frequency, hematuria  Ext: no leg edema Neuro: no unilateral weakness, numbness, or tingling, no vision change or hearing loss. Has fall Skin: no rash, no skin tear. MSK: No muscle spasm, no deformity, no limitation of range of movement in spin Heme: No easy bruising.  Travel history: No recent long distant travel.  Allergy:  Allergies  Allergen Reactions   Nsaids Other (See Comments)    AKI AKI     Past Medical History:  Diagnosis Date   Arthritis    Diabetes mellitus without complication (HCC)    DJD (degenerative joint disease)    GERD (gastroesophageal reflux disease)    Hyperlipidemia    Hypertension    Sciatic nerve pain    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   Sleep apnea     Past Surgical History:  Procedure Laterality Date   CYSTOSCOPY W/ RETROGRADES Bilateral 05/31/2019   Procedure: CYSTOSCOPY WITH RETROGRADE PYELOGRAM;  Surgeon: Abbie Sons, MD;  Location: [facility] ORS;  Service: Urology;  Laterality: Bilateral;   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-C N/A 05/31/2019   Procedure: TRANSURETHRAL RESECTION OF BLADDER TUMOR WITH gemcitabine;  Surgeon: Abbie Sons, MD;  Location: [facility] OR[facility];  Service: Urology;  Laterality: N/A;    Social History:  reports that he has been smoking cigarettes. He has a 10.00 pack-year smoking history. He has never used  smokeless tobacco. He reports current alcohol use of about 3.0 standard drinks per week. He reports current drug use. Drugs: Cocaine and Marijuana.  Family History:  Family History  Problem Relation Age of Onset   Diabetes Mother    Heart disease Mother    Diabetes Father    Heart disease Father    Diabetes Brother       Prior to Admission medications   Medication Sig Start Date End Date Taking? Authorizing Provider  albuterol (VENTOLIN HFA) 108 (90 Base) MCG/ACT inhaler Inhale 1 puff into the lungs every 6 (six) hours as needed for wheezing or shortness of breath.  02/15/13   [provider]  ALPRAZolam (XANAX XR) 1 MG 24 hr tablet Take 1 mg by mouth daily.  01/14/12   [provider]  ALPRAZolam Duanne Moron) 1 MG tablet Take 1 mg by mouth 4 (four) times daily as needed. 08/27/19   [provider]  AMBULATORY NON FORMULARY MEDICATION Trimix (30/1/10)-(Pap/Phent/PGE)  Dosage: Inject 0.3 cc and may increase 0.1cc to achieve and erection lasting no longer than 1 hour per injection   Vial 57ml  Qty #5 refills 2  [facility] [phone] Fax [phone] 12/15/19   Abbie Sons, MD  amLODipine (NORVASC) 10 MG tablet  03/16/17   [provider]  aspirin EC 325 MG tablet Take 325 mg by mouth.     [provider]  baclofen (LIORESAL) 10 MG tablet Take 10 mg by mouth 2 (two) times daily.  10/07/13   [provider]  cholecalciferol (VITAMIN D) 400 UNITS TABS tablet Take 10,000 Units by mouth daily.     [provider]  furosemide (LASIX) 20 MG tablet Take 20 mg by mouth daily. 01/15/21   [provider]  insulin glargine (LANTUS) 100 UNIT/ML injection Inject 80 Units into the skin daily. 80 units in the am and 120 units at bedtime 01/14/12   [provider]  insulin lispro (HUMALOG) 100 UNIT/ML KiwkPen 40 Units.  10/15/17   [provider]  Insulin Pen Needle 31G X 5 MM MISC  04/28/12   [provider]  JARDIANCE 25 MG TABS tablet Take 25 mg by mouth daily. 01/14/21   [provider]  lidocaine (XYLOCAINE) 2 % solution Use as directed 10 mLs in the mouth or throat every 4 (four) hours as needed for mouth pain. Swish and spit 08/12/20   Cuthriell, Roderic Palau D, PA-C  lidocaine (XYLOCAINE) 2 % solution Use  as directed 5 mLs in the mouth or throat every 6 (six) hours as needed for mouth pain. 10/26/20   Sable Feil, PA-C  losartan (COZAAR) 100 MG tablet Take 100 mg by mouth daily.  09/25/17   [provider]  metFORMIN (GLUCOPHAGE) 1000 MG tablet Take 1,000 mg by mouth 2 (two) times daily. 04/29/19   [provider]  mirabegron ER (MYRBETRIQ) 25 MG TB24 tablet Take 1 tablet (25 mg total) by mouth daily. 07/12/20   Stoioff, Ronda Fairly, MD  Nutritional Supplements (KETO PO) Take 1 capsule by mouth.    [provider]  Omega-3 Fatty Acids (FISH OIL) 1000 MG CAPS Take 1 capsule by mouth daily.  01/14/12   [provider]  ONE TOUCH ULTRA TEST test strip  10/12/17   [provider]  Oxycodone HCl 20 MG TABS Take 20 mg by mouth 4 (four) times daily as needed (pain).  06/20/15   [provider]  sildenafil (REVATIO) 20 MG tablet TAKE  2 TO 5 TABLETS BY MOUTH AS NEEDED FOR ERECTILE DYSFUNCTION 01/21/21   Stoioff, Ronda Fairly, MD  simvastatin (ZOCOR) 40 MG tablet Take 1 tablet by mouth daily. 07/07/16   [provider]  Testosterone 20.25 MG/ACT (1.62%) GEL APPLY 4 APPLICATIONS TOPICALLY DAILY. 10/11/20   Abbie Sons, MD  TRULICITY 6.59 DJ/5.7SV SOPN  11/05/17   [provider]  vitamin C (ASCORBIC ACID) 250 MG tablet Take 250 mg by mouth daily.    [provider]  zolpidem (AMBIEN) 10 MG tablet Take 10 mg by mouth at bedtime as needed. 04/18/19   [provider]    Physical Exam: Vitals:   02/07/21 0900 02/07/21 0930 02/07/21 1000 02/07/21 1045  BP: 94/70 108/87 114/71   Pulse: (!) 109 (!) 119 (!) 106 (!) 106  Resp: 16 (!) 22 20 18   Temp:      TempSrc:      SpO2: 95% 94% 95% 95%  Weight:      Height:       General: Not in acute distress.  Dry mucous membrane HEENT:       Eyes: PERRL, EOMI, no scleral icterus.       ENT: No discharge from the ears and nose, no pharynx injection, no tonsillar enlargement.        Neck: No  JVD, no bruit, no mass felt. Heme: No neck lymph node enlargement. Cardiac: S1/S2, RRR, No murmurs, No gallops or rubs. Respiratory: No rales, wheezing, rhonchi or rubs. GI: Soft, nondistended, nontender, no rebound pain, no organomegaly, BS present. GU: No hematuria Ext: No pitting leg edema bilaterally. 1+DP/PT pulse bilaterally. Musculoskeletal: No joint deformities, No joint redness or warmth, no limitation of ROM in spin. Skin: No rashes.  Neuro: Alert, oriented X3, cranial nerves II-XII grossly intact, moves all extremities normally.  Psych: Patient is not psychotic, no suicidal or hemocidal ideation.  Labs on Admission: I have personally reviewed following labs and imaging studies  CBC: Recent Labs  Lab 02/07/21 0908  WBC 8.8  NEUTROABS 6.8  HGB 18.9*  HCT 52.7*  MCV 91.8  PLT 779   Basic Metabolic Panel: Recent Labs  Lab 02/07/21 0908  NA 121*  K 6.6*  CL 80*  CO2 20*  GLUCOSE 655*  BUN 66*  CREATININE 3.60*  CALCIUM 8.8*   GFR: Estimated Creatinine Clearance: 26.2 mL/min (A) (by C-G formula based on SCr of 3.6 mg/dL (H)). Liver Function Tests: Recent Labs  Lab 02/07/21 0908  AST 12*  ALT 13  ALKPHOS 80  BILITOT 1.6*  PROT 7.9  ALBUMIN 3.7   No results for input(s): LIPASE, AMYLASE in the last 168 hours. No results for input(s): AMMONIA in the last 168 hours. Coagulation Profile: Recent Labs  Lab 02/07/21 0957  INR 1.0   Cardiac Enzymes: No results for input(s): CKTOTAL, CKMB, CKMBINDEX, TROPONINI in the last 168 hours. BNP (last 3 results) No results for input(s): PROBNP in the last 8760 hours. HbA1C: No results for input(s): HGBA1C in the last 72 hours. CBG: No results for input(s): GLUCAP in the last 168 hours. Lipid Profile: No results for input(s): CHOL, HDL, LDLCALC, TRIG, CHOLHDL, LDLDIRECT in the last 72 hours. Thyroid Function Tests: No results for input(s): TSH, T4TOTAL, FREET4, T3FREE, THYROIDAB in the last 72 hours. Anemia  Panel: No results for input(s): VITAMINB12, FOLATE, FERRITIN, TIBC, IRON, RETICCTPCT in the last 72 hours. Urine analysis:    Component Value Date/Time   COLORURINE YELLOW 02/07/2021 0908   APPEARANCEUR  CLEAR (A) 02/07/2021 0908   APPEARANCEUR Clear 08/16/2020 1417   LABSPEC 1.015 02/07/2021 0908   PHURINE 5.5 02/07/2021 0908   GLUCOSEU >1,000 (A) 02/07/2021 0908   HGBUR NEGATIVE 02/07/2021 0908   BILIRUBINUR SMALL (A) 02/07/2021 0908   BILIRUBINUR Negative 08/16/2020 1417   KETONESUR 15 (A) 02/07/2021 0908   PROTEINUR NEGATIVE 02/07/2021 0908   NITRITE NEGATIVE 02/07/2021 0908   LEUKOCYTESUR NEGATIVE 02/07/2021 0908   Sepsis Labs: @LABRCNTIP (procalcitonin:4,lacticidven:4) ) Recent Results (from the past 240 hour(s))  Resp Panel by RT-PCR (Flu A&B, Covid) Nasopharyngeal Swab     Status: None   Collection Time: 02/07/21  9:08 AM   Specimen: Nasopharyngeal Swab; Nasopharyngeal(NP) swabs in vial transport medium  Result Value Ref Range Status   SARS Coronavirus 2 by RT PCR NEGATIVE NEGATIVE Final    Comment: (NOTE) SARS-CoV-2 target nucleic acids are NOT DETECTED.  The SARS-CoV-2 RNA is generally detectable in upper respiratory specimens during the acute phase of infection. The lowest concentration of SARS-CoV-2 viral copies this assay can detect is 138 copies/mL. A negative result does not preclude SARS-Cov-2 infection and should not be used as the sole basis for treatment or other patient management decisions. A negative result may occur with  improper specimen collection/handling, submission of specimen other than nasopharyngeal swab, presence of viral mutation(s) within the areas targeted by this assay, and inadequate number of viral copies(<138 copies/mL). A negative result must be combined with clinical observations, patient history, and epidemiological information. The expected result is Negative.  Fact Sheet for Patients:   EntrepreneurPulse.com.au  Fact Sheet for Healthcare Providers:  IncredibleEmployment.be  This test is no t yet approved or cleared by the [place] FDA and  has been authorized for detection and/or diagnosis of SARS-CoV-2 by FDA under an Emergency Use Authorization (EUA). This EUA will remain  in effect (meaning this test can be used) for the duration of the COVID-19 declaration under Section 564(b)(1) of the Act, 21 U.S.C.section 360bbb-3(b)(1), unless the authorization is terminated  or revoked sooner.       Influenza A by PCR NEGATIVE NEGATIVE Final   Influenza B by PCR NEGATIVE NEGATIVE Final    Comment: (NOTE) The Xpert Xpress SARS-CoV-2/FLU/RSV plus assay is intended as an aid in the diagnosis of influenza from Nasopharyngeal swab specimens and should not be used as a sole basis for treatment. Nasal washings and aspirates are unacceptable for Xpert Xpress SARS-CoV-2/FLU/RSV testing.  Fact Sheet for Patients: EntrepreneurPulse.com.au  Fact Sheet for Healthcare Providers: IncredibleEmployment.be  This test is not yet approved or cleared by the [place] FDA and has been authorized for detection and/or diagnosis of SARS-CoV-2 by FDA under an Emergency Use Authorization (EUA). This EUA will remain in effect (meaning this test can be used) for the duration of the COVID-19 declaration under Section 564(b)(1) of the Act, 21 U.S.C. section 360bbb-3(b)(1), unless the authorization is terminated or revoked.  Performed at [facility], [address], [place], [place] [address]      Radiological Exams on Admission: US Renal  Result Date: 02/07/2021 CLINICAL DATA:  Acute kidney injury. EXAM: RENAL / URINARY TRACT ULTRASOUND COMPLETE COMPARISON:  MRI 07/13/2019 FINDINGS: Right Kidney: Renal measurements: 11 x 5.3 x 4.9 cm = volume: 150 mL. Echogenicity within normal limits. No mass or  hydronephrosis visualized. Left Kidney: Renal measurements: 11.3 x 5.5 x 4.9 cm = volume: 159 mL. Echogenicity within normal limits. No mass or hydronephrosis visualized. Bladder: Appears normal for degree of bladder distention. Other: None.  IMPRESSION: No hydronephrosis. Electronically Signed   By: Dahlia Bailiff M.D.   On: 02/07/2021 11:25   DG Chest Port 1 View  Result Date: 02/07/2021 CLINICAL DATA:  Questionable sepsis. EXAM: PORTABLE CHEST 1 VIEW COMPARISON:  05/12/2005 FINDINGS: The cardiomediastinal silhouette is unchanged with normal heart size. There is new elevation of the right hemidiaphragm with mild right basilar lung opacity likely reflecting atelectasis. The left lung is clear. No sizable pleural effusion or pneumothorax is identified. No acute osseous abnormality is seen. IMPRESSION: New elevation of the right hemidiaphragm with mild right basilar atelectasis. Electronically Signed   By: Logan Bores M.D.   On: 02/07/2021 09:08     EKG: I have personally reviewed.  Sinus rhythm, QTC 437, low voltage, LAE, poor R wave progression   Assessment/Plan Principal Problem:   DKA (diabetic ketoacidosis) ([place]) Active Problems:   Essential hypertension   Benign prostatic hyperplasia with urinary obstruction   Smoking   Acute renal failure superimposed on stage 3a chronic kidney disease (HCC)   HLD (hyperlipidemia)   Anxiety   Fall at home, initial encounter   Hyperkalemia   Elevated lactic acid level   Type II diabetes mellitus with renal manifestations (HCC)  DKA (diabetic ketoacidosis) ([place]): pt has blood sugar 655, bicarbonate 20, anion gap 21, positive ketones in urinalysis at 15. Pending beta hydroxybutyric acid level.  - Admit to stepdown as inpt - IVF:  total of 3L of LR bolus - start DKA protocol with BMP q4h - IVF: LR at 125 cc/h, will switch to D5-LR at 125 cc/h when CBG<250 - Zofran prn nausea  - NPO  - consult to diabetic educator  Type II diabetes mellitus with  renal manifestations: No A1c on record.  Obviously poorly controlled.  Patient is taking Trulicity, metformin, Jardiance, Humalog and Lantus 80 unit in morning and 120 unit in evening at home -DKA protocol currently  Essential hypertension -IV hydralazine as needed -Hold Lasix and Cozaar due to worsening renal function -also hold amlodipine due to soft Bp  Benign prostatic hyperplasia with urinary obstruction: Patient used to be on mirabegron -Restart mirabegron due to difficulties urinating  Smoking -Nicotine patch  Acute renal failure superimposed on stage 3a chronic kidney disease ([place]): Likely due to dehydration and continuation of diuretics and Cozaar.  Renal ultrasound is negative for hydronephrosis.  Urinalysis negative for UTI -Hold Lasix and Cozaar -IV fluid as above  HLD (hyperlipidemia) -Zocor  Anxiety -Continue home as needed Xanax  Fall at home, initial encounter: Patient is strongly denies any head or neck injury.  Refused CT of head and neck. -PT/OT  Hyperkalemia: Potassium 6.6.  No T wave peaking on EKG.  Likely due to combination of DKA and worsening renal function. -Patiromer 25.2 gram was ordered in ED -give 1 gram of calcium gluconate -IV fluid as above -Patient is on insulin drip, expecting correction  Elevated lactic acid level: Lactic acid 2.3, likely due to dehydration.  No source of infection identified. -Trend lactic acid level -IV fluid as above     DVT ppx: SQ Heparin    Code Status: Full code Family Communication: not done, no family member is at bed side.  Disposition Plan:  Anticipate discharge back to previous environment Consults called:  none Admission status and Level of care: Stepdown:   SDU/inpation         Status is: Inpatient  Remains inpatient appropriate because: Patient has multiple comorbidities, now presents with DKA, fall, hyperkalemia, worsening renal function.  His presentation is highly complicated.  Patient is at high  risk of deteriorating.  Need to be treated in hospital for at least 2 days.           Date of Service 02/07/2021    [facility]   If 7PM-7AM, please contact night-coverage [email or web address] 02/07/2021, 12:26 PM

## 2021-02-07 NOTE — ED Notes (Signed)
Provider Blaine Hamper notified via secure chat of pt's BP, MAP, HR, BG. Fluid bolus of 2500L NS almost finished. LR bolus just started since cal glu finished running recently as secondary off LR bag.

## 2021-02-07 NOTE — ED Notes (Signed)
ED charge RN aware no channels for IV pumps currently available. Katie Designer, multimedia currently attempting to obtain access to more channels. Will start insulin gtt and LR once channels available. Pt reports mild chronic back pain. Pt denies need for pain med currently. Given warm blanket.

## 2021-02-07 NOTE — ED Notes (Signed)
Critical results: Given to MD Quentin Cornwall Glucose 655  Potassium 6.6

## 2021-02-07 NOTE — ED Notes (Signed)
IV team at bedside. Attending at bedside.

## 2021-02-08 DIAGNOSIS — I10 Essential (primary) hypertension: Secondary | ICD-10-CM | POA: Diagnosis not present

## 2021-02-08 DIAGNOSIS — N179 Acute kidney failure, unspecified: Secondary | ICD-10-CM | POA: Diagnosis not present

## 2021-02-08 DIAGNOSIS — E1110 Type 2 diabetes mellitus with ketoacidosis without coma: Secondary | ICD-10-CM | POA: Diagnosis not present

## 2021-02-08 DIAGNOSIS — E1010 Type 1 diabetes mellitus with ketoacidosis without coma: Secondary | ICD-10-CM | POA: Diagnosis not present

## 2021-02-08 DIAGNOSIS — E785 Hyperlipidemia, unspecified: Secondary | ICD-10-CM | POA: Diagnosis not present

## 2021-02-08 LAB — BASIC METABOLIC PANEL
Anion gap: 7 (ref 5–15)
BUN: 44 mg/dL — ABNORMAL HIGH (ref 8–23)
CO2: 26 mmol/L (ref 22–32)
Calcium: 8.3 mg/dL — ABNORMAL LOW (ref 8.9–10.3)
Chloride: 102 mmol/L (ref 98–111)
Creatinine, Ser: 1.94 mg/dL — ABNORMAL HIGH (ref 0.61–1.24)
GFR, Estimated: 38 mL/min — ABNORMAL LOW (ref >60)
Glucose, Bld: 126 mg/dL — ABNORMAL HIGH (ref 70–99)
Potassium: 4.1 mmol/L (ref 3.5–5.1)
Sodium: 135 mmol/L (ref 135–145)

## 2021-02-08 LAB — HIV ANTIBODY (ROUTINE TESTING W REFLEX): HIV Screen 4th Generation wRfx: NONREACTIVE

## 2021-02-08 LAB — CBG MONITORING, ED
Glucose-Capillary: 135 mg/dL — ABNORMAL HIGH (ref 70–99)
Glucose-Capillary: 143 mg/dL — ABNORMAL HIGH (ref 70–99)
Glucose-Capillary: 238 mg/dL — ABNORMAL HIGH (ref 70–99)

## 2021-02-08 MED ORDER — INSULIN GLARGINE-YFGN 100 UNIT/ML ~~LOC~~ SOLN
60.0000 [IU] | Freq: Two times a day (BID) | SUBCUTANEOUS | Status: DC
  Filled 2021-02-08 (×3): qty 0.6

## 2021-02-08 MED ORDER — BACLOFEN 10 MG PO TABS
10.0000 mg | ORAL_TABLET | Freq: Two times a day (BID) | ORAL | Status: DC | PRN
  Filled 2021-02-08: qty 1

## 2021-02-08 MED ORDER — EMPAGLIFLOZIN 25 MG PO TABS
25.0000 mg | ORAL_TABLET | Freq: Every day | ORAL | Status: DC
  Filled 2021-02-08: qty 1

## 2021-02-08 MED ORDER — SODIUM CHLORIDE 0.9 % IV SOLN: Freq: Once | INTRAVENOUS | Status: AC

## 2021-02-08 MED ORDER — METOPROLOL TARTRATE 25 MG PO TABS
25.0000 mg | ORAL_TABLET | Freq: Two times a day (BID) | ORAL | Status: DC
  Administered 2021-02-08: 25 mg via ORAL
  Filled 2021-02-08: qty 1

## 2021-02-08 NOTE — Care Management CC44 (Signed)
Condition Code 44 Documentation Completed  Patient Details  Name: Corey Foster MRN: [id number] Date of Birth: 01/23/1956   Condition Code 44 given:  Yes Patient signature on Condition Code 44 notice:  Yes Documentation of 2 MD's agreement:  Yes Code 44 added to claim:  Yes    Anselm Pancoast, RN 02/08/2021, 2:00 PM

## 2021-02-08 NOTE — Discharge Summary (Signed)
[facility] NAME: Corey Foster    MR#:  [id number]  [place] OF BIRTH:  06-Sep-1955  DATE OF ADMISSION:  02/07/2021 ADMITTING PHYSICIAN: Ivor Costa, MD  DATE OF DISCHARGE: 02/08/2021  PRIMARY CARE PHYSICIAN: Donnie Coffin, MD    ADMISSION DIAGNOSIS:  DKA (diabetic ketoacidosis) ([place]) [E11.10]  DISCHARGE DIAGNOSIS:  DKA-Type 2 DM--resolved CKD-IIIb  SECONDARY DIAGNOSIS:   Past Medical History:  Diagnosis Date   Arthritis    Diabetes mellitus without complication (HCC)    DJD (degenerative joint disease)    GERD (gastroesophageal reflux disease)    Hyperlipidemia    Hypertension    Sciatic nerve pain    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 male with medical history significant of diabetes mellitus, hypertension, hyperlipidemia, GERD, anxiety, OSA on CPAP, secondary erythrocytosis, BPH, tobacco abuse, CKD-3A, questionable bladder cancer (patient denies this diagnosis), who presents with generalized weakness.  pt was found to have DKA (blood sugar 655, bicarbonate 20, anion gap 21, positive ketones in urinalysis at 15) Creat was 3.60(baseline 1.73)  DKA (diabetic ketoacidosis) ([place]): pt has blood sugar 655, bicarbonate 20, anion gap 21, positive ketones in urinalysis at 15. Pending beta hydroxybutyric acid level. -- Patient is off insulin drip. Anion gap closed. Dehydration improved. Patient overall feeling back to near baseline.   Type II diabetes mellitus with renal manifestations: No A1c on record.  Obviously poorly controlled.   --Patient is taking Trulicity, metformin, Jardiance, Humalog and Lantus 80 unit in morning and 120 unit in evening at home -I have discontinued patient's Jardiance and metformin-- given elevated creatinine and former predisposes to DKA -- patient advised to continue his insulin and sugar checks. He is also advised to follow-up with PCP  closely. -- Diet and lifestyle discussed.   Essential hypertension -IV hydralazine as needed -Hold Lasix and Cozaar due to worsening renal function--now improved creat to near baseline--resumed meds --BP 132/90    Smoking -Nicotine patch   Acute renal failure superimposed on stage 3a chronic kidney disease ([place]): Likely due to dehydration and continuation of diuretics and Cozaar.  Renal ultrasound is negative for hydronephrosis.  Urinalysis negative for UTI --IV fluid as above -- came in with creatinine of 3.60--- in the setting of DKA received IV fluids creatinine down to 1.9.   HLD (hyperlipidemia) -Zocor   Anxiety -Continue home as needed Xanax   Fall at home, initial encounter: Patient is strongly denies any head or neck injury.  Refused CT of head and neck. -PT/OT   Hyperkalemia: Potassium 6.6.  No T wave peaking on EKG.  Likely due to combination of DKA and worsening renal function. -Patiromer 25.2 gram was ordered in ED -give 1 gram of calcium gluconate -IV fluid as above -Patient is on insulin drip, expecting correction -- potassium down to 4.1   Elevated lactic acid level: Lactic acid 2.3, likely due to dehydration.  No source of infection identified. -Trend lactic acid level 1.1   overall hemodynamically stable. Mild tachycardia although asymptomatic. Blood pressure stable. Patient is requesting to go home since he is feeling near baseline. He is in agreement to follow-up with his primary care physician and continue check sugars and take insulin. Couple diabetes meds were discontinued which was discussed with patient. He did voice understanding.      CONSULTS OBTAINED:    DRUG ALLERGIES:   Allergies  Allergen  Reactions   Nsaids Other (See Comments)    AKI AKI     DISCHARGE MEDICATIONS:   Allergies as of 02/08/2021       Reactions   Nsaids Other (See Comments)   AKI AKI        Medication List     STOP taking these medications    Jardiance  25 MG Tabs tablet Generic drug: empagliflozin   metFORMIN 1000 MG tablet Commonly known as: GLUCOPHAGE   mirabegron ER 25 MG Tb24 tablet Commonly known as: MYRBETRIQ   Testosterone 20.25 MG/ACT (1.62%) Gel       TAKE these medications    albuterol 108 (90 Base) MCG/ACT inhaler Commonly known as: VENTOLIN HFA Inhale 1 puff into the lungs every 6 (six) hours as needed for wheezing or shortness of breath.   ALPRAZolam 1 MG tablet Commonly known as: XANAX Take 1 mg by mouth 4 (four) times daily as needed.   AMBULATORY NON FORMULARY MEDICATION Trimix (30/1/10)-(Pap/Phent/PGE)  Dosage: Inject 0.3 cc and may increase 0.1cc to achieve and erection lasting no longer than 1 hour per injection   Vial 70ml  Qty #5 refills 2  [facility] [phone] Fax [phone]   amLODipine 10 MG tablet Commonly known as: NORVASC Take 10 mg by mouth daily.   aspirin EC 325 MG tablet Take 325 mg by mouth daily.   baclofen 10 MG tablet Commonly known as: LIORESAL Take 10 mg by mouth 2 (two) times daily.   cholecalciferol 10 MCG (400 UNIT) Tabs tablet Commonly known as: VITAMIN D3 Take 10,000 Units by mouth daily.   Fish Oil 1000 MG Caps Take 1 capsule by mouth daily.   furosemide 20 MG tablet Commonly known as: LASIX Take 20 mg by mouth daily.   insulin glargine 100 UNIT/ML injection Commonly known as: LANTUS Inject 80 Units into the skin daily. 80 units in the am and 120 units at bedtime   insulin lispro 100 UNIT/ML KiwkPen Commonly known as: HUMALOG 40 Units.   Insulin Pen Needle 31G X 5 MM Misc   KETO PO Take 1 capsule by mouth.   losartan 100 MG tablet Commonly known as: COZAAR Take 100 mg by mouth daily.   ONE TOUCH ULTRA TEST test strip Generic drug: glucose blood   Oxycodone HCl 20 MG Tabs Take 20 mg by mouth 4 (four) times daily as needed (pain).   sildenafil 20 MG tablet Commonly known as: REVATIO TAKE 2 TO 5 TABLETS BY MOUTH AS NEEDED FOR  ERECTILE DYSFUNCTION   simvastatin 40 MG tablet Commonly known as: ZOCOR Take 40 mg by mouth daily.   Trulicity 1.5 NO/7.0JG Sopn Generic drug: Dulaglutide Inject 1.5 mg into the skin once a week.   vitamin C 250 MG tablet Commonly known as: ASCORBIC ACID Take 250 mg by mouth daily.   zolpidem 10 MG tablet Commonly known as: AMBIEN Take 10 mg by mouth at bedtime as needed.        If you experience worsening of your admission symptoms, develop shortness of breath, life threatening emergency, suicidal or homicidal thoughts you must seek medical attention immediately by calling 911 or calling your MD immediately  if symptoms less severe.  You Must read complete instructions/literature along with all the possible adverse reactions/side effects for all the Medicines you take and that have been prescribed to you. Take any new Medicines after you have completely understood and accept all the possible adverse reactions/side effects.   Please note  You were  cared for by a hospitalist during your hospital stay. If you have any questions about your discharge medications or the care you received while you were in the hospital after you are discharged, you can call the unit and asked to speak with the hospitalist on call if the hospitalist that took care of you is not available. Once you are discharged, your primary care physician will handle any further medical issues. Please note that NO REFILLS for any discharge medications will be authorized once you are discharged, as it is imperative that you return to your primary care physician (or establish a relationship with a primary care physician if you do not have one) for your aftercare needs so that they can reassess your need for medications and monitor your lab values. Today   SUBJECTIVE  linger. Wants to go home. No family at bedside. Tolerating PO diet.   VITAL SIGNS:  Blood pressure 132/90, pulse (!) 115, temperature 98.1 F (36.7 C),  resp. rate 20, height 5\' 11"  (1.803 m), weight 113.4 kg, SpO2 98 %.  I/O:   Intake/Output Summary (Last 24 hours) at 02/08/2021 1337 Last data filed at 02/08/2021 0400 Gross per 24 hour  Intake 2711.75 ml  Output 1850 ml  Net 861.75 ml    PHYSICAL EXAMINATION:  GENERAL:  65 y.o.-year-old patient lying in the bed with no acute distress. Morbid obesity LUNGS: Normal breath sounds bilaterally, no wheezing, rales,rhonchi or crepitation. No use of accessory muscles of respiration.  CARDIOVASCULAR: S1, S2 normal. No murmurs, rubs, or gallops. Mild tachycardia ABDOMEN: Soft, non-tender, non-distended. Bowel sounds present. No organomegaly or mass.  EXTREMITIES: No pedal edema, cyanosis, or clubbing.  NEUROLOGIC: non-focal PSYCHIATRIC:  patient is alert and oriented x 3.  SKIN: No obvious rash, lesion, or ulcer.   DATA REVIEW:   CBC  Recent Labs  Lab 02/07/21 0908  WBC 8.8  HGB 18.9*  HCT 52.7*  PLT 269    Chemistries  Recent Labs  Lab 02/07/21 0908 02/07/21 1434 02/07/21 1847 02/08/21 0628  NA 121*   < > 129* 135  K 6.6*   < > 4.5 4.1  CL 80*   < > 95* 102  CO2 20*   < > 24 26  GLUCOSE 655*   < > 178* 126*  BUN 66*   < > 63* 44*  CREATININE 3.60*   < > 2.75* 1.94*  CALCIUM 8.8*   < > 8.8* 8.3*  MG  --   --  2.7*  --   AST 12*  --   --   --   ALT 13  --   --   --   ALKPHOS 80  --   --   --   BILITOT 1.6*  --   --   --    < > = values in this interval not displayed.    Microbiology Results   Recent Results (from the past 240 hour(s))  Resp Panel by RT-PCR (Flu A&B, Covid) Nasopharyngeal Swab     Status: None   Collection Time: 02/07/21  9:08 AM   Specimen: Nasopharyngeal Swab; Nasopharyngeal(NP) swabs in vial transport medium  Result Value Ref Range Status   SARS Coronavirus 2 by RT PCR NEGATIVE NEGATIVE Final    Comment: (NOTE) SARS-CoV-2 target nucleic acids are NOT DETECTED.  The SARS-CoV-2 RNA is generally detectable in upper respiratory specimens  during the acute phase of infection. The lowest concentration of SARS-CoV-2 viral copies this assay can detect is 138 copies/mL.  A negative result does not preclude SARS-Cov-2 infection and should not be used as the sole basis for treatment or other patient management decisions. A negative result may occur with  improper specimen collection/handling, submission of specimen other than nasopharyngeal swab, presence of viral mutation(s) within the areas targeted by this assay, and inadequate number of viral copies(<138 copies/mL). A negative result must be combined with clinical observations, patient history, and epidemiological information. The expected result is Negative.  Fact Sheet for Patients:  EntrepreneurPulse.com.au  Fact Sheet for Healthcare Providers:  IncredibleEmployment.be  This test is no t yet approved or cleared by the [place] FDA and  has been authorized for detection and/or diagnosis of SARS-CoV-2 by FDA under an Emergency Use Authorization (EUA). This EUA will remain  in effect (meaning this test can be used) for the duration of the COVID-19 declaration under Section 564(b)(1) of the Act, 21 U.S.C.section 360bbb-3(b)(1), unless the authorization is terminated  or revoked sooner.       Influenza A by PCR NEGATIVE NEGATIVE Final   Influenza B by PCR NEGATIVE NEGATIVE Final    Comment: (NOTE) The Xpert Xpress SARS-CoV-2/FLU/RSV plus assay is intended as an aid in the diagnosis of influenza from Nasopharyngeal swab specimens and should not be used as a sole basis for treatment. Nasal washings and aspirates are unacceptable for Xpert Xpress SARS-CoV-2/FLU/RSV testing.  Fact Sheet for Patients: EntrepreneurPulse.com.au  Fact Sheet for Healthcare Providers: IncredibleEmployment.be  This test is not yet approved or cleared by the [place] FDA and has been authorized for detection  and/or diagnosis of SARS-CoV-2 by FDA under an Emergency Use Authorization (EUA). This EUA will remain in effect (meaning this test can be used) for the duration of the COVID-19 declaration under Section 564(b)(1) of the Act, 21 U.S.C. section 360bbb-3(b)(1), unless the authorization is terminated or revoked.  Performed at [facility], [address] [place], [place] [address]     RADIOLOGY:  US Renal  Result Date: 02/07/2021 CLINICAL DATA:  Acute kidney injury. EXAM: RENAL / URINARY TRACT ULTRASOUND COMPLETE COMPARISON:  MRI 07/13/2019 FINDINGS: Right Kidney: Renal measurements: 11 x 5.3 x 4.9 cm = volume: 150 mL. Echogenicity within normal limits. No mass or hydronephrosis visualized. Left Kidney: Renal measurements: 11.3 x 5.5 x 4.9 cm = volume: 159 mL. Echogenicity within normal limits. No mass or hydronephrosis visualized. Bladder: Appears normal for degree of bladder distention. Other: None. IMPRESSION: No hydronephrosis. Electronically Signed   By: Dahlia Bailiff M.D.   On: 02/07/2021 11:25   DG Chest Port 1 View  Result Date: 02/07/2021 CLINICAL DATA:  Questionable sepsis. EXAM: PORTABLE CHEST 1 VIEW COMPARISON:  05/12/2005 FINDINGS: The cardiomediastinal silhouette is unchanged with normal heart size. There is new elevation of the right hemidiaphragm with mild right basilar lung opacity likely reflecting atelectasis. The left lung is clear. No sizable pleural effusion or pneumothorax is identified. No acute osseous abnormality is seen. IMPRESSION: New elevation of the right hemidiaphragm with mild right basilar atelectasis. Electronically Signed   By: Logan Bores M.D.   On: 02/07/2021 09:08     CODE STATUS:     Code Status Orders  (From admission, onward)           Start     Ordered   02/07/21 1103  Full code  Continuous        02/07/21 1104           Code Status History     This patient has a  current code status but no historical code status.       Advance Directive Documentation    Flowsheet Row Most Recent Value  Type of Advance Directive Healthcare Power of Attorney  Pre-existing out of facility DNR order (yellow form or pink MOST form) --  "MOST" Form in Place? --        TOTAL TIME TAKING CARE OF THIS PATIENT: 35 minutes.    Fritzi Mandes M.D  [facility]    CC: Primary care physician; Donnie Coffin, MD

## 2021-02-08 NOTE — Discharge Instructions (Signed)
Continue check your sugars 3 to 4 times a day and adjust your insulin accordingly.

## 2021-02-08 NOTE — Care Management Obs Status (Signed)
[facility] NOTIFICATION   Patient Details  Name: Corey Foster MRN: [id number] Date of Birth: 07/16/1955   Medicare Observation Status Notification Given:  Yes    Anselm Pancoast, RN 02/08/2021, 2:00 PM

## 2021-02-12 LAB — CULTURE, BLOOD (SINGLE): Culture: NO GROWTH

## 2021-02-13 ENCOUNTER — Other Ambulatory Visit: Payer: Self-pay | Admitting: Urology

## 2021-02-13 DIAGNOSIS — D751 Secondary polycythemia: Secondary | ICD-10-CM

## 2021-02-21 ENCOUNTER — Other Ambulatory Visit: Payer: Self-pay | Admitting: Urology

## 2021-02-21 DIAGNOSIS — D751 Secondary polycythemia: Secondary | ICD-10-CM

## 2021-02-21 MED ORDER — SILDENAFIL CITRATE 20 MG PO TABS: ORAL_TABLET | ORAL | 1 refills | Status: DC

## 2021-02-21 NOTE — Telephone Encounter (Signed)
Patient called again requesting rx to be sent to [facility]

## 2021-04-16 ENCOUNTER — Other Ambulatory Visit: Payer: 59

## 2021-05-10 ENCOUNTER — Other Ambulatory Visit: Payer: Self-pay | Admitting: Urology

## 2021-05-10 ENCOUNTER — Other Ambulatory Visit: Payer: Self-pay | Admitting: *Deleted

## 2021-05-10 DIAGNOSIS — D751 Secondary polycythemia: Secondary | ICD-10-CM

## 2021-05-10 MED ORDER — SILDENAFIL CITRATE 20 MG PO TABS: ORAL_TABLET | ORAL | 1 refills | Status: DC

## 2021-05-30 ENCOUNTER — Other Ambulatory Visit: Payer: Self-pay | Admitting: Urology

## 2021-07-10 ENCOUNTER — Ambulatory Visit (INDEPENDENT_AMBULATORY_CARE_PROVIDER_SITE_OTHER): Payer: 59 | Admitting: Urology

## 2021-07-10 ENCOUNTER — Encounter: Payer: Self-pay | Admitting: Urology

## 2021-07-10 VITALS — BP 147/92 | HR 96 | Ht 71.0 in | Wt 252.0 lb

## 2021-07-10 DIAGNOSIS — R159 Full incontinence of feces: Secondary | ICD-10-CM | POA: Diagnosis not present

## 2021-07-10 LAB — URINALYSIS, COMPLETE
Bilirubin, UA: NEGATIVE
Ketones, UA: NEGATIVE
Leukocytes,UA: NEGATIVE
Nitrite, UA: NEGATIVE
Protein,UA: NEGATIVE
RBC, UA: NEGATIVE
Specific Gravity, UA: 1.01 (ref 1.005–1.030)
Urobilinogen, Ur: 0.2 mg/dL (ref 0.2–1.0)
pH, UA: 5.5 (ref 5.0–7.5)

## 2021-07-10 LAB — MICROSCOPIC EXAMINATION: Bacteria, UA: NONE SEEN
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
## 2021-08-16 ENCOUNTER — Other Ambulatory Visit: Payer: Self-pay | Admitting: Urology

## 2021-08-19 ENCOUNTER — Encounter: Payer: Self-pay | Admitting: Urology

## 2021-08-19 ENCOUNTER — Ambulatory Visit (INDEPENDENT_AMBULATORY_CARE_PROVIDER_SITE_OTHER): Payer: 59 | Admitting: Urology

## 2021-08-19 VITALS — BP 111/70 | HR 111 | Ht 71.0 in | Wt 260.0 lb

## 2021-08-19 DIAGNOSIS — Z8551 Personal history of malignant neoplasm of bladder: Secondary | ICD-10-CM

## 2021-08-19 LAB — URINALYSIS, COMPLETE
Bilirubin, UA: NEGATIVE
Ketones, UA: NEGATIVE
Leukocytes,UA: NEGATIVE
Nitrite, UA: NEGATIVE
Protein,UA: NEGATIVE
RBC, UA: NEGATIVE
Specific Gravity, UA: 1.01 (ref 1.005–1.030)
Urobilinogen, Ur: 4 mg/dL — ABNORMAL HIGH (ref 0.2–1.0)
pH, UA: 7 (ref 5.0–7.5)

## 2021-08-19 LAB — MICROSCOPIC EXAMINATION
Bacteria, UA: NONE SEEN
RBC, Urine: NONE SEEN /hpf (ref 0–2)

## 2021-12-23 ENCOUNTER — Other Ambulatory Visit: Payer: Self-pay | Admitting: Urology

## 2021-12-23 DIAGNOSIS — D751 Secondary polycythemia: Secondary | ICD-10-CM

## 2022-02-13 ENCOUNTER — Telehealth: Payer: Self-pay | Admitting: Urology

## 2022-02-13 MED ORDER — AMBULATORY NON FORMULARY MEDICATION: 2 refills | Status: DC

## 2022-02-13 NOTE — Telephone Encounter (Signed)
Patient called to request refill for Trimix. He said that he realized what he has is out of date. He requested that refill be sent to pharmacy in [place].

## 2022-02-13 NOTE — Telephone Encounter (Signed)
Patient notified RX sent to pharmacy.

## 2022-03-11 ENCOUNTER — Other Ambulatory Visit: Payer: Self-pay | Admitting: Urology

## 2022-03-14 NOTE — Telephone Encounter (Signed)
Need testosterone level or PSA.  Called patient and scheduled lab appt.

## 2022-03-19 ENCOUNTER — Encounter: Payer: Self-pay | Admitting: Internal Medicine

## 2022-03-26 ENCOUNTER — Other Ambulatory Visit: Payer: 59

## 2022-04-11 ENCOUNTER — Other Ambulatory Visit: Payer: Self-pay | Admitting: Urology

## 2022-04-11 DIAGNOSIS — D751 Secondary polycythemia: Secondary | ICD-10-CM

## 2022-04-24 ENCOUNTER — Other Ambulatory Visit: Payer: Self-pay | Admitting: *Deleted

## 2022-04-24 DIAGNOSIS — C679 Malignant neoplasm of bladder, unspecified: Secondary | ICD-10-CM

## 2022-04-24 DIAGNOSIS — N3941 Urge incontinence: Secondary | ICD-10-CM

## 2022-04-24 DIAGNOSIS — Z8551 Personal history of malignant neoplasm of bladder: Secondary | ICD-10-CM

## 2022-04-24 DIAGNOSIS — E291 Testicular hypofunction: Secondary | ICD-10-CM

## 2022-04-25 ENCOUNTER — Other Ambulatory Visit: Payer: 59

## 2022-04-25 DIAGNOSIS — C679 Malignant neoplasm of bladder, unspecified: Secondary | ICD-10-CM

## 2022-04-25 DIAGNOSIS — E291 Testicular hypofunction: Secondary | ICD-10-CM

## 2022-04-25 DIAGNOSIS — Z8551 Personal history of malignant neoplasm of bladder: Secondary | ICD-10-CM

## 2022-04-25 DIAGNOSIS — N3941 Urge incontinence: Secondary | ICD-10-CM

## 2022-04-26 LAB — TESTOSTERONE: Testosterone: 193 ng/dL — ABNORMAL LOW (ref 264–916)

## 2022-04-26 LAB — PSA: Prostate Specific Ag, Serum: 0.5 ng/mL (ref 0.0–4.0)

## 2022-05-09 ENCOUNTER — Telehealth: Payer: Self-pay | Admitting: Urology

## 2022-05-09 ENCOUNTER — Other Ambulatory Visit: Payer: Self-pay | Admitting: *Deleted

## 2022-05-09 NOTE — Telephone Encounter (Signed)
Pt LMOM requesting Testosterone refill to be sent to [facility].

## 2022-05-12 MED ORDER — TESTOSTERONE 20.25 MG/ACT (1.62%) TD GEL: TRANSDERMAL | 2 refills | Status: DC

## 2022-05-14 ENCOUNTER — Telehealth: Payer: Self-pay | Admitting: *Deleted

## 2022-05-14 NOTE — Telephone Encounter (Signed)
-----   Message from Abbie Sons, MD sent at 05/11/2022  1:42 PM EDT ----- Testosterone level was low at 193.  There is a refill request.  Was he still applying the gel when this level was drawn?Corey Foster  PSA stable 0.5

## 2022-05-14 NOTE — Telephone Encounter (Signed)
Notified patient as instructed, patient has not used the gel in three months.

## 2022-05-28 ENCOUNTER — Other Ambulatory Visit: Payer: Self-pay | Admitting: Urology

## 2022-05-28 DIAGNOSIS — D751 Secondary polycythemia: Secondary | ICD-10-CM

## 2022-06-03 ENCOUNTER — Other Ambulatory Visit: Payer: Self-pay | Admitting: Urology

## 2022-06-03 DIAGNOSIS — D751 Secondary polycythemia: Secondary | ICD-10-CM

## 2022-06-27 ENCOUNTER — Other Ambulatory Visit: Payer: Self-pay | Admitting: Urology

## 2022-06-27 DIAGNOSIS — D751 Secondary polycythemia: Secondary | ICD-10-CM

## 2022-08-13 ENCOUNTER — Encounter: Payer: Self-pay | Admitting: Internal Medicine

## 2022-08-20 ENCOUNTER — Other Ambulatory Visit: Payer: Medicare HMO | Admitting: Urology

## 2022-08-20 ENCOUNTER — Encounter: Payer: Self-pay | Admitting: Urology

## 2022-10-09 ENCOUNTER — Other Ambulatory Visit: Payer: Self-pay | Admitting: Urology

## 2022-10-09 DIAGNOSIS — D751 Secondary polycythemia: Secondary | ICD-10-CM

## 2022-10-09 NOTE — Telephone Encounter (Signed)
Received a refill request on sildenafil.  He has not been seen since June 2023 and missed an appointment for surveillance cystoscopy.  Recommend rescheduling.  Limited Rx sildenafil was sent

## 2022-11-26 ENCOUNTER — Other Ambulatory Visit: Payer: Self-pay | Admitting: Urology

## 2022-11-27 ENCOUNTER — Encounter: Payer: Self-pay | Admitting: Intensive Care

## 2022-11-27 ENCOUNTER — Telehealth (INDEPENDENT_AMBULATORY_CARE_PROVIDER_SITE_OTHER): Payer: Self-pay | Admitting: Nurse Practitioner

## 2022-11-27 ENCOUNTER — Emergency Department: Payer: Medicare HMO

## 2022-11-27 ENCOUNTER — Other Ambulatory Visit: Payer: Self-pay

## 2022-11-27 ENCOUNTER — Encounter: Payer: Self-pay | Admitting: Internal Medicine

## 2022-11-27 ENCOUNTER — Inpatient Hospital Stay
Admission: EM | Admit: 2022-11-27 | Discharge: 2022-11-29 | DRG: 253 | Disposition: A | Discharge status: Home / Self Care | Payer: Medicare HMO | Attending: Internal Medicine | Admitting: Internal Medicine

## 2022-11-27 DIAGNOSIS — E1165 Type 2 diabetes mellitus with hyperglycemia: Secondary | ICD-10-CM | POA: Diagnosis present

## 2022-11-27 DIAGNOSIS — E1142 Type 2 diabetes mellitus with diabetic polyneuropathy: Secondary | ICD-10-CM | POA: Diagnosis present

## 2022-11-27 DIAGNOSIS — F32A Depression, unspecified: Secondary | ICD-10-CM | POA: Diagnosis present

## 2022-11-27 DIAGNOSIS — E1122 Type 2 diabetes mellitus with diabetic chronic kidney disease: Secondary | ICD-10-CM | POA: Diagnosis present

## 2022-11-27 DIAGNOSIS — F17210 Nicotine dependence, cigarettes, uncomplicated: Secondary | ICD-10-CM | POA: Diagnosis present

## 2022-11-27 DIAGNOSIS — Z794 Long term (current) use of insulin: Secondary | ICD-10-CM | POA: Diagnosis not present

## 2022-11-27 DIAGNOSIS — I129 Hypertensive chronic kidney disease with stage 1 through stage 4 chronic kidney disease, or unspecified chronic kidney disease: Secondary | ICD-10-CM | POA: Diagnosis present

## 2022-11-27 DIAGNOSIS — I96 Gangrene, not elsewhere classified: Principal | ICD-10-CM

## 2022-11-27 DIAGNOSIS — G4700 Insomnia, unspecified: Secondary | ICD-10-CM | POA: Diagnosis present

## 2022-11-27 DIAGNOSIS — I10 Essential (primary) hypertension: Secondary | ICD-10-CM | POA: Diagnosis not present

## 2022-11-27 DIAGNOSIS — N1832 Chronic kidney disease, stage 3b: Secondary | ICD-10-CM | POA: Diagnosis present

## 2022-11-27 DIAGNOSIS — A419 Sepsis, unspecified organism: Secondary | ICD-10-CM

## 2022-11-27 DIAGNOSIS — G4730 Sleep apnea, unspecified: Secondary | ICD-10-CM | POA: Diagnosis present

## 2022-11-27 DIAGNOSIS — Z8249 Family history of ischemic heart disease and other diseases of the circulatory system: Secondary | ICD-10-CM

## 2022-11-27 DIAGNOSIS — Z7985 Long-term (current) use of injectable non-insulin antidiabetic drugs: Secondary | ICD-10-CM

## 2022-11-27 DIAGNOSIS — E11649 Type 2 diabetes mellitus with hypoglycemia without coma: Secondary | ICD-10-CM | POA: Diagnosis not present

## 2022-11-27 DIAGNOSIS — Z7984 Long term (current) use of oral hypoglycemic drugs: Secondary | ICD-10-CM

## 2022-11-27 DIAGNOSIS — E291 Testicular hypofunction: Secondary | ICD-10-CM | POA: Diagnosis present

## 2022-11-27 DIAGNOSIS — E871 Hypo-osmolality and hyponatremia: Secondary | ICD-10-CM | POA: Diagnosis present

## 2022-11-27 DIAGNOSIS — D751 Secondary polycythemia: Secondary | ICD-10-CM | POA: Diagnosis present

## 2022-11-27 DIAGNOSIS — E669 Obesity, unspecified: Secondary | ICD-10-CM | POA: Diagnosis present

## 2022-11-27 DIAGNOSIS — F419 Anxiety disorder, unspecified: Secondary | ICD-10-CM | POA: Diagnosis present

## 2022-11-27 DIAGNOSIS — Z7982 Long term (current) use of aspirin: Secondary | ICD-10-CM

## 2022-11-27 DIAGNOSIS — M869 Osteomyelitis, unspecified: Secondary | ICD-10-CM | POA: Diagnosis not present

## 2022-11-27 DIAGNOSIS — K219 Gastro-esophageal reflux disease without esophagitis: Secondary | ICD-10-CM | POA: Diagnosis present

## 2022-11-27 DIAGNOSIS — Z6834 Body mass index (BMI) 34.0-34.9, adult: Secondary | ICD-10-CM

## 2022-11-27 DIAGNOSIS — N179 Acute kidney failure, unspecified: Secondary | ICD-10-CM | POA: Diagnosis present

## 2022-11-27 DIAGNOSIS — N138 Other obstructive and reflux uropathy: Secondary | ICD-10-CM | POA: Diagnosis present

## 2022-11-27 DIAGNOSIS — R739 Hyperglycemia, unspecified: Secondary | ICD-10-CM | POA: Diagnosis present

## 2022-11-27 DIAGNOSIS — N529 Male erectile dysfunction, unspecified: Secondary | ICD-10-CM | POA: Diagnosis present

## 2022-11-27 DIAGNOSIS — F17200 Nicotine dependence, unspecified, uncomplicated: Secondary | ICD-10-CM | POA: Diagnosis present

## 2022-11-27 DIAGNOSIS — N401 Enlarged prostate with lower urinary tract symptoms: Secondary | ICD-10-CM | POA: Diagnosis present

## 2022-11-27 DIAGNOSIS — E1152 Type 2 diabetes mellitus with diabetic peripheral angiopathy with gangrene: Secondary | ICD-10-CM | POA: Diagnosis present

## 2022-11-27 DIAGNOSIS — Z886 Allergy status to analgesic agent status: Secondary | ICD-10-CM | POA: Diagnosis not present

## 2022-11-27 DIAGNOSIS — Z716 Tobacco abuse counseling: Secondary | ICD-10-CM

## 2022-11-27 DIAGNOSIS — E1129 Type 2 diabetes mellitus with other diabetic kidney complication: Secondary | ICD-10-CM | POA: Diagnosis present

## 2022-11-27 DIAGNOSIS — E870 Hyperosmolality and hypernatremia: Secondary | ICD-10-CM | POA: Insufficient documentation

## 2022-11-27 DIAGNOSIS — Z79899 Other long term (current) drug therapy: Secondary | ICD-10-CM

## 2022-11-27 DIAGNOSIS — I70262 Atherosclerosis of native arteries of extremities with gangrene, left leg: Secondary | ICD-10-CM | POA: Diagnosis not present

## 2022-11-27 DIAGNOSIS — Z79890 Hormone replacement therapy (postmenopausal): Secondary | ICD-10-CM

## 2022-11-27 DIAGNOSIS — E785 Hyperlipidemia, unspecified: Secondary | ICD-10-CM | POA: Diagnosis present

## 2022-11-27 DIAGNOSIS — Z833 Family history of diabetes mellitus: Secondary | ICD-10-CM

## 2022-11-27 LAB — LACTIC ACID, PLASMA
Lactic Acid, Venous: 2 mmol/L (ref 0.5–1.9)
Lactic Acid, Venous: 1.4 mmol/L (ref 0.5–1.9)

## 2022-11-27 LAB — CBC WITH DIFFERENTIAL/PLATELET
Abs Immature Granulocytes: 0.06 10*3/uL (ref 0.00–0.07)
Basophils Absolute: 0 10*3/uL (ref 0.0–0.1)
Basophils Relative: 0 %
Eosinophils Absolute: 0 10*3/uL (ref 0.0–0.5)
Eosinophils Relative: 0 %
HCT: 50.1 % (ref 39.0–52.0)
Hemoglobin: 17.4 g/dL — ABNORMAL HIGH (ref 13.0–17.0)
Immature Granulocytes: 1 %
Lymphocytes Relative: 25 %
Lymphs Abs: 2 10*3/uL (ref 0.7–4.0)
MCH: 31.8 pg (ref 26.0–34.0)
MCHC: 34.7 g/dL (ref 30.0–36.0)
MCV: 91.4 fL (ref 80.0–100.0)
Monocytes Absolute: 0.7 10*3/uL (ref 0.1–1.0)
Monocytes Relative: 9 %
Neutro Abs: 5.1 10*3/uL (ref 1.7–7.7)
Neutrophils Relative %: 65 %
Platelets: 229 10*3/uL (ref 150–400)
RBC: 5.48 MIL/uL (ref 4.22–5.81)
RDW: 12.4 % (ref 11.5–15.5)
WBC: 7.9 10*3/uL (ref 4.0–10.5)
nRBC: 0 % (ref 0.0–0.2)

## 2022-11-27 LAB — COMPREHENSIVE METABOLIC PANEL
ALT: 13 U/L (ref 0–44)
AST: 12 U/L — ABNORMAL LOW (ref 15–41)
Albumin: 3.2 g/dL — ABNORMAL LOW (ref 3.5–5.0)
Alkaline Phosphatase: 80 U/L (ref 38–126)
Anion gap: 14 (ref 5–15)
BUN: 47 mg/dL — ABNORMAL HIGH (ref 8–23)
CO2: 25 mmol/L (ref 22–32)
Calcium: 8.9 mg/dL (ref 8.9–10.3)
Chloride: 89 mmol/L — ABNORMAL LOW (ref 98–111)
Creatinine, Ser: 2.02 mg/dL — ABNORMAL HIGH (ref 0.61–1.24)
GFR, Estimated: 35 mL/min — ABNORMAL LOW (ref >60)
Glucose, Bld: 470 mg/dL — ABNORMAL HIGH (ref 70–99)
Potassium: 4.7 mmol/L (ref 3.5–5.1)
Sodium: 128 mmol/L — ABNORMAL LOW (ref 135–145)
Total Bilirubin: 0.4 mg/dL (ref 0.3–1.2)
Total Protein: 7.9 g/dL (ref 6.5–8.1)

## 2022-11-27 LAB — GLUCOSE, CAPILLARY: Glucose-Capillary: 383 mg/dL — ABNORMAL HIGH (ref 70–99)

## 2022-11-27 LAB — CBG MONITORING, ED: Glucose-Capillary: 381 mg/dL — ABNORMAL HIGH (ref 70–99)

## 2022-11-27 MED ORDER — LORAZEPAM 2 MG/ML IJ SOLN
0.5000 mg | Freq: Four times a day (QID) | INTRAMUSCULAR | Status: DC | PRN
Start: 2022-11-27 — End: 2022-11-27

## 2022-11-27 MED ORDER — VITAMIN C 500 MG PO TABS
250.0000 mg | ORAL_TABLET | Freq: Every day | ORAL | Status: DC
  Administered 2022-11-28 – 2022-11-29 (×2): 250 mg via ORAL
  Filled 2022-11-27 (×2): qty 1

## 2022-11-27 MED ORDER — ACETAMINOPHEN 650 MG RE SUPP: 650.0000 mg | Freq: Four times a day (QID) | RECTAL | Status: DC | PRN

## 2022-11-27 MED ORDER — SENNOSIDES-DOCUSATE SODIUM 8.6-50 MG PO TABS: 1.0000 | ORAL_TABLET | Freq: Every evening | ORAL | Status: DC | PRN

## 2022-11-27 MED ORDER — ZOLPIDEM TARTRATE 5 MG PO TABS: 10.0000 mg | ORAL_TABLET | Freq: Every evening | ORAL | Status: DC | PRN

## 2022-11-27 MED ORDER — INSULIN GLARGINE-YFGN 100 UNIT/ML ~~LOC~~ SOLN
60.0000 [IU] | Freq: Every day | SUBCUTANEOUS | Status: DC
  Filled 2022-11-27: qty 0.6

## 2022-11-27 MED ORDER — HYDRALAZINE HCL 20 MG/ML IJ SOLN: 5.0000 mg | Freq: Four times a day (QID) | INTRAMUSCULAR | Status: DC | PRN

## 2022-11-27 MED ORDER — VITAMIN D 25 MCG (1000 UNIT) PO TABS: 10000.0000 [IU] | ORAL_TABLET | Freq: Every day | ORAL | Status: DC

## 2022-11-27 MED ORDER — VANCOMYCIN HCL 500 MG/100ML IV SOLN
500.0000 mg | Freq: Once | INTRAVENOUS | Status: AC
  Administered 2022-11-27: 500 mg via INTRAVENOUS
  Filled 2022-11-27: qty 100

## 2022-11-27 MED ORDER — NICOTINE POLACRILEX 2 MG MT GUM: 2.0000 mg | CHEWING_GUM | OROMUCOSAL | Status: DC | PRN

## 2022-11-27 MED ORDER — SODIUM CHLORIDE 0.9 % IV BOLUS (SEPSIS)
1000.0000 mL | Freq: Once | INTRAVENOUS | Status: AC
  Administered 2022-11-27: 1000 mL via INTRAVENOUS

## 2022-11-27 MED ORDER — VITAMIN D 25 MCG (1000 UNIT) PO TABS
1000.0000 [IU] | ORAL_TABLET | Freq: Every day | ORAL | Status: DC
  Administered 2022-11-28 – 2022-11-29 (×2): 1000 [IU] via ORAL
  Filled 2022-11-27 (×2): qty 1

## 2022-11-27 MED ORDER — SODIUM CHLORIDE 0.9 % IV SOLN: Freq: Once | INTRAVENOUS | Status: AC

## 2022-11-27 MED ORDER — SIMVASTATIN 20 MG PO TABS
40.0000 mg | ORAL_TABLET | Freq: Every day | ORAL | Status: DC
  Administered 2022-11-27 – 2022-11-28 (×2): 40 mg via ORAL
  Filled 2022-11-27: qty 2
  Filled 2022-11-27: qty 4

## 2022-11-27 MED ORDER — ONDANSETRON HCL 4 MG PO TABS: 4.0000 mg | ORAL_TABLET | Freq: Four times a day (QID) | ORAL | Status: DC | PRN

## 2022-11-27 MED ORDER — ACETAMINOPHEN 325 MG PO TABS
650.0000 mg | ORAL_TABLET | Freq: Four times a day (QID) | ORAL | Status: DC | PRN
  Administered 2022-11-29: 650 mg via ORAL
  Filled 2022-11-27: qty 2

## 2022-11-27 MED ORDER — SODIUM CHLORIDE 0.9 % IV BOLUS (SEPSIS)
500.0000 mL | Freq: Once | INTRAVENOUS | Status: AC
  Administered 2022-11-27: 500 mL via INTRAVENOUS

## 2022-11-27 MED ORDER — ALBUTEROL SULFATE (2.5 MG/3ML) 0.083% IN NEBU: 3.0000 mL | INHALATION_SOLUTION | Freq: Four times a day (QID) | RESPIRATORY_TRACT | Status: DC | PRN

## 2022-11-27 MED ORDER — OXYCODONE HCL 5 MG PO TABS
5.0000 mg | ORAL_TABLET | Freq: Four times a day (QID) | ORAL | Status: AC | PRN
  Administered 2022-11-29: 5 mg via ORAL
  Filled 2022-11-27: qty 1

## 2022-11-27 MED ORDER — METRONIDAZOLE 500 MG/100ML IV SOLN
500.0000 mg | Freq: Once | INTRAVENOUS | Status: AC
  Administered 2022-11-27: 500 mg via INTRAVENOUS
  Filled 2022-11-27: qty 100

## 2022-11-27 MED ORDER — INSULIN ASPART 100 UNIT/ML IJ SOLN
0.0000 [IU] | Freq: Three times a day (TID) | INTRAMUSCULAR | Status: DC
  Administered 2022-11-27: 15 [IU] via SUBCUTANEOUS
  Administered 2022-11-28 (×2): 5 [IU] via SUBCUTANEOUS
  Administered 2022-11-29: 15 [IU] via SUBCUTANEOUS
  Filled 2022-11-27 (×4): qty 1

## 2022-11-27 MED ORDER — SODIUM CHLORIDE 0.9 % IV SOLN
2.0000 g | Freq: Once | INTRAVENOUS | Status: AC
  Administered 2022-11-27: 2 g via INTRAVENOUS
  Filled 2022-11-27: qty 12.5

## 2022-11-27 MED ORDER — VANCOMYCIN HCL IN DEXTROSE 1-5 GM/200ML-% IV SOLN: 1000.0000 mg | Freq: Once | INTRAVENOUS | Status: DC

## 2022-11-27 MED ORDER — MORPHINE SULFATE (PF) 4 MG/ML IV SOLN: 4.0000 mg | INTRAVENOUS | Status: AC | PRN

## 2022-11-27 MED ORDER — ALPRAZOLAM 0.5 MG PO TABS
1.0000 mg | ORAL_TABLET | Freq: Four times a day (QID) | ORAL | Status: DC | PRN
  Administered 2022-11-27: 1 mg via ORAL
  Filled 2022-11-27: qty 2

## 2022-11-27 MED ORDER — ONDANSETRON HCL 4 MG/2ML IJ SOLN: 4.0000 mg | Freq: Four times a day (QID) | INTRAMUSCULAR | Status: DC | PRN

## 2022-11-27 MED ORDER — VANCOMYCIN HCL 2000 MG/400ML IV SOLN
2000.0000 mg | Freq: Once | INTRAVENOUS | Status: AC
  Administered 2022-11-27: 2000 mg via INTRAVENOUS
  Filled 2022-11-27: qty 400

## 2022-11-27 MED ORDER — NICOTINE 21 MG/24HR TD PT24: 21.0000 mg | MEDICATED_PATCH | Freq: Every day | TRANSDERMAL | Status: DC | PRN

## 2022-11-27 MED ORDER — VANCOMYCIN HCL 1250 MG/250ML IV SOLN
1250.0000 mg | INTRAVENOUS | Status: DC
  Administered 2022-11-28: 1250 mg via INTRAVENOUS
  Filled 2022-11-27 (×2): qty 250

## 2022-11-27 MED ORDER — LACTATED RINGERS IV SOLN: INTRAVENOUS | Status: DC

## 2022-11-27 MED ORDER — SODIUM CHLORIDE 0.9 % IV SOLN
2.0000 g | Freq: Two times a day (BID) | INTRAVENOUS | Status: DC
  Administered 2022-11-28 (×2): 2 g via INTRAVENOUS
  Filled 2022-11-27 (×2): qty 12.5

## 2022-11-27 MED ORDER — INSULIN ASPART 100 UNIT/ML IJ SOLN
0.0000 [IU] | Freq: Every day | INTRAMUSCULAR | Status: DC
  Administered 2022-11-27: 5 [IU] via SUBCUTANEOUS
  Administered 2022-11-28: 2 [IU] via SUBCUTANEOUS
  Filled 2022-11-27 (×2): qty 1

## 2022-11-27 NOTE — Consult Note (Signed)
PHARMACY -  BRIEF ANTIBIOTIC NOTE   Pharmacy has received consult(s) for vancomycin and cefepime dosing from an ED provider.  The patient's profile has been reviewed for ht/wt/allergies/indication/available labs.    One time order(s) placed for vancomycin 2000 mg IV x 1 and cefepime 2 grams IV x 1  Further antibiotics/pharmacy consults should be ordered by admitting physician if indicated.                       Thank you, Barrie Folk, PharmD 11/27/2022  11:40 AM

## 2022-11-27 NOTE — ED Notes (Signed)
[facility] (care giver) [phone]

## 2022-11-27 NOTE — Consult Note (Signed)
PODIATRY / FOOT AND ANKLE SURGERY CONSULTATION NOTE  Requesting Physician: Dr. Cyril Loosen  Reason for consult: left forefoot gangrene   HPI: Corey Foster is a 67 y.o. male who presents with gangrene to the left hallux.  He was seen in outpatient clinic by Dr. Alberteen Spindle and was noted to have worsening changes to the left hallux.  He sent him to the ED for eval and vascular workup with likely amputation of the left great toe.  Patient currently denies n/v/f/c.  Patient notes that he does not walk much but does have calf pain with ambulation.  He notes no rest pain to his calves at this time.  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   Sleep apnea     Surgical Hx:  Past Surgical History:  Procedure Laterality Date   CYSTOSCOPY W/ RETROGRADES Bilateral 05/31/2019   Procedure: CYSTOSCOPY WITH RETROGRADE PYELOGRAM;  Surgeon: Riki Altes, MD;  Location: [facility] OR[facility];  Service: Urology;  Laterality: Bilateral;   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-C N/A 05/31/2019   Procedure: TRANSURETHRAL RESECTION OF BLADDER TUMOR WITH gemcitabine;  Surgeon: Riki Altes, MD;  Location: [facility] OR[facility];  Service: Urology;  Laterality: N/A;    FHx:  Family History  Problem Relation Age of Onset   Diabetes Mother    Heart disease Mother    Diabetes Father    Heart disease Father    Diabetes Brother     Social History:  reports that he has been smoking cigarettes. He has a 10 pack-year smoking history. He has never used smokeless tobacco. He reports current alcohol use of about 18.0 standard drinks of alcohol per week. He reports that he does not currently use drugs after having used the following drugs: Cocaine and Marijuana.  Allergies:  Allergies  Allergen  Reactions   Nsaids Other (See Comments)    AKI AKI     (Not in a hospital admission)   Physical Exam: General: Alert and oriented.  No apparent distress.  Vascular: DP/PT pulses nonpalpable bil, no hair growth noted to both feet.  Mild nonpitting BLE edema.  Both feet are somewhat cool to touch especially left hallux, capillary fill time absent left hallux with gangrenous changes  Neuro: Light touch sensation reduced to BLE.  Derm: Skin is thin and brittle to both feet with very and vascular atrophic looking appearance.  Left hallux appears to have gangrene present to the IPJ hallux dorsally and extending plantarly past the IPJ hallux.  Both feet appear to be very dirty.  MSK: 5/5 strength bilateral lower extremities.  Results for orders placed or performed during the hospital encounter of 11/27/22 (from the past 48 hour(s))  CBC with Differential     Status: Abnormal   Collection Time: 11/27/22 10:51 AM  Result Value Ref Range   WBC 7.9 4.0 - 10.5 K/uL   RBC 5.48 4.22 - 5.81 MIL/uL   Hemoglobin 17.4 (H) 13.0 - 17.0 g/dL   HCT 10.9 60.4 - 54.0 %   MCV 91.4 80.0 - 100.0 fL   MCH 31.8 26.0 - 34.0 pg   MCHC 34.7 30.0 - 36.0 g/dL   RDW 98.1 19.1 - 47.8 %   Platelets 229 150 - 400  K/uL   nRBC 0.0 0.0 - 0.2 %   Neutrophils Relative % 65 %   Neutro Abs 5.1 1.7 - 7.7 K/uL   Lymphocytes Relative 25 %   Lymphs Abs 2.0 0.7 - 4.0 K/uL   Monocytes Relative 9 %   Monocytes Absolute 0.7 0.1 - 1.0 K/uL   Eosinophils Relative 0 %   Eosinophils Absolute 0.0 0.0 - 0.5 K/uL   Basophils Relative 0 %   Basophils Absolute 0.0 0.0 - 0.1 K/uL   Immature Granulocytes 1 %   Abs Immature Granulocytes 0.06 0.00 - 0.07 K/uL    Comment: Performed at [facility], [address] [place], [place] [address]  Comprehensive metabolic panel     Status: Abnormal   Collection Time: 11/27/22 10:51 AM  Result Value Ref Range   Sodium 128 (L) 135 - 145 mmol/L   Potassium 4.7 3.5 - 5.1 mmol/L    Chloride 89 (L) 98 - 111 mmol/L   CO2 25 22 - 32 mmol/L   Glucose, Bld 470 (H) 70 - 99 mg/dL    Comment: Glucose reference range applies only to samples taken after fasting for at least 8 hours.   BUN 47 (H) 8 - 23 mg/dL   Creatinine, Ser 6.04 (H) 0.61 - 1.24 mg/dL   Calcium 8.9 8.9 - 54.0 mg/dL   Total Protein 7.9 6.5 - 8.1 g/dL   Albumin 3.2 (L) 3.5 - 5.0 g/dL   AST 12 (L) 15 - 41 U/L   ALT 13 0 - 44 U/L   Alkaline Phosphatase 80 38 - 126 U/L   Total Bilirubin 0.4 0.3 - 1.2 mg/dL   GFR, Estimated 35 (L) >60 mL/min    Comment: (NOTE) Calculated using the CKD-EPI Creatinine Equation (2021)    Anion gap 14 5 - 15    Comment: Performed at [facility], [address] [place], [place] [address]  Lactic acid, plasma     Status: Abnormal   Collection Time: 11/27/22 10:51 AM  Result Value Ref Range   Lactic Acid, Venous 2.0 (HH) 0.5 - 1.9 mmol/L    Comment: CRITICAL RESULT CALLED TO, READ BACK BY AND VERIFIED WITH BILLY MONTEE AT 1127 11/27/22 DAS Performed at [facility], [address] [place], [place] [address]   Lactic acid, plasma     Status: None   Collection Time: 11/27/22  1:49 PM  Result Value Ref Range   Lactic Acid, Venous 1.4 0.5 - 1.9 mmol/L    Comment: Performed at [facility], [address] [place], [place] [address]   DG Foot Complete Left  Result Date: 11/27/2022 CLINICAL DATA:  Left great toe gangrene. EXAM: LEFT FOOT - COMPLETE 3+ VIEW COMPARISON:  None Available. FINDINGS: Mild hallux valgus. Mildly decreased bone mineralization. Moderate great toe soft tissue swelling. No definite cortical erosion is seen. No subcutaneous air. Joint spaces are preserved. No acute fracture dislocation. High-grade atherosclerotic calcifications. IMPRESSION: Moderate great toe soft tissue swelling. No definite cortical erosion is seen. No subcutaneous air. Electronically Signed   By: Neita Garnet M.D.   On: 11/27/2022 13:31    Blood pressure 125/80,  pulse 89, temperature (!) 97.5 F (36.4 C), temperature source Oral, resp. rate 13, height 5\' 11"  (1.803 m), weight 112.9 kg, SpO2 99%.  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 -Both limbs appear to be fairly ischemic but  left has worse signs with left hallux dry gangrene at this point. -Vascular team has been consulted.  Planning for intervention tomorrow possibly. -Would likely allow the tissues to demarcate over the weekend before deciding on amputation of left hallux.  If blood flow is unimproved do not have high hopes that amputation will heal.  Discussed with patient in detail. -Appreciate medicine recommendations for antibiotic therapy.  Appears to be stable and dry gangrene at this time.  White blood cell count within normal limits. -Recommend Betadine paint to left hallux daily. -We will follow back up with patient after vascular intervention.  Corey Foster, DPM 11/27/2022, 2:35 PM

## 2022-11-27 NOTE — Telephone Encounter (Signed)
Received testosterone refill request.  He has not been seen since June 2023.  Needs a follow-up visit before refills and he is also past due for surveillance cystoscopy for bladder cancer.  He no-showed 08/20/2022 appointment.

## 2022-11-27 NOTE — ED Triage Notes (Signed)
Patient sent over from [facility]. Reports he has gangrene in left, great toe. History neuropathy. Concerned about clogged arteries in leg and possible amputation

## 2022-11-27 NOTE — Assessment & Plan Note (Addendum)
Blood cultures x 2 are in process Cefepime, vancomycin per pharmacy ordered Oxycodone 5 g p.o. every 6 hours as needed for moderate pain, 2 days ordered; morphine 4 mg IV every 4 hours as needed for severe pain, 20 hours of coverage ordered Symptomatic support: Acetaminophen 650 p.o./rectal every 6 hours as needed for mild pain, fever; oxycodone 5 mg p.o. every 6 hours as needed for moderate pain, 2 days ordered; morphine 4 mg IV every 4 hours as needed for severe pain, 20 hours of coverage ordered Aggressive NS fluid bolus ordered by EDP LR infusion at 150 mL/h, 1 day ordered

## 2022-11-27 NOTE — Assessment & Plan Note (Signed)
Nicotine patch/Nicorette gum as needed ordered

## 2022-11-27 NOTE — Assessment & Plan Note (Addendum)
Home simvastatin 40 mg daily resumed

## 2022-11-27 NOTE — Assessment & Plan Note (Signed)
At baseline 

## 2022-11-27 NOTE — ED Provider Notes (Signed)
[facility] Provider Note    Event Date/Time   First MD Initiated Contact with Patient 11/27/22 1053     (approximate)   History   Wound Infection   HPI  Corey Foster is a 67 y.o. male with a history of diabetes, hypertension, neuropathy who presents from podiatrist office with likely gangrene of the left great toe.  Patient does not have significant sensation in his feet.  Denies fevers.  Denies dizziness.     Physical Exam   Triage Vital Signs: ED Triage Vitals  Encounter Vitals Group     BP 11/27/22 1036 (!) 73/55     Systolic BP Percentile --      Diastolic BP Percentile --      Pulse Rate 11/27/22 1036 91     Resp 11/27/22 1036 18     Temp 11/27/22 1036 (!) 97.5 F (36.4 C)     Temp Source 11/27/22 1036 Oral     SpO2 11/27/22 1036 95 %     Weight 11/27/22 1038 112.9 kg (249 lb)     Height 11/27/22 1038 1.803 m (5\' 11" )     Head Circumference --      Peak Flow --      Pain Score 11/27/22 1037 7     Pain Loc --      Pain Education --      Exclude from Growth Chart --     Most recent vital signs: Vitals:   11/27/22 1036 11/27/22 1205  BP: (!) 73/55 108/67  Pulse: 91   Resp: 18   Temp: (!) 97.5 F (36.4 C)   SpO2: 95%      General: Awake, no distress.  CV:  Good peripheral perfusion.  Resp:  Normal effort.  Abd:  No distention.  Other:  Left great toe, gangrenous plantar surface, mild erythema of the foot as well, warm and well-perfused   ED Results / Procedures / Treatments   Labs (all labs ordered are listed, but only abnormal results are displayed) Labs Reviewed  CBC WITH DIFFERENTIAL/PLATELET - Abnormal; Notable for the following components:      Result Value   Hemoglobin 17.4 (*)    All other components within normal limits  COMPREHENSIVE METABOLIC PANEL - Abnormal; Notable for the following components:   Sodium 128 (*)    Chloride 89 (*)    Glucose, Bld 470 (*)    BUN 47 (*)    Creatinine, Ser 2.02 (*)     Albumin 3.2 (*)    AST 12 (*)    GFR, Estimated 35 (*)    All other components within normal limits  LACTIC ACID, PLASMA - Abnormal; Notable for the following components:   Lactic Acid, Venous 2.0 (*)    All other components within normal limits  CULTURE, BLOOD (ROUTINE X 2)  CULTURE, BLOOD (ROUTINE X 2)  LACTIC ACID, PLASMA     EKG     RADIOLOGY Foot x-ray viewed interpret by me, no evidence of osteomyelitis    PROCEDURES:  Critical Care performed: yes  CRITICAL CARE Performed by: Jene Every   Total critical care time: 30 minutes  Critical care time was exclusive of separately billable procedures and treating other patients.  Critical care was necessary to treat or prevent imminent or life-threatening deterioration.  Critical care was time spent personally by me on the following activities: development of treatment plan with patient and/or surrogate as well as nursing, discussions with consultants, evaluation of  patient's response to treatment, examination of patient, obtaining history from patient or surrogate, ordering and performing treatments and interventions, ordering and review of laboratory studies, ordering and review of radiographic studies, pulse oximetry and re-evaluation of patient's condition.   Procedures   MEDICATIONS ORDERED IN ED: Medications  ceFEPIme (MAXIPIME) 2 g in sodium chloride 0.9 % 100 mL IVPB (has no administration in time range)  metroNIDAZOLE (FLAGYL) IVPB 500 mg (has no administration in time range)  sodium chloride 0.9 % bolus 1,000 mL (has no administration in time range)    And  sodium chloride 0.9 % bolus 1,000 mL (has no administration in time range)    And  sodium chloride 0.9 % bolus 500 mL (has no administration in time range)  vancomycin (VANCOREADY) IVPB 2000 mg/400 mL (has no administration in time range)  0.9 %  sodium chloride infusion ( Intravenous New Bag/Given 11/27/22 1122)     IMPRESSION / MDM / ASSESSMENT  AND PLAN / ED COURSE  I reviewed the triage vital signs and the nursing notes. Patient's presentation is most consistent with acute presentation with potential threat to life or bodily function.  Patient with a history of diabetes, hypertension, neuropathy presents with gangrene of the left great toe.  Found to be hypotensive here although he states he feels well overall, is not complaining of any dizziness fevers or chills.  Pending lactic and white blood cell count.  At this time doubt sepsis, suspect dehydration, will give IV fluids  ----------------------------------------- 11:45 AM on 11/27/2022 ----------------------------------------- Lactic acid is elevated at 2.0.  Code sepsis activated, given initial hypotension will give additional fluid to complete 30 mL/kg for septic shock.  IV antibiotics ordered.   Blood pressure has improved with IV fluids  Consulted Dr. Excell Seltzer of Podiatry, he will see the patient.  Consult the hospitalist for admission   Sepsis - Repeat Assessment  Performed at:    12:09  Vitals     Blood pressure 108/67, pulse 91, temperature (!) 97.5 F (36.4 C), temperature source Oral, resp. rate 18, height 1.803 m (5\' 11" ), weight 112.9 kg, SpO2 95%.  Heart:     Regular rate and rhythm  Lungs:    CTA  Capillary Refill:   <2 sec  Peripheral Pulse:   Radial pulse palpable  Skin:     Normal Color             FINAL CLINICAL IMPRESSION(S) / ED DIAGNOSES   Final diagnoses:  Gangrene (HCC)  Sepsis, due to unspecified organism, unspecified whether acute organ dysfunction present [facility])     Rx / DC Orders   ED Discharge Orders     None        Note:  This document was prepared using Dragon voice recognition software and may include unintentional dictation errors.   Jene Every, MD 11/27/22 1210

## 2022-11-27 NOTE — Assessment & Plan Note (Signed)
Hyponatremia secondary to hyperosmolar hyperglycemia Serum corrected sodium level is 137, [place], 1999

## 2022-11-27 NOTE — Consult Note (Signed)
CODE SEPSIS - PHARMACY COMMUNICATION  **Broad Spectrum Antibiotics should be administered within 1 hour of Sepsis diagnosis**  Time Code Sepsis Called/Page Received: 1137  Antibiotics Ordered: cefepime, vancomycin, and Flagyl  Time of 1st antibiotic administration: 1210  Additional action taken by pharmacy: N/A  Barrie Folk ,PharmD Clinical Pharmacist  11/27/2022  11:41 AM

## 2022-11-27 NOTE — Assessment & Plan Note (Signed)
Scheduled antihypertensive medication not resumed on admission as patient had initial presentation of low normal tensive Hydralazine 5 mg IV every 6 hours as needed for SBP greater 170, 5 days ordered

## 2022-11-27 NOTE — H&P (Addendum)
History and Physical   Corey Foster LKG:[id number] DOB: 09-19-1955 DOA: 11/27/2022  PCP: Emogene Morgan, MD  Outpatient Specialists: Dr. Lonna Cobb, urology Patient coming from: home  I have personally briefly reviewed patient's old medical records in [facility] EMR.  Chief Concern: left foot gangrene  HPI: [name] is a 67 year old male with history of hypertension, obesity, insulin-dependent diabetes mellitus, depression, anxiety, erectile dysfunction, insomnia, who presents emergency department for chief concerns of left foot gangrene.  Vitals in the ED showed temperature of 97.5, respiration rate 18, heart rate 91, blood pressure 73/55, improved to 108/67, SpO2 95% on room air.  Serum sodium is 128, potassium 4.7, chloride 89, bicarb 25, BUN of 47, serum creatinine of 2.07, eGFR 35, nonfasting blood glucose 470, WBC 7.9, hemoglobin 17.4, platelets of 229.  Lactic acid is 2.0. Blood cultures x 2 have been collected and are in process.  ED treatment: Cefepime, metronidazole, vancomycin, sodium chloride 2.5 L bolus, sodium chloride infusion ---------------------------------- At bedside, patient is able to tell me his name, age, location, current calendar year.  Patient reports that he does not know how long his left first toe has been black.  He reports that he just noticed it yesterday.  He reports he tried to make an appointment to see Dr. Today and was told to come to the emergency department.  She denies fever, nausea, vomiting, chest pain, shortness of breath, abdominal pain, dysuria, hematuria, diarrhea.  He endorses chills.  Social history: He lives at home.  ROS: Constitutional: no weight change, no fever, + chills ENT/Mouth: no sore throat, no rhinorrhea Eyes: no eye pain, no vision changes Cardiovascular: no chest pain, no dyspnea,  no edema, no palpitations Respiratory: no cough, no sputum, no wheezing Gastrointestinal: no nausea, no vomiting, no diarrhea, no  constipation Genitourinary: no urinary incontinence, no dysuria, no hematuria Musculoskeletal: no arthralgias, no myalgias Skin: + skin lesions, + first left toe black Neuro: + weakness, no loss of consciousness, no syncope Psych: no anxiety, no depression, no decrease appetite Heme/Lymph: no bruising, no bleeding  ED Course: Discussed with emergency medicine provider, patient requiring hospitalization for chief concerns of left foot gangrene.  Assessment/Plan  Principal Problem:   Foot osteomyelitis, left (HCC) Active Problems:   Type II diabetes mellitus with renal manifestations (HCC)   Secondary erythrocytosis   Essential hypertension   Benign prostatic hyperplasia with urinary obstruction   ED (erectile dysfunction) of organic origin   Testicular hypofunction   Smoking   HLD (hyperlipidemia)   Anxiety   Hyperglycemia   Hyperosmolar hyponatremia   CKD stage 3b, GFR 30-44 ml/min (HCC)   Assessment and Plan:  * Foot osteomyelitis, left (HCC) Blood cultures x 2 are in process Cefepime, vancomycin per pharmacy ordered Oxycodone 5 g p.o. every 6 hours as needed for moderate pain, 2 days ordered; morphine 4 mg IV every 4 hours as needed for severe pain, 20 hours of coverage ordered Symptomatic support: Acetaminophen 650 p.o./rectal every 6 hours as needed for mild pain, fever; oxycodone 5 mg p.o. every 6 hours as needed for moderate pain, 2 days ordered; morphine 4 mg IV every 4 hours as needed for severe pain, 20 hours of coverage ordered Aggressive NS fluid bolus ordered by EDP LR infusion at 150 mL/h, 1 day ordered  Type II diabetes mellitus with renal manifestations (HCC) Insulin-dependent diabetes mellitus type 2 Home metformin, Jardiance, Trulicity were not resumed on admission Long-acting insulin 80 units in the a.m. and 120 units at  bedtime  Semglee 60 units nightly one-time dose ordered; AM team to resume home long-acting insulin when the benefits outweigh the  risk Insulin SSI with agents coverage ordered Goal inpatient blood glucose levels 140-180  CKD stage 3b, GFR 30-44 ml/min (HCC) At baseline  Hyperosmolar hyponatremia Hyponatremia secondary to hyperosmolar hyperglycemia Serum corrected sodium level is 137, [place], 1999  Anxiety PDMP reviewed Home alprazolam 1 mg p.o. 4 times daily as needed for anxiety resumed  HLD (hyperlipidemia) Home simvastatin 40 mg daily resumed  Smoking Nicotine patch/Nicorette gum as needed ordered  Essential hypertension Scheduled antihypertensive medication not resumed on admission as patient had initial presentation of low normal tensive Hydralazine 5 mg IV every 6 hours as needed for SBP greater 170, 5 days ordered  Chart reviewed.   DVT prophylaxis: ted hose; AM to initiate pharmacologic DVT prophylaxis when the benefits outweigh the risk. Code Status: full code  Diet: Heart healthy/carb modified Family Communication: A phone call was offered, patient declined stating his family already knows about him being admitted to the hospital. Disposition Plan: Pending podiatry and vascular surgery specialists evaluation Consults called: EDP consulted podiatry, Dr. Excell Seltzer and Dr. Excell Seltzer has consulted vascular surgery, Dr. Wyn Quaker Admission status: Telemetry cardiac, inpatient  Past Medical History:  Diagnosis Date   Arthritis    Diabetes mellitus without complication (HCC)    DJD (degenerative joint disease)    GERD (gastroesophageal reflux disease)    Hyperlipidemia    Hypertension    Sciatic nerve pain    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   Sleep apnea    Past Surgical History:  Procedure Laterality Date   CYSTOSCOPY W/ RETROGRADES Bilateral 05/31/2019   Procedure: CYSTOSCOPY WITH RETROGRADE PYELOGRAM;  Surgeon: Riki Altes, MD;  Location: [facility] ORS;  Service: Urology;  Laterality: Bilateral;   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-C N/A 05/31/2019   Procedure: TRANSURETHRAL  RESECTION OF BLADDER TUMOR WITH gemcitabine;  Surgeon: Riki Altes, MD;  Location: [facility] ORS;  Service: Urology;  Laterality: N/A;   Social History:  reports that he has been smoking cigarettes. He has a 10 pack-year smoking history. He has never used smokeless tobacco. He reports current alcohol use of about 18.0 standard drinks of alcohol per week. He reports that he does not currently use drugs after having used the following drugs: Cocaine and Marijuana.  Allergies  Allergen Reactions   Nsaids Other (See Comments)    AKI AKI    Family History  Problem Relation Age of Onset   Diabetes Mother    Heart disease Mother    Diabetes Father    Heart disease Father    Diabetes Brother    Family history: Family history reviewed and not pertinent.  Prior to Admission medications   Medication Sig Start Date End Date Taking? Authorizing Provider  NOVOLOG FLEXPEN 100 UNIT/ML FlexPen Inject 100 Units into the skin 3 (three) times daily with meals. 11/13/22  Yes [provider]  albuterol (VENTOLIN HFA) 108 (90 Base) MCG/ACT inhaler Inhale 1 puff into the lungs every 6 (six) hours as needed for wheezing or shortness of breath.  02/15/13   [provider]  ALPRAZolam Prudy Feeler) 1 MG tablet Take 1 mg by mouth 4 (four) times daily as needed. 08/27/19   [provider]  AMBULATORY NON FORMULARY MEDICATION Trimix (30/1/10)-(Pap/Phent/PGE)  Dosage: Inject 0.3 cc and may increase 0.1cc to achieve and erection lasting no longer than 1 hour per injection   Vial 1ml  Qty #5 refills 2  [facility] [phone] Fax [phone] 02/13/22   Riki Altes, MD  amLODipine (NORVASC) 10 MG tablet Take 10 mg by mouth daily. 03/16/17   [provider]  aspirin EC 325 MG tablet Take 325 mg by mouth daily.    [provider]  baclofen (LIORESAL) 10 MG tablet Take 10 mg by mouth 2 (two) times daily.  10/07/13   [provider]  cholecalciferol  (VITAMIN D) 400 UNITS TABS tablet Take 10,000 Units by mouth daily.     [provider]  furosemide (LASIX) 20 MG tablet Take 20 mg by mouth daily. 01/15/21   [provider]  insulin glargine (LANTUS) 100 UNIT/ML injection Inject 80 Units into the skin daily. 80 units in the am and 120 units at bedtime 01/14/12   [provider]  insulin lispro (HUMALOG) 100 UNIT/ML KiwkPen 40 Units.  10/15/17   [provider]  insulin lispro (HUMALOG) 100 UNIT/ML KwikPen Inject into the skin.    [provider]  Insulin Pen Needle 31G X 5 MM MISC  04/28/12   [provider]  JARDIANCE 25 MG TABS tablet Take 25 mg by mouth daily. 06/21/21   [provider]  Lancet Devices [facility] DELICA PLUS LANCING) MISC Use 1 kit as directed three times a day  for blood glucose monitoring 03/14/21   [provider]  Lancets (ONETOUCH DELICA PLUS [employer]) MISC Apply topically. 03/15/21   [provider]  losartan (COZAAR) 100 MG tablet Take 100 mg by mouth daily.  09/25/17   [provider]  metFORMIN (GLUCOPHAGE) 1000 MG tablet Take 1,000 mg by mouth 2 (two) times daily. 06/17/21   [provider]  naloxone Jonelle [employer]) 0.4 MG/ML injection Inject as directed as directed    [provider]  Nutritional Supplements (KETO PO) Take 1 capsule by mouth.    [provider]  Omega-3 Fatty Acids (FISH OIL) 1000 MG CAPS Take 1 capsule by mouth daily.  01/14/12   [provider]  ONE TOUCH ULTRA TEST test strip  10/12/17   [provider]  Oxycodone HCl 20 MG TABS Take 20 mg by mouth 4 (four) times daily as needed (pain).  06/20/15   [provider]  sildenafil (REVATIO) 20 MG tablet TAKE 2 TO 5 TABLETS BY MOUTH 1 HOUR PRIOR TO INTERCOURSE 10/09/22   Stoioff, Verna Czech, MD  simvastatin (ZOCOR) 40 MG tablet Take 40 mg by mouth daily. 07/07/16   [provider]  Testosterone 20.25 MG/ACT (1.62%) GEL APPLY  4 APPLICATIONS TOPICALLY DAILY. 05/12/22   Stoioff, Verna Czech, MD  TOUJEO MAX SOLOSTAR 300 UNIT/ML Solostar Pen Inject 80-120 Units into the skin in the morning and at bedtime. Inject 80 Units into the skin daily. 80 units in the am and 120 units at bedtime    [provider]  TRULICITY 1.5 MG/0.5ML SOPN Inject 1.5 mg into the skin once a week. 01/16/21   [provider]  vitamin C (ASCORBIC ACID) 250 MG tablet Take 250 mg by mouth daily.    [provider]  zolpidem (AMBIEN) 10 MG tablet Take 10 mg by mouth at bedtime as needed. 04/18/19   [provider]   Physical Exam: Vitals:   11/27/22 1038 11/27/22 1115 11/27/22 1205 11/27/22 1230  BP:  (!) 81/54 108/67 113/68  Pulse:    88  Resp:  20  17  Temp:      TempSrc:      SpO2:  98%  93%  Weight: 112.9 kg     Height: 5\' 11"  (1.803 m)      Constitutional: appears older than chronological age, chronically ill, poor hygiene, calm Eyes: PERRL, lids and conjunctivae normal ENMT: Mucous membranes are dry. Posterior pharynx clear of any exudate or lesions.  Poor dentition. Hearing appropriate. Neck: normal, supple, no masses, no thyromegaly Respiratory: clear to auscultation bilaterally, no wheezing, no crackles. Normal respiratory effort. No accessory muscle use.  Cardiovascular: Regular rate and rhythm, no murmurs / rubs / gallops. No extremity edema.  Decreases in palpable pedal pulses bilaterally. No carotid bruits.  Abdomen: Obese abdomen, no tenderness, no masses palpated, no hepatosplenomegaly. Bowel sounds positive.  Musculoskeletal: no clubbing / cyanosis. No joint deformity upper and lower extremities. Good ROM, no contractures, no atrophy. Normal muscle tone.  Skin:         Neurologic: Decreased sensation of the left foot. Strength 5/5 in all 4.  Psychiatric: Unable to assess judgment and insight. Alert and oriented x 3.  Agitated mood.   EKG: Not indicated at this time  X-ray on Admission: I  personally reviewed and I agree with radiologist reading as below.  DG Foot Complete Left  Result Date: 11/27/2022 CLINICAL DATA:  Left great toe gangrene. EXAM: LEFT FOOT - COMPLETE 3+ VIEW COMPARISON:  None Available. FINDINGS: Mild hallux valgus. Mildly decreased bone mineralization. Moderate great toe soft tissue swelling. No definite cortical erosion is seen. No subcutaneous air. Joint spaces are preserved. No acute fracture dislocation. High-grade atherosclerotic calcifications. IMPRESSION: Moderate great toe soft tissue swelling. No definite cortical erosion is seen. No subcutaneous air. Electronically Signed   By: Neita Garnet M.D.   On: 11/27/2022 13:31    Labs on Admission: I have personally reviewed following labs  CBC: Recent Labs  Lab 11/27/22 1051  WBC 7.9  NEUTROABS 5.1  HGB 17.4*  HCT 50.1  MCV 91.4  PLT 229   Basic Metabolic Panel: Recent Labs  Lab 11/27/22 1051  NA 128*  K 4.7  CL 89*  CO2 25  GLUCOSE 470*  BUN 47*  CREATININE 2.02*  CALCIUM 8.9   GFR: Estimated Creatinine Clearance: 45.3 mL/min (A) (by C-G formula based on SCr of 2.02 mg/dL (H)).  Liver Function Tests: Recent Labs  Lab 11/27/22 1051  AST 12*  ALT 13  ALKPHOS 80  BILITOT 0.4  PROT 7.9  ALBUMIN 3.2*   Urine analysis:    Component Value Date/Time   COLORURINE YELLOW 02/07/2021 0908   APPEARANCEUR Clear 08/19/2021 1501   LABSPEC 1.015 02/07/2021 0908   PHURINE 5.5 02/07/2021 0908   GLUCOSEU 2+ (A) 08/19/2021 1501   HGBUR NEGATIVE 02/07/2021 0908   BILIRUBINUR Negative 08/19/2021 1501   KETONESUR 15 (A) 02/07/2021 0908   PROTEINUR Negative 08/19/2021 1501   PROTEINUR NEGATIVE 02/07/2021 0908   NITRITE Negative 08/19/2021 1501   NITRITE NEGATIVE 02/07/2021 0908   LEUKOCYTESUR Negative 08/19/2021 1501   LEUKOCYTESUR NEGATIVE 02/07/2021 0908   This document was prepared using Dragon Voice Recognition software and may include unintentional dictation errors.  Dr. Sedalia Muta [facility]  If 7PM-7AM, please contact overnight-coverage provider If 7AM-7PM, please contact day attending provider [email or web address]  11/27/2022, 1:48 PM

## 2022-11-27 NOTE — Telephone Encounter (Signed)
[facility] podiatry called wanting to get patient in and seen Urgent today or tomorrow. CMA calling stated that patient had gangrene foot, cold and black. After speaking with NP Sheppard Plumber she stated send patient to ER he will be seen quicker and will be able to accessed and taking care of

## 2022-11-27 NOTE — Consult Note (Signed)
Pharmacy Antibiotic Note  Corey Foster is a 67 y.o. male admitted on 11/27/2022 with gangrene of left toe.  Pharmacy has been consulted for vancomycin and cefepime dosing.  Possible AKI with Scr 2.02 (Unsure of baseline)  Plan: Give vancomycin 2500 mg IV x 1, followed by vancomycin 1250 mg IV every 24 hours Estimated AUC 430, Cmin 11.9 IBW, Scr 2.02, Vd 0.72 (BMI 34) Vancomycin levels as clinically indicated Start cefepime 2 grams IV every 12 hours Flagyl 500 mg IV every 12 hours per provider  Height: 5\' 11"  (180.3 cm) Weight: 112.9 kg (249 lb) IBW/kg (Calculated) : 75.3  Temp (24hrs), Avg:97.5 F (36.4 C), Min:97.5 F (36.4 C), Max:97.5 F (36.4 C)  Recent Labs  Lab 11/27/22 1051  WBC 7.9  CREATININE 2.02*  LATICACIDVEN 2.0*    Estimated Creatinine Clearance: 45.3 mL/min (A) (by C-G formula based on SCr of 2.02 mg/dL (H)).    Allergies  Allergen Reactions   Nsaids Other (See Comments)    AKI AKI     Antimicrobials this admission: Vancomycin 10/3 >> cefepime 10/3 >>  Flagyl 10/3>>  Dose adjustments this admission: N/A  Microbiology results: 10/3 BCx: pending  Thank you for allowing pharmacy to be a part of this patient's care.  Barrie Folk, PharmD 11/27/2022 12:44 PM

## 2022-11-27 NOTE — ED Notes (Addendum)
Pt. From ED room 8 to ED rm. 31. Transferred to hospital bed. NAD. Pt. Provided clean linens, and blankets, repositioned for comfort. Cont. Cardiac monitoring maintained, IV infusions verified. Pt. Is conversational with staff and pleasant. Pt. Is quick to negative thoughts, however is redirectable by staff. Pt. Provided cold, diet beverage per request.

## 2022-11-27 NOTE — Sepsis Progress Note (Signed)
Elink will follow per sepsis protocol  

## 2022-11-27 NOTE — Inpatient Diabetes Management (Signed)
Inpatient Diabetes Program Recommendations  AACE/ADA: New Consensus Statement on Inpatient Glycemic Control (2015)  Target Ranges:  Prepandial:   less than 140 mg/dL      Peak postprandial:   less than 180 mg/dL (1-2 hours)      Critically ill patients:  140 - 180 mg/dL   Lab Results  Component Value Date   GLUCAP 143 (H) 02/08/2021    Review of Glycemic Control  Latest Reference Range & Units 11/27/22 10:51  Glucose 70 - 99 mg/dL 540 (H)  (H): Data is abnormally high  Diabetes history: DM2  Outpatient Diabetes medications:  Lantus 80 qam, 120 at bedtime Humalog 40 TID Jardiance 25 Metformin 1000 BID,  Trulicity 1.5 mg weekly  Current orders for Inpatient glycemic control:  Novolog 0-15 units TID and 0-5 units QHS  Inpatient Diabetes Program Recommendations:    Patient is on large doses of insulin at home  Please consider Semglee 30 units BID Novolog 10 units TID with meals if he consumes at least 50%  Will continue to follow while inpatient.  Thank you, Dulce Sellar, MSN, CDCES Diabetes Coordinator Inpatient Diabetes Program [phone] (team pager from 8a-5p)

## 2022-11-27 NOTE — ED Notes (Signed)
Patient eating lunch tray and given urinal as requested.

## 2022-11-27 NOTE — Assessment & Plan Note (Addendum)
PDMP reviewed Home alprazolam 1 mg p.o. 4 times daily as needed for anxiety resumed

## 2022-11-27 NOTE — ED Notes (Signed)
Provided diet ginger ale and crackers

## 2022-11-27 NOTE — Hospital Course (Addendum)
Corey Foster is a 67 year old male with history of hypertension, obesity, insulin-dependent diabetes mellitus, depression, anxiety, erectile dysfunction, insomnia, who presents emergency department for chief concerns of left foot gangrene.  Patient is treated with broad-spectrum antibiotics.  Patient was seen by vascular surgery, angioplasty was performed on 10/4. Patient had a great toe amputation on 10/5, no evidence of infection.  Antibiotics discontinued per recommendation from podiatry.  At this point, patient medically stable to be discharged.  Patient also was placed on reduced dose of Lantus, but still developed hypoglycemia due to n.p.o. status in the morning.  I will reduce the Lantus dose to 60 units twice a day at discharge.

## 2022-11-27 NOTE — Assessment & Plan Note (Addendum)
Insulin-dependent diabetes mellitus type 2 Home metformin, Jardiance, Trulicity were not resumed on admission Long-acting insulin 80 units in the a.m. and 120 units at bedtime  Semglee 60 units nightly one-time dose ordered; AM team to resume home long-acting insulin when the benefits outweigh the risk Insulin SSI with agents coverage ordered Goal inpatient blood glucose levels 140-180

## 2022-11-27 NOTE — ED Notes (Signed)
Patient given warm blanket and TV remote at this time. No other needs expressed.

## 2022-11-28 ENCOUNTER — Encounter
Admission: EM | Disposition: A | Discharge status: Home / Self Care | Payer: Self-pay | Source: Home / Self Care | Attending: Internal Medicine

## 2022-11-28 DIAGNOSIS — I96 Gangrene, not elsewhere classified: Principal | ICD-10-CM

## 2022-11-28 DIAGNOSIS — N1832 Chronic kidney disease, stage 3b: Secondary | ICD-10-CM

## 2022-11-28 DIAGNOSIS — Z794 Long term (current) use of insulin: Secondary | ICD-10-CM | POA: Diagnosis not present

## 2022-11-28 DIAGNOSIS — I70262 Atherosclerosis of native arteries of extremities with gangrene, left leg: Secondary | ICD-10-CM | POA: Diagnosis not present

## 2022-11-28 DIAGNOSIS — E1122 Type 2 diabetes mellitus with diabetic chronic kidney disease: Secondary | ICD-10-CM | POA: Diagnosis not present

## 2022-11-28 DIAGNOSIS — M869 Osteomyelitis, unspecified: Secondary | ICD-10-CM | POA: Diagnosis not present

## 2022-11-28 HISTORY — PX: LOWER EXTREMITY ANGIOGRAPHY: CATH118251

## 2022-11-28 LAB — BASIC METABOLIC PANEL
Anion gap: 7 (ref 5–15)
BUN: 28 mg/dL — ABNORMAL HIGH (ref 8–23)
CO2: 23 mmol/L (ref 22–32)
Calcium: 8.1 mg/dL — ABNORMAL LOW (ref 8.9–10.3)
Chloride: 103 mmol/L (ref 98–111)
Creatinine, Ser: 1.42 mg/dL — ABNORMAL HIGH (ref 0.61–1.24)
GFR, Estimated: 54 mL/min — ABNORMAL LOW (ref >60)
Glucose, Bld: 327 mg/dL — ABNORMAL HIGH (ref 70–99)
Potassium: 4.2 mmol/L (ref 3.5–5.1)
Sodium: 133 mmol/L — ABNORMAL LOW (ref 135–145)

## 2022-11-28 LAB — CBC
HCT: 40.7 % (ref 39.0–52.0)
Hemoglobin: 14.3 g/dL (ref 13.0–17.0)
MCH: 31.6 pg (ref 26.0–34.0)
MCHC: 35.1 g/dL (ref 30.0–36.0)
MCV: 90 fL (ref 80.0–100.0)
Platelets: 177 10*3/uL (ref 150–400)
RBC: 4.52 MIL/uL (ref 4.22–5.81)
RDW: 12.7 % (ref 11.5–15.5)
WBC: 6.2 10*3/uL (ref 4.0–10.5)
nRBC: 0 % (ref 0.0–0.2)

## 2022-11-28 LAB — GLUCOSE, CAPILLARY
Glucose-Capillary: 235 mg/dL — ABNORMAL HIGH (ref 70–99)
Glucose-Capillary: 166 mg/dL — ABNORMAL HIGH (ref 70–99)
Glucose-Capillary: 125 mg/dL — ABNORMAL HIGH (ref 70–99)
Glucose-Capillary: 216 mg/dL — ABNORMAL HIGH (ref 70–99)
Glucose-Capillary: 214 mg/dL — ABNORMAL HIGH (ref 70–99)

## 2022-11-28 SURGERY — LOWER EXTREMITY ANGIOGRAPHY
Anesthesia: Moderate Sedation | Laterality: Left

## 2022-11-28 MED ORDER — FENTANYL CITRATE (PF) 100 MCG/2ML IJ SOLN
INTRAMUSCULAR | Status: DC | PRN
  Administered 2022-11-28: 25 ug via INTRAVENOUS
  Administered 2022-11-28: 50 ug via INTRAVENOUS
  Administered 2022-11-28: 25 ug via INTRAVENOUS

## 2022-11-28 MED ORDER — MIDAZOLAM HCL 2 MG/ML PO SYRP: 8.0000 mg | ORAL_SOLUTION | Freq: Once | ORAL | Status: DC | PRN

## 2022-11-28 MED ORDER — HYDROMORPHONE HCL 1 MG/ML IJ SOLN: 1.0000 mg | Freq: Once | INTRAMUSCULAR | Status: DC | PRN

## 2022-11-28 MED ORDER — MIDAZOLAM HCL 2 MG/2ML IJ SOLN
INTRAMUSCULAR | Status: DC | PRN
  Administered 2022-11-28: 1 mg via INTRAVENOUS
  Administered 2022-11-28: 2 mg via INTRAVENOUS
  Administered 2022-11-28: 1 mg via INTRAVENOUS

## 2022-11-28 MED ORDER — INSULIN GLARGINE-YFGN 100 UNIT/ML ~~LOC~~ SOLN
60.0000 [IU] | Freq: Two times a day (BID) | SUBCUTANEOUS | Status: DC
  Administered 2022-11-28: 60 [IU] via SUBCUTANEOUS
  Administered 2022-11-29: 20 [IU] via SUBCUTANEOUS
  Filled 2022-11-28 (×4): qty 0.6

## 2022-11-28 MED ORDER — IODIXANOL 320 MG/ML IV SOLN
INTRAVENOUS | Status: DC | PRN
  Administered 2022-11-28: 45 mL via INTRA_ARTERIAL

## 2022-11-28 MED ORDER — LIDOCAINE-EPINEPHRINE (PF) 1 %-1:200000 IJ SOLN
INTRAMUSCULAR | Status: DC | PRN
  Administered 2022-11-28: 10 mL

## 2022-11-28 MED ORDER — MIDAZOLAM HCL 5 MG/5ML IJ SOLN
INTRAMUSCULAR | Status: AC
  Filled 2022-11-28: qty 5

## 2022-11-28 MED ORDER — FENTANYL CITRATE (PF) 100 MCG/2ML IJ SOLN
INTRAMUSCULAR | Status: AC
  Filled 2022-11-28: qty 2

## 2022-11-28 MED ORDER — FAMOTIDINE 20 MG PO TABS: 40.0000 mg | ORAL_TABLET | Freq: Once | ORAL | Status: DC | PRN

## 2022-11-28 MED ORDER — SODIUM CHLORIDE 0.9 % IV SOLN: INTRAVENOUS | Status: DC

## 2022-11-28 MED ORDER — METHYLPREDNISOLONE SODIUM SUCC 125 MG IJ SOLR: 125.0000 mg | Freq: Once | INTRAMUSCULAR | Status: DC | PRN

## 2022-11-28 MED ORDER — CEFAZOLIN SODIUM-DEXTROSE 2-4 GM/100ML-% IV SOLN
INTRAVENOUS | Status: AC
  Filled 2022-11-28: qty 100

## 2022-11-28 MED ORDER — HEPARIN SODIUM (PORCINE) 1000 UNIT/ML IJ SOLN
INTRAMUSCULAR | Status: DC | PRN
  Administered 2022-11-28: 5000 [IU] via INTRAVENOUS

## 2022-11-28 MED ORDER — HEPARIN (PORCINE) IN NACL 2000-0.9 UNIT/L-% IV SOLN
INTRAVENOUS | Status: DC | PRN
  Administered 2022-11-28: 1000 mL

## 2022-11-28 MED ORDER — CEFAZOLIN SODIUM-DEXTROSE 2-4 GM/100ML-% IV SOLN: 2.0000 g | INTRAVENOUS | Status: DC

## 2022-11-28 MED ORDER — FENTANYL CITRATE PF 50 MCG/ML IJ SOSY: 12.5000 ug | PREFILLED_SYRINGE | Freq: Once | INTRAMUSCULAR | Status: DC | PRN

## 2022-11-28 MED ORDER — DIPHENHYDRAMINE HCL 50 MG/ML IJ SOLN: 50.0000 mg | Freq: Once | INTRAMUSCULAR | Status: DC | PRN

## 2022-11-28 MED ORDER — POVIDONE-IODINE 10 % EX SWAB
2.0000 | Freq: Once | CUTANEOUS | Status: AC
  Administered 2022-11-28: 2 via TOPICAL

## 2022-11-28 MED ORDER — HEPARIN SODIUM (PORCINE) 1000 UNIT/ML IJ SOLN
INTRAMUSCULAR | Status: AC
  Filled 2022-11-28: qty 10

## 2022-11-28 MED ORDER — ONDANSETRON HCL 4 MG/2ML IJ SOLN: 4.0000 mg | Freq: Four times a day (QID) | INTRAMUSCULAR | Status: DC | PRN

## 2022-11-28 MED ORDER — SODIUM CHLORIDE 0.9 % IV SOLN
2.0000 g | Freq: Three times a day (TID) | INTRAVENOUS | Status: DC
  Administered 2022-11-28 – 2022-11-29 (×2): 2 g via INTRAVENOUS
  Filled 2022-11-28 (×3): qty 12.5

## 2022-11-28 MED ORDER — CEFAZOLIN SODIUM-DEXTROSE 2-4 GM/100ML-% IV SOLN
2.0000 g | INTRAVENOUS | Status: AC
  Administered 2022-11-29: 2 g via INTRAVENOUS
  Filled 2022-11-28: qty 100

## 2022-11-28 SURGICAL SUPPLY — 18 items
BALLN ULTRVRSE 2X100X150 (BALLOONS) ×1
BALLN ULTRVRSE 3X80X150 (BALLOONS) ×1
BALLN ULTRVRSE 3X80X150 OTW (BALLOONS) ×1
BALLOON ULTRVRSE 2X100X150 (BALLOONS) IMPLANT
BALLOON ULTRVRSE 3X80X150 OTW (BALLOONS) IMPLANT
CATH ANGIO 5F PIGTAIL 65CM (CATHETERS) IMPLANT
CATH NAVICROSS ANGLED 135CM (MICROCATHETER) IMPLANT
COVER PROBE ULTRASOUND 5X96 (MISCELLANEOUS) IMPLANT
DEVICE STARCLOSE SE CLOSURE (Vascular Products) IMPLANT
GLIDEWIRE ADV .035X260CM (WIRE) IMPLANT
KIT ENCORE 26 ADVANTAGE (KITS) IMPLANT
PACK ANGIOGRAPHY (CUSTOM PROCEDURE TRAY) ×2 IMPLANT
SHEATH BRITE TIP 5FRX11 (SHEATH) IMPLANT
SHEATH RAABE 6FRX70 (SHEATH) IMPLANT
SYR MEDRAD MARK 7 150ML (SYRINGE) IMPLANT
TUBING CONTRAST HIGH PRESS 72 (TUBING) IMPLANT
WIRE G V18X300CM (WIRE) IMPLANT
WIRE GUIDERIGHT .035X150 (WIRE) IMPLANT

## 2022-11-28 NOTE — Op Note (Signed)
[facility]  Percutaneous Study/Intervention Procedural Note   Date of Surgery: 11/28/2022  Surgeon(s):Kashon Kraynak    Assistants:none  Pre-operative Diagnosis: PAD with gangrene left foot  Post-operative diagnosis:  Same  Procedure(s) Performed:             1.  Ultrasound guidance for vascular access right femoral artery             2.  Catheter placement into left SFA from right femoral approach             3.  Aortogram and selective left lower extremity angiogram             4.  Percutaneous transluminal angioplasty of left posterior tibial artery distally with a 2 mm balloon and proximally with a 3 mm balloon             5.  StarClose closure device right femoral artery  EBL: 10 cc  Contrast: 45 cc  Fluoro Time: 3.7 minutes  Moderate Conscious Sedation Time: approximately 31 minutes using 4 mg of Versed and 100 mcg of Fentanyl              Indications:  Patient is a 66 y.o.male with a gangrenous left great toe.The patient is brought in for angiography for further evaluation and potential treatment.  Due to the limb threatening nature of the situation, angiogram was performed for attempted limb salvage. The patient is aware that if the procedure fails, amputation would be expected.  The patient also understands that even with successful revascularization, amputation may still be required due to the severity of the situation.  Risks and benefits are discussed and informed consent is obtained.   Procedure:  The patient was identified and appropriate procedural time out was performed.  The patient was then placed supine on the table and prepped and draped in the usual sterile fashion. Moderate conscious sedation was administered during a face to face encounter with the patient throughout the procedure with my supervision of the RN administering medicines and monitoring the patient's vital signs, pulse oximetry, telemetry and mental status throughout from the start  of the procedure until the patient was taken to the recovery room. Ultrasound was used to evaluate the right common femoral artery.  It was patent .  A digital ultrasound image was acquired.  A Seldinger needle was used to access the right common femoral artery under direct ultrasound guidance and a permanent image was performed.  A 0.035 J wire was advanced without resistance and a 5Fr sheath was placed.  Pigtail catheter was placed into the aorta and an AP aortogram was performed. This demonstrated normal renal arteries and normal aorta and iliac segments without significant stenosis. I then crossed the aortic bifurcation and advanced to the left femoral head and then into the proximal to mid left SFA to opacify distally. Selective left lower extremity angiogram was then performed. This demonstrated normal common femoral artery, profunda femoris artery, superficial femoral and popliteal artery.  At the tibial trifurcation, there was a small anterior tibial artery that came off first and was occluded and did not reconstitute until the foot.  The peroneal artery was patent and provided decent runoff distally.  The posterior tibial artery had a high-grade stenosis proximally just beyond the origin in the 80 to 90% and was then continuous down to the distal posterior tibial artery at and just above the ankle where there was about an 80 to 85% stenosis.  It then filled  the foot and was the dominant runoff into the foot. It was felt that it was in the patient's best interest to proceed with intervention after these images to avoid a second procedure and a larger amount of contrast and fluoroscopy based off of the findings from the initial angiogram. The patient was systemically heparinized and a 6 French 70 cm Ansell sheath was then placed over the [employer] wire. I then used a Kumpe catheter and the advantage wire to get down into the proximal posterior tibial artery then exchanged for a V18 wire that crossed  both stenoses in the posterior tibial artery and parked the wire in the foot.  I then proceeded with intervention.  A 2 mm diameter by 10 cm length angioplasty balloon was inflated to 12 atm for 1 minute at the ankle.  A 3 mm diameter by 10 cm length angioplasty balloon was inflated to 8 atm for 1 minute in the proximal posterior tibial artery.  Completion imaging showed persistent greater than 50% stenosis in the proximal posterior tibial artery but only about a 20% residual stenosis in the distal posterior tibial artery lesion.  I then replaced the 3 mm balloon and inflated this up to 8 atm for 2 to 3 minutes in the proximal left posterior tibial artery.  Completion imaging showed significant improvement following this with only about a 20 to 25% residual stenosis in the proximal posterior tibial artery. I elected to terminate the procedure. The sheath was removed and StarClose closure device was deployed in the right femoral artery with excellent hemostatic result. The patient was taken to the recovery room in stable condition having tolerated the procedure well.  Findings:               Aortogram:  This demonstrated normal renal arteries and normal aorta and iliac segments without significant stenosis.             Left Lower Extremity:  This demonstrated normal common femoral artery, profunda femoris artery, superficial femoral and popliteal artery.  At the tibial trifurcation, there was a small anterior tibial artery that came off first and was occluded and did not reconstitute until the foot.  The peroneal artery was patent and provided decent runoff distally.  The posterior tibial artery had a high-grade stenosis proximally just beyond the origin in the 80 to 90% and was then continuous down to the distal posterior tibial artery at and just above the ankle where there was about an 80 to 85% stenosis.  It then filled the foot and was the dominant runoff into the foot.   Disposition: Patient was taken to  the recovery room in stable condition having tolerated the procedure well.  Complications: None  Corey Foster 11/28/2022 11:14 AM   This note was created with Dragon Medical transcription system. Any errors in dictation are purely unintentional.

## 2022-11-28 NOTE — Interval H&P Note (Signed)
History and Physical Interval Note:  11/28/2022 8:47 AM  Corey Foster  has presented today for surgery, with the diagnosis of gangrene left foot.  The various methods of treatment have been discussed with the patient and family. After consideration of risks, benefits and other options for treatment, the patient has consented to  Procedure(s): Lower Extremity Angiography (Left) as a surgical intervention.  The patient's history has been reviewed, patient examined, no change in status, stable for surgery.  I have reviewed the patient's chart and labs.  Questions were answered to the patient's satisfaction.     [name]

## 2022-11-28 NOTE — Consult Note (Signed)
Hospital Consult    Reason for Consult:  Left Lower extremity Gangrene Requesting Physician:  Dr. Londell Moh MD  MRN #:  [id number]  History of Present Illness: This is a 67 y.o. male with history of hypertension, obesity, insulin-dependent diabetes mellitus, depression, anxiety, erectile dysfunction, insomnia, who presents emergency department for chief concerns of left foot gangrene.   On exam patient appears to have vascular insufficiency to his left lower extremity. Vascular Surgery consulted to evaluate.   Past Medical History:  Diagnosis Date   Arthritis    Diabetes mellitus without complication (HCC)    DJD (degenerative joint disease)    GERD (gastroesophageal reflux disease)    Hyperlipidemia    Hypertension    Sciatic nerve pain    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   Sleep apnea     Past Surgical History:  Procedure Laterality Date   CYSTOSCOPY W/ RETROGRADES Bilateral 05/31/2019   Procedure: CYSTOSCOPY WITH RETROGRADE PYELOGRAM;  Surgeon: Riki Altes, MD;  Location: [facility] ORS;  Service: Urology;  Laterality: Bilateral;   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 Authorizing Provider  albuterol (VENTOLIN HFA) 108 (90 Base) MCG/ACT inhaler Inhale 1 puff into the lungs every 6 (six) hours as needed for wheezing or shortness of breath.  02/15/13  Yes [provider]  ALPRAZolam Prudy Feeler) 1 MG tablet Take 1 mg by mouth 4 (four) times daily as needed. 08/27/19  Yes [provider]  amLODipine (NORVASC) 10 MG tablet Take 10 mg by mouth daily. 03/16/17  Yes [provider]  aspirin EC 325 MG tablet Take 325  mg by mouth daily.   Yes [provider]  baclofen (LIORESAL) 10 MG tablet Take 10 mg by mouth 2 (two) times daily.  10/07/13  Yes [provider]  cholecalciferol (VITAMIN D) 400 UNITS TABS tablet Take 10,000 Units by mouth daily.    Yes [provider]  furosemide (LASIX) 20 MG tablet Take 20 mg by mouth daily. 01/15/21  Yes [provider]  insulin glargine (LANTUS) 100 UNIT/ML injection Inject 80 Units into the skin daily. 80 units in the am and 120 units at bedtime 01/14/12  Yes [provider]  insulin lispro (HUMALOG) 100 UNIT/ML KiwkPen 40 Units.  10/15/17  Yes [provider]  JARDIANCE 25 MG TABS tablet Take 25 mg by mouth daily. 06/21/21  Yes [provider]  losartan (COZAAR) 100 MG tablet Take 100 mg by mouth daily.  09/25/17  Yes [provider]  metFORMIN (GLUCOPHAGE) 1000 MG tablet Take 1,000 mg by mouth 2 (two) times daily. 06/17/21  Yes [provider]  naloxone (NARCAN) 0.4 MG/ML injection Inject as directed as directed   Yes [provider]  NOVOLOG FLEXPEN 100 UNIT/ML FlexPen Inject 100 Units into the skin 3 (three) times daily with meals. 11/13/22  Yes [provider]  Omega-3 Fatty Acids (FISH OIL) 1000 MG CAPS Take 1 capsule by mouth daily.  01/14/12  Yes [provider]  Oxycodone HCl 20 MG TABS Take 20 mg by mouth 4 (four) times daily as needed (pain).  06/20/15  Yes [provider]  sildenafil (REVATIO) 20 MG tablet TAKE 2 TO 5 TABLETS BY MOUTH 1 HOUR PRIOR TO INTERCOURSE 10/09/22  Yes Stoioff, Scott C, MD  TOUJEO MAX SOLOSTAR 300 UNIT/ML Solostar Pen Inject 80-120 Units into the skin in the morning and at bedtime. Inject 80 Units into the skin daily. 80 units in the am and 120 units at bedtime   Yes [provider]  vitamin C (ASCORBIC ACID) 250 MG tablet Take 250 mg by mouth daily.   Yes [provider]  AMBULATORY NON FORMULARY MEDICATION Trimix  (30/1/10)-(Pap/Phent/PGE)  Dosage: Inject 0.3 cc and may increase 0.1cc to achieve and erection lasting no longer than 1 hour per injection   Vial 1ml  Qty #5 refills 2  [facility] [phone] Fax [phone] 02/13/22   Riki Altes, MD  Nutritional Supplements (KETO PO) Take 1 capsule by mouth.    [provider]  simvastatin (ZOCOR) 40 MG tablet Take 40 mg by mouth daily. Patient not taking: Reported on 11/27/2022 07/07/16   [provider]  Testosterone 20.25 MG/ACT (1.62%) GEL APPLY 4 APPLICATIONS TOPICALLY DAILY. 05/12/22   Stoioff, Verna Czech, MD  TRULICITY 1.5 MG/0.5ML SOPN Inject 1.5 mg into the skin once a week. 01/16/21   [provider]  zolpidem (AMBIEN) 10 MG tablet Take 10 mg by mouth at bedtime as needed. Patient not taking: Reported on 11/27/2022 04/18/19   [provider]    Social History   Socioeconomic History   Marital status: Single    Spouse name: Not on file   Number of children: Not on file   Years of education: Not on file   Highest education level: Not on file  Occupational History   Not on file  Tobacco Use   Smoking status: Every Day    Current packs/day: 0.25    Average packs/day: 0.3 packs/day for 40.0 years (10.0 ttl pk-yrs)    Types: Cigarettes   Smokeless tobacco: Never  Vaping Use   Vaping status: Never Used  Substance and Sexual Activity   Alcohol use: Yes    Alcohol/week: 18.0 standard drinks of alcohol    Types: 12 Cans of beer, 6 Shots of liquor per week   Drug use: Not Currently    Types: Cocaine, Marijuana   Sexual activity: Yes  Other Topics Concern   Not on file  Social History Narrative   Not on file   Social Determinants of Health   Financial Resource Strain: Not on file  Food Insecurity: Not on file  Transportation Needs: No Transportation Needs (04/23/2020)   Received from [facility], [facility]   [facility] - Transportation    In the  past 12 months, has lack of transportation kept you from medical appointments or from getting medications?: No    Lack of Transportation (Non-Medical): No  Physical Activity: Not on file  Stress: Not on file  Social Connections: Not on file  Intimate Partner Violence: Not on file     Family History  Problem Relation Age of Onset   Diabetes Mother    Heart disease Mother    Diabetes Father    Heart disease Father    Diabetes Brother     ROS: Otherwise negative unless mentioned in HPI  Physical Examination  Vitals:   11/27/22 2308 11/28/22 0420  BP: 92/67 99/71  Pulse: 87 77  Resp: 18 16  Temp: 98.4 F (36.9 C) 98.5 F (36.9 C)  SpO2: 99% 96%  Body mass index is 34.73 kg/m.  General:  WDWN in NAD Gait: Not observed HENT: WNL, normocephalic Pulmonary: normal non-labored breathing, without Rales, rhonchi,  wheezing Cardiac: regular, without  Murmurs, rubs or gallops; without carotid bruits Abdomen: Positive bowel sounds, soft, NT/ND, no masses Skin: without rashes Vascular Exam/Pulses: Unable to palpate Bilateral lower extremity pulses.  Extremities: with ischemic changes, with Gangrene , with cellulitis; with open wounds;  Musculoskeletal: no muscle wasting or atrophy  Neurologic: A&O X 3;  No focal weakness or paresthesias are detected; speech is fluent/normal Psychiatric:  The pt has Normal affect. Lymph:  Unremarkable  CBC    Component Value Date/Time   WBC 6.2 11/28/2022 0417   RBC 4.52 11/28/2022 0417   HGB 14.3 11/28/2022 0417   HGB 16.4 08/02/2013 0916   HCT 40.7 11/28/2022 0417   HCT 47.1 03/27/2020 1012   PLT 177 11/28/2022 0417   PLT 205 08/02/2013 0916   MCV 90.0 11/28/2022 0417   MCV 94 08/02/2013 0916   MCH 31.6 11/28/2022 0417   MCHC 35.1 11/28/2022 0417   RDW 12.7 11/28/2022 0417   RDW 15.5 (H) 08/02/2013 0916   LYMPHSABS 2.0 11/27/2022 1051   LYMPHSABS 2.1 08/02/2013 0916   MONOABS 0.7 11/27/2022 1051   MONOABS 1.2 (H) 08/02/2013 0916    EOSABS 0.0 11/27/2022 1051   EOSABS 0.1 08/02/2013 0916   BASOSABS 0.0 11/27/2022 1051   BASOSABS 0.1 08/02/2013 0916    BMET    Component Value Date/Time   NA 133 (L) 11/28/2022 0417   K 4.2 11/28/2022 0417   CL 103 11/28/2022 0417   CO2 23 11/28/2022 0417   GLUCOSE 327 (H) 11/28/2022 0417   BUN 28 (H) 11/28/2022 0417   CREATININE 1.42 (H) 11/28/2022 0417   CALCIUM 8.1 (L) 11/28/2022 0417   GFRNONAA 54 (L) 11/28/2022 0417   GFRAA 44 (L) 04/15/2019 1432    COAGS: Lab Results  Component Value Date   INR 1.0 02/07/2021     Non-Invasive Vascular Imaging:   None Ordered  Statin:  Yes.   Beta Blocker:  No. Aspirin:  No. ACEI:  No. ARB:  Yes.   CCB use:  Yes Other antiplatelets/anticoagulants:  No.    ASSESSMENT/PLAN: This is a 67 y.o. male who presents to [facility] emergency department with left lower extremity gangrene. Vascular Surgery Consulted to evaluate.   Vascular surgery plans on taking the patient to the vascular lab later today 11/28/2022 for a left lower extremity angiogram with possible intervention.  I discussed in detail with the patient the procedure, benefits, risks, complications.  He wishes to proceed as soon as possible.  Patient was made n.p.o. after midnight.   -I discussed the plan with Dr. [name] MD and he agrees with the plan.   [facility] 11/28/2022 7:32 AM

## 2022-11-28 NOTE — Progress Notes (Signed)
PODIATRY / FOOT AND ANKLE SURGERY PROGRESS NOTE  Requesting Physician: Dr. Cyril Loosen  Reason for consult: left forefoot gangrene   HPI: Corey Foster is a 67 y.o. male who presents resting in bed fairly comfortably at this time.  He denies any pain to both feet.  Patient also denies nausea, vomiting, fevers, chills.  He underwent revascularization to left lower extremity today.  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   Sleep apnea     Surgical Hx:  Past Surgical History:  Procedure Laterality Date   CYSTOSCOPY W/ RETROGRADES Bilateral 05/31/2019   Procedure: CYSTOSCOPY WITH RETROGRADE PYELOGRAM;  Surgeon: Riki Altes, MD;  Location: [facility];  Service: Urology;  Laterality: Bilateral;   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-C N/A 05/31/2019   Procedure: TRANSURETHRAL RESECTION OF BLADDER TUMOR WITH gemcitabine;  Surgeon: Riki Altes, MD;  Location: [facility] OR[facility];  Service: Urology;  Laterality: N/A;    FHx:  Family History  Problem Relation Age of Onset   Diabetes Mother    Heart disease Mother    Diabetes Father    Heart disease Father    Diabetes Brother     Social History:  reports that he has been smoking cigarettes. He has a 10 pack-year smoking history. He has never used smokeless tobacco. He reports current alcohol use of about 18.0 standard drinks of alcohol per week. He reports that he does not currently use drugs after having used the following drugs: Cocaine and Marijuana.  Allergies:  Allergies  Allergen Reactions   Nsaids Other (See Comments)    AKI AKI     Medications Prior to Admission  Medication Sig Dispense Refill   albuterol (VENTOLIN HFA) 108 (90 Base) MCG/ACT inhaler Inhale 1 puff into the  lungs every 6 (six) hours as needed for wheezing or shortness of breath.      ALPRAZolam (XANAX) 1 MG tablet Take 1 mg by mouth 4 (four) times daily as needed.     amLODipine (NORVASC) 10 MG tablet Take 10 mg by mouth daily.     aspirin EC 325 MG tablet Take 325 mg by mouth daily.     baclofen (LIORESAL) 10 MG tablet Take 10 mg by mouth 2 (two) times daily.      cholecalciferol (VITAMIN D) 400 UNITS TABS tablet Take 10,000 Units by mouth daily.      furosemide (LASIX) 20 MG tablet Take 20 mg by mouth daily.     insulin glargine (LANTUS) 100 UNIT/ML injection Inject 80 Units into the skin daily. 80 units in the am and 120 units at bedtime     insulin lispro (HUMALOG) 100 UNIT/ML KiwkPen 40 Units.      JARDIANCE 25 MG TABS tablet Take 25 mg by mouth daily.     losartan (COZAAR) 100 MG tablet Take 100 mg by mouth daily.      metFORMIN (GLUCOPHAGE) 1000 MG tablet Take 1,000 mg by mouth 2 (two) times daily.     naloxone (NARCAN) 0.4 MG/ML injection Inject as directed as directed     NOVOLOG FLEXPEN 100 UNIT/ML FlexPen Inject 100 Units into the skin 3 (three) times daily with meals.     Omega-3 Fatty Acids (FISH OIL) 1000  MG CAPS Take 1 capsule by mouth daily.      Oxycodone HCl 20 MG TABS Take 20 mg by mouth 4 (four) times daily as needed (pain).      sildenafil (REVATIO) 20 MG tablet TAKE 2 TO 5 TABLETS BY MOUTH 1 HOUR PRIOR TO INTERCOURSE 30 tablet 0   TOUJEO MAX SOLOSTAR 300 UNIT/ML Solostar Pen Inject 80-120 Units into the skin in the morning and at bedtime. Inject 80 Units into the skin daily. 80 units in the am and 120 units at bedtime     vitamin C (ASCORBIC ACID) 250 MG tablet Take 250 mg by mouth daily.     AMBULATORY NON FORMULARY MEDICATION Trimix (30/1/10)-(Pap/Phent/PGE)  Dosage: Inject 0.3 cc and may increase 0.1cc to achieve and erection lasting no longer than 1 hour per injection   Vial 1ml  Qty #5 refills 2  [facility] [phone] Fax [phone] 5 mL 2    Nutritional Supplements (KETO PO) Take 1 capsule by mouth.     simvastatin (ZOCOR) 40 MG tablet Take 40 mg by mouth daily. (Patient not taking: Reported on 11/27/2022)     Testosterone 20.25 MG/ACT (1.62%) GEL APPLY 4 APPLICATIONS TOPICALLY DAILY. 150 g 2   TRULICITY 1.5 MG/0.5ML SOPN Inject 1.5 mg into the skin once a week.     zolpidem (AMBIEN) 10 MG tablet Take 10 mg by mouth at bedtime as needed. (Patient not taking: Reported on 11/27/2022)      Physical Exam: General: Alert and oriented.  No apparent distress.  Vascular: DP/PT pulses nonpalpable bil, no hair growth noted to both feet.  Mild nonpitting BLE edema.  Both feet appear to be warm to touch this time, improved overall since revascularization to left lower extremity.  Neuro: Light touch sensation reduced to BLE.  Derm: Skin is thin and brittle to both feet with very and vascular atrophic looking appearance.  Left hallux appears to have gangrene present to the IPJ hallux dorsally and extending plantarly past the IPJ hallux slightly distal to the first metatarsal phalangeal joint.  Both feet appear to be very dirty.  MSK: 5/5 strength bilateral lower extremities.  Results for orders placed or performed during the hospital encounter of 11/27/22 (from the past 48 hour(s))  CBC with Differential     Status: Abnormal   Collection Time: 11/27/22 10:51 AM  Result Value Ref Range   WBC 7.9 4.0 - 10.5 K/uL   RBC 5.48 4.22 - 5.81 MIL/uL   Hemoglobin 17.4 (H) 13.0 - 17.0 g/dL   HCT 25.3 66.4 - 40.3 %   MCV 91.4 80.0 - 100.0 fL   MCH 31.8 26.0 - 34.0 pg   MCHC 34.7 30.0 - 36.0 g/dL   RDW 47.4 25.9 - 56.3 %   Platelets 229 150 - 400 K/uL   nRBC 0.0 0.0 - 0.2 %   Neutrophils Relative % 65 %   Neutro Abs 5.1 1.7 - 7.7 K/uL   Lymphocytes Relative 25 %   Lymphs Abs 2.0 0.7 - 4.0 K/uL   Monocytes Relative 9 %   Monocytes Absolute 0.7 0.1 - 1.0 K/uL   Eosinophils Relative 0 %   Eosinophils Absolute 0.0 0.0 - 0.5 K/uL   Basophils  Relative 0 %   Basophils Absolute 0.0 0.0 - 0.1 K/uL   Immature Granulocytes 1 %   Abs Immature Granulocytes 0.06 0.00 - 0.07 K/uL    Comment: Performed at [facility], [address], [place], [place] [address]  Comprehensive metabolic panel     Status: Abnormal   Collection Time: 11/27/22 10:51 AM  Result Value Ref Range   Sodium 128 (L) 135 - 145 mmol/L   Potassium 4.7 3.5 - 5.1 mmol/L   Chloride 89 (L) 98 - 111 mmol/L   CO2 25 22 - 32 mmol/L   Glucose, Bld 470 (H) 70 - 99 mg/dL    Comment: Glucose reference range applies only to samples taken after fasting for at least 8 hours.   BUN 47 (H) 8 - 23 mg/dL   Creatinine, Ser 0.98 (H) 0.61 - 1.24 mg/dL   Calcium 8.9 8.9 - 11.9 mg/dL   Total Protein 7.9 6.5 - 8.1 g/dL   Albumin 3.2 (L) 3.5 - 5.0 g/dL   AST 12 (L) 15 - 41 U/L   ALT 13 0 - 44 U/L   Alkaline Phosphatase 80 38 - 126 U/L   Total Bilirubin 0.4 0.3 - 1.2 mg/dL   GFR, Estimated 35 (L) >60 mL/min    Comment: (NOTE) Calculated using the CKD-EPI Creatinine Equation (2021)    Anion gap 14 5 - 15    Comment: Performed at [facility], [address] [place], [place] [address]  Lactic acid, plasma     Status: Abnormal   Collection Time: 11/27/22 10:51 AM  Result Value Ref Range   Lactic Acid, Venous 2.0 (HH) 0.5 - 1.9 mmol/L    Comment: CRITICAL RESULT CALLED TO, READ BACK BY AND VERIFIED WITH BILLY MONTEE AT 1127 11/27/22 DAS Performed at [facility], [address] [place], [place] [address]   Blood culture (routine x 2)     Status: None (Preliminary result)   Collection Time: 11/27/22 11:40 AM   Specimen: BLOOD  Result Value Ref Range   Specimen Description BLOOD RIGTH HAND    Special Requests      BOTTLES DRAWN AEROBIC ONLY Blood Culture results may not be optimal due to an inadequate volume of blood received in culture bottles   Culture      NO GROWTH < 24 HOURS Performed at [facility], [address], [place],  [place] [address]    Report Status PENDING   Blood culture (routine x 2)     Status: None (Preliminary result)   Collection Time: 11/27/22 11:40 AM   Specimen: BLOOD  Result Value Ref Range   Specimen Description BLOOD LEFT HAND    Special Requests      BOTTLES DRAWN AEROBIC ONLY Blood Culture results may not be optimal due to an inadequate volume of blood received in culture bottles   Culture      NO GROWTH < 24 HOURS Performed at [facility], [address], [place], [place] [address]    Report Status PENDING   Lactic acid, plasma     Status: None   Collection Time: 11/27/22  1:49 PM  Result Value Ref Range   Lactic Acid, Venous 1.4 0.5 - 1.9 mmol/L    Comment: Performed at [facility], [address] [place], [place] [address]  CBG monitoring, ED     Status: Abnormal   Collection Time: 11/27/22  4:27 PM  Result Value Ref Range   Glucose-Capillary 381 (H) 70 - 99 mg/dL    Comment: Glucose reference range applies only to samples taken after fasting for at least 8 hours.  Glucose, capillary     Status: Abnormal   Collection Time: 11/27/22  8:53 PM  Result Value Ref Range  Glucose-Capillary 383 (H) 70 - 99 mg/dL    Comment: Glucose reference range applies only to samples taken after fasting for at least 8 hours.  Basic metabolic panel     Status: Abnormal   Collection Time: 11/28/22  4:17 AM  Result Value Ref Range   Sodium 133 (L) 135 - 145 mmol/L   Potassium 4.2 3.5 - 5.1 mmol/L   Chloride 103 98 - 111 mmol/L   CO2 23 22 - 32 mmol/L   Glucose, Bld 327 (H) 70 - 99 mg/dL    Comment: Glucose reference range applies only to samples taken after fasting for at least 8 hours.   BUN 28 (H) 8 - 23 mg/dL   Creatinine, Ser 7.82 (H) 0.61 - 1.24 mg/dL   Calcium 8.1 (L) 8.9 - 10.3 mg/dL   GFR, Estimated 54 (L) >60 mL/min    Comment: (NOTE) Calculated using the CKD-EPI Creatinine Equation (2021)    Anion gap 7 5 - 15    Comment: Performed at [facility], [address] [place], [place] [address]  CBC     Status: None   Collection Time: 11/28/22  4:17 AM  Result Value Ref Range   WBC 6.2 4.0 - 10.5 K/uL   RBC 4.52 4.22 - 5.81 MIL/uL   Hemoglobin 14.3 13.0 - 17.0 g/dL   HCT 30.8 65.7 - 84.6 %   MCV 90.0 80.0 - 100.0 fL   MCH 31.6 26.0 - 34.0 pg   MCHC 35.1 30.0 - 36.0 g/dL   RDW 96.2 95.2 - 84.1 %   Platelets 177 150 - 400 K/uL   nRBC 0.0 0.0 - 0.2 %    Comment: Performed at [facility], [address] [place], [place] [address]  Glucose, capillary     Status: Abnormal   Collection Time: 11/28/22  7:46 AM  Result Value Ref Range   Glucose-Capillary 235 (H) 70 - 99 mg/dL    Comment: Glucose reference range applies only to samples taken after fasting for at least 8 hours.  Glucose, capillary     Status: Abnormal   Collection Time: 11/28/22 10:04 AM  Result Value Ref Range   Glucose-Capillary 166 (H) 70 - 99 mg/dL    Comment: Glucose reference range applies only to samples taken after fasting for at least 8 hours.  Glucose, capillary     Status: Abnormal   Collection Time: 11/28/22 11:54 AM  Result Value Ref Range   Glucose-Capillary 125 (H) 70 - 99 mg/dL    Comment: Glucose reference range applies only to samples taken after fasting for at least 8 hours.   PERIPHERAL VASCULAR CATHETERIZATION  Result Date: 11/28/2022 See surgical note for result.  DG Foot Complete Left  Result Date: 11/27/2022 CLINICAL DATA:  Left great toe gangrene. EXAM: LEFT FOOT - COMPLETE 3+ VIEW COMPARISON:  None Available. FINDINGS: Mild hallux valgus. Mildly decreased bone mineralization. Moderate great toe soft tissue swelling. No definite cortical erosion is seen. No subcutaneous air. Joint spaces are preserved. No acute fracture dislocation. High-grade atherosclerotic calcifications. IMPRESSION: Moderate great toe soft tissue swelling. No definite cortical erosion is seen. No subcutaneous air. Electronically Signed   By: Neita Garnet M.D.   On:  11/27/2022 13:31    Blood pressure 103/61, pulse 81, temperature 98.4 F (36.9 C), temperature source Oral, resp. rate 18, height 5\' 11"  (1.803 m), weight 112.9 kg, SpO2 97%.  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 -Discussed left hallux dry gangrene and had slightly progressed now close to the first metatarsal phalangeal joint area. -Appreciate vascular recommendations.  Appears to improved circulation left lower extremity for potentially upcoming amputation. All treatment options were discussed with the patient of both conservative and surgical attempts at correction including potential risks and complications.  Patient has elected for procedure consisting of left partial first ray amputation.  No guarantees given.  Consent obtained.  Discussed with patient that this may not heal even though vascular has improved some of the blood flow.  Skin appears to be fairly thin and atrophic overall which may lead to wound healing complications.  Patient remains at high risk for limb loss. -Patient to be n.p.o. at midnight for procedure tomorrow in the morning at 0730.  Orders placed. -Recommend Betadine paint to left hallux daily.  Will likely sign off on patient for discharge after procedure tomorrow with f/u in 1 week after discharge date.  Rosetta Posner, DPM 11/28/2022, 1:35 PM

## 2022-11-28 NOTE — Progress Notes (Signed)
Progress Note   Patient: Corey Foster FIE:[id number] DOB: [birth year] DOA: 11/27/2022     1 DOS: the patient was seen and examined on 11/28/2022   Brief hospital course: Mr. Decarlo Matsen is a 67 year old male with history of hypertension, obesity, insulin-dependent diabetes mellitus, depression, anxiety, erectile dysfunction, insomnia, who presents emergency department for chief concerns of left foot gangrene.  Patient is treated with broad-spectrum antibiotics.  Patient was seen by vascular surgery, angioplasty was performed on 10/4.   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 Patient seems to have significant left great toe dry gangrene, x-ray does not show any bony erosion suggesting osteomyelitis. Patient is status post vascular intervention, continue antibiotics for today and tomorrow.  May consider discontinuation at discharge.   Type II diabetes mellitus with renal manifestations (HCC) uncontrolled with hyperglycemia. Restart insulin glargine at 60 units twice a day, will need to increase dose tomorrow after eating a regular diet.   Acute kidney injury on CKD stage 3b, GFR 30-44 ml/min (HCC) Hyperosmolar hyponatremia Sodium level is better after fluids. Creatinine dropped down from 2.02 to 1.42.   Anxiety PDMP reviewed Home alprazolam 1 mg p.o. 4 times daily as needed for anxiety resumed   HLD (hyperlipidemia) Home simvastatin 40 mg daily resumed   Smoking Advised to quit.   Essential  hypertension Continue to follow, blood pressure not elevated.       Subjective:  Patient doing well, does not have any pain.  Physical Exam: Vitals:   11/28/22 1055 11/28/22 1107 11/28/22 1115 11/28/22 1130  BP: 96/60 (!) 97/53 91/62 103/61  Pulse: 84 83 78 81  Resp: 15 16 16 18   Temp:      TempSrc:      SpO2: 100% 97% 95% 97%  Weight:      Height:       General exam: Appears calm and comfortable  Respiratory system: Clear to auscultation. Respiratory effort normal. Cardiovascular system: S1 & S2 heard, RRR. No JVD, murmurs, rubs, gallops or clicks. No pedal edema. Gastrointestinal system: Abdomen is nondistended, soft and nontender. No organomegaly or masses felt. Normal bowel sounds heard. Central nervous system: Alert and oriented. No focal neurological deficits. Extremities: Left great toe dry gangrene. Skin: No rashes, lesions or ulcers Psychiatry: Judgement and insight appear normal. Mood & affect appropriate.    Data Reviewed:  X-ray and lab results reviewed.  Family Communication: Son updated over the phone.  Disposition: Status is: Inpatient Remains inpatient appropriate because: Severity of disease, IV treatment.     Time spent: 35 minutes  Author: Marrion Coy, MD 11/28/2022 11:55 AM  For on call review [email or web address].

## 2022-11-28 NOTE — H&P (View-Only) (Signed)
Hospital Consult    Reason for Consult:  Left Lower extremity Gangrene Requesting Physician:  Dr. Londell Moh MD  MRN #:  [id number]  History of Present Illness: This is a 67 y.o. male with history of hypertension, obesity, insulin-dependent diabetes mellitus, depression, anxiety, erectile dysfunction, insomnia, who presents emergency department for chief concerns of left foot gangrene.   On exam patient appears to have vascular insufficiency to his left lower extremity. Vascular Surgery consulted to evaluate.   Past Medical History:  Diagnosis Date   Arthritis    Diabetes mellitus without complication (HCC)    DJD (degenerative joint disease)    GERD (gastroesophageal reflux disease)    Hyperlipidemia    Hypertension    Sciatic nerve pain    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   Sleep apnea     Past Surgical History:  Procedure Laterality Date   CYSTOSCOPY W/ RETROGRADES Bilateral 05/31/2019   Procedure: CYSTOSCOPY WITH RETROGRADE PYELOGRAM;  Surgeon: Riki Altes, MD;  Location: [facility] ORS;  Service: Urology;  Laterality: Bilateral;   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 Authorizing Provider  albuterol (VENTOLIN HFA) 108 (90 Base) MCG/ACT inhaler Inhale 1 puff into the lungs every 6 (six) hours as needed for wheezing or shortness of breath.  02/15/13  Yes [provider]  ALPRAZolam Prudy Feeler) 1 MG tablet Take 1 mg by mouth 4 (four) times daily as needed. 08/27/19  Yes [provider]  amLODipine (NORVASC) 10 MG tablet Take 10 mg by mouth daily. 03/16/17  Yes [provider]  aspirin EC 325 MG tablet Take 325  mg by mouth daily.   Yes [provider]  baclofen (LIORESAL) 10 MG tablet Take 10 mg by mouth 2 (two) times daily.  10/07/13  Yes [provider]  cholecalciferol (VITAMIN D) 400 UNITS TABS tablet Take 10,000 Units by mouth daily.    Yes [provider]  furosemide (LASIX) 20 MG tablet Take 20 mg by mouth daily. 01/15/21  Yes [provider]  insulin glargine (LANTUS) 100 UNIT/ML injection Inject 80 Units into the skin daily. 80 units in the am and 120 units at bedtime 01/14/12  Yes [provider]  insulin lispro (HUMALOG) 100 UNIT/ML KiwkPen 40 Units.  10/15/17  Yes [provider]  JARDIANCE 25 MG TABS tablet Take 25 mg by mouth daily. 06/21/21  Yes [provider]  losartan (COZAAR) 100 MG tablet Take 100 mg by mouth daily.  09/25/17  Yes [provider]  metFORMIN (GLUCOPHAGE) 1000 MG tablet Take 1,000 mg by mouth 2 (two) times daily. 06/17/21  Yes [provider]  naloxone (NARCAN) 0.4 MG/ML injection Inject as directed as directed   Yes [provider]  NOVOLOG FLEXPEN 100 UNIT/ML FlexPen Inject 100 Units into the skin 3 (three) times daily with meals. 11/13/22  Yes [provider]  Omega-3 Fatty Acids (FISH OIL) 1000 MG CAPS Take 1 capsule by mouth daily.  01/14/12  Yes [provider]  Oxycodone HCl 20 MG TABS Take 20 mg by mouth 4 (four) times daily as needed (pain).  06/20/15  Yes [provider]  sildenafil (REVATIO) 20 MG tablet TAKE 2 TO 5 TABLETS BY MOUTH 1 HOUR PRIOR TO INTERCOURSE 10/09/22  Yes Stoioff, Scott C, MD  TOUJEO MAX SOLOSTAR 300 UNIT/ML Solostar Pen Inject 80-120 Units into the skin in the morning and at bedtime. Inject 80 Units into the skin daily. 80 units in the am and 120 units at bedtime   Yes [provider]  vitamin C (ASCORBIC ACID) 250 MG tablet Take 250 mg by mouth daily.   Yes [provider]  AMBULATORY NON FORMULARY MEDICATION Trimix  (30/1/10)-(Pap/Phent/PGE)  Dosage: Inject 0.3 cc and may increase 0.1cc to achieve and erection lasting no longer than 1 hour per injection   Vial 1ml  Qty #5 refills 2  [facility] [phone] Fax [phone] 02/13/22   Riki Altes, MD  Nutritional Supplements (KETO PO) Take 1 capsule by mouth.    [provider]  simvastatin (ZOCOR) 40 MG tablet Take 40 mg by mouth daily. Patient not taking: Reported on 11/27/2022 07/07/16   [provider]  Testosterone 20.25 MG/ACT (1.62%) GEL APPLY 4 APPLICATIONS TOPICALLY DAILY. 05/12/22   Stoioff, Verna Czech, MD  TRULICITY 1.5 MG/0.5ML SOPN Inject 1.5 mg into the skin once a week. 01/16/21   [provider]  zolpidem (AMBIEN) 10 MG tablet Take 10 mg by mouth at bedtime as needed. Patient not taking: Reported on 11/27/2022 04/18/19   [provider]    Social History   Socioeconomic History   Marital status: Single    Spouse name: Not on file   Number of children: Not on file   Years of education: Not on file   Highest education level: Not on file  Occupational History   Not on file  Tobacco Use   Smoking status: Every Day    Current packs/day: 0.25    Average packs/day: 0.3 packs/day for 40.0 years (10.0 ttl pk-yrs)    Types: Cigarettes   Smokeless tobacco: Never  Vaping Use   Vaping status: Never Used  Substance and Sexual Activity   Alcohol use: Yes    Alcohol/week: 18.0 standard drinks of alcohol    Types: 12 Cans of beer, 6 Shots of liquor per week   Drug use: Not Currently    Types: Cocaine, Marijuana   Sexual activity: Yes  Other Topics Concern   Not on file  Social History Narrative   Not on file   Social Determinants of Health   Financial Resource Strain: Not on file  Food Insecurity: Not on file  Transportation Needs: No Transportation Needs (04/23/2020)   Received from [facility], [facility]   [facility] - Transportation    In the  past 12 months, has lack of transportation kept you from medical appointments or from getting medications?: No    Lack of Transportation (Non-Medical): No  Physical Activity: Not on file  Stress: Not on file  Social Connections: Not on file  Intimate Partner Violence: Not on file     Family History  Problem Relation Age of Onset   Diabetes Mother    Heart disease Mother    Diabetes Father    Heart disease Father    Diabetes Brother     ROS: Otherwise negative unless mentioned in HPI  Physical Examination  Vitals:   11/27/22 2308 11/28/22 0420  BP: 92/67 99/71  Pulse: 87 77  Resp: 18 16  Temp: 98.4 F (36.9 C) 98.5 F (36.9 C)  SpO2: 99% 96%  Body mass index is 34.73 kg/m.  General:  WDWN in NAD Gait: Not observed HENT: WNL, normocephalic Pulmonary: normal non-labored breathing, without Rales, rhonchi,  wheezing Cardiac: regular, without  Murmurs, rubs or gallops; without carotid bruits Abdomen: Positive bowel sounds, soft, NT/ND, no masses Skin: without rashes Vascular Exam/Pulses: Unable to palpate Bilateral lower extremity pulses.  Extremities: with ischemic changes, with Gangrene , with cellulitis; with open wounds;  Musculoskeletal: no muscle wasting or atrophy  Neurologic: A&O X 3;  No focal weakness or paresthesias are detected; speech is fluent/normal Psychiatric:  The pt has Normal affect. Lymph:  Unremarkable  CBC    Component Value Date/Time   WBC 6.2 11/28/2022 0417   RBC 4.52 11/28/2022 0417   HGB 14.3 11/28/2022 0417   HGB 16.4 08/02/2013 0916   HCT 40.7 11/28/2022 0417   HCT 47.1 03/27/2020 1012   PLT 177 11/28/2022 0417   PLT 205 08/02/2013 0916   MCV 90.0 11/28/2022 0417   MCV 94 08/02/2013 0916   MCH 31.6 11/28/2022 0417   MCHC 35.1 11/28/2022 0417   RDW 12.7 11/28/2022 0417   RDW 15.5 (H) 08/02/2013 0916   LYMPHSABS 2.0 11/27/2022 1051   LYMPHSABS 2.1 08/02/2013 0916   MONOABS 0.7 11/27/2022 1051   MONOABS 1.2 (H) 08/02/2013 0916    EOSABS 0.0 11/27/2022 1051   EOSABS 0.1 08/02/2013 0916   BASOSABS 0.0 11/27/2022 1051   BASOSABS 0.1 08/02/2013 0916    BMET    Component Value Date/Time   NA 133 (L) 11/28/2022 0417   K 4.2 11/28/2022 0417   CL 103 11/28/2022 0417   CO2 23 11/28/2022 0417   GLUCOSE 327 (H) 11/28/2022 0417   BUN 28 (H) 11/28/2022 0417   CREATININE 1.42 (H) 11/28/2022 0417   CALCIUM 8.1 (L) 11/28/2022 0417   GFRNONAA 54 (L) 11/28/2022 0417   GFRAA 44 (L) 04/15/2019 1432    COAGS: Lab Results  Component Value Date   INR 1.0 02/07/2021     Non-Invasive Vascular Imaging:   None Ordered  Statin:  Yes.   Beta Blocker:  No. Aspirin:  No. ACEI:  No. ARB:  Yes.   CCB use:  Yes Other antiplatelets/anticoagulants:  No.    ASSESSMENT/PLAN: This is a 67 y.o. male who presents to [facility] emergency department with left lower extremity gangrene. Vascular Surgery Consulted to evaluate.   Vascular surgery plans on taking the patient to the vascular lab later today 11/28/2022 for a left lower extremity angiogram with possible intervention.  I discussed in detail with the patient the procedure, benefits, risks, complications.  He wishes to proceed as soon as possible.  Patient was made n.p.o. after midnight.   -I discussed the plan with Dr. [name] MD and he agrees with the plan.   Marcie Bal [facility] 11/28/2022 7:32 AM

## 2022-11-29 ENCOUNTER — Inpatient Hospital Stay: Payer: Medicare HMO | Admitting: Anesthesiology

## 2022-11-29 ENCOUNTER — Encounter
Admission: EM | Disposition: A | Discharge status: Home / Self Care | Payer: Self-pay | Source: Home / Self Care | Attending: Internal Medicine

## 2022-11-29 DIAGNOSIS — N1832 Chronic kidney disease, stage 3b: Secondary | ICD-10-CM | POA: Diagnosis not present

## 2022-11-29 DIAGNOSIS — I96 Gangrene, not elsewhere classified: Secondary | ICD-10-CM | POA: Diagnosis not present

## 2022-11-29 DIAGNOSIS — E1122 Type 2 diabetes mellitus with diabetic chronic kidney disease: Secondary | ICD-10-CM | POA: Diagnosis not present

## 2022-11-29 DIAGNOSIS — I10 Essential (primary) hypertension: Secondary | ICD-10-CM

## 2022-11-29 HISTORY — PX: AMPUTATION: SHX166

## 2022-11-29 LAB — GLUCOSE, CAPILLARY
Glucose-Capillary: 67 mg/dL — ABNORMAL LOW (ref 70–99)
Glucose-Capillary: 79 mg/dL (ref 70–99)
Glucose-Capillary: 359 mg/dL — ABNORMAL HIGH (ref 70–99)

## 2022-11-29 SURGERY — AMPUTATION, FOOT, RAY
Anesthesia: General | Laterality: Left

## 2022-11-29 MED ORDER — LIDOCAINE HCL (CARDIAC) PF 100 MG/5ML IV SOSY
PREFILLED_SYRINGE | INTRAVENOUS | Status: DC | PRN
Start: 2022-11-29 — End: 2022-11-29
  Administered 2022-11-29: 100 mg via INTRAVENOUS

## 2022-11-29 MED ORDER — PROPOFOL 500 MG/50ML IV EMUL
INTRAVENOUS | Status: DC | PRN
  Administered 2022-11-29: 50 ug/kg/min via INTRAVENOUS

## 2022-11-29 MED ORDER — PROPOFOL 10 MG/ML IV BOLUS
INTRAVENOUS | Status: DC | PRN
  Administered 2022-11-29: 50 mg via INTRAVENOUS

## 2022-11-29 MED ORDER — OXYCODONE HCL 5 MG/5ML PO SOLN: 5.0000 mg | Freq: Once | ORAL | Status: DC | PRN

## 2022-11-29 MED ORDER — SODIUM CHLORIDE 0.9 % IV SOLN: INTRAVENOUS | Status: DC | PRN

## 2022-11-29 MED ORDER — ONDANSETRON HCL 4 MG/2ML IJ SOLN: 4.0000 mg | Freq: Once | INTRAMUSCULAR | Status: DC | PRN

## 2022-11-29 MED ORDER — MIDAZOLAM HCL 2 MG/2ML IJ SOLN
INTRAMUSCULAR | Status: DC | PRN
  Administered 2022-11-29: 2 mg via INTRAVENOUS

## 2022-11-29 MED ORDER — MIDAZOLAM HCL 2 MG/2ML IJ SOLN
INTRAMUSCULAR | Status: AC
  Filled 2022-11-29: qty 2

## 2022-11-29 MED ORDER — FENTANYL CITRATE (PF) 100 MCG/2ML IJ SOLN: 25.0000 ug | INTRAMUSCULAR | Status: DC | PRN

## 2022-11-29 MED ORDER — SIMVASTATIN 40 MG PO TABS: 40.0000 mg | ORAL_TABLET | Freq: Every day | ORAL | 0 refills | Status: DC

## 2022-11-29 MED ORDER — CEFAZOLIN SODIUM-DEXTROSE 2-4 GM/100ML-% IV SOLN
INTRAVENOUS | Status: AC
  Filled 2022-11-29: qty 100

## 2022-11-29 MED ORDER — NOVOLOG FLEXPEN 100 UNIT/ML ~~LOC~~ SOPN: 40.0000 [IU] | PEN_INJECTOR | Freq: Three times a day (TID) | SUBCUTANEOUS | Status: AC

## 2022-11-29 MED ORDER — OXYCODONE HCL 5 MG PO TABS: 5.0000 mg | ORAL_TABLET | Freq: Once | ORAL | Status: DC | PRN

## 2022-11-29 MED ORDER — ACETAMINOPHEN 10 MG/ML IV SOLN: 1000.0000 mg | Freq: Once | INTRAVENOUS | Status: DC | PRN

## 2022-11-29 MED ORDER — FENTANYL CITRATE (PF) 100 MCG/2ML IJ SOLN
INTRAMUSCULAR | Status: AC
  Filled 2022-11-29: qty 2

## 2022-11-29 MED ORDER — FENTANYL CITRATE (PF) 100 MCG/2ML IJ SOLN
INTRAMUSCULAR | Status: DC | PRN
  Administered 2022-11-29: 25 ug via INTRAVENOUS

## 2022-11-29 MED ORDER — 0.9 % SODIUM CHLORIDE (POUR BTL) OPTIME
TOPICAL | Status: DC | PRN
  Administered 2022-11-29: 500 mL

## 2022-11-29 MED ORDER — PROPOFOL 1000 MG/100ML IV EMUL
INTRAVENOUS | Status: AC
  Filled 2022-11-29: qty 100

## 2022-11-29 MED ORDER — VANCOMYCIN HCL 2000 MG/400ML IV SOLN
2000.0000 mg | INTRAVENOUS | Status: DC
  Administered 2022-11-29: 2000 mg via INTRAVENOUS
  Filled 2022-11-29: qty 400

## 2022-11-29 MED ORDER — BUPIVACAINE HCL (PF) 0.5 % IJ SOLN
INTRAMUSCULAR | Status: DC | PRN
  Administered 2022-11-29: 20 mL

## 2022-11-29 MED ORDER — DEXAMETHASONE SODIUM PHOSPHATE 10 MG/ML IJ SOLN
INTRAMUSCULAR | Status: DC | PRN
  Administered 2022-11-29: 10 mg via INTRAVENOUS

## 2022-11-29 MED ORDER — ONDANSETRON HCL 4 MG/2ML IJ SOLN
INTRAMUSCULAR | Status: DC | PRN
  Administered 2022-11-29: 4 mg via INTRAVENOUS

## 2022-11-29 SURGICAL SUPPLY — 32 items
BNDG CMPR 5X4 KNIT ELC UNQ LF (GAUZE/BANDAGES/DRESSINGS) ×1
BNDG ELASTIC 4INX 5YD STR LF (GAUZE/BANDAGES/DRESSINGS) ×2 IMPLANT
BNDG ESMARCH 4 X 12 STRL LF (GAUZE/BANDAGES/DRESSINGS) ×1
BNDG ESMARCH 4X12 STRL LF (GAUZE/BANDAGES/DRESSINGS) ×2 IMPLANT
BNDG GAUZE DERMACEA FLUFF 4 (GAUZE/BANDAGES/DRESSINGS) ×4 IMPLANT
BNDG GZE DERMACEA 4 6PLY (GAUZE/BANDAGES/DRESSINGS) ×1
CUFF TOURN SGL QUICK 18X4 (TOURNIQUET CUFF) ×2 IMPLANT
DURAPREP 26ML APPLICATOR (WOUND CARE) ×2 IMPLANT
ELECT REM PT RETURN 9FT ADLT (ELECTROSURGICAL) ×1
ELECTRODE REM PT RTRN 9FT ADLT (ELECTROSURGICAL) ×2 IMPLANT
GAUZE SPONGE 4X4 12PLY STRL (GAUZE/BANDAGES/DRESSINGS) ×2 IMPLANT
GAUZE XEROFORM 1X8 LF (GAUZE/BANDAGES/DRESSINGS) ×4 IMPLANT
GLOVE BIO SURGEON STRL SZ7 (GLOVE) ×2 IMPLANT
GLOVE INDICATOR 7.0 STRL GRN (GLOVE) ×2 IMPLANT
GLOVE INDICATOR 7.5 STRL GRN (GLOVE) ×2 IMPLANT
GOWN STRL REUS W/ TWL LRG LVL3 (GOWN DISPOSABLE) ×4 IMPLANT
GOWN STRL REUS W/TWL LRG LVL3 (GOWN DISPOSABLE) ×2
KIT TURNOVER KIT A (KITS) ×2 IMPLANT
MANIFOLD NEPTUNE II (INSTRUMENTS) ×2 IMPLANT
NDL HYPO 25X1 1.5 SAFETY (NEEDLE) ×4 IMPLANT
NDL SAFETY ECLIP 18X1.5 (MISCELLANEOUS) ×2 IMPLANT
NEEDLE HYPO 25X1 1.5 SAFETY (NEEDLE) ×1 IMPLANT
NS IRRIG 500ML POUR BTL (IV SOLUTION) ×2 IMPLANT
PACK EXTREMITY ARMC (MISCELLANEOUS) ×2 IMPLANT
PAD ABD DERMACEA PRESS 5X9 (GAUZE/BANDAGES/DRESSINGS) IMPLANT
SPONGE T-LAP 18X18 ~~LOC~~+RFID (SPONGE) ×2 IMPLANT
STOCKINETTE M/LG 89821 (MISCELLANEOUS) ×2 IMPLANT
SUT ETHILON 2 0 FS 18 (SUTURE) ×4 IMPLANT
SUT ETHILON 3-0 FS-10 30 BLK (SUTURE) ×1
SUTURE EHLN 3-0 FS-10 30 BLK (SUTURE) ×4 IMPLANT
SYR 10ML LL (SYRINGE) ×4 IMPLANT
TRAP FLUID SMOKE EVACUATOR (MISCELLANEOUS) ×2 IMPLANT

## 2022-11-29 NOTE — Discharge Summary (Addendum)
Physician Discharge Summary   Patient: Corey Foster MRN: [id number] DOB: Aug 26, 1955  Admit date:     11/27/2022  Discharge date: 11/29/22  Discharge Physician: Marrion Coy   PCP: Emogene Morgan, MD   Recommendations at discharge:   Follow-up with PCP in 1 week.   Discharge Diagnoses: Principal Problem:   Foot osteomyelitis, left (HCC) Active Problems:   Type II diabetes mellitus with renal manifestations (HCC)   Secondary erythrocytosis   Essential hypertension   Benign prostatic hyperplasia with urinary obstruction   ED (erectile dysfunction) of organic origin   Testicular hypofunction   Smoking   HLD (hyperlipidemia)   Anxiety   Hyperglycemia   Hyperosmolar hyponatremia   CKD stage 3b, GFR 30-44 ml/min (HCC)   Gangrene (HCC) Obesity BMI 34.73. Resolved Problems:   * No resolved hospital problems. [facility] Surgery Course: [name] is a 67 year old male with history of hypertension, obesity, insulin-dependent diabetes mellitus, depression, anxiety, erectile dysfunction, insomnia, who presents emergency department for chief concerns of left foot gangrene.  Patient is treated with broad-spectrum antibiotics.  Patient was seen by vascular surgery, angioplasty was performed on 10/4. Patient had a great toe amputation on 10/5, no evidence of infection.  Antibiotics discontinued per recommendation from podiatry.  At this point, patient medically stable to be discharged.  Patient also was placed on reduced dose of Lantus, but still developed hypoglycemia due to n.p.o. status in the morning.  He received reduced dose of insulin this morning postop, but glucose running above 300s again.  At this point, I will resume his long acting insulin dose, but reduce short acting to 40 units 3 times a day.  Assessment and Plan:  Left great toe dry gangrene. Osteomyelitis ruled out. Patient seems to have significant left great toe dry gangrene, x-ray does not show any bony erosion  suggesting osteomyelitis. Patient is status post vascular intervention, will continue aspirin and statin. Status post great toe amputation 10/5.  No evidence of infection per antibiotic discontinued.   Type II diabetes mellitus with renal manifestations (HCC) uncontrolled with hyperglycemia. Patient glucose low this morning after n.p.o. status overnight.  Then glucose went up again to above 300.  I will restart home dose of long-acting insulin, but reduce short acting to 40 units 3 times a day.  Patient instructed to increase back to home dose by tomorrow if glucose still running high.   Acute kidney injury on CKD stage 3b, GFR 30-44 ml/min (HCC) Hyperosmolar hyponatremia Sodium level is better after fluids. Creatinine dropped down from 2.02 to 1.42.   Anxiety PDMP reviewed Home alprazolam 1 mg p.o. 4 times daily as needed for anxiety resumed   HLD (hyperlipidemia) Home simvastatin 40 mg daily resumed   Smoking Advised to quit.   Essential hypertension Continue to follow, blood pressure not elevated.      Consultants: Vascular surgery, podiatry. Procedures performed: Lower extremity stent, great toe amputation. Disposition: Home Diet recommendation:  Discharge Diet Orders (From admission, onward)     Start     Ordered   11/29/22 0000  Diet Carb Modified        11/29/22 1235           Carb modified diet DISCHARGE MEDICATION: Allergies as of 11/29/2022       Reactions   Nsaids Other (See Comments)   AKI AKI        Medication List     STOP taking these medications    amLODipine 10 MG tablet  Commonly known as: NORVASC   insulin lispro 100 UNIT/ML KiwkPen Commonly known as: HUMALOG   zolpidem 10 MG tablet Commonly known as: AMBIEN       TAKE these medications    albuterol 108 (90 Base) MCG/ACT inhaler Commonly known as: VENTOLIN HFA Inhale 1 puff into the lungs every 6 (six) hours as needed for wheezing or shortness of breath.   ALPRAZolam 1 MG  tablet Commonly known as: XANAX Take 1 mg by mouth 4 (four) times daily as needed.   AMBULATORY NON FORMULARY MEDICATION Trimix (30/1/10)-(Pap/Phent/PGE)  Dosage: Inject 0.3 cc and may increase 0.1cc to achieve and erection lasting no longer than 1 hour per injection   Vial 1ml  Qty #5 refills 2  [facility] [phone] Fax [phone]   aspirin EC 325 MG tablet Take 325 mg by mouth daily.   baclofen 10 MG tablet Commonly known as: LIORESAL Take 10 mg by mouth 2 (two) times daily.   cholecalciferol 10 MCG (400 UNIT) Tabs tablet Commonly known as: VITAMIN D3 Take 10,000 Units by mouth daily.   Fish Oil 1000 MG Caps Take 1 capsule by mouth daily.   furosemide 20 MG tablet Commonly known as: LASIX Take 20 mg by mouth daily.   Toujeo Max SoloStar 300 UNIT/ML Solostar Pen Generic drug: insulin glargine (2 Unit Dial) Inject 80-120 Units into the skin in the morning and at bedtime. Inject 80 Units into the skin daily. 80 units in the am and 120 units at bedtime   insulin glargine 100 UNIT/ML injection Commonly known as: LANTUS Inject 80 Units into the skin daily. 80 units in the am and 120 units at bedtime   Jardiance 25 MG Tabs tablet Generic drug: empagliflozin Take 25 mg by mouth daily.   KETO PO Take 1 capsule by mouth.   losartan 100 MG tablet Commonly known as: COZAAR Take 100 mg by mouth daily.   metFORMIN 1000 MG tablet Commonly known as: GLUCOPHAGE Take 1,000 mg by mouth 2 (two) times daily.   naloxone 0.4 MG/ML injection Commonly known as: NARCAN Inject as directed as directed   NovoLOG FlexPen 100 UNIT/ML FlexPen Generic drug: insulin aspart Inject 40 Units into the skin 3 (three) times daily with meals. What changed: how much to take   Oxycodone HCl 20 MG Tabs Take 20 mg by mouth 4 (four) times daily as needed (pain).   sildenafil 20 MG tablet Commonly known as: REVATIO TAKE 2 TO 5 TABLETS BY MOUTH 1 HOUR PRIOR TO INTERCOURSE    simvastatin 40 MG tablet Commonly known as: ZOCOR Take 1 tablet (40 mg total) by mouth daily.   Testosterone 20.25 MG/ACT (1.62%) Gel APPLY 4 APPLICATIONS TOPICALLY DAILY.   Trulicity 1.5 MG/0.5ML Sopn Generic drug: Dulaglutide Inject 1.5 mg into the skin once a week.   vitamin C 250 MG tablet Commonly known as: ASCORBIC ACID Take 250 mg by mouth daily.               Discharge Care Instructions  (From admission, onward)           Start     Ordered   11/29/22 0000  Discharge wound care:       Comments: Follow with podiatry   11/29/22 1235            Follow-up Information     Rosetta Posner, DPM Follow up.   Specialty: Actuary information: [address] [place] [address] [phone]  Discharge Exam: Filed Weights   11/27/22 1038 11/28/22 1002  Weight: 112.9 kg 112.9 kg   General exam: Appears calm and comfortable  Respiratory system: Clear to auscultation. Respiratory effort normal. Cardiovascular system: S1 & S2 heard, RRR. No JVD, murmurs, rubs, gallops or clicks. No pedal edema. Gastrointestinal system: Abdomen is nondistended, soft and nontender. No organomegaly or masses felt. Normal bowel sounds heard. Central nervous system: Alert and oriented. No focal neurological deficits. Extremities: Symmetric 5 x 5 power. Skin: No rashes, lesions or ulcers Psychiatry: Judgement and insight appear normal. Mood & affect appropriate.    Condition at discharge: good  The results of significant diagnostics from this hospitalization (including imaging, microbiology, ancillary and laboratory) are listed below for reference.   Imaging Studies: PERIPHERAL VASCULAR CATHETERIZATION  Result Date: 11/28/2022 See surgical note for result.  DG Foot Complete Left  Result Date: 11/27/2022 CLINICAL DATA:  Left great toe gangrene. EXAM: LEFT FOOT - COMPLETE 3+ VIEW COMPARISON:  None Available. FINDINGS: Mild hallux valgus.  Mildly decreased bone mineralization. Moderate great toe soft tissue swelling. No definite cortical erosion is seen. No subcutaneous air. Joint spaces are preserved. No acute fracture dislocation. High-grade atherosclerotic calcifications. IMPRESSION: Moderate great toe soft tissue swelling. No definite cortical erosion is seen. No subcutaneous air. Electronically Signed   By: Neita Garnet M.D.   On: 11/27/2022 13:31    Microbiology: Results for orders placed or performed during the hospital encounter of 11/27/22  Blood culture (routine x 2)     Status: None (Preliminary result)   Collection Time: 11/27/22 11:40 AM   Specimen: BLOOD  Result Value Ref Range Status   Specimen Description BLOOD RIGTH HAND  Final   Special Requests   Final    BOTTLES DRAWN AEROBIC ONLY Blood Culture results may not be optimal due to an inadequate volume of blood received in culture bottles   Culture   Final    NO GROWTH 2 DAYS Performed at [facility], [address] [place], [place] [address]    Report Status PENDING  Incomplete  Blood culture (routine x 2)     Status: None (Preliminary result)   Collection Time: 11/27/22 11:40 AM   Specimen: BLOOD  Result Value Ref Range Status   Specimen Description BLOOD LEFT HAND  Final   Special Requests   Final    BOTTLES DRAWN AEROBIC ONLY Blood Culture results may not be optimal due to an inadequate volume of blood received in culture bottles   Culture   Final    NO GROWTH 2 DAYS Performed at [facility], [address] [place], [place] [address]    Report Status PENDING  Incomplete    Labs: CBC: Recent Labs  Lab 11/27/22 1051 11/28/22 0417  WBC 7.9 6.2  NEUTROABS 5.1  --   HGB 17.4* 14.3  HCT 50.1 40.7  MCV 91.4 90.0  PLT 229 177   Basic Metabolic Panel: Recent Labs  Lab 11/27/22 1051 11/28/22 0417  NA 128* 133*  K 4.7 4.2  CL 89* 103  CO2 25 23  GLUCOSE 470* 327*  BUN 47* 28*  CREATININE 2.02* 1.42*  CALCIUM 8.9  8.1*   Liver Function Tests: Recent Labs  Lab 11/27/22 1051  AST 12*  ALT 13  ALKPHOS 80  BILITOT 0.4  PROT 7.9  ALBUMIN 3.2*   CBG: Recent Labs  Lab 11/28/22 1704 11/28/22 2105 11/29/22 0722 11/29/22 0818 11/29/22 1134  GLUCAP 216* 214* 67* 79 359*  Discharge time spent: greater than 30 minutes.  Signed: Marrion Coy, MD [facility] 11/29/2022

## 2022-11-29 NOTE — H&P (Signed)
HISTORY AND PHYSICAL INTERVAL NOTE:  11/29/2022  7:20 AM  Corey Foster  has presented today for surgery, with the diagnosis of Gangrene left foot.  The various methods of treatment have been discussed with the patient.  No guarantees were given.  After consideration of risks, benefits and other options for treatment, the patient has consented to surgery.  I have reviewed the patients' chart and labs.    PROCEDURE: LEFT PARTIAL 1ST RAY AMPUTATION   A history and physical examination was performed in the hospital.  The patient was reexamined.  There have been no changes to this history and physical examination.  Rosetta Posner, DPM

## 2022-11-29 NOTE — Anesthesia Procedure Notes (Signed)
Date/Time: 11/29/2022 7:54 AM  Performed by: Irving Burton, CRNAOxygen Delivery Method: Simple face mask

## 2022-11-29 NOTE — Consult Note (Signed)
Pharmacy Antibiotic Note  Corey Foster is a 67 y.o. male admitted on 11/27/2022 with gangrene of left toe.  Pharmacy has been consulted for vancomycin and cefepime dosing.  S/p toe amputation 11/29/22  Plan: Scr improved 2.02>>1.42  Will adjust vancomycin from 1250 mg to 2000 mg IV every 24 hours Estimated AUC 501, Cmin 12.1 IBW, Scr 1.42, Vd 0.72 (BMI 34) Vancomycin levels as clinically indicated Continue cefepime 2 grams IV Q8h  for crcl 64.5 ml/min Flagyl 500 mg IV every 12 hours per provider     Height: 5\' 11"  (180.3 cm) Weight: 112.9 kg (249 lb) IBW/kg (Calculated) : 75.3  Temp (24hrs), Avg:98.2 F (36.8 C), Min:97.2 F (36.2 C), Max:98.9 F (37.2 C)  Recent Labs  Lab 11/27/22 1051 11/27/22 1349 11/28/22 0417  WBC 7.9  --  6.2  CREATININE 2.02*  --  1.42*  LATICACIDVEN 2.0* 1.4  --     Estimated Creatinine Clearance: 64.5 mL/min (A) (by C-G formula based on SCr of 1.42 mg/dL (H)).    Allergies  Allergen Reactions   Nsaids Other (See Comments)    AKI AKI     Antimicrobials this admission: Vancomycin 10/3 >> cefepime 10/3 >>  Flagyl 10/3>>  Dose adjustments this admission: N/A  Microbiology results: 10/3 BCx: NG 2d  Thank you for allowing pharmacy to be a part of this patient's care.  Christiona Siddique A, PharmD 11/29/2022 9:43 AM

## 2022-11-29 NOTE — Op Note (Signed)
PODIATRY / FOOT AND ANKLE SURGERY OPERATIVE REPORT    SURGEON: Rosetta Posner, DPM  PRE-OPERATIVE DIAGNOSIS:  1.  Left hallux gangrene  POST-OPERATIVE DIAGNOSIS: Same  PROCEDURE(S): Left hallux amputation  HEMOSTASIS: No tourniquet  ANESTHESIA: MAC  ESTIMATED BLOOD LOSS: 20 cc  FINDING(S): 1.  Left hallux gangrene  PATHOLOGY/SPECIMEN(S): Left hallux proximal margin purple ink  INDICATIONS:   Corey Foster is a 67 y.o. male who presents with gangrene to the left hallux.  Patient had a CT angiogram performed with intervention by vascular surgery to improve circulation to left lower extremity while admitted to the hospital.  Patient now presents today for amputation of the left hallux or potentially partial first ray amputation depending on circulation seen intraoperatively.  All treatment options were discussed with the patient both conservative and surgical attempts at correction clean potential risks and complications at this time patient is elected for surgical intervention today.  No guarantees given.  Consent obtained prior to procedure.  Patient remains at high risk for limb loss due to severe PVD.[name]  DESCRIPTION: After obtaining full informed written consent, the patient was brought back to the operating room and placed supine upon the operating table.  The patient received IV antibiotics prior to induction.  After obtaining adequate anesthesia, the patient was prepped and draped in the standard fashion.  20 cc of half percent Marcaine plain was injected about the left first ray in a Mayo type block.  No tourniquet was used for the procedure.  Attention was then directed to the left hallux where a racquet type of incision was made slightly distal to the first metatarsal phalange joint with the arm of the racquet extending at the dorsal medial surface of the first metatarsal phalange joint area.  The incision was made straight to bone.  An extensor tenotomy and capsulotomy was  performed followed by release the collateral and suspensory ligaments as well as the plantar plate and attachment to the flexor tendon.  The hallux was disarticulated and passed off the operative site.  The proximal margin of the amputation site was marked in purple ink and sent off to pathology as left hallux proximal margin purple ink.  There appeared to be some mild bleeding overall but did not appear to be a robust amount of bleeding.  Hemostasis was achieved with electrocauterization.  Tissues appeared to be fairly healthy and viable still at this time.  The surgical site was flushed with copious amounts normal sterile saline.  The skin was then reapproximated well coapted with 3-0 nylon in a combination of simple and horizontal mattress type stitching.  A postoperative dressing was then applied consisting of Xeroform followed by 4 x 4 gauze, ABD, Kerlix, Ace wrap.  The patient tolerated the procedure and anesthesia well and was transferred to the recovery in vital signs stable vascular status intact all toes left foot.  Following a period of postoperative monitoring the patient will be discharged back to the inpatient room with appropriate orders, medications, and instructions.  Believe that patient can be discharged when medically stable.  Would like to follow back up with patient in 1 week after discharge date for dressing change.  Patient to remain partial weightbearing with heel contact and postop shoe.  COMPLICATIONS: None  CONDITION: Good, stable  Rosetta Posner, DPM

## 2022-11-29 NOTE — Plan of Care (Signed)

## 2022-11-29 NOTE — Anesthesia Preprocedure Evaluation (Signed)
Anesthesia Evaluation  Patient identified by MRN, date of birth, ID band Patient awake    Reviewed: Allergy & Precautions, NPO status , Patient's Chart, lab work & pertinent test results  History of Anesthesia Complications Negative for: history of anesthetic complications  Airway Mallampati: III  TM Distance: >3 FB Neck ROM: Full    Dental  (+) Partial Upper, Poor Dentition, Missing   Pulmonary sleep apnea and Continuous Positive Airway Pressure Ventilation , neg COPD, Current Smoker and Patient abstained from smoking.   Pulmonary exam normal breath sounds clear to auscultation       Cardiovascular Exercise Tolerance: Good METShypertension, Pt. on medications (-) CAD and (-) Past MI (-) dysrhythmias  Rhythm:Regular Rate:Normal - Systolic murmurs    Neuro/Psych  PSYCHIATRIC DISORDERS Anxiety     negative neurological ROS     GI/Hepatic ,GERD  Medicated and Controlled,,(+)     (-) substance abuse    Endo/Other  diabetes, Insulin Dependent  Last GLP1 agonist dose was 7 days ago. Denies GI symptoms today  Renal/GU CRFRenal disease     Musculoskeletal  (+) Arthritis ,    Abdominal  (+) + obese  Peds  Hematology   Anesthesia Other Findings Past Medical History: No date: Arthritis No date: Diabetes mellitus without complication (HCC) No date: DJD (degenerative joint disease) No date: GERD (gastroesophageal reflux disease) No date: Hyperlipidemia No date: Hypertension No date: Sciatic nerve pain 06/30/2014: Secondary erythrocytosis No date: Sleep apnea  Reproductive/Obstetrics                             Anesthesia Physical Anesthesia Plan  ASA: 3  Anesthesia Plan: General   Post-op Pain Management: Ofirmev IV (intra-op)*   Induction: Intravenous  PONV Risk Score and Plan: 1 and Propofol infusion, TIVA, Ondansetron, Dexamethasone and Midazolam  Airway Management Planned: Nasal  Cannula  Additional Equipment: None  Intra-op Plan:   Post-operative Plan:   Informed Consent: I have reviewed the patients History and Physical, chart, labs and discussed the procedure including the risks, benefits and alternatives for the proposed anesthesia with the patient or authorized representative who has indicated his/her understanding and acceptance.     Dental advisory given  Plan Discussed with: CRNA and Surgeon  Anesthesia Plan Comments: (Discussed risks of anesthesia with patient, including possibility of difficulty with spontaneous ventilation under anesthesia necessitating airway intervention, PONV, and rare risks such as cardiac or respiratory or neurological events, and allergic reactions. Discussed the role of CRNA in patient's perioperative care. Patient understands.)       Anesthesia Quick Evaluation

## 2022-11-29 NOTE — Progress Notes (Signed)
Subjective  - POD #1, s/p PTA, Left PT  Toe amp this am   Physical Exam:  Right groin soft without hematoma       Assessment/Plan:  POD #1  Stable for discharge Recommend ASA, Plavix, statin at discharge  Corey Foster 11/29/2022 1:32 PM --  Vitals:   11/29/22 0845 11/29/22 1133  BP: (!) 133/98 (!) 171/99  Pulse: 71 98  Resp: 16 18  Temp: (!) 97.5 F (36.4 C) 98.1 F (36.7 C)  SpO2: 99% 99%    Intake/Output Summary (Last 24 hours) at 11/29/2022 1332 Last data filed at 11/29/2022 1146 Gross per 24 hour  Intake 450 ml  Output 2200 ml  Net -1750 ml     Laboratory CBC    Component Value Date/Time   WBC 6.2 11/28/2022 0417   HGB 14.3 11/28/2022 0417   HGB 16.4 08/02/2013 0916   HCT 40.7 11/28/2022 0417   HCT 47.1 03/27/2020 1012   PLT 177 11/28/2022 0417   PLT 205 08/02/2013 0916    BMET    Component Value Date/Time   NA 133 (L) 11/28/2022 0417   K 4.2 11/28/2022 0417   CL 103 11/28/2022 0417   CO2 23 11/28/2022 0417   GLUCOSE 327 (H) 11/28/2022 0417   BUN 28 (H) 11/28/2022 0417   CREATININE 1.42 (H) 11/28/2022 0417   CALCIUM 8.1 (L) 11/28/2022 0417   GFRNONAA 54 (L) 11/28/2022 0417   GFRAA 44 (L) 04/15/2019 1432    COAG Lab Results  Component Value Date   INR 1.0 02/07/2021   No results found for: "PTT"  Antibiotics Anti-infectives (From admission, onward)    Start     Dose/Rate Route Frequency Ordered Stop   11/29/22 1100  vancomycin (VANCOREADY) IVPB 2000 mg/400 mL        2,000 mg 200 mL/hr over 120 Minutes Intravenous Every 24 hours 11/29/22 0951 12/04/22 1059   11/29/22 0600  ceFAZolin (ANCEF) IVPB 2g/100 mL premix        2 g 200 mL/hr over 30 Minutes Intravenous On call to O.R. 11/28/22 1943 11/29/22 0758   11/28/22 2200  ceFEPIme (MAXIPIME) 2 g in sodium chloride 0.9 % 100 mL IVPB        2 g 200 mL/hr over 30 Minutes Intravenous Every 8 hours 11/28/22 1554 12/04/22 2159   11/28/22 1500  vancomycin (VANCOREADY) IVPB 1250  mg/250 mL  Status:  Discontinued        1,250 mg 166.7 mL/hr over 90 Minutes Intravenous Every 24 hours 11/27/22 1406 11/29/22 0951   11/28/22 0906  ceFAZolin (ANCEF) IVPB 2g/100 mL premix  Status:  Discontinued        2 g 200 mL/hr over 30 Minutes Intravenous 30 min pre-op 11/28/22 0906 11/28/22 0917   11/28/22 0000  ceFEPIme (MAXIPIME) 2 g in sodium chloride 0.9 % 100 mL IVPB  Status:  Discontinued        2 g 200 mL/hr over 30 Minutes Intravenous Every 12 hours 11/27/22 1252 11/28/22 1554   11/27/22 1400  vancomycin (VANCOREADY) IVPB 500 mg/100 mL        500 mg 100 mL/hr over 60 Minutes Intravenous  Once 11/27/22 1252 11/27/22 1719   11/27/22 1145  ceFEPIme (MAXIPIME) 2 g in sodium chloride 0.9 % 100 mL IVPB        2 g 200 mL/hr over 30 Minutes Intravenous  Once 11/27/22 1137 11/27/22 1245   11/27/22 1145  metroNIDAZOLE (FLAGYL) IVPB 500 mg  500 mg 100 mL/hr over 60 Minutes Intravenous  Once 11/27/22 1137 11/27/22 1400   11/27/22 1145  vancomycin (VANCOCIN) IVPB 1000 mg/200 mL premix  Status:  Discontinued        1,000 mg 200 mL/hr over 60 Minutes Intravenous  Once 11/27/22 1137 11/27/22 1138   11/27/22 1145  vancomycin (VANCOREADY) IVPB 2000 mg/400 mL        2,000 mg 200 mL/hr over 120 Minutes Intravenous  Once 11/27/22 1139 11/27/22 1612        V. Charlena Cross, M.D., [facility] [place] Office: [phone] Pager:  [phone]

## 2022-11-29 NOTE — Anesthesia Postprocedure Evaluation (Signed)
Anesthesia Post Note  Patient: Corey Foster  Procedure(s) Performed: AMPUTATION RAY, PARTIAL 1ST RAY (Left)  Patient location during evaluation: PACU Anesthesia Type: General Level of consciousness: awake and alert Pain management: pain level controlled Vital Signs Assessment: post-procedure vital signs reviewed and stable Respiratory status: spontaneous breathing, nonlabored ventilation, respiratory function stable and patient connected to nasal cannula oxygen Cardiovascular status: blood pressure returned to baseline and stable Postop Assessment: no apparent nausea or vomiting Anesthetic complications: no   No notable events documented.   Last Vitals:  Vitals:   11/29/22 0830 11/29/22 0845  BP: 109/66 (!) 133/98  Pulse: 79 71  Resp:  16  Temp:  (!) 36.4 C  SpO2: 99% 99%    Last Pain:  Vitals:   11/29/22 0845  TempSrc:   PainSc: 0-No pain                 Corinda Gubler

## 2022-11-29 NOTE — Transfer of Care (Signed)
Immediate Anesthesia Transfer of Care Note  Patient: Corey Foster  Procedure(s) Performed: AMPUTATION RAY, PARTIAL 1ST RAY (Left)  Patient Location: PACU  Anesthesia Type:General  Level of Consciousness: awake  Airway & Oxygen Therapy: Patient Spontanous Breathing and Patient connected to face mask oxygen  Post-op Assessment: Report given to RN and Post -op Vital signs reviewed and stable  Post vital signs: Reviewed and stable  Last Vitals:  Vitals Value Taken Time  BP    Temp    Pulse    Resp    SpO2      Last Pain:  Vitals:   11/29/22 0600  TempSrc: Oral  PainSc:          Complications: No notable events documented.

## 2022-11-29 NOTE — Discharge Instructions (Addendum)
[facility]  POST OPERATIVE INSTRUCTIONS FOR DR. Ether Griffins AND DR. BAKER [facility] PODIATRY DEPARTMENT   Take your medication as prescribed.  Pain medication should be taken only as needed.  Keep the dressing clean, dry and intact.  Remain partial weightbearing with heel contact in postoperative shoe when ambulating.  Try not to be on your left foot for long periods of time.  Walking to the bathroom and brief periods of walking are acceptable, unless we have instructed you to be non-weight bearing.  Always wear your post-op shoe when walking.  Always use your crutches if you are to be non-weight bearing.  Do not take a shower. Baths are permissible as long as the foot is kept out of the water.   Every hour you are awake:  Bend your knee 15 times. Flex foot 15 times Massage calf 15 times  Call [facility] [phone]) if any of the following problems occur: You develop a temperature or fever. The bandage becomes saturated with blood. Medication does not stop your pain. Injury of the foot occurs. Any symptoms of infection including redness, odor, or red streaks running from wound.  For diabetes. Your short acting insulin dose was reduced for concern about hypoglycemia.  May increase your short acting insulin back to home dose if glucose is running still high tomorrow.  Follow-up with PCP ASAP.

## 2022-11-30 ENCOUNTER — Encounter: Payer: Self-pay | Admitting: Podiatry

## 2022-12-01 ENCOUNTER — Encounter: Payer: Self-pay | Admitting: Vascular Surgery

## 2022-12-01 LAB — HEMOGLOBIN A1C
Hgb A1c MFr Bld: >15.5 % — ABNORMAL HIGH (ref 4.8–5.6)
Mean Plasma Glucose: >398 mg/dL

## 2022-12-02 LAB — CULTURE, BLOOD (ROUTINE X 2)
Culture: NO GROWTH
Culture: NO GROWTH

## 2022-12-04 LAB — SURGICAL PATHOLOGY

## 2022-12-08 ENCOUNTER — Encounter: Payer: Self-pay | Admitting: *Deleted

## 2022-12-08 ENCOUNTER — Emergency Department: Payer: Medicare HMO

## 2022-12-08 ENCOUNTER — Observation Stay
Admission: EM | Admit: 2022-12-08 | Discharge: 2022-12-09 | Disposition: A | Discharge status: Home / Self Care | Payer: Medicare HMO | Attending: Internal Medicine | Admitting: Internal Medicine

## 2022-12-08 ENCOUNTER — Other Ambulatory Visit: Payer: Self-pay

## 2022-12-08 DIAGNOSIS — I10 Essential (primary) hypertension: Secondary | ICD-10-CM | POA: Diagnosis present

## 2022-12-08 DIAGNOSIS — E1165 Type 2 diabetes mellitus with hyperglycemia: Secondary | ICD-10-CM | POA: Diagnosis not present

## 2022-12-08 DIAGNOSIS — Z7985 Long-term (current) use of injectable non-insulin antidiabetic drugs: Secondary | ICD-10-CM | POA: Insufficient documentation

## 2022-12-08 DIAGNOSIS — Z7984 Long term (current) use of oral hypoglycemic drugs: Secondary | ICD-10-CM | POA: Insufficient documentation

## 2022-12-08 DIAGNOSIS — E1122 Type 2 diabetes mellitus with diabetic chronic kidney disease: Secondary | ICD-10-CM | POA: Diagnosis not present

## 2022-12-08 DIAGNOSIS — Z794 Long term (current) use of insulin: Secondary | ICD-10-CM | POA: Diagnosis not present

## 2022-12-08 DIAGNOSIS — Z7982 Long term (current) use of aspirin: Secondary | ICD-10-CM | POA: Diagnosis not present

## 2022-12-08 DIAGNOSIS — E1129 Type 2 diabetes mellitus with other diabetic kidney complication: Secondary | ICD-10-CM | POA: Diagnosis present

## 2022-12-08 DIAGNOSIS — Z6834 Body mass index (BMI) 34.0-34.9, adult: Secondary | ICD-10-CM | POA: Diagnosis not present

## 2022-12-08 DIAGNOSIS — E669 Obesity, unspecified: Secondary | ICD-10-CM | POA: Insufficient documentation

## 2022-12-08 DIAGNOSIS — N189 Chronic kidney disease, unspecified: Secondary | ICD-10-CM | POA: Insufficient documentation

## 2022-12-08 DIAGNOSIS — Z89412 Acquired absence of left great toe: Secondary | ICD-10-CM | POA: Insufficient documentation

## 2022-12-08 DIAGNOSIS — I129 Hypertensive chronic kidney disease with stage 1 through stage 4 chronic kidney disease, or unspecified chronic kidney disease: Secondary | ICD-10-CM | POA: Diagnosis not present

## 2022-12-08 DIAGNOSIS — F17210 Nicotine dependence, cigarettes, uncomplicated: Secondary | ICD-10-CM | POA: Insufficient documentation

## 2022-12-08 DIAGNOSIS — M79672 Pain in left foot: Secondary | ICD-10-CM | POA: Diagnosis present

## 2022-12-08 DIAGNOSIS — Z79899 Other long term (current) drug therapy: Secondary | ICD-10-CM | POA: Diagnosis not present

## 2022-12-08 DIAGNOSIS — E11649 Type 2 diabetes mellitus with hypoglycemia without coma: Secondary | ICD-10-CM | POA: Diagnosis not present

## 2022-12-08 DIAGNOSIS — R6882 Decreased libido: Secondary | ICD-10-CM | POA: Diagnosis not present

## 2022-12-08 DIAGNOSIS — Z89422 Acquired absence of other left toe(s): Secondary | ICD-10-CM | POA: Insufficient documentation

## 2022-12-08 DIAGNOSIS — G8929 Other chronic pain: Secondary | ICD-10-CM | POA: Diagnosis not present

## 2022-12-08 DIAGNOSIS — E785 Hyperlipidemia, unspecified: Secondary | ICD-10-CM | POA: Diagnosis present

## 2022-12-08 DIAGNOSIS — E162 Hypoglycemia, unspecified: Secondary | ICD-10-CM | POA: Insufficient documentation

## 2022-12-08 DIAGNOSIS — T8149XA Infection following a procedure, other surgical site, initial encounter: Secondary | ICD-10-CM | POA: Diagnosis present

## 2022-12-08 DIAGNOSIS — M79671 Pain in right foot: Principal | ICD-10-CM

## 2022-12-08 DIAGNOSIS — R739 Hyperglycemia, unspecified: Secondary | ICD-10-CM | POA: Diagnosis present

## 2022-12-08 DIAGNOSIS — M51379 Other intervertebral disc degeneration, lumbosacral region without mention of lumbar back pain or lower extremity pain: Secondary | ICD-10-CM | POA: Diagnosis present

## 2022-12-08 DIAGNOSIS — T148XXA Other injury of unspecified body region, initial encounter: Secondary | ICD-10-CM

## 2022-12-08 LAB — COMPREHENSIVE METABOLIC PANEL
ALT: 17 U/L (ref 0–44)
AST: 15 U/L (ref 15–41)
Albumin: 3.2 g/dL — ABNORMAL LOW (ref 3.5–5.0)
Alkaline Phosphatase: 68 U/L (ref 38–126)
Anion gap: 10 (ref 5–15)
BUN: 15 mg/dL (ref 8–23)
CO2: 23 mmol/L (ref 22–32)
Calcium: 8.7 mg/dL — ABNORMAL LOW (ref 8.9–10.3)
Chloride: 104 mmol/L (ref 98–111)
Creatinine, Ser: 1.15 mg/dL (ref 0.61–1.24)
GFR, Estimated: >60 mL/min (ref >60)
Glucose, Bld: 175 mg/dL — ABNORMAL HIGH (ref 70–99)
Potassium: 4.3 mmol/L (ref 3.5–5.1)
Sodium: 137 mmol/L (ref 135–145)
Total Bilirubin: 0.3 mg/dL (ref 0.3–1.2)
Total Protein: 7.3 g/dL (ref 6.5–8.1)

## 2022-12-08 LAB — CBC WITH DIFFERENTIAL/PLATELET
Abs Immature Granulocytes: 0.06 10*3/uL (ref 0.00–0.07)
Basophils Absolute: 0 10*3/uL (ref 0.0–0.1)
Basophils Relative: 0 %
Eosinophils Absolute: 0.1 10*3/uL (ref 0.0–0.5)
Eosinophils Relative: 1 %
HCT: 47.7 % (ref 39.0–52.0)
Hemoglobin: 16 g/dL (ref 13.0–17.0)
Immature Granulocytes: 1 %
Lymphocytes Relative: 18 %
Lymphs Abs: 1.4 10*3/uL (ref 0.7–4.0)
MCH: 32 pg (ref 26.0–34.0)
MCHC: 33.5 g/dL (ref 30.0–36.0)
MCV: 95.4 fL (ref 80.0–100.0)
Monocytes Absolute: 0.6 10*3/uL (ref 0.1–1.0)
Monocytes Relative: 8 %
Neutro Abs: 5.3 10*3/uL (ref 1.7–7.7)
Neutrophils Relative %: 72 %
Platelets: 193 10*3/uL (ref 150–400)
RBC: 5 MIL/uL (ref 4.22–5.81)
RDW: 13.5 % (ref 11.5–15.5)
Smear Review: NORMAL
WBC: 7.4 10*3/uL (ref 4.0–10.5)
nRBC: 0 % (ref 0.0–0.2)

## 2022-12-08 LAB — LACTIC ACID, PLASMA: Lactic Acid, Venous: 1.2 mmol/L (ref 0.5–1.9)

## 2022-12-08 LAB — SEDIMENTATION RATE: Sed Rate: 9 mm/h (ref 0–20)

## 2022-12-08 LAB — GLUCOSE, CAPILLARY
Glucose-Capillary: 63 mg/dL — ABNORMAL LOW (ref 70–99)
Glucose-Capillary: 68 mg/dL — ABNORMAL LOW (ref 70–99)
Glucose-Capillary: 129 mg/dL — ABNORMAL HIGH (ref 70–99)

## 2022-12-08 MED ORDER — HYDROMORPHONE HCL 1 MG/ML IJ SOLN
1.0000 mg | INTRAMUSCULAR | Status: DC | PRN
  Administered 2022-12-09: 1 mg via INTRAVENOUS
  Filled 2022-12-08: qty 1

## 2022-12-08 MED ORDER — SODIUM CHLORIDE 0.9 % IV SOLN
2.0000 g | Freq: Once | INTRAVENOUS | Status: AC
  Administered 2022-12-08: 2 g via INTRAVENOUS
  Filled 2022-12-08: qty 20

## 2022-12-08 MED ORDER — ACETAMINOPHEN 650 MG RE SUPP: 650.0000 mg | Freq: Four times a day (QID) | RECTAL | Status: DC | PRN

## 2022-12-08 MED ORDER — MORPHINE SULFATE (PF) 2 MG/ML IV SOLN: 2.0000 mg | INTRAVENOUS | Status: DC | PRN

## 2022-12-08 MED ORDER — INSULIN GLARGINE-YFGN 100 UNIT/ML ~~LOC~~ SOLN: 80.0000 [IU] | Freq: Every day | SUBCUTANEOUS | Status: DC

## 2022-12-08 MED ORDER — ACETAMINOPHEN 325 MG PO TABS: 650.0000 mg | ORAL_TABLET | Freq: Four times a day (QID) | ORAL | Status: DC | PRN

## 2022-12-08 MED ORDER — HYDRALAZINE HCL 10 MG PO TABS: 10.0000 mg | ORAL_TABLET | Freq: Four times a day (QID) | ORAL | Status: DC | PRN

## 2022-12-08 MED ORDER — DEXTROSE 50 % IV SOLN: 1.0000 | INTRAVENOUS | Status: DC | PRN

## 2022-12-08 MED ORDER — MORPHINE SULFATE (PF) 4 MG/ML IV SOLN
4.0000 mg | INTRAVENOUS | Status: DC | PRN
  Filled 2022-12-08: qty 1

## 2022-12-08 MED ORDER — OXYCODONE HCL 20 MG PO TABS: 20.0000 mg | ORAL_TABLET | Freq: Four times a day (QID) | ORAL | Status: DC | PRN

## 2022-12-08 MED ORDER — INSULIN ASPART 100 UNIT/ML IJ SOLN
0.0000 [IU] | Freq: Three times a day (TID) | INTRAMUSCULAR | Status: DC
  Administered 2022-12-09: 2 [IU] via SUBCUTANEOUS
  Filled 2022-12-08: qty 1

## 2022-12-08 MED ORDER — ONDANSETRON HCL 4 MG PO TABS: 4.0000 mg | ORAL_TABLET | Freq: Four times a day (QID) | ORAL | Status: DC | PRN

## 2022-12-08 MED ORDER — INSULIN GLARGINE-YFGN 100 UNIT/ML ~~LOC~~ SOLN
80.0000 [IU] | SUBCUTANEOUS | Status: DC
Start: 2022-12-08 — End: 2022-12-08

## 2022-12-08 MED ORDER — SODIUM CHLORIDE 0.9 % IV SOLN
2.0000 g | INTRAVENOUS | Status: DC
  Filled 2022-12-08: qty 20

## 2022-12-08 MED ORDER — SIMVASTATIN 20 MG PO TABS: 40.0000 mg | ORAL_TABLET | Freq: Every day | ORAL | Status: DC

## 2022-12-08 MED ORDER — VANCOMYCIN HCL 1750 MG/350ML IV SOLN
1750.0000 mg | Freq: Once | INTRAVENOUS | Status: AC
  Administered 2022-12-08: 1750 mg via INTRAVENOUS
  Filled 2022-12-08: qty 350

## 2022-12-08 MED ORDER — INSULIN ASPART 100 UNIT/ML IJ SOLN: 0.0000 [IU] | Freq: Every day | INTRAMUSCULAR | Status: DC

## 2022-12-08 MED ORDER — ONDANSETRON HCL 4 MG/2ML IJ SOLN: 4.0000 mg | Freq: Four times a day (QID) | INTRAMUSCULAR | Status: DC | PRN

## 2022-12-08 MED ORDER — ALBUTEROL SULFATE (2.5 MG/3ML) 0.083% IN NEBU: 2.5000 mg | INHALATION_SOLUTION | Freq: Four times a day (QID) | RESPIRATORY_TRACT | Status: DC | PRN

## 2022-12-08 MED ORDER — INSULIN GLARGINE-YFGN 100 UNIT/ML ~~LOC~~ SOLN: 120.0000 [IU] | Freq: Every day | SUBCUTANEOUS | Status: DC

## 2022-12-08 MED ORDER — VITAMIN C 500 MG PO TABS
250.0000 mg | ORAL_TABLET | Freq: Every day | ORAL | Status: DC
  Administered 2022-12-09: 250 mg via ORAL
  Filled 2022-12-08: qty 1

## 2022-12-08 MED ORDER — VANCOMYCIN HCL IN DEXTROSE 1-5 GM/200ML-% IV SOLN
1000.0000 mg | Freq: Two times a day (BID) | INTRAVENOUS | Status: DC
  Administered 2022-12-09: 1000 mg via INTRAVENOUS
  Filled 2022-12-08 (×2): qty 200

## 2022-12-08 MED ORDER — HEPARIN SODIUM (PORCINE) 5000 UNIT/ML IJ SOLN
5000.0000 [IU] | Freq: Three times a day (TID) | INTRAMUSCULAR | Status: AC
  Administered 2022-12-08: 5000 [IU] via SUBCUTANEOUS
  Filled 2022-12-08: qty 1

## 2022-12-08 NOTE — Assessment & Plan Note (Signed)
Wound infection cannot be excluded at this time, reassuring that patient's lactic acid is within normal limits and WBC is not elevated Ceftriaxone 2 g IV daily, 4 additional doses ordered starting on 12/09/2022, vancomycin per pharmacy Podiatry has been consulted by EDP, Dr. Alberteen Spindle who states the patient will be seen Admit to telemetry medical, inpatient

## 2022-12-08 NOTE — H&P (Signed)
History and Physical   [name] MWU:[id number] DOB: 1955-10-18 DOA: 12/08/2022  PCP: Emogene Morgan, MD  Outpatient Specialists: Dr. Lonna Cobb, urology Patient coming from: Home  I have personally briefly reviewed patient's old medical records in [facility] EMR.  Chief Concern: Difficulty walking, left foot pain  HPI: [name] is a 67 year old male with history of insulin-dependent diabetes mellitus 2, hyperlipidemia, hypertension, who presents emergency department for chief concerns of difficulty walking, left foot pain.  Vitals in the ED showed temperature of 98.7, respiration rate of 20, heart rate of 92 4, blood pressure 129/81, SpO2 99% on room air.  Serum sodium is 137, potassium 4.3, chloride 104, bicarb 23, BUN of 15, serum creatinine 1.15, EGFR greater than 60, nonfasting blood glucose 175, WBC 7.4, hemoglobin 16, platelets of 193.  Lactic acid is 1.2.  Left foot x-ray: Was read as interval amputation of the first digit at the level of the MTP joint.  No definite acute osseous abnormality.  ED treatment: Ceftriaxone 2 g IV one-time dose, vancomycin per pharmacy. --------------------------------- At bedside, he is able to tell his full name, current year, current location, his age.  He reports that since he has been discharged from the hospital, his left foot has been hurting him and he has had difficulty ambulating.  He reports that he was discharged without instructions on how to take care of his foot.  He reports he was not given referral to wound clinic.  He reports that when he did open his dressing at an outpatient facility, he was told it had pus and that he should get it evaluated in the emergency department.  Social history: He lives at home at home on his own. He smokes 5-6 cigarettes per day. He drinks etoh socially. He denies recreational drug use.  He is retired.  ROS: Constitutional: no weight change, no fever ENT/Mouth: no sore throat, no  rhinorrhea Eyes: no eye pain, no vision changes Cardiovascular: no chest pain, no dyspnea,  no edema, no palpitations Respiratory: no cough, no sputum, no wheezing Gastrointestinal: no nausea, no vomiting, no diarrhea, no constipation Genitourinary: no urinary incontinence, no dysuria, no hematuria Musculoskeletal: no arthralgias, no myalgias Skin: no skin lesions, no pruritus, + left toe purulence and pain Neuro: + weakness, no loss of consciousness, no syncope Psych: no anxiety, no depression, no decrease appetite Heme/Lymph: no bruising, no bleeding  ED Course: Discussed with emergency medicine provider, patient requiring hospitalization for chief concerns of possible left first toe reinfection.  Assessment/Plan  Principal Problem:   Left foot pain Active Problems:   Type II diabetes mellitus with renal manifestations (HCC)   DDD (degenerative disc disease), lumbosacral   Essential hypertension   Decreased libido   HLD (hyperlipidemia)   Hyperglycemia   Hypoglycemia   Assessment and Plan:  * Left foot pain Wound infection cannot be excluded at this time, reassuring that patient's lactic acid is within normal limits and WBC is not elevated Ceftriaxone 2 g IV daily, 4 additional doses ordered starting on 12/09/2022, vancomycin per pharmacy Podiatry has been consulted by EDP, Dr. Alberteen Spindle who states the patient will be seen Admit to telemetry medical, inpatient  Type II diabetes mellitus with renal manifestations (HCC) Home long-acting insulin dosing resumed on admission Insulin SSI with at bedtime coverage ordered Heart healthy/carb modified diet ordered Goal inpatient blood glucose levels 140-180  Hypoglycemia Patient had 2 readings of hypoglycemia of 68 and 63 followed by 129 Continue insulin SSI with at bedtime  coverage ordered Home long-acting insulin not resumed on admission, a.m. team to resume when the benefits outweigh the risk  Hyperglycemia Secondary to  diabetes, treat per above  HLD (hyperlipidemia) Home simvastatin 40 mg nightly resumed  Essential hypertension Blood pressure is 129/81, currently controlled Hydralazine 10 mg p.o. every 6 hours as needed for SBP greater 170, 5 days ordered  Chart reviewed.   DVT prophylaxis: Heparin 5000 units subcutaneous one-time dose ordered; AM team to reinitiate pharmacologic DVT prophylaxis when the benefits outweigh the risk Code Status: Full code Diet: Heart healthy/carb modified Family Communication: Patient's daughter has already been updated by EDP Disposition Plan: Pending clinical course Consults called: Podiatry, Dr. Alberteen Spindle via EDP Admission status: Telemetry medical, inpatient  Past Medical History:  Diagnosis Date   Arthritis    Diabetes mellitus without complication (HCC)    DJD (degenerative joint disease)    GERD (gastroesophageal reflux disease)    Hyperlipidemia    Hypertension    Sciatic nerve pain    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   Sleep apnea    Past Surgical History:  Procedure Laterality Date   AMPUTATION Left 11/29/2022   Procedure: AMPUTATION RAY, PARTIAL 1ST RAY;  Surgeon: Rosetta Posner, DPM;  Location: [facility] ORS;  Service: Orthopedics/Podiatry;  Laterality: Left;   CYSTOSCOPY W/ RETROGRADES Bilateral 05/31/2019   Procedure: CYSTOSCOPY WITH RETROGRADE PYELOGRAM;  Surgeon: Riki Altes, MD;  Location: [facility] ORS;  Service: Urology;  Laterality: Bilateral;   LOWER EXTREMITY ANGIOGRAPHY Left 11/28/2022   Procedure: Lower Extremity Angiography;  Surgeon: Annice Needy, MD;  Location: [facility] INVASIVE [facility];  Service: Cardiovascular;  Laterality: Left;   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-C N/A 05/31/2019   Procedure: 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 He has a 10 pack-year  smoking history. He has never used smokeless tobacco. He reports current alcohol use of about 18.0 standard drinks of alcohol per week. He reports that he does not currently use drugs after having used the following drugs: Cocaine and Marijuana.  Allergies  Allergen Reactions   Nsaids Other (See Comments)    AKI AKI    Family History  Problem Relation Age of Onset   Diabetes Mother    Heart disease Mother    Diabetes Father    Heart disease Father    Diabetes Brother    Family history: Family history reviewed and not pertinent,  Prior to Admission medications   Medication Sig Start Date End Date Taking? Authorizing Provider  albuterol (VENTOLIN HFA) 108 (90 Base) MCG/ACT inhaler Inhale 1 puff into the lungs every 6 (six) hours as needed for wheezing or shortness of breath.  02/15/13   [provider]  ALPRAZolam Prudy Feeler) 1 MG tablet Take 1 mg by mouth 4 (four) times daily as needed. 08/27/19   [provider]  AMBULATORY NON FORMULARY MEDICATION Trimix (30/1/10)-(Pap/Phent/PGE)  Dosage: Inject 0.3 cc and may increase 0.1cc to achieve and erection lasting no longer than 1 hour per injection   Vial 1ml  Qty #5 refills 2  [facility] [phone] Fax [phone] 02/13/22   Riki Altes, MD  aspirin EC 325 MG tablet Take 325 mg by mouth daily.    [provider]  baclofen (LIORESAL) 10 MG tablet Take 10 mg by mouth 2 (two) times daily.  10/07/13   [provider]  cholecalciferol (VITAMIN D) 400 UNITS TABS tablet Take 10,000 Units by mouth daily.     [provider]  furosemide (LASIX) 20 MG tablet Take 20 mg by mouth daily. 01/15/21   [provider]  insulin glargine (LANTUS) 100 UNIT/ML injection Inject 80 Units into the skin daily. 80 units in the am and 120 units at bedtime 01/14/12   [provider]  JARDIANCE 25 MG TABS tablet Take 25 mg by mouth daily. 06/21/21   [provider]  losartan  (COZAAR) 100 MG tablet Take 100 mg by mouth daily.  09/25/17   [provider]  metFORMIN (GLUCOPHAGE) 1000 MG tablet Take 1,000 mg by mouth 2 (two) times daily. 06/17/21   [provider]  naloxone (NARCAN) 0.4 MG/ML injection Inject as directed as directed    [provider]  NOVOLOG FLEXPEN 100 UNIT/ML FlexPen Inject 40 Units into the skin 3 (three) times daily with meals. 11/29/22   Marrion Coy, MD  Nutritional Supplements (KETO PO) Take 1 capsule by mouth.    [provider]  Omega-3 Fatty Acids (FISH OIL) 1000 MG CAPS Take 1 capsule by mouth daily.  01/14/12   [provider]  Oxycodone HCl 20 MG TABS Take 20 mg by mouth 4 (four) times daily as needed (pain).  06/20/15   [provider]  sildenafil (REVATIO) 20 MG tablet TAKE 2 TO 5 TABLETS BY MOUTH 1 HOUR PRIOR TO INTERCOURSE 10/09/22   Stoioff, Verna Czech, MD  simvastatin (ZOCOR) 40 MG tablet Take 1 tablet (40 mg total) by mouth daily. 11/29/22   Marrion Coy, MD  Testosterone 20.25 MG/ACT (1.62%) GEL APPLY 4 APPLICATIONS TOPICALLY DAILY. 05/12/22   Stoioff, Verna Czech, MD  TOUJEO MAX SOLOSTAR 300 UNIT/ML Solostar Pen Inject 80-120 Units into the skin in the morning and at bedtime. Inject 80 Units into the skin daily. 80 units in the am and 120 units at bedtime    [provider]  TRULICITY 1.5 MG/0.5ML SOPN Inject 1.5 mg into the skin once a week. 01/16/21   [provider]  vitamin C (ASCORBIC ACID) 250 MG tablet Take 250 mg by mouth daily.    [provider]   Physical Exam: Vitals:   12/08/22 1846 12/08/22 1847 12/08/22 2206 12/08/22 2235  BP: (!) 146/92 129/81 (!) 162/94   Pulse: 93 94 98   Resp: 20 20 17    Temp: 98.7 F (37.1 C) 98.7 F (37.1 C) 97.8 F (36.6 C)   TempSrc: Oral  Oral   SpO2: 98% 99% 100%   Height:    5\' 11"  (1.803 m)   Constitutional: appears age-appropriate, NAD, calm Eyes: PERRL, lids and conjunctivae normal ENMT: Mucous membranes are  moist. Posterior pharynx clear of any exudate or lesions. Age-appropriate dentition. Hearing appropriate Neck: normal, supple, no masses, no thyromegaly Respiratory: clear to auscultation bilaterally, no wheezing, no crackles. Normal respiratory effort. No accessory muscle use.  Cardiovascular: Regular rate and rhythm, no murmurs / rubs / gallops. No extremity edema. 2+ pedal pulses. No carotid bruits.  Abdomen: no tenderness, no masses palpated, no hepatosplenomegaly. Bowel sounds positive.  Musculoskeletal: no clubbing / cyanosis. No joint deformity upper and lower extremities. Good ROM, no contractures, no atrophy. Normal muscle tone.  Skin: Left foot with dressing in place that appears clean.  Unable to visualize amputation. Neurologic: Sensation intact. Strength 5/5 in all 4.  Psychiatric: Normal judgment and insight. Alert and oriented x 3. Normal mood.  EKG: Not indicated at this time  X-ray on Admission: I personally reviewed and I agree with radiologist reading as below.  DG Foot Complete Left  Result Date: 12/08/2022 CLINICAL DATA:  Recent great toe amputation infection EXAM: LEFT FOOT - COMPLETE 3+ VIEW COMPARISON:  04/27/2022 FINDINGS: Interval amputation of the first digit at the level of the MTP joint. Vascular calcifications. No osseous erosive or destructive change. No soft tissue emphysema. IMPRESSION: Interval amputation of the first digit at the level of the MTP joint. No definite acute osseous abnormality Electronically Signed   By: Jasmine Pang M.D.   On: 12/08/2022 18:52    Labs on Admission: I have personally reviewed following labs  CBC: Recent Labs  Lab 12/08/22 1601  WBC 7.4  NEUTROABS 5.3  HGB 16.0  HCT 47.7  MCV 95.4  PLT 193   Basic Metabolic Panel: Recent Labs  Lab 12/08/22 1601  NA 137  K 4.3  CL 104  CO2 23  GLUCOSE 175*  BUN 15  CREATININE 1.15  CALCIUM 8.7*   GFR: Estimated Creatinine Clearance: 79.6 mL/min (by C-G formula based on SCr  of 1.15 mg/dL).  Liver Function Tests: Recent Labs  Lab 12/08/22 1601  AST 15  ALT 17  ALKPHOS 68  BILITOT 0.3  PROT 7.3  ALBUMIN 3.2*   Urine analysis:    Component Value Date/Time   COLORURINE YELLOW 02/07/2021 0908   APPEARANCEUR Clear 08/19/2021 1501   LABSPEC 1.015 02/07/2021 0908   PHURINE 5.5 02/07/2021 0908   GLUCOSEU 2+ (A) 08/19/2021 1501   HGBUR NEGATIVE 02/07/2021 0908   BILIRUBINUR Negative 08/19/2021 1501   KETONESUR 15 (A) 02/07/2021 0908   PROTEINUR Negative 08/19/2021 1501   PROTEINUR NEGATIVE 02/07/2021 0908   NITRITE Negative 08/19/2021 1501   NITRITE NEGATIVE 02/07/2021 0908   LEUKOCYTESUR Negative 08/19/2021 1501   LEUKOCYTESUR NEGATIVE 02/07/2021 0908   This document was prepared using Dragon Voice Recognition software and may include unintentional dictation errors.  Dr. Sedalia Muta [facility]  If 7PM-7AM, please contact overnight-coverage provider If 7AM-7PM, please contact day attending provider [email or web address]  12/08/2022, 10:57 PM

## 2022-12-08 NOTE — Assessment & Plan Note (Signed)
Home long-acting insulin dosing resumed on admission Insulin SSI with at bedtime coverage ordered Heart healthy/carb modified diet ordered Goal inpatient blood glucose levels 140-180

## 2022-12-08 NOTE — Progress Notes (Signed)
Pharmacy Antibiotic Note  Corey Foster is a 67 y.o. male admitted on 12/08/2022 with  wound infection .  Pharmacy has been consulted for vancomycin dosing.  Plan: Day 1 of antibiotics Give vancomycin 1000 mg IV Q12H. Goal AUC 400-550. Expected AUC: 413.5 Expected Css min: 12.5 SCr used: 1.15  Weight used: IBW, Vd used: 0.72  Patient is also on ceftriaxone 2 g IV Q24H Continue to monitor renal function and follow culture results      Temp (24hrs), Avg:98.6 F (37 C), Min:98.3 F (36.8 C), Max:98.7 F (37.1 C)  Recent Labs  Lab 12/08/22 1601  WBC 7.4  CREATININE 1.15  LATICACIDVEN 1.2    Estimated Creatinine Clearance: 79.6 mL/min (by C-G formula based on SCr of 1.15 mg/dL).    Allergies  Allergen Reactions   Nsaids Other (See Comments)    AKI AKI     Antimicrobials this admission: 10/14 ceftriaxone >>  10/14 Vanc >>   Dose adjustments this admission: None  Microbiology results: 10/14 BCx: IP   Thank you for allowing pharmacy to be a part of this patient's care.  Merryl Hacker, PharmD Clinical Pharmacist 12/08/2022 9:27 PM

## 2022-12-08 NOTE — ED Triage Notes (Signed)
Pt is here for evaluation of infection in left foot.  Pt had recent left great toe amputation and he had no post op follow up due to lack of transportation so [facility] and they felt that it might be infected due to purulent discharge.  Pt denies any issues, no fever or chills.  Pt has dressing in place on left foot.

## 2022-12-08 NOTE — Assessment & Plan Note (Signed)
Blood pressure is 129/81, currently controlled Hydralazine 10 mg p.o. every 6 hours as needed for SBP greater 170, 5 days ordered

## 2022-12-08 NOTE — ED Provider Notes (Signed)
[facility] Provider Note    Event Date/Time   First MD Initiated Contact with Patient 12/08/22 1949     (approximate)   History   Foot Problem (infection)   HPI  Corey Foster is a 67 y.o. male  here with foot wound. Pt is s/p recent amputation of L great toe, with Dr. Excell Seltzer, on 10/5. Reports that he was unable to get transportation to f/u. He went to UC to have his dressing changed and was sent here for evaluation. Reports he has not had any significant increase in pain, though he doesn't feel the area anyways. Denies fevers, chills. He has not changed the dressing at all since d/c. No other complaints. Reports he still feels like he has a toe.      Physical Exam   Triage Vital Signs: ED Triage Vitals  Encounter Vitals Group     BP 12/08/22 1553 94/78     Systolic BP Percentile --      Diastolic BP Percentile --      Pulse Rate 12/08/22 1553 95     Resp 12/08/22 1553 16     Temp 12/08/22 1553 98.3 F (36.8 C)     Temp Source 12/08/22 1553 Oral     SpO2 12/08/22 1553 99 %     Weight --      Height --      Head Circumference --      Peak Flow --      Pain Score 12/08/22 1554 0     Pain Loc --      Pain Education --      Exclude from Growth Chart --     Most recent vital signs: Vitals:   12/08/22 1847 12/08/22 2206  BP: 129/81 (!) 162/94  Pulse: 94 98  Resp: 20 17  Temp: 98.7 F (37.1 C) 97.8 F (36.6 C)  SpO2: 99% 100%     General: Awake, no distress.  CV:  Good peripheral perfusion.  Resp:  Normal work of breathing.  Abd:  No distention.  Other:  R great toe amputation site as below, with swelling, redness along plantar aspect. Small amount of drainage noted.         ED Results / Procedures / Treatments   Labs (all labs ordered are listed, but only abnormal results are displayed) Labs Reviewed  COMPREHENSIVE METABOLIC PANEL - Abnormal; Notable for the following components:      Result Value   Glucose, Bld 175 (*)     Calcium 8.7 (*)    Albumin 3.2 (*)    All other components within normal limits  GLUCOSE, CAPILLARY - Abnormal; Notable for the following components:   Glucose-Capillary 68 (*)    All other components within normal limits  GLUCOSE, CAPILLARY - Abnormal; Notable for the following components:   Glucose-Capillary 63 (*)    All other components within normal limits  GLUCOSE, CAPILLARY - Abnormal; Notable for the following components:   Glucose-Capillary 129 (*)    All other components within normal limits  CULTURE, BLOOD (ROUTINE X 2)  CULTURE, BLOOD (ROUTINE X 2)  LACTIC ACID, PLASMA  CBC WITH DIFFERENTIAL/PLATELET  SEDIMENTATION RATE  HIV ANTIBODY (ROUTINE TESTING W REFLEX)  BASIC METABOLIC PANEL  CBC     EKG    RADIOLOGY DG Foot Left: interval amputation of first digit at MTP joint   I also independently reviewed and agree with radiologist interpretations.   PROCEDURES:  Critical Care performed: No  MEDICATIONS ORDERED IN ED: Medications  simvastatin (ZOCOR) tablet 40 mg (has no administration in time range)  ascorbic acid (VITAMIN C) tablet 250 mg (has no administration in time range)  albuterol (PROVENTIL) (2.5 MG/3ML) 0.083% nebulizer solution 2.5 mg (has no administration in time range)  acetaminophen (TYLENOL) tablet 650 mg (has no administration in time range)    Or  acetaminophen (TYLENOL) suppository 650 mg (has no administration in time range)  ondansetron (ZOFRAN) tablet 4 mg (has no administration in time range)    Or  ondansetron (ZOFRAN) injection 4 mg (has no administration in time range)  morphine (PF) 4 MG/ML injection 4 mg (has no administration in time range)  morphine (PF) 2 MG/ML injection 2 mg (has no administration in time range)  HYDROmorphone (DILAUDID) injection 1 mg (has no administration in time range)  hydrALAZINE (APRESOLINE) tablet 10 mg (has no administration in time range)  cefTRIAXone (ROCEPHIN) 2 g in sodium chloride 0.9 % 100  mL IVPB (has no administration in time range)  insulin aspart (novoLOG) injection 0-15 Units (has no administration in time range)  insulin aspart (novoLOG) injection 0-5 Units ( Subcutaneous Not Given 12/08/22 2214)  vancomycin (VANCOCIN) IVPB 1000 mg/200 mL premix (has no administration in time range)  dextrose 50 % solution 50 mL (has no administration in time range)  cefTRIAXone (ROCEPHIN) 2 g in sodium chloride 0.9 % 100 mL IVPB (0 g Intravenous Stopped 12/08/22 2215)  vancomycin (VANCOREADY) IVPB 1750 mg/350 mL (1,750 mg Intravenous New Bag/Given 12/08/22 2131)  heparin injection 5,000 Units (5,000 Units Subcutaneous Given 12/08/22 2300)     IMPRESSION / MDM / ASSESSMENT AND PLAN / ED COURSE  I reviewed the triage vital signs and the nursing notes.                              Differential diagnosis includes, but is not limited to, wound site breakdown from lack of dressing changes, mild cellulitis, unlikely deep abscess/subcutaneous infection, osteo  Patient's presentation is most consistent with acute presentation with potential threat to life or bodily function.  The patient is on the cardiac monitor to evaluate for evidence of arrhythmia and/or significant heart rate changes  67 yo M here with R toe wound, redness after recent amputation. Pt had not been able to f/u as outpatient due to transportation issues. Plain films show no acute osseous abnormality.CBC reassuring. ESR pending. CMP at baseline. Wound as above, with moderate edema, slight wound breakdown, no overt purulence. I discussed the case with Dr. Alberteen Spindle of Podiatry. Given his difficulty with following up, redness around wound, will place on empiric ABX and admit for evaluation, observation.    FINAL CLINICAL IMPRESSION(S) / ED DIAGNOSES   Final diagnoses:  Right foot pain  Surgical wound present     Rx / DC Orders   ED Discharge Orders     None        Note:  This document was prepared using Dragon voice  recognition software and may include unintentional dictation errors.   [name], MD 12/08/22 [phone]

## 2022-12-08 NOTE — Assessment & Plan Note (Signed)
Patient had 2 readings of hypoglycemia of 68 and 63 followed by 129 Continue insulin SSI with at bedtime coverage ordered Home long-acting insulin not resumed on admission, a.m. team to resume when the benefits outweigh the risk

## 2022-12-08 NOTE — Assessment & Plan Note (Signed)
Home simvastatin 40 mg nightly resumed

## 2022-12-08 NOTE — Progress Notes (Signed)
PHARMACY -  BRIEF ANTIBIOTIC NOTE   Pharmacy has received consult(s) for vancomycin from an ED provider.  The patient's profile has been reviewed for ht/wt/allergies/indication/available labs.    One time order(s) placed for vancomycin 1.75 g IV x 1  Further antibiotics/pharmacy consults should be ordered by admitting physician if indicated.                       Thank you, Merryl Hacker, PharmD Clinical Pharmacist 12/08/2022  8:15 PM

## 2022-12-08 NOTE — Assessment & Plan Note (Signed)
Secondary to diabetes, treat per above

## 2022-12-08 NOTE — Hospital Course (Signed)
Corey Foster is a 68 year old male with history of insulin-dependent diabetes mellitus 2, hyperlipidemia, hypertension, who presents emergency department for chief concerns of difficulty walking, left foot pain.  Vitals in the ED showed temperature of 98.7, respiration rate of 20, heart rate of 92 4, blood pressure 129/81, SpO2 99% on room air.  Serum sodium is 137, potassium 4.3, chloride 104, bicarb 23, BUN of 15, serum creatinine 1.15, EGFR greater than 60, nonfasting blood glucose 175, WBC 7.4, hemoglobin 16, platelets of 193.  Lactic acid is 1.2.  Left foot x-ray: Was read as interval amputation of the first digit at the level of the MTP joint.  No definite acute osseous abnormality.  ED treatment: Ceftriaxone 2 g IV one-time dose, vancomycin per pharmacy.

## 2022-12-09 ENCOUNTER — Other Ambulatory Visit: Payer: Self-pay

## 2022-12-09 ENCOUNTER — Encounter: Payer: Self-pay | Admitting: Internal Medicine

## 2022-12-09 DIAGNOSIS — M79672 Pain in left foot: Secondary | ICD-10-CM | POA: Diagnosis not present

## 2022-12-09 DIAGNOSIS — T8149XA Infection following a procedure, other surgical site, initial encounter: Secondary | ICD-10-CM | POA: Diagnosis present

## 2022-12-09 LAB — BASIC METABOLIC PANEL
Anion gap: 7 (ref 5–15)
BUN: 13 mg/dL (ref 8–23)
CO2: 24 mmol/L (ref 22–32)
Calcium: 8 mg/dL — ABNORMAL LOW (ref 8.9–10.3)
Chloride: 105 mmol/L (ref 98–111)
Creatinine, Ser: 1.07 mg/dL (ref 0.61–1.24)
GFR, Estimated: >60 mL/min (ref >60)
Glucose, Bld: 139 mg/dL — ABNORMAL HIGH (ref 70–99)
Potassium: 3.8 mmol/L (ref 3.5–5.1)
Sodium: 136 mmol/L (ref 135–145)

## 2022-12-09 LAB — CBC
HCT: 41.6 % (ref 39.0–52.0)
Hemoglobin: 14.1 g/dL (ref 13.0–17.0)
MCH: 32.1 pg (ref 26.0–34.0)
MCHC: 33.9 g/dL (ref 30.0–36.0)
MCV: 94.8 fL (ref 80.0–100.0)
Platelets: 220 10*3/uL (ref 150–400)
RBC: 4.39 MIL/uL (ref 4.22–5.81)
RDW: 13.5 % (ref 11.5–15.5)
WBC: 7.3 10*3/uL (ref 4.0–10.5)
nRBC: 0 % (ref 0.0–0.2)

## 2022-12-09 LAB — HIV ANTIBODY (ROUTINE TESTING W REFLEX): HIV Screen 4th Generation wRfx: NONREACTIVE

## 2022-12-09 LAB — GLUCOSE, CAPILLARY
Glucose-Capillary: 191 mg/dL — ABNORMAL HIGH (ref 70–99)
Glucose-Capillary: 122 mg/dL — ABNORMAL HIGH (ref 70–99)

## 2022-12-09 MED ORDER — BACLOFEN 10 MG PO TABS
10.0000 mg | ORAL_TABLET | Freq: Two times a day (BID) | ORAL | Status: DC
  Administered 2022-12-09: 10 mg via ORAL
  Filled 2022-12-09: qty 1

## 2022-12-09 MED ORDER — METRONIDAZOLE 500 MG/100ML IV SOLN
500.0000 mg | Freq: Two times a day (BID) | INTRAVENOUS | Status: DC
  Administered 2022-12-09: 500 mg via INTRAVENOUS
  Filled 2022-12-09: qty 100

## 2022-12-09 MED ORDER — OXYCODONE HCL 5 MG PO TABS: 5.0000 mg | ORAL_TABLET | ORAL | Status: DC | PRN

## 2022-12-09 MED ORDER — ASPIRIN 325 MG PO TBEC
325.0000 mg | DELAYED_RELEASE_TABLET | Freq: Every day | ORAL | Status: DC
  Administered 2022-12-09: 325 mg via ORAL
  Filled 2022-12-09: qty 1

## 2022-12-09 MED ORDER — LOSARTAN POTASSIUM 50 MG PO TABS
100.0000 mg | ORAL_TABLET | Freq: Every day | ORAL | Status: DC
  Administered 2022-12-09: 100 mg via ORAL
  Filled 2022-12-09: qty 2

## 2022-12-09 MED ORDER — ALPRAZOLAM 0.5 MG PO TABS: 1.0000 mg | ORAL_TABLET | Freq: Four times a day (QID) | ORAL | Status: DC | PRN

## 2022-12-09 MED ORDER — AMOXICILLIN-POT CLAVULANATE 875-125 MG PO TABS
1.0000 | ORAL_TABLET | Freq: Two times a day (BID) | ORAL | 0 refills | Status: AC
  Filled 2022-12-09: qty 10, 5d supply, fill #0

## 2022-12-09 NOTE — Inpatient Diabetes Management (Signed)
Inpatient Diabetes Program Recommendations  AACE/ADA: New Consensus Statement on Inpatient Glycemic Control   Target Ranges:  Prepandial:   less than 140 mg/dL      Peak postprandial:   less than 180 mg/dL (1-2 hours)      Critically ill patients:  140 - 180 mg/dL    Latest Reference Range & Units 12/08/22 22:07 12/08/22 22:22 12/08/22 22:47 12/09/22 02:50  Glucose-Capillary 70 - 99 mg/dL 68 (L) 63 (L) 161 (H) 096 (H)    Latest Reference Range & Units 11/27/22 13:49  Hemoglobin A1C 4.8 - 5.6 % >15.5 (H)   Review of Glycemic Control  Diabetes history: DM2 Outpatient Diabetes medications: Jardiance 25 mg daily, Metformin 1000 mg BID (not taking), Novolog 40 units TID with meals, Toujeo 80 units QAM, 120 units at bedtime, Trulicity 1.5 mg Qweek Current orders for Inpatient glycemic control: Novolog 0-15 units TID with meals, Novolog 0-5 units QHS  Inpatient Diabetes Program Recommendations:    HbgA1C: A1C >15.5% on 11/27/22 indicating an average glucose over 398 mg/dl.   NOTE: Patient admitted with left foot pain following recent toe amputation on 11/29/22 and noted hypoglycemia on 12/08/22.   Addendum 12/09/22@10 :15-Spoke with patient at bedside about diabetes and home regimen for diabetes control. Patient reports being followed by PCP for diabetes management and currently taking Jardiance 25 mg daily, Novolog 40 units usually once a day with lunch if CBG is over 150 mg/dl, Toujeo 80 units QAM, 045 units at bedtime, and Trulicity 1.5 mg Qweek as an outpatient for diabetes control. Patient reports he is not taking Metformin. Patient reports taking DM medications as prescribed and reports that his glucose has been better since hospital discharge following toe amputation surgery. Patient states that his glucose has ranged from 65-upper 100's mg/dl since over the past week.  Reviewed A1C results (>15.5% on 11/27/22) and explained that current A1C indicates an average glucose over 398 mg/dl over the  past 2-3 months. Discussed glucose and A1C goals. Discussed importance of checking CBGs and maintaining good CBG control to prevent long-term and short-term complications. Explained how hyperglycemia leads to damage within blood vessels which lead to the common complications seen with uncontrolled diabetes. Stressed to the patient the importance of improving glycemic control to prevent further complications from uncontrolled diabetes. Patient stated, he has had DM for 13 years and him and his PCP are working on his DM so he does not see the point of me talking with him.  Discussed concern regarding A1C over 15.5% with recent surgery and patient again states he knows what to do and he is working with his doctor on it.  Patient kindly states that he is tired and he doesn't want to talk about his DM any further.  Thanks, Orlando Penner, RN, MSN, CDCES Diabetes Coordinator Inpatient Diabetes Program [phone] (Team Pager from 8am to 5pm)

## 2022-12-09 NOTE — Discharge Summary (Signed)
Physician Discharge Summary   Patient: Corey Foster MRN: [id number] DOB: Jul 16, 1955  Admit date:     12/08/2022  Discharge date: 12/09/22  Discharge Physician: Jonah Blue   PCP: Emogene Morgan, MD   Recommendations at discharge:   Continue Augmentin x 5 days Follow up with podiatry next week as scheduled Leave dressing in place until follow up appointment Decrease Toujeo to 60 units daily and continue all other diabetes medications as ordered Follow up with Dr. Letta Pate in 1-2 weeks You are also recommended to follow up with endocrinology  Discharge Diagnoses: Principal Problem:   Left foot pain Active Problems:   Type II diabetes mellitus with renal manifestations (HCC)   DDD (degenerative disc disease), lumbosacral   Essential hypertension   Decreased libido   HLD (hyperlipidemia)   Hyperglycemia   Hypoglycemia   Wound infection after surgery    Hospital Course: 66yo with history of DM, hyperlipidemia, and hypertension, who presented on 10/14 with difficulty walking, left foot pain with purulence.  He underwent partial 1st ray amputation on 10/15.  Podiatry consulted.  Started on Ceftriaxone, Vanc.  Assessment and Plan:  Left foot pain Patient with recent (10/5) L hallux amputation He missed f/u appointment due to transportation issues and went to [facility] yesterday They were concerned about purulent discharge and sent him to the ER Observed overnight Lactic acid is within normal limits and WBC is not elevated Given Ceftriaxone, Vanc, Flagyl Podiatry has been consulted by EDP, Dr. Alberteen Spindle Patient was seen this AM by podiatry, no indication for revision Has f/u next week with podiatry Will leave bandage in place until f/u appointment Discharge to home with Augmentin x 5 days Continue ASA 325 mg daily for associated PAD   Type II diabetes mellitus with renal manifestations (HCC) 2 episodes of hypoglycemia last evening, likely related to not eating during  doctor/ER visits Decrease dose of long-acting insulin dosing  to 60 units daily, continue hefty dose (40 units) of mealtime insulin Continue Jardiance, Trulicity A1c >15.5 Recommend outpatient endocrinology f/u   HLD (hyperlipidemia) Home simvastatin 40 mg nightly resumed Also on fish oil   Essential hypertension Continue losartan  Chronic pain Continue home opiates, no new medications prescribed I have reviewed this patient in the [place] Controlled Substances Reporting System.  He is receiving medications two providers but appears to be taking them as prescribed. He IS at particularly high risk of opioid misuse, diversion, or overdose Continue balcofen  Anxiety d/o Continue home alprazolam   Obesity Body mass index is 34.73 kg/m.[name]  Weight loss should be encouraged Outpatient PCP/bariatric medicine/bariatric surgery f/u encouraged            Consultants: Podiatry   Procedures: None   Antibiotics: Ceftriaxone 10/14-15 Vancomycin 10/14-15 Metronidazole x 1 Augmentin 10/15-20    30 Day Unplanned Readmission Risk Score    Flowsheet Row ED to Hosp-Admission (Current) from 12/08/2022 in [facility]  30 Day Unplanned Readmission Risk Score (%) 11.57 Filed at 12/09/2022 0801       This score is the patient's risk of an unplanned readmission within 30 days of being discharged (0 -100%). The score is based on dignosis, age, lab data, medications, orders, and past utilization.   Low:  0-14.9   Medium: 15-21.9   High: 22-29.9   Extreme: 30 and above          Pain control - [place] Controlled Substance Reporting System database was reviewed. and patient was instructed,  not to drive, operate heavy machinery, perform activities at heights, swimming or participation in water activities or provide baby-sitting services while on Pain, Sleep and Anxiety Medications; until their outpatient Physician has advised to do so again. Also  recommended to not to take more than prescribed Pain, Sleep and Anxiety Medications.   Disposition: Home Diet recommendation:  Carb modified diet DISCHARGE MEDICATION: Allergies as of 12/09/2022       Reactions   Nsaids Other (See Comments)   AKI AKI        Medication List     STOP taking these medications    metFORMIN 1000 MG tablet Commonly known as: GLUCOPHAGE       TAKE these medications    albuterol 108 (90 Base) MCG/ACT inhaler Commonly known as: VENTOLIN HFA Inhale 1 puff into the lungs every 6 (six) hours as needed for wheezing or shortness of breath.   ALPRAZolam 1 MG tablet Commonly known as: XANAX Take 1 mg by mouth 4 (four) times daily as needed.   AMBULATORY NON FORMULARY MEDICATION Trimix (30/1/10)-(Pap/Phent/PGE)  Dosage: Inject 0.3 cc and may increase 0.1cc to achieve and erection lasting no longer than 1 hour per injection   Vial 1ml  Qty #5 refills 2  [facility] [phone] Fax [phone]   amoxicillin-clavulanate 875-125 MG tablet Commonly known as: AUGMENTIN Take 1 tablet by mouth 2 (two) times daily for 5 days.   aspirin EC 325 MG tablet Take 325 mg by mouth daily.   baclofen 10 MG tablet Commonly known as: LIORESAL Take 10 mg by mouth 2 (two) times daily.   cholecalciferol 10 MCG (400 UNIT) Tabs tablet Commonly known as: VITAMIN D3 Take 10,000 Units by mouth daily.   Fish Oil 1000 MG Caps Take 1 capsule by mouth daily.   furosemide 20 MG tablet Commonly known as: LASIX Take 20 mg by mouth daily.   Jardiance 25 MG Tabs tablet Generic drug: empagliflozin Take 25 mg by mouth daily.   KETO PO Take 1 capsule by mouth.   losartan 100 MG tablet Commonly known as: COZAAR Take 100 mg by mouth daily.   naloxone 0.4 MG/ML injection Commonly known as: NARCAN Inject as directed as directed   NovoLOG FlexPen 100 UNIT/ML FlexPen Generic drug: insulin aspart Inject 40 Units into the skin 3 (three) times daily  with meals.   Oxycodone HCl 20 MG Tabs Take 20 mg by mouth 4 (four) times daily as needed (pain).   sildenafil 20 MG tablet Commonly known as: REVATIO TAKE 2 TO 5 TABLETS BY MOUTH 1 HOUR PRIOR TO INTERCOURSE   simvastatin 40 MG tablet Commonly known as: ZOCOR Take 1 tablet (40 mg total) by mouth daily.   Testosterone 20.25 MG/ACT (1.62%) Gel APPLY 4 APPLICATIONS TOPICALLY DAILY.   Toujeo Max SoloStar 300 UNIT/ML Solostar Pen Generic drug: insulin glargine (2 Unit Dial) Inject 80-120 Units into the skin in the morning and at bedtime. Inject 80 Units into the skin daily. 80 units in the am and 120 units at bedtime   Trulicity 1.5 MG/0.5ML Sopn Generic drug: Dulaglutide Inject 1.5 mg into the skin once a week.   vitamin C 250 MG tablet Commonly known as: ASCORBIC ACID Take 250 mg by mouth daily.        Discharge Exam:   Subjective: Feeling ok, happy that podiatry is not concerned about infection, wants to go home.   Objective: Vitals:   12/09/22 0403 12/09/22 0736  BP: 104/66 109/68  Pulse: 91 85  Resp:  18  Temp: 98.8 F (37.1 C) 97.8 F (36.6 C)  SpO2: 99% 98%    Intake/Output Summary (Last 24 hours) at 12/09/2022 1139 Last data filed at 12/09/2022 1047 Gross per 24 hour  Intake 240 ml  Output --  Net 240 ml   There were no vitals filed for this visit.  Exam:  General:  Appears calm and comfortable and is in NAD Eyes:  EOMI, normal lids, iris ENT:  grossly normal hearing, lips & tongue, mmm Neck:  no LAD, masses or thyromegaly Cardiovascular:  RRR, no m/r/g. No LE edema.  Respiratory:   CTA bilaterally with no wheezes/rales/rhonchi.  Normal respiratory effort. Abdomen:  soft, NT, ND Skin:  no rash or induration seen on limited exam Musculoskeletal:  grossly normal tone BUE/BLE; L foot wrapped by podiatry Psychiatric:  grossly normal mood and affect, speech fluent and appropriate, AOx3 Neurologic:  CN 2-12 grossly intact, moves all extremities in  coordinated fashion  Data Reviewed: I have reviewed the patient's lab results since admission.  Pertinent labs for today include:   Glucose 139 Normal CBC Blood cultures NTD Lactate 1.2     Condition at discharge: good  The results of significant diagnostics from this hospitalization (including imaging, microbiology, ancillary and laboratory) are listed below for reference.   Imaging Studies: DG Foot Complete Left  Result Date: 12/08/2022 CLINICAL DATA:  Recent great toe amputation infection EXAM: LEFT FOOT - COMPLETE 3+ VIEW COMPARISON:  04/27/2022 FINDINGS: Interval amputation of the first digit at the level of the MTP joint. Vascular calcifications. No osseous erosive or destructive change. No soft tissue emphysema. IMPRESSION: Interval amputation of the first digit at the level of the MTP joint. No definite acute osseous abnormality Electronically Signed   By: Jasmine Pang M.D.   On: 12/08/2022 18:52   PERIPHERAL VASCULAR CATHETERIZATION  Result Date: 11/28/2022 See surgical note for result.  DG Foot Complete Left  Result Date: 11/27/2022 CLINICAL DATA:  Left great toe gangrene. EXAM: LEFT FOOT - COMPLETE 3+ VIEW COMPARISON:  None Available. FINDINGS: Mild hallux valgus. Mildly decreased bone mineralization. Moderate great toe soft tissue swelling. No definite cortical erosion is seen. No subcutaneous air. Joint spaces are preserved. No acute fracture dislocation. High-grade atherosclerotic calcifications. IMPRESSION: Moderate great toe soft tissue swelling. No definite cortical erosion is seen. No subcutaneous air. Electronically Signed   By: Neita Garnet M.D.   On: 11/27/2022 13:31    Microbiology: Results for orders placed or performed during the hospital encounter of 12/08/22  Blood culture (routine x 2)     Status: None (Preliminary result)   Collection Time: 12/08/22  8:38 PM   Specimen: BLOOD  Result Value Ref Range Status   Specimen Description BLOOD BLOOD RIGHT ARM   Final   Special Requests   Final    BOTTLES DRAWN AEROBIC AND ANAEROBIC Blood Culture results may not be optimal due to an excessive volume of blood received in culture bottles   Culture   Final    NO GROWTH < 12 HOURS Performed at [facility], [address] [place], [place] [address]    Report Status PENDING  Incomplete  Blood culture (routine x 2)     Status: None (Preliminary result)   Collection Time: 12/08/22  8:38 PM   Specimen: BLOOD  Result Value Ref Range Status   Specimen Description BLOOD BLOOD LEFT ARM  Final   Special Requests   Final    BOTTLES DRAWN AEROBIC AND ANAEROBIC  Blood Culture adequate volume   Culture   Final    NO GROWTH < 12 HOURS Performed at [facility], [address] [place], [place] [address]    Report Status PENDING  Incomplete    Labs: CBC: Recent Labs  Lab 12/08/22 1601 12/09/22 0450  WBC 7.4 7.3  NEUTROABS 5.3  --   HGB 16.0 14.1  HCT 47.7 41.6  MCV 95.4 94.8  PLT 193 220   Basic Metabolic Panel: Recent Labs  Lab 12/08/22 1601 12/09/22 0450  NA 137 136  K 4.3 3.8  CL 104 105  CO2 23 24  GLUCOSE 175* 139*  BUN 15 13  CREATININE 1.15 1.07  CALCIUM 8.7* 8.0*   Liver Function Tests: Recent Labs  Lab 12/08/22 1601  AST 15  ALT 17  ALKPHOS 68  BILITOT 0.3  PROT 7.3  ALBUMIN 3.2*   CBG: Recent Labs  Lab 12/08/22 2207 12/08/22 2222 12/08/22 2247 12/09/22 0250 12/09/22 1107  GLUCAP 68* 63* 129* 191* 122*    Discharge time spent: greater than 30 minutes.  Signed: Jonah Blue, MD [facility] 12/09/2022

## 2022-12-09 NOTE — Progress Notes (Signed)
Subjective/Chief Complaint: Patient seen.  No specific complaints.   Objective: Vital signs in last 24 hours: Temp:  [97.8 F (36.6 C)-98.8 F (37.1 C)] 97.8 F (36.6 C) (10/15 0736) Pulse Rate:  [85-98] 85 (10/15 0736) Resp:  [16-20] 18 (10/15 0736) BP: (94-162)/(66-94) 109/68 (10/15 0736) SpO2:  [96 %-100 %] 98 % (10/15 0736) Last BM Date : 12/08/22 (per pt report)  Intake/Output from previous day: No intake/output data recorded. Intake/Output this shift: No intake/output data recorded.  No significant drainage noted on the bandaging.  Incision appears well coapted and less macerated than last evening.  Some mild incisional necrosis forming but the incision appears stable with no gapping.  There is a dorsal serous blister.  No signs of purulence.  Lab Results:  Recent Labs    12/08/22 1601 12/09/22 0450  WBC 7.4 7.3  HGB 16.0 14.1  HCT 47.7 41.6  PLT 193 220   BMET Recent Labs    12/08/22 1601 12/09/22 0450  NA 137 136  K 4.3 3.8  CL 104 105  CO2 23 24  GLUCOSE 175* 139*  BUN 15 13  CREATININE 1.15 1.07  CALCIUM 8.7* 8.0*   PT/INR No results for input(s): "LABPROT", "INR" in the last 72 hours. ABG No results for input(s): "PHART", "HCO3" in the last 72 hours.  Invalid input(s): "PCO2", "PO2"  Studies/Results: DG Foot Complete Left  Result Date: 12/08/2022 CLINICAL DATA:  Recent great toe amputation infection EXAM: LEFT FOOT - COMPLETE 3+ VIEW COMPARISON:  04/27/2022 FINDINGS: Interval amputation of the first digit at the level of the MTP joint. Vascular calcifications. No osseous erosive or destructive change. No soft tissue emphysema. IMPRESSION: Interval amputation of the first digit at the level of the MTP joint. No definite acute osseous abnormality Electronically Signed   By: Jasmine Pang M.D.   On: 12/08/2022 18:52    Anti-infectives: Anti-infectives (From admission, onward)    Start     Dose/Rate Route Frequency Ordered Stop   12/09/22  2000  cefTRIAXone (ROCEPHIN) 2 g in sodium chloride 0.9 % 100 mL IVPB        2 g 200 mL/hr over 30 Minutes Intravenous Every 24 hours 12/08/22 2123 12/13/22 1959   12/09/22 1000  metroNIDAZOLE (FLAGYL) IVPB 500 mg        500 mg 100 mL/hr over 60 Minutes Intravenous Every 12 hours 12/09/22 0749     12/09/22 0900  vancomycin (VANCOCIN) IVPB 1000 mg/200 mL premix        1,000 mg 200 mL/hr over 60 Minutes Intravenous Every 12 hours 12/08/22 2138     12/08/22 2100  vancomycin (VANCOREADY) IVPB 1750 mg/350 mL        1,750 mg 175 mL/hr over 120 Minutes Intravenous  Once 12/08/22 2016 12/09/22 0033   12/08/22 2015  cefTRIAXone (ROCEPHIN) 2 g in sodium chloride 0.9 % 100 mL IVPB        2 g 200 mL/hr over 30 Minutes Intravenous  Once 12/08/22 2007 12/08/22 2215       Assessment/Plan: s/p * No surgery found * Assessment: Status post amputation left hallux, stable.  Plan: Betadine gauze and sterile dressing reapplied to the left foot.  At this point I do not think he needs any urgent revision of his surgical site.  Discussed that at some point depending on his healing progression may need to consider revising the incision with removal of some of the metatarsal head.  Discussed with the patient we need to  make sure that he has a follow-up with Dr. Excell Seltzer next week.  Patient should be stable for discharge today or tomorrow.  Podiatry to sign off for now.  LOS: 1 day    Ricci Barker 12/09/2022

## 2022-12-09 NOTE — Plan of Care (Signed)
Problem: Fluid Volume: Goal: Hemodynamic stability will improve Outcome: Progressing   Problem: Clinical Measurements: Goal: Diagnostic test results will improve Outcome: Progressing Goal: Signs and symptoms of infection will decrease Outcome: Progressing

## 2022-12-09 NOTE — TOC CM/SW Note (Signed)
Transition of Care [facility]) - Inpatient Brief Assessment   Patient Details  Name: Corey Foster MRN: [id number] Date of Birth: 07/08/1955  Transition of Care [facility] - Inpatient) CM/SW Contact:    Chapman Fitch, RN Phone Number: 12/09/2022, 11:57 AM   Clinical Narrative:   Per MD home health not indicated for wound care.  Patient to leave dressing in place until follow up next week  Patient confirms he will have transportation to his follow up appointment.   Transportation resources added to AVS Transition of Care Asessment: Insurance and Status: Insurance coverage has been reviewed Patient has primary care physician: Yes     Prior/Current Home Services: No current home services Social Determinants of Health Reivew: SDOH reviewed no interventions necessary Readmission risk has been reviewed: Yes Transition of care needs: no transition of care needs at this time

## 2022-12-09 NOTE — Plan of Care (Signed)
IV's removed, discharge instructions reviewed, and patient discharged to home

## 2022-12-09 NOTE — Discharge Instructions (Signed)
Transportation Resources  Agency Name: [facility] Agency Address: [address] [place], [place] [address] Phone: [phone] Email: [email or web address] Website: [email or web address] Service(s) Offered: Housing services, self-sufficiency, congregate meal program, weatherization program, Field seismologist program, emergency food assistance,  housing counseling, home ownership program, wheels-towork program.  Agency Name: [facility] [employer] [phone]) Address: [address], [place], [place] [address] Phone: [phone] Website: [email or web address] Service(s) Offered: Transportation for [employer], subscription and demand response; Dial-a-Ride for citizens 8 years of age or older.  Agency Name: Department of Social Services Address: [address] [place], [place] [address] Phone: [phone] Service(s) Offered: Child support services; child welfare services; food stamps; Medicaid; work first family assistance; and aid with fuel,  rent, food and medicine, transportation assistance.  Agency Name: Disabled [employer] (DAV) Transportation  Network Phone: [phone] Service(s) Offered: Transports veterans to the [facility] medical center. Call  forty-eight hours in advance and leave the name, telephone  number, date, and time of appointment. Veteran will be  contacted by the driver the day before the appointment to  arrange a pick up point   Transportation Resources  Agency Name: [facility] Agency Address: [address] [place], [place] [address] Phone: [phone] Email: [email or web address] Website: [email or web address] Service(s) Offered: Housing services, self-sufficiency, congregate meal program, weatherization program, Field seismologist program, emergency food assistance,  housing counseling, home ownership program, wheels-towork  program.  Agency Name: [facility] [employer] [phone]) Address: [address] [place], [place] [address] Phone: [phone] Website: [email or web address] Service(s) Offered: Transportation for [employer], subscription and demand response; Dial-a-Ride for citizens 35 years of age or older.  Agency Name: Department of Social Services Address: [address] [place], [place] [address] Phone: [phone] Service(s) Offered: Child support services; child welfare services; food stamps; Medicaid; work first family assistance; and aid with fuel,  rent, food and medicine, transportation assistance.  Agency Name: Disabled [employer] (DAV) Transportation  Network Phone: [phone] Service(s) Offered: Transports veterans to the [facility] medical center. Call  forty-eight hours in advance and leave the name, telephone  number, date, and time of appointment. Veteran will be  contacted by the driver the day before the appointment to  arrange a pick up point    [employer]A currently provides door to door services. ACTA connects with PART daily for services to [facility] ACTA also performs contract services to [employer] operates 27 vehicles, all but 3 mini-vans are equipped with lifts for special needs as well as the general public. ACTA drivers are each CDL certified and trained in First Aid and CPR. ACT[employer] was established in 2002 by [employer]. An independent Industrial/product designer. ACTA operates via Cytogeneticist with required Research scientist (physical sciences) from [place]. ACT[employer] provides over 80,000 passenger trips each year, including [facility] and [employer] sites.  Call at least by 11 AM one business day prior to needing transportation  [employer].                      [place], [place] [address]     Office  Hours: Monday-Friday  8 AM - 5 PM

## 2022-12-09 NOTE — Care Management Obs Status (Signed)
MEDICARE OBSERVATION STATUS NOTIFICATION   Patient Details  Name: Corey Foster MRN: [id number] Date of Birth: 1955/04/05   Medicare Observation Status Notification Given:  Yes    Chapman Fitch, RN 12/09/2022, 11:50 AM

## 2022-12-09 NOTE — Care Management CC44 (Signed)
Condition Code 44 Documentation Completed  Patient Details  Name: Corey Foster MRN: [id number] Date of Birth: Jul 13, 1955   Condition Code 44 given:  Yes Patient signature on Condition Code 44 notice:  Yes Documentation of 2 MD's agreement:  Yes Code 44 added to claim:  Yes    Chapman Fitch, RN 12/09/2022, 11:50 AM

## 2022-12-10 ENCOUNTER — Other Ambulatory Visit: Payer: Self-pay

## 2022-12-11 ENCOUNTER — Other Ambulatory Visit: Payer: Self-pay

## 2022-12-13 LAB — CULTURE, BLOOD (ROUTINE X 2)
Culture: NO GROWTH
Culture: NO GROWTH
Special Requests: ADEQUATE

## 2022-12-17 ENCOUNTER — Other Ambulatory Visit: Payer: Self-pay

## 2022-12-17 ENCOUNTER — Emergency Department: Payer: Medicare HMO

## 2022-12-17 ENCOUNTER — Inpatient Hospital Stay
Admission: EM | Admit: 2022-12-17 | Discharge: 2022-12-20 | DRG: 240 | Disposition: A | Discharge status: Home / Self Care | Payer: Medicare HMO | Attending: Internal Medicine | Admitting: Internal Medicine

## 2022-12-17 DIAGNOSIS — Z79899 Other long term (current) drug therapy: Secondary | ICD-10-CM | POA: Diagnosis not present

## 2022-12-17 DIAGNOSIS — L089 Local infection of the skin and subcutaneous tissue, unspecified: Secondary | ICD-10-CM | POA: Diagnosis present

## 2022-12-17 DIAGNOSIS — E1169 Type 2 diabetes mellitus with other specified complication: Secondary | ICD-10-CM | POA: Diagnosis present

## 2022-12-17 DIAGNOSIS — I70222 Atherosclerosis of native arteries of extremities with rest pain, left leg: Secondary | ICD-10-CM | POA: Diagnosis not present

## 2022-12-17 DIAGNOSIS — E1152 Type 2 diabetes mellitus with diabetic peripheral angiopathy with gangrene: Secondary | ICD-10-CM | POA: Diagnosis not present

## 2022-12-17 DIAGNOSIS — Z7982 Long term (current) use of aspirin: Secondary | ICD-10-CM

## 2022-12-17 DIAGNOSIS — E11628 Type 2 diabetes mellitus with other skin complications: Secondary | ICD-10-CM | POA: Diagnosis present

## 2022-12-17 DIAGNOSIS — I70262 Atherosclerosis of native arteries of extremities with gangrene, left leg: Secondary | ICD-10-CM | POA: Diagnosis present

## 2022-12-17 DIAGNOSIS — E1165 Type 2 diabetes mellitus with hyperglycemia: Secondary | ICD-10-CM | POA: Diagnosis present

## 2022-12-17 DIAGNOSIS — L97509 Non-pressure chronic ulcer of other part of unspecified foot with unspecified severity: Secondary | ICD-10-CM | POA: Diagnosis present

## 2022-12-17 DIAGNOSIS — L97529 Non-pressure chronic ulcer of other part of left foot with unspecified severity: Secondary | ICD-10-CM | POA: Diagnosis not present

## 2022-12-17 DIAGNOSIS — I10 Essential (primary) hypertension: Secondary | ICD-10-CM | POA: Diagnosis present

## 2022-12-17 DIAGNOSIS — M869 Osteomyelitis, unspecified: Secondary | ICD-10-CM | POA: Diagnosis present

## 2022-12-17 DIAGNOSIS — Z886 Allergy status to analgesic agent status: Secondary | ICD-10-CM

## 2022-12-17 DIAGNOSIS — E785 Hyperlipidemia, unspecified: Secondary | ICD-10-CM | POA: Diagnosis present

## 2022-12-17 DIAGNOSIS — E872 Acidosis, unspecified: Secondary | ICD-10-CM | POA: Diagnosis present

## 2022-12-17 DIAGNOSIS — Z794 Long term (current) use of insulin: Secondary | ICD-10-CM | POA: Diagnosis not present

## 2022-12-17 DIAGNOSIS — G4730 Sleep apnea, unspecified: Secondary | ICD-10-CM | POA: Diagnosis present

## 2022-12-17 DIAGNOSIS — Z8249 Family history of ischemic heart disease and other diseases of the circulatory system: Secondary | ICD-10-CM | POA: Diagnosis not present

## 2022-12-17 DIAGNOSIS — E1142 Type 2 diabetes mellitus with diabetic polyneuropathy: Secondary | ICD-10-CM | POA: Diagnosis present

## 2022-12-17 DIAGNOSIS — Z833 Family history of diabetes mellitus: Secondary | ICD-10-CM | POA: Diagnosis not present

## 2022-12-17 DIAGNOSIS — K219 Gastro-esophageal reflux disease without esophagitis: Secondary | ICD-10-CM | POA: Diagnosis present

## 2022-12-17 DIAGNOSIS — M1990 Unspecified osteoarthritis, unspecified site: Secondary | ICD-10-CM | POA: Diagnosis present

## 2022-12-17 DIAGNOSIS — A419 Sepsis, unspecified organism: Secondary | ICD-10-CM | POA: Diagnosis not present

## 2022-12-17 DIAGNOSIS — I96 Gangrene, not elsewhere classified: Secondary | ICD-10-CM | POA: Diagnosis not present

## 2022-12-17 DIAGNOSIS — E11621 Type 2 diabetes mellitus with foot ulcer: Secondary | ICD-10-CM | POA: Diagnosis not present

## 2022-12-17 DIAGNOSIS — F17210 Nicotine dependence, cigarettes, uncomplicated: Secondary | ICD-10-CM | POA: Diagnosis present

## 2022-12-17 DIAGNOSIS — I70245 Atherosclerosis of native arteries of left leg with ulceration of other part of foot: Secondary | ICD-10-CM | POA: Diagnosis not present

## 2022-12-17 DIAGNOSIS — Z7984 Long term (current) use of oral hypoglycemic drugs: Secondary | ICD-10-CM

## 2022-12-17 DIAGNOSIS — Z7985 Long-term (current) use of injectable non-insulin antidiabetic drugs: Secondary | ICD-10-CM

## 2022-12-17 DIAGNOSIS — I959 Hypotension, unspecified: Secondary | ICD-10-CM | POA: Diagnosis present

## 2022-12-17 DIAGNOSIS — T8789 Other complications of amputation stump: Secondary | ICD-10-CM | POA: Diagnosis not present

## 2022-12-17 DIAGNOSIS — Z89422 Acquired absence of other left toe(s): Secondary | ICD-10-CM | POA: Diagnosis not present

## 2022-12-17 LAB — COMPREHENSIVE METABOLIC PANEL
ALT: 15 U/L (ref 0–44)
AST: 10 U/L — ABNORMAL LOW (ref 15–41)
Albumin: 3.5 g/dL (ref 3.5–5.0)
Alkaline Phosphatase: 72 U/L (ref 38–126)
Anion gap: 11 (ref 5–15)
BUN: 27 mg/dL — ABNORMAL HIGH (ref 8–23)
CO2: 22 mmol/L (ref 22–32)
Calcium: 8.9 mg/dL (ref 8.9–10.3)
Chloride: 101 mmol/L (ref 98–111)
Creatinine, Ser: 1.61 mg/dL — ABNORMAL HIGH (ref 0.61–1.24)
GFR, Estimated: 47 mL/min — ABNORMAL LOW (ref >60)
Glucose, Bld: 226 mg/dL — ABNORMAL HIGH (ref 70–99)
Potassium: 4.4 mmol/L (ref 3.5–5.1)
Sodium: 134 mmol/L — ABNORMAL LOW (ref 135–145)
Total Bilirubin: 0.6 mg/dL (ref 0.3–1.2)
Total Protein: 7.7 g/dL (ref 6.5–8.1)

## 2022-12-17 LAB — LACTIC ACID, PLASMA
Lactic Acid, Venous: 1.6 mmol/L (ref 0.5–1.9)
Lactic Acid, Venous: 2.2 mmol/L (ref 0.5–1.9)

## 2022-12-17 LAB — CBC
HCT: 45.5 % (ref 39.0–52.0)
Hemoglobin: 15.2 g/dL (ref 13.0–17.0)
MCH: 31.3 pg (ref 26.0–34.0)
MCHC: 33.4 g/dL (ref 30.0–36.0)
MCV: 93.6 fL (ref 80.0–100.0)
Platelets: 208 10*3/uL (ref 150–400)
RBC: 4.86 MIL/uL (ref 4.22–5.81)
RDW: 13.3 % (ref 11.5–15.5)
WBC: 7.2 10*3/uL (ref 4.0–10.5)
nRBC: 0 % (ref 0.0–0.2)

## 2022-12-17 LAB — CBC WITH DIFFERENTIAL/PLATELET
Abs Immature Granulocytes: 0.03 10*3/uL (ref 0.00–0.07)
Basophils Absolute: 0.1 10*3/uL (ref 0.0–0.1)
Basophils Relative: 1 %
Eosinophils Absolute: 0 10*3/uL (ref 0.0–0.5)
Eosinophils Relative: 1 %
HCT: 54 % — ABNORMAL HIGH (ref 39.0–52.0)
Hemoglobin: 17.9 g/dL — ABNORMAL HIGH (ref 13.0–17.0)
Immature Granulocytes: 1 %
Lymphocytes Relative: 25 %
Lymphs Abs: 1.7 10*3/uL (ref 0.7–4.0)
MCH: 32.3 pg (ref 26.0–34.0)
MCHC: 33.1 g/dL (ref 30.0–36.0)
MCV: 97.5 fL (ref 80.0–100.0)
Monocytes Absolute: 0.5 10*3/uL (ref 0.1–1.0)
Monocytes Relative: 8 %
Neutro Abs: 4.3 10*3/uL (ref 1.7–7.7)
Neutrophils Relative %: 64 %
Platelets: 220 10*3/uL (ref 150–400)
RBC: 5.54 MIL/uL (ref 4.22–5.81)
RDW: 13.4 % (ref 11.5–15.5)
WBC: 6.6 10*3/uL (ref 4.0–10.5)
nRBC: 0 % (ref 0.0–0.2)

## 2022-12-17 LAB — GLUCOSE, CAPILLARY: Glucose-Capillary: 302 mg/dL — ABNORMAL HIGH (ref 70–99)

## 2022-12-17 LAB — PROTIME-INR
INR: 1 (ref 0.8–1.2)
Prothrombin Time: 13.2 s (ref 11.4–15.2)

## 2022-12-17 LAB — APTT: aPTT: 30 s (ref 24–36)

## 2022-12-17 LAB — CREATININE, SERUM
Creatinine, Ser: 1.72 mg/dL — ABNORMAL HIGH (ref 0.61–1.24)
GFR, Estimated: 43 mL/min — ABNORMAL LOW (ref >60)

## 2022-12-17 MED ORDER — SODIUM CHLORIDE 0.9 % IV SOLN
2.0000 g | Freq: Two times a day (BID) | INTRAVENOUS | Status: DC
  Administered 2022-12-17 – 2022-12-18 (×3): 2 g via INTRAVENOUS
  Filled 2022-12-17 (×4): qty 12.5

## 2022-12-17 MED ORDER — VANCOMYCIN HCL IN DEXTROSE 1-5 GM/200ML-% IV SOLN
1000.0000 mg | Freq: Once | INTRAVENOUS | Status: AC
  Administered 2022-12-17: 1000 mg via INTRAVENOUS
  Filled 2022-12-17: qty 200

## 2022-12-17 MED ORDER — INSULIN ASPART 100 UNIT/ML IJ SOLN
0.0000 [IU] | Freq: Three times a day (TID) | INTRAMUSCULAR | Status: DC
  Administered 2022-12-18: 3 [IU] via SUBCUTANEOUS
  Administered 2022-12-18: 2 [IU] via SUBCUTANEOUS
  Filled 2022-12-17 (×2): qty 1

## 2022-12-17 MED ORDER — HEPARIN SODIUM (PORCINE) 5000 UNIT/ML IJ SOLN
5000.0000 [IU] | Freq: Three times a day (TID) | INTRAMUSCULAR | Status: DC
  Administered 2022-12-17 – 2022-12-18 (×4): 5000 [IU] via SUBCUTANEOUS
  Filled 2022-12-17 (×5): qty 1

## 2022-12-17 MED ORDER — OXYCODONE HCL 5 MG PO TABS
20.0000 mg | ORAL_TABLET | Freq: Four times a day (QID) | ORAL | Status: DC | PRN
  Administered 2022-12-17 – 2022-12-20 (×6): 20 mg via ORAL
  Filled 2022-12-17 (×7): qty 4

## 2022-12-17 MED ORDER — SODIUM CHLORIDE 0.9 % IV BOLUS
1500.0000 mL | Freq: Once | INTRAVENOUS | Status: AC
  Administered 2022-12-17: 1500 mL via INTRAVENOUS

## 2022-12-17 MED ORDER — VANCOMYCIN HCL 1500 MG/300ML IV SOLN
1500.0000 mg | Freq: Once | INTRAVENOUS | Status: AC
  Administered 2022-12-17: 1500 mg via INTRAVENOUS
  Filled 2022-12-17: qty 300

## 2022-12-17 MED ORDER — PANTOPRAZOLE SODIUM 40 MG PO TBEC
40.0000 mg | DELAYED_RELEASE_TABLET | Freq: Every day | ORAL | Status: DC
  Administered 2022-12-17 – 2022-12-20 (×3): 40 mg via ORAL
  Filled 2022-12-17 (×4): qty 1

## 2022-12-17 MED ORDER — SIMVASTATIN 20 MG PO TABS
40.0000 mg | ORAL_TABLET | Freq: Every day | ORAL | Status: DC
  Administered 2022-12-17 – 2022-12-20 (×2): 40 mg via ORAL
  Filled 2022-12-17 (×3): qty 2

## 2022-12-17 MED ORDER — INSULIN ASPART 100 UNIT/ML IJ SOLN
0.0000 [IU] | Freq: Every day | INTRAMUSCULAR | Status: DC
  Administered 2022-12-17: 4 [IU] via SUBCUTANEOUS
  Filled 2022-12-17: qty 1

## 2022-12-17 MED ORDER — INSULIN DETEMIR 100 UNIT/ML ~~LOC~~ SOLN
15.0000 [IU] | Freq: Every day | SUBCUTANEOUS | Status: DC
  Administered 2022-12-18 – 2022-12-19 (×2): 15 [IU] via SUBCUTANEOUS
  Filled 2022-12-17 (×3): qty 0.15

## 2022-12-17 MED ORDER — PIPERACILLIN-TAZOBACTAM 3.375 G IVPB 30 MIN
3.3750 g | Freq: Once | INTRAVENOUS | Status: AC
  Administered 2022-12-17: 3.375 g via INTRAVENOUS
  Filled 2022-12-17: qty 50

## 2022-12-17 MED ORDER — VANCOMYCIN HCL 1250 MG/250ML IV SOLN
1250.0000 mg | INTRAVENOUS | Status: DC
  Administered 2022-12-18: 1250 mg via INTRAVENOUS
  Filled 2022-12-17: qty 250

## 2022-12-17 NOTE — ED Provider Notes (Signed)
[facility] Provider Note    Event Date/Time   First MD Initiated Contact with Patient 12/17/22 1628     (approximate)   History   Wound Check   HPI  LOUIE ODEGAARD is a 67 y.o. male   Past medical history of diabetes, hypertension hyperlipidemia presents to the emergency department from podiatry clinic for concerns of dry gangrene and cold foot.  Had a toe amputation of the left great toe performed at podiatry earlier this month.  Presents hypotensive 80s over 60s.  He denies any pain to the foot though has sensation deficits longstanding.  Independent Historian contributed to assessment above: I spoke with Dr. Excell Seltzer of podiatry regarding the above       Physical Exam   Triage Vital Signs: ED Triage Vitals  Encounter Vitals Group     BP 12/17/22 1622 (!) 88/59     Systolic BP Percentile --      Diastolic BP Percentile --      Pulse Rate 12/17/22 1617 95     Resp 12/17/22 1617 17     Temp 12/17/22 1617 98.5 F (36.9 C)     Temp Source 12/17/22 1617 Oral     SpO2 12/17/22 1617 99 %     Weight 12/17/22 1618 249 lb (112.9 kg)     Height 12/17/22 1618 5\' 11"  (1.803 m)     Head Circumference --      Peak Flow --      Pain Score 12/17/22 1618 8     Pain Loc --      Pain Education --      Exclude from Growth Chart --     Most recent vital signs: Vitals:   12/17/22 2130 12/17/22 2202  BP: 108/70 111/68  Pulse: 94 93  Resp: 15 20  Temp:  98.3 F (36.8 C)  SpO2: 96% 97%    General: Awake, no distress.  CV:  Good peripheral perfusion.  Resp:  Normal effort.  Abd:  No distention.  Other:  Left great toe wound status post surgery with no noted cellulitis or purulence, dopplerable pulses though in both extremities, both extremities cool to the touch   ED Results / Procedures / Treatments   Labs (all labs ordered are listed, but only abnormal results are displayed) Labs Reviewed  CBC WITH DIFFERENTIAL/PLATELET - Abnormal; Notable for  the following components:      Result Value   Hemoglobin 17.9 (*)    HCT 54.0 (*)    All other components within normal limits  COMPREHENSIVE METABOLIC PANEL - Abnormal; Notable for the following components:   Sodium 134 (*)    Glucose, Bld 226 (*)    BUN 27 (*)    Creatinine, Ser 1.61 (*)    AST 10 (*)    GFR, Estimated 47 (*)    All other components within normal limits  LACTIC ACID, PLASMA - Abnormal; Notable for the following components:   Lactic Acid, Venous 2.2 (*)    All other components within normal limits  CREATININE, SERUM - Abnormal; Notable for the following components:   Creatinine, Ser 1.72 (*)    GFR, Estimated 43 (*)    All other components within normal limits  GLUCOSE, CAPILLARY - Abnormal; Notable for the following components:   Glucose-Capillary 302 (*)    All other components within normal limits  CULTURE, BLOOD (ROUTINE X 2)  CULTURE, BLOOD (ROUTINE X 2)  LACTIC ACID, PLASMA  PROTIME-INR  APTT  CBC  BASIC METABOLIC PANEL  CBC  LACTIC ACID, PLASMA     I ordered and reviewed the above labs they are notable for white blood cell count within normal limits.  Lactic is 2.2  EKG  ED ECG REPORT I, Pilar Jarvis, the attending physician, personally viewed and interpreted this ECG.   Date: 12/17/2022  EKG Time: 1647  Rate: 93  Rhythm: sinus  Axis: nl  Intervals:none  ST&T Change: no stemi    RADIOLOGY I independently reviewed and interpreted xr  the foot and see no erosive bony changes I also reviewed radiologist's formal read.   PROCEDURES:  Critical Care performed: Yes, see critical care procedure note(s)  .Critical Care  Performed by: Pilar Jarvis, MD Authorized by: Pilar Jarvis, MD   Critical care provider statement:    Critical care time (minutes):  30   Critical care was time spent personally by me on the following activities:  Development of treatment plan with patient or surrogate, discussions with consultants, evaluation of patient's  response to treatment, examination of patient, ordering and review of laboratory studies, ordering and review of radiographic studies, ordering and performing treatments and interventions, pulse oximetry, re-evaluation of patient's condition and review of old charts    MEDICATIONS ORDERED IN ED: Medications  heparin injection 5,000 Units (5,000 Units Subcutaneous Given 12/17/22 2300)  ceFEPIme (MAXIPIME) 2 g in sodium chloride 0.9 % 100 mL IVPB (2 g Intravenous New Bag/Given 12/17/22 2309)  oxyCODONE (Oxy IR/ROXICODONE) immediate release tablet 20 mg (20 mg Oral Given 12/17/22 2306)  simvastatin (ZOCOR) tablet 40 mg (40 mg Oral Given 12/17/22 2300)  pantoprazole (PROTONIX) EC tablet 40 mg (40 mg Oral Given 12/17/22 2300)  insulin detemir (LEVEMIR) injection 15 Units (has no administration in time range)  insulin aspart (novoLOG) injection 0-9 Units (has no administration in time range)  insulin aspart (novoLOG) injection 0-5 Units (4 Units Subcutaneous Given 12/17/22 2300)  vancomycin (VANCOREADY) IVPB 1250 mg/250 mL (has no administration in time range)  sodium chloride 0.9 % bolus 1,500 mL (0 mLs Intravenous Stopped 12/17/22 2102)  piperacillin-tazobactam (ZOSYN) IVPB 3.375 g (0 g Intravenous Stopped 12/17/22 1743)  vancomycin (VANCOCIN) IVPB 1000 mg/200 mL premix (0 mg Intravenous Stopped 12/17/22 1846)  vancomycin (VANCOREADY) IVPB 1500 mg/300 mL (0 mg Intravenous Stopped 12/17/22 2102)    External physician / consultants:  I spoke with the Baker of podiatry and hospitalist medicine physician regarding care plan for this patient.   IMPRESSION / MDM / ASSESSMENT AND PLAN / ED COURSE  I reviewed the triage vital signs and the nursing notes.                                Patient's presentation is most consistent with acute presentation with potential threat to life or bodily function.  Differential diagnosis includes, but is not limited to, dry gangrene, sepsis, osteomyelitis,  ischemic foot   The patient is on the cardiac monitor to evaluate for evidence of arrhythmia and/or significant heart rate changes.  MDM:    Dopplerable pulses doubt ischemia, though has evidence of sepsis with hypotension, lactic acidosis, foot wound infection so initiated 30 cc/kg ideal body weight fluid bolus along with IV vancomycin and Zosyn.  Admission.       FINAL CLINICAL IMPRESSION(S) / ED DIAGNOSES   Final diagnoses:  Dry gangrene (HCC)  Sepsis, due to unspecified organism, unspecified whether acute organ dysfunction present (HCC)  Rx / DC Orders   ED Discharge Orders     None        Note:  This document was prepared using Dragon voice recognition software and may include unintentional dictation errors.    Pilar Jarvis, MD 12/17/22 [phone]

## 2022-12-17 NOTE — H&P (Signed)
History and Physical    Patient: Corey Foster GYI:[id number] DOB: May 16, 1955 DOA: 12/17/2022 DOS: the patient was seen and examined on 12/17/2022 PCP: Emogene Morgan, MD  Patient coming from: Home  Chief Complaint: Left foot infection Chief Complaint  Patient presents with   Wound Check   HPI: Corey Foster is a 67 y.o. male with medical history significant of diabetes mellitus, degenerative joint disease, hypertension, hyperlipidemia, GERD, sleep apnea who follows up with podiatry Dr. Excell Seltzer.  Patient underwent a left first toe amputation about 2 to 3 weeks ago by podiatrist Dr. Excell Seltzer and around that time patient underwent angiogram by Dr. Wyn Quaker vascular surgeon.  Postoperatively patient remained stable was discharged and asked to follow-up with podiatrist as an outpatient.  Patient followed up with podiatry today and found to have infection on the left foot and therefore brought in by podiatrist from the clinic to the emergency room for admission for IV antibiotic therapy as well as vascular surgeons consultation.  Patient denies nausea vomiting abdominal pain chest pain or cough.  Upon arrival to the emergency room ED physician discussed the case with vascular surgeon who agreed to see patient in consultation. ED course: Vitals on arrival to the ED was temperature 98.5 pulse 94 respiratory rate 17 blood pressure 125/76 saturating 99% on room air.  Initially upon arrival patient's systolic blood pressure was 88.  Patient received IV fluid bolus in the emergency room Hospitalist was therefore contacted to admit patient for further management  Review of Systems: As mentioned in the history of present illness. All other systems reviewed and are negative. 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   Sleep apnea    Past Surgical History:  Procedure Laterality Date   AMPUTATION Left 11/29/2022   Procedure: AMPUTATION RAY, PARTIAL 1ST RAY;  Surgeon: Rosetta Posner, DPM;  Location: [facility] ORS;  Service: Orthopedics/Podiatry;  Laterality: Left;   CYSTOSCOPY W/ RETROGRADES Bilateral 05/31/2019   Procedure: CYSTOSCOPY WITH RETROGRADE PYELOGRAM;  Surgeon: Riki Altes, MD;  Location: [facility] ORS;  Service: Urology;  Laterality: Bilateral;   LOWER EXTREMITY ANGIOGRAPHY Left 11/28/2022   Procedure: Lower Extremity Angiography;  Surgeon: Annice Needy, MD;  Location: [facility] INVASIVE CV LAB;  Service: Cardiovascular;  Laterality: Left;   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-C N/A 05/31/2019   Procedure: 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 He has a 10 pack-year smoking history. He has never used smokeless tobacco. He reports current alcohol use of about 18.0 standard drinks of alcohol per week. He reports that he does not currently use drugs after having used the following drugs: Cocaine and Marijuana.  Allergies  Allergen Reactions   Nsaids Other (See Comments)    AKI AKI     Family History  Problem Relation Age of Onset   Diabetes Mother    Heart disease Mother    Diabetes Father    Heart disease Father    Diabetes Brother     Prior to Admission medications   Medication Sig Start Date End Date Taking? Authorizing Provider  albuterol (  VENTOLIN HFA) 108 (90 Base) MCG/ACT inhaler Inhale 1 puff into the lungs every 6 (six) hours as needed for wheezing or shortness of breath.  02/15/13   [provider]  ALPRAZolam Prudy Feeler) 1 MG tablet Take 1 mg by mouth 4 (four) times daily as needed. 08/27/19   [provider]  AMBULATORY NON FORMULARY MEDICATION Trimix  (30/1/10)-(Pap/Phent/PGE)  Dosage: Inject 0.3 cc and may increase 0.1cc to achieve and erection lasting no longer than 1 hour per injection   Vial 1ml  Qty #5 refills 2  [facility] [phone] Fax [phone] 02/13/22   Riki Altes, MD  aspirin EC 325 MG tablet Take 325 mg by mouth daily.    [provider]  baclofen (LIORESAL) 10 MG tablet Take 10 mg by mouth 2 (two) times daily.  10/07/13   [provider]  cholecalciferol (VITAMIN D) 400 UNITS TABS tablet Take 10,000 Units by mouth daily.     [provider]  furosemide (LASIX) 20 MG tablet Take 20 mg by mouth daily. 01/15/21   [provider]  JARDIANCE 25 MG TABS tablet Take 25 mg by mouth daily. 06/21/21   [provider]  losartan (COZAAR) 100 MG tablet Take 100 mg by mouth daily.  09/25/17   [provider]  naloxone (NARCAN) 0.4 MG/ML injection Inject as directed as directed    [provider]  NOVOLOG FLEXPEN 100 UNIT/ML FlexPen Inject 40 Units into the skin 3 (three) times daily with meals. 11/29/22   Marrion Coy, MD  Nutritional Supplements (KETO PO) Take 1 capsule by mouth.    [provider]  Omega-3 Fatty Acids (FISH OIL) 1000 MG CAPS Take 1 capsule by mouth daily.  01/14/12   [provider]  Oxycodone HCl 20 MG TABS Take 20 mg by mouth 4 (four) times daily as needed (pain).  06/20/15   [provider]  sildenafil (REVATIO) 20 MG tablet TAKE 2 TO 5 TABLETS BY MOUTH 1 HOUR PRIOR TO INTERCOURSE 10/09/22   Stoioff, Verna Czech, MD  simvastatin (ZOCOR) 40 MG tablet Take 1 tablet (40 mg total) by mouth daily. 11/29/22   Marrion Coy, MD  Testosterone 20.25 MG/ACT (1.62%) GEL APPLY 4 APPLICATIONS TOPICALLY DAILY. 05/12/22   Stoioff, Verna Czech, MD  TOUJEO MAX SOLOSTAR 300 UNIT/ML Solostar Pen Inject 80-120 Units into the skin in the morning and at bedtime. Inject 80 Units into the skin daily. 80 units in the am and 120 units at bedtime     [provider]  TRULICITY 1.5 MG/0.5ML SOPN Inject 1.5 mg into the skin once a week. 01/16/21   [provider]  vitamin C (ASCORBIC ACID) 250 MG tablet Take 250 mg by mouth daily.    [provider]    Physical Exam: General: Elderly male laying in bed in no acute distress CNS: Alert and oriented x 3 able to move all extremities CVS: S1-S2 present no murmur Abdomen: Abdomen obese nontender nondistended, full Musculoskeletal: Ulceration noted to the stump area on the left big toe amputation site HEENT: Atraumatic normocephalic Vitals:   12/17/22 1618 12/17/22 1622 12/17/22 1700 12/17/22 1740  BP:  (!) 88/59 108/76 125/71  Pulse:   94   Resp:   19   Temp:      TempSrc:      SpO2:   95%   Weight: 112.9 kg     Height: 5\' 11"  (1.803 m)       Data Reviewed: I have  reviewed patient's lab results as shown below including CBC and CMP results I have reviewed ED physician documentation, nursing documentation  Assessment and Plan:  Left diabetic foot infection Podiatry consulted as well as vascular surgery We will cover with broad-spectrum antibiotics for now involving vancomycin and cefepime Follow-up on culture results Plan of care discussed with ED physician Foot x-rays pending we will follow-up closely  Diabetes mellitus type II with hyperglycemia Monitor glucose level closely Last A1c 11/27/2022 was greater than 15.5 Continue glucose monitoring Continue current insulin therapy  Hypertension Blood pressure soft on admission We will hold antihypertensives at this time Monitor blood pressure closely  Hyperlipidemia-continue statin therapy  GERD -Continue PPI therapy  Sleep apnea  VTE prophylaxis:-Continue subcutaneous heparin    Advance Care Planning:   Code Status: Prior full code  Consults: Podiatry, vascular surgery  Family Communication: No family present at bedside  Severity of Illness: The appropriate patient status for this  patient is INPATIENT. Inpatient status is judged to be reasonable and necessary in order to provide the required intensity of service to ensure the patient's safety. The patient's presenting symptoms, physical exam findings, and initial radiographic and laboratory data in the context of their chronic comorbidities is felt to place them at high risk for further clinical deterioration. Furthermore, it is not anticipated that the patient will be medically stable for discharge from the hospital within 2 midnights of admission.   * I certify that at the point of admission it is my clinical judgment that the patient will require inpatient hospital care spanning beyond 2 midnights from the point of admission due to high intensity of service, high risk for further deterioration and high frequency of surveillance required.*  Author: Loyce Dys, MD 12/17/2022 5:55 PM  For on call review [email or web address].

## 2022-12-17 NOTE — ED Notes (Signed)
Pt repositioned, blanket and sheet given. Call light within reach, bed in lowest position

## 2022-12-17 NOTE — ED Notes (Signed)
This RN communicated with provider about critical lactate of 2.2. Provider said we will repeat lactate in the morning. Continue to monitor pt's vitals if he starts to be tachycardic or low BP then call provider Jon Billings and she will reassess.

## 2022-12-17 NOTE — ED Triage Notes (Signed)
Pt presents to ED with c/o possible vascular or infection to L great toe. NAD noted. Pt states sent from Dr. Odessa Fleming office. NAD noted. Pt denies fevers or chills.

## 2022-12-17 NOTE — Progress Notes (Signed)
Pharmacy Antibiotic Note  Corey Foster is a 67 y.o. male presenting from outpatient podiatry clinic on 12/17/2022 with left foot infection. PMH significant for T2DM, CKD III, HTN, and HLD. Patient underwent lower extremity angiogram on 11/28/22 where he was diagnosed with PAD and gangrene to left great toe. In ED, patient experiencing swelling and drainage at site of left great toe amputation (11/29/22). Patient remains afebrile with no leukocytosis. Pharmacy has been consulted for vancomycin and cefepime dosing.  Plan: Vancomycin load of 2500 mg IV given in ED Start vancomycin 1250 mg IV every 24 hours (eAUC 506.0, Cmin 12.7, IBW, Scr 1.61, Vd 0.5 L/kg) Start cefepime 2 g IV every 12 hours Monitor renal function, clinical status, culture data, and LOT F/u imaging of foot and podiatry/vascular consults  Height: 5\' 11"  (180.3 cm) Weight: 112.9 kg (249 lb) IBW/kg (Calculated) : 75.3  Temp (24hrs), Avg:98.5 F (36.9 C), Min:98.5 F (36.9 C), Max:98.5 F (36.9 C)  Recent Labs  Lab 12/17/22 1622  WBC 6.6  CREATININE 1.61*  LATICACIDVEN 1.6    Estimated Creatinine Clearance: 56.9 mL/min (A) (by C-G formula based on SCr of 1.61 mg/dL (H)).    Allergies  Allergen Reactions   Nsaids Other (See Comments)    AKI AKI    Antimicrobials this admission: Zosyn 10/23 x1 Cefepime 10/23 >>  Vancomycin 10/23 >>  Dose adjustments this admission: N/A  Microbiology results: 10/23 BCx: pending  Thank you for involving pharmacy in this patient's care.   Rockwell Alexandria, PharmD Clinical Pharmacist 12/17/2022 7:12 PM

## 2022-12-17 NOTE — Consult Note (Signed)
PHARMACY -  BRIEF ANTIBIOTIC NOTE   Pharmacy has received consult(s) for sepsis from an ED provider.  The patient's profile has been reviewed for ht/wt/allergies/indication/available labs.    One time order(s) placed for vancomycin  Further antibiotics/pharmacy consults should be ordered by admitting physician if indicated.                       Thank you, Ronnald Ramp 12/17/2022  6:11 PM

## 2022-12-17 NOTE — ED Notes (Signed)
Informed RN Megan via chat/ pt has bed assigned

## 2022-12-18 ENCOUNTER — Encounter
Admission: EM | Disposition: A | Discharge status: Home / Self Care | Payer: Self-pay | Source: Home / Self Care | Attending: Internal Medicine

## 2022-12-18 DIAGNOSIS — I70245 Atherosclerosis of native arteries of left leg with ulceration of other part of foot: Secondary | ICD-10-CM

## 2022-12-18 DIAGNOSIS — A419 Sepsis, unspecified organism: Secondary | ICD-10-CM | POA: Diagnosis not present

## 2022-12-18 DIAGNOSIS — L97529 Non-pressure chronic ulcer of other part of left foot with unspecified severity: Secondary | ICD-10-CM | POA: Diagnosis not present

## 2022-12-18 DIAGNOSIS — I96 Gangrene, not elsewhere classified: Secondary | ICD-10-CM | POA: Diagnosis not present

## 2022-12-18 DIAGNOSIS — T8789 Other complications of amputation stump: Secondary | ICD-10-CM | POA: Diagnosis not present

## 2022-12-18 DIAGNOSIS — L089 Local infection of the skin and subcutaneous tissue, unspecified: Secondary | ICD-10-CM | POA: Diagnosis not present

## 2022-12-18 DIAGNOSIS — Z89422 Acquired absence of other left toe(s): Secondary | ICD-10-CM

## 2022-12-18 HISTORY — PX: LOWER EXTREMITY ANGIOGRAPHY: CATH118251

## 2022-12-18 LAB — CBC
HCT: 43.5 % (ref 39.0–52.0)
Hemoglobin: 15 g/dL (ref 13.0–17.0)
MCH: 32.1 pg (ref 26.0–34.0)
MCHC: 34.5 g/dL (ref 30.0–36.0)
MCV: 93.1 fL (ref 80.0–100.0)
Platelets: 219 10*3/uL (ref 150–400)
RBC: 4.67 MIL/uL (ref 4.22–5.81)
RDW: 13.3 % (ref 11.5–15.5)
WBC: 7.6 10*3/uL (ref 4.0–10.5)
nRBC: 0 % (ref 0.0–0.2)

## 2022-12-18 LAB — LACTIC ACID, PLASMA: Lactic Acid, Venous: 1.1 mmol/L (ref 0.5–1.9)

## 2022-12-18 LAB — GLUCOSE, CAPILLARY
Glucose-Capillary: 210 mg/dL — ABNORMAL HIGH (ref 70–99)
Glucose-Capillary: 200 mg/dL — ABNORMAL HIGH (ref 70–99)
Glucose-Capillary: 164 mg/dL — ABNORMAL HIGH (ref 70–99)
Glucose-Capillary: 97 mg/dL (ref 70–99)
Glucose-Capillary: 121 mg/dL — ABNORMAL HIGH (ref 70–99)
Glucose-Capillary: 85 mg/dL (ref 70–99)

## 2022-12-18 LAB — BASIC METABOLIC PANEL
Anion gap: 9 (ref 5–15)
BUN: 27 mg/dL — ABNORMAL HIGH (ref 8–23)
CO2: 22 mmol/L (ref 22–32)
Calcium: 8.2 mg/dL — ABNORMAL LOW (ref 8.9–10.3)
Chloride: 106 mmol/L (ref 98–111)
Creatinine, Ser: 1.56 mg/dL — ABNORMAL HIGH (ref 0.61–1.24)
GFR, Estimated: 48 mL/min — ABNORMAL LOW (ref >60)
Glucose, Bld: 203 mg/dL — ABNORMAL HIGH (ref 70–99)
Potassium: 3.9 mmol/L (ref 3.5–5.1)
Sodium: 137 mmol/L (ref 135–145)

## 2022-12-18 LAB — SURGICAL PCR SCREEN
MRSA, PCR: NEGATIVE
Staphylococcus aureus: NEGATIVE

## 2022-12-18 SURGERY — LOWER EXTREMITY ANGIOGRAPHY
Anesthesia: Moderate Sedation | Laterality: Left

## 2022-12-18 MED ORDER — CEFAZOLIN SODIUM-DEXTROSE 2-4 GM/100ML-% IV SOLN: 2.0000 g | INTRAVENOUS | Status: DC

## 2022-12-18 MED ORDER — CLOPIDOGREL BISULFATE 75 MG PO TABS
75.0000 mg | ORAL_TABLET | Freq: Every day | ORAL | Status: DC
  Administered 2022-12-18: 75 mg via ORAL

## 2022-12-18 MED ORDER — ASPIRIN 81 MG PO TBEC
DELAYED_RELEASE_TABLET | ORAL | Status: AC
  Filled 2022-12-18: qty 1

## 2022-12-18 MED ORDER — PHENYLEPHRINE HCL-NACL 20-0.9 MG/250ML-% IV SOLN
INTRAVENOUS | Status: AC
  Filled 2022-12-18: qty 250

## 2022-12-18 MED ORDER — CLOPIDOGREL BISULFATE 75 MG PO TABS
ORAL_TABLET | ORAL | Status: AC
  Filled 2022-12-18: qty 1

## 2022-12-18 MED ORDER — IODIXANOL 320 MG/ML IV SOLN
INTRAVENOUS | Status: DC | PRN
  Administered 2022-12-18: 70 mL via INTRA_ARTERIAL

## 2022-12-18 MED ORDER — LIDOCAINE-EPINEPHRINE (PF) 1 %-1:200000 IJ SOLN
INTRAMUSCULAR | Status: DC | PRN
  Administered 2022-12-18: 10 mL via INTRADERMAL

## 2022-12-18 MED ORDER — HEPARIN (PORCINE) IN NACL 1000-0.9 UT/500ML-% IV SOLN
INTRAVENOUS | Status: DC | PRN
  Administered 2022-12-18: 1000 mL

## 2022-12-18 MED ORDER — FENTANYL CITRATE (PF) 100 MCG/2ML IJ SOLN
INTRAMUSCULAR | Status: DC | PRN
  Administered 2022-12-18: 50 ug via INTRAVENOUS

## 2022-12-18 MED ORDER — FENTANYL CITRATE (PF) 100 MCG/2ML IJ SOLN
INTRAMUSCULAR | Status: AC
  Filled 2022-12-18: qty 2

## 2022-12-18 MED ORDER — CHLORHEXIDINE GLUCONATE 4 % EX SOLN: 60.0000 mL | Freq: Once | CUTANEOUS | Status: DC

## 2022-12-18 MED ORDER — ASPIRIN 81 MG PO TBEC
81.0000 mg | DELAYED_RELEASE_TABLET | Freq: Every day | ORAL | Status: DC
  Administered 2022-12-18 – 2022-12-20 (×2): 81 mg via ORAL
  Filled 2022-12-18 (×2): qty 1

## 2022-12-18 MED ORDER — HEPARIN SODIUM (PORCINE) 1000 UNIT/ML IJ SOLN
INTRAMUSCULAR | Status: AC
Start: 2022-12-18 — End: ?
  Filled 2022-12-18: qty 10

## 2022-12-18 MED ORDER — LACTULOSE 10 GM/15ML PO SOLN
30.0000 g | Freq: Two times a day (BID) | ORAL | Status: DC | PRN
  Administered 2022-12-19 – 2022-12-20 (×2): 30 g via ORAL
  Filled 2022-12-18 (×2): qty 60

## 2022-12-18 MED ORDER — MIDAZOLAM HCL 5 MG/5ML IJ SOLN
INTRAMUSCULAR | Status: AC
  Filled 2022-12-18: qty 5

## 2022-12-18 MED ORDER — PHENYLEPHRINE HCL-NACL 20-0.9 MG/250ML-% IV SOLN
INTRAVENOUS | Status: DC | PRN
  Administered 2022-12-18: 10 ug/min via INTRAVENOUS

## 2022-12-18 MED ORDER — MIDAZOLAM HCL 2 MG/2ML IJ SOLN
INTRAMUSCULAR | Status: DC | PRN
  Administered 2022-12-18: 2 mg via INTRAVENOUS

## 2022-12-18 MED ORDER — HEPARIN SODIUM (PORCINE) 1000 UNIT/ML IJ SOLN
INTRAMUSCULAR | Status: DC | PRN
  Administered 2022-12-18: 5000 [IU] via INTRAVENOUS

## 2022-12-18 SURGICAL SUPPLY — 18 items
BALLN ARMADA 1.5X120X150 (BALLOONS) ×1
BALLN ULTRVRSE 2X220X150 (BALLOONS) ×1
BALLOON ARMADA 1.5X120X150 (BALLOONS) IMPLANT
BALLOON ULTRVRSE 2X220X150 (BALLOONS) IMPLANT
CATH ANGIO 5F PIGTAIL 65CM (CATHETERS) IMPLANT
CATH NAVICROSS ANGLED 135CM (MICROCATHETER) IMPLANT
COVER PROBE ULTRASOUND 5X96 (MISCELLANEOUS) IMPLANT
DEVICE STARCLOSE SE CLOSURE (Vascular Products) IMPLANT
GLIDEWIRE ADV .035X260CM (WIRE) IMPLANT
GUIDEWIRE PFTE-COATED .018X300 (WIRE) IMPLANT
KIT ENCORE 26 ADVANTAGE (KITS) IMPLANT
PACK ANGIOGRAPHY (CUSTOM PROCEDURE TRAY) ×2 IMPLANT
SHEATH BRITE TIP 5FRX11 (SHEATH) IMPLANT
SHEATH RAABE 6FRX70 (SHEATH) IMPLANT
SYR MEDRAD MARK 7 150ML (SYRINGE) IMPLANT
TUBING CONTRAST HIGH PRESS 72 (TUBING) IMPLANT
WIRE GUIDERIGHT .035X150 (WIRE) IMPLANT
WIRE RUNTHROUGH .014X300CM (WIRE) IMPLANT

## 2022-12-18 NOTE — Progress Notes (Signed)
Left foot worse and readmitted to the hospital.  Asked to see patient and given his PAD and tibial disease, will plan angiogram today for reevaluation.

## 2022-12-18 NOTE — Progress Notes (Addendum)
Progress Note   Patient: Corey Foster:[id number] DOB: 01-08-1956 DOA: 12/17/2022     1 DOS: the patient was seen and examined on 12/18/2022     Subjective:  Patient seen and examined at bedside this morning Denies nausea vomiting abdominal pain chest pain or cough Underwent angiogram by vascular surgeon today  Brief hospital course:  Corey Foster is a 67 y.o. male with medical history significant of diabetes mellitus, degenerative joint disease, hypertension, hyperlipidemia, GERD, sleep apnea who follows up with podiatry Dr. Excell Seltzer.  Patient underwent a left first toe amputation about 2 to 3 weeks ago by podiatrist Dr. Excell Seltzer and around that time patient underwent angiogram by Dr. Wyn Quaker vascular surgeon.  Postoperatively patient remained stable was discharged and asked to follow-up with podiatrist as an outpatient.  Patient followed up with podiatry today and found to have infection on the left foot and therefore brought in by podiatrist from the clinic to the emergency room for admission for IV antibiotic therapy as well as vascular surgeons consultation.  Patient denies nausea vomiting abdominal pain chest pain or cough.  Upon arrival to the emergency room ED physician discussed the case with vascular surgeon who agreed to see patient in consultation. ED course: Vitals on arrival to the ED was temperature 98.5 pulse 94 respiratory rate 17 blood pressure 125/76 saturating 99% on room air.  Initially upon arrival patient's systolic blood pressure was 88.  Patient received IV fluid bolus in the emergency room Hospitalist was therefore contacted to admit patient for further management    Assessment and Plan:  Left diabetic foot infection Podiatry consulted as well as vascular surgery Continue broad-spectrum antibiotics Follow-up on culture results Plan of care discussed with ED physician Foot x-rays showing findings of soft tissue wound without findings of acute bony involvement And of  care discussed with vascular surgery as well as podiatry   Diabetes mellitus type II with hyperglycemia Monitor glucose level closely Last A1c 11/27/2022 was greater than 15.5 Continue glucose monitoring Continue current insulin therapy   Hypertension Blood pressure soft on admission We will hold antihypertensives at this time Monitor blood pressure closely   Hyperlipidemia-continue statin therapy   GERD -Continue PPI therapy   Sleep apnea   VTE prophylaxis:-Continue subcutaneous heparin      Advance Care Planning:   Code Status: Prior full code   Consults: Podiatry, vascular surgery   Family Communication: No family present at bedside  Data Reviewed: I have reviewed patient's x-ray of the fourth report showing soft tissue swelling without bony involvement, I have reviewed patient's vitals as well as labs including CBC and CMP, reviewed podiatry and vascular surgeon documentation    Time spent: 55 minutes  Physical Exam:  General: Elderly male laying in bed in no acute distress CNS: Alert and oriented x 3 able to move all extremities CVS: S1-S2 present no murmur Abdomen: Abdomen obese nontender nondistended, full Musculoskeletal: Ulceration noted to the stump area on the left big toe amputation site HEENT: Atraumatic normocephalic Vitals:   12/18/22 1140 12/18/22 1150 12/18/22 1237 12/18/22 1311  BP: 103/62 95/67 109/68 (!) 127/57  Pulse: 88 89 88 64  Resp: (!) 6 (!) 6 18 17   Temp:   98.6 F (37 C) 97.7 F (36.5 C)  TempSrc:   Oral   SpO2: 94% 95% 97% 100%  Weight:      Height:          Latest Ref Rng & Units 12/18/2022    2:35  AM 12/17/2022    8:36 PM 12/17/2022    4:22 PM  BMP  Glucose 70 - 99 mg/dL 161   096   BUN 8 - 23 mg/dL 27   27   Creatinine 0.45 - 1.24 mg/dL 4.09  8.11  9.14   Sodium 135 - 145 mmol/L 137   134   Potassium 3.5 - 5.1 mmol/L 3.9   4.4   Chloride 98 - 111 mmol/L 106   101   CO2 22 - 32 mmol/L 22   22   Calcium 8.9 - 10.3 mg/dL  8.2   8.9        Latest Ref Rng & Units 12/18/2022    2:35 AM 12/17/2022    8:36 PM 12/17/2022    4:22 PM  CBC  WBC 4.0 - 10.5 K/uL 7.6  7.2  6.6   Hemoglobin 13.0 - 17.0 g/dL 78.2  95.6  21.3   Hematocrit 39.0 - 52.0 % 43.5  45.5  54.0   Platelets 150 - 400 K/uL 219  208  220      Author: Loyce Dys, MD 12/18/2022 3:28 PM  For on call review [email or web address].

## 2022-12-18 NOTE — Consult Note (Signed)
ORTHOPAEDIC CONSULTATION  REQUESTING PHYSICIAN: Loyce Dys, MD  Chief Complaint: Gangrene left great toe amputation site  HPI: Corey Foster is a 67 y.o. male who complains of worsening wound to his left great toe.  Patient underwent partial first ray amputation previously.  Seen in outpatient clinic with worsening gangrenous changes to the first ray amputation site and admitted to the hospital for reevaluation of vasculature as well as plan for revision of first ray region.  Patient just underwent revascularization through vascular surgery.  Resting comfortably in bed.  No complaints currently.  Past Medical History:  Diagnosis Date   Arthritis    Diabetes mellitus without complication (HCC)    DJD (degenerative joint disease)    GERD (gastroesophageal reflux disease)    Hyperlipidemia    Hypertension    Sciatic nerve pain    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   Sleep apnea    Past Surgical History:  Procedure Laterality Date   AMPUTATION Left 11/29/2022   Procedure: AMPUTATION RAY, PARTIAL 1ST RAY;  Surgeon: Rosetta Posner, DPM;  Location: [facility];  Service: Orthopedics/Podiatry;  Laterality: Left;   CYSTOSCOPY W/ RETROGRADES Bilateral 05/31/2019   Procedure: CYSTOSCOPY WITH RETROGRADE PYELOGRAM;  Surgeon: Riki Altes, MD;  Location: [facility];  Service: Urology;  Laterality: Bilateral;   LOWER EXTREMITY ANGIOGRAPHY Left 11/28/2022   Procedure: Lower Extremity Angiography;  Surgeon: Annice Needy, MD;  Location: [facility];  Service: Cardiovascular;  Laterality: Left;   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-C N/A 05/31/2019   Procedure: TRANSURETHRAL RESECTION OF BLADDER TUMOR WITH gemcitabine;  Surgeon: Riki Altes, MD;  Location: [facility];  Service: Urology;  Laterality: N/A;   Social History   Socioeconomic History   Marital status: Single    Spouse name: Not on file   Number of children: Not on file   Years of education: Not on file    Highest education level: Not on file  Occupational History   Not on file  Tobacco Use   Smoking status: Every Day    Current packs/day: 0.25    Average packs/day: 0.3 packs/day for 40.0 years (10.0 ttl pk-yrs)    Types: Cigarettes   Smokeless tobacco: Never  Vaping Use   Vaping status: Never Used  Substance and Sexual Activity   Alcohol use: Yes    Alcohol/week: 18.0 standard drinks of alcohol    Types: 12 Cans of beer, 6 Shots of liquor per week   Drug use: Not Currently    Types: Cocaine, Marijuana   Sexual activity: Yes  Other Topics Concern   Not on file  Social History Narrative   Not on file   Social Determinants of Health   Financial Resource Strain: Not on file  Food Insecurity: No Food Insecurity (12/17/2022)   Hunger Vital Sign    Worried About Running Out of Food in the Last Year: Never true    Ran Out of Food in the Last Year: Never true  Transportation Needs: No Transportation Needs (12/17/2022)   PRAPARE - Administrator, Civil Service (Medical): No    Lack of Transportation (Non-Medical): No  Physical Activity: Not on file  Stress: Not on file  Social Connections: Not on file   Family History  Problem Relation Age of Onset   Diabetes Mother    Heart disease Mother    Diabetes Father    Heart disease Father    Diabetes Brother    Allergies  Allergen Reactions  Nsaids Other (See Comments)    AKI AKI    Prior to Admission medications   Medication Sig Start Date End Date Taking? Authorizing Provider  ALPRAZolam Prudy Feeler) 1 MG tablet Take 1 mg by mouth 4 (four) times daily as needed. 08/27/19  Yes [provider]  aspirin EC 325 MG tablet Take 325 mg by mouth daily.   Yes [provider]  baclofen (LIORESAL) 10 MG tablet Take 10 mg by mouth 2 (two) times daily.  10/07/13  Yes [provider]  cholecalciferol (VITAMIN D) 400 UNITS TABS tablet Take 10,000 Units by mouth daily.    Yes [provider]   furosemide (LASIX) 20 MG tablet Take 20 mg by mouth daily. 01/15/21  Yes [provider]  JARDIANCE 25 MG TABS tablet Take 25 mg by mouth daily. 06/21/21  Yes [provider]  losartan (COZAAR) 100 MG tablet Take 100 mg by mouth daily.  09/25/17  Yes [provider]  naloxone (NARCAN) 0.4 MG/ML injection Inject as directed as directed   Yes [provider]  NOVOLOG FLEXPEN 100 UNIT/ML FlexPen Inject 40 Units into the skin 3 (three) times daily with meals. 11/29/22  Yes Marrion Coy, MD  Omega-3 Fatty Acids (FISH OIL) 1000 MG CAPS Take 1 capsule by mouth daily.  01/14/12  Yes [provider]  Oxycodone HCl 20 MG TABS Take 20 mg by mouth 4 (four) times daily as needed (pain).  06/20/15  Yes [provider]  sildenafil (REVATIO) 20 MG tablet TAKE 2 TO 5 TABLETS BY MOUTH 1 HOUR PRIOR TO INTERCOURSE 10/09/22  Yes Stoioff, Verna Czech, MD  simvastatin (ZOCOR) 40 MG tablet Take 1 tablet (40 mg total) by mouth daily. 11/29/22  Yes Marrion Coy, MD  Testosterone 20.25 MG/ACT (1.62%) GEL APPLY 4 APPLICATIONS TOPICALLY DAILY. 05/12/22  Yes Stoioff, Verna Czech, MD  TOUJEO MAX SOLOSTAR 300 UNIT/ML Solostar Pen Inject 80-120 Units into the skin in the morning and at bedtime. Inject 80 Units into the skin daily. 80 units in the am and 120 units at bedtime   Yes [provider]  vitamin C (ASCORBIC ACID) 250 MG tablet Take 250 mg by mouth daily.   Yes [provider]  albuterol (VENTOLIN HFA) 108 (90 Base) MCG/ACT inhaler Inhale 1 puff into the lungs every 6 (six) hours as needed for wheezing or shortness of breath.  Patient not taking: Reported on 12/17/2022 02/15/13   [provider]  AMBULATORY NON FORMULARY MEDICATION Trimix (30/1/10)-(Pap/Phent/PGE)  Dosage: Inject 0.3 cc and may increase 0.1cc to achieve and erection lasting no longer than 1 hour per injection   Vial 1ml  Qty #5 refills 2  [facility] [phone] Fax  [phone] 02/13/22   Riki Altes, MD  Nutritional Supplements (KETO PO) Take 1 capsule by mouth.    [provider]  TRULICITY 1.5 MG/0.5ML SOPN Inject 1.5 mg into the skin once a week. Patient not taking: Reported on 12/17/2022 01/16/21   [provider]   PERIPHERAL VASCULAR CATHETERIZATION  Result Date: 12/18/2022 See surgical note for result.  DG Foot Complete Left  Result Date: 12/17/2022 CLINICAL DATA:  Oozing first toe wound, initial encounter EXAM: LEFT FOOT - COMPLETE 3+ VIEW COMPARISON:  12/08/2022 FINDINGS: Changes consistent with prior first toe amputation are seen. Soft tissue irregularity is noted consistent with the soft tissue wound. No definitive erosive changes are noted at this time. IMPRESSION: Soft tissue wound without acute bony abnormality. Electronically Signed  By: Alcide Clever M.D.   On: 12/17/2022 19:50   DG Chest Port 1 View  Result Date: 12/17/2022 CLINICAL DATA:  Questionable sepsis EXAM: PORTABLE CHEST 1 VIEW COMPARISON:  Chest x-ray 01/28/2021 FINDINGS: The heart size and mediastinal contours are within normal limits. Both lungs are clear. The visualized skeletal structures are unremarkable. IMPRESSION: No active disease. Electronically Signed   By: Darliss Cheney M.D.   On: 12/17/2022 19:41    Positive ROS: All other systems have been reviewed and were otherwise negative with the exception of those mentioned in the HPI and as above.  12 point ROS was performed.  Physical Exam: General: Alert and oriented.  No apparent distress.  Vascular:  Left foot:Dorsalis Pedis:  absent Posterior Tibial:  absent  Right foot: Dorsalis Pedis:  absent Posterior Tibial:  absent  Neuro:absent protective sensation  Derm: Necrotic area along previous first ray amputation site.  Mild erythema just surrounding the amputation site.  No proximal lymphangitic streaking.  No purulence.  Wound is mostly dry with just mild amount of serosanguineous  drainage.  Ortho/MS: Status post left first ray amputation  Assessment: Gangrene left first ray status post amputation great toe Severe peripheral vascular disease  Plan: Patient underwent revascularization today.  Will need revision of the partial first ray amputation.  I discussed this today with the patient in detail.  I discussed revision of the first ray versus transmetatarsal amputation and we will try to save as much of the tissue as possible going forward.  Discussed the risks of possible further complications with nonhealing of the wound and further revision surgery.  Patient will have surgery tomorrow by Dr. Excell Seltzer.  I discussed with Dr. Excell Seltzer today.    Irean Hong, DPM Cell [phone]   12/18/2022 1:32 PM

## 2022-12-18 NOTE — Interval H&P Note (Signed)
History and Physical Interval Note:  12/18/2022 9:31 AM  Corey Foster  has presented today for surgery, with the diagnosis of PAD with ulceration.  The various methods of treatment have been discussed with the patient and family. After consideration of risks, benefits and other options for treatment, the patient has consented to  Procedure(s): Lower Extremity Angiography (Left) as a surgical intervention.  The patient's history has been reviewed, patient examined, no change in status, stable for surgery.  I have reviewed the patient's chart and labs.  Questions were answered to the patient's satisfaction.     [name]

## 2022-12-18 NOTE — Plan of Care (Signed)

## 2022-12-18 NOTE — Op Note (Signed)
[facility]  Percutaneous Study/Intervention Procedural Note   Date of Surgery: 12/18/2022  Surgeon(s):Jaylnn Ullery    Assistants:none  Pre-operative Diagnosis: PAD with ulceration and infection LLE  Post-operative diagnosis:  Same  Procedure(s) Performed:             1.  Ultrasound guidance for vascular access right femoral artery             2.  Catheter placement into left SFA from right femoral approach             3.  Aortogram and selective left lower extremity angiogram             4.  Percutaneous transluminal angioplasty of the left posterior tibial artery at the foot and ankle with a 2 mm diameter by 22 cm length angioplasty balloon             5.  Percutaneous transluminal angioplasty of the left peroneal artery and lateral tarsal artery with 1.5 mm diameter by 10 cm length angioplasty balloon  6.  StarClose closure device right femoral artery  EBL: 5 cc  Contrast: 70 cc  Fluoro Time: 8.9 minutes  Moderate Conscious Sedation Time: approximately 50 minutes using 2 mg of Versed and 50 mcg of Fentanyl              Indications:  Corey Foster is a 67 y.o.male with a nonhealing wound after digital amputation on the left foot.  He now has infection and signs of malperfusion.  The Corey Foster is brought in for angiography for further evaluation and potential treatment.  Due to the limb threatening nature of the situation, angiogram was performed for attempted limb salvage. The Corey Foster is aware that if the procedure fails, amputation would be expected.  The Corey Foster also understands that even with successful revascularization, amputation may still be required due to the severity of the situation.  Risks and benefits are discussed and informed consent is obtained.   Procedure:  The Corey Foster was identified and appropriate procedural time out was performed.  The Corey Foster was then placed supine on the table and prepped and draped in the usual sterile fashion. Moderate  conscious sedation was administered during a face to face encounter with the Corey Foster throughout the procedure with my supervision of the RN administering medicines and monitoring the Corey Foster's vital signs, pulse oximetry, telemetry and mental status throughout from the start of the procedure until the Corey Foster was taken to the recovery room. Ultrasound was used to evaluate the right common femoral artery.  It was patent .  A digital ultrasound image was acquired.  A Seldinger needle was used to access the right common femoral artery under direct ultrasound guidance and a permanent image was performed.  A 0.035 J wire was advanced without resistance and a 5Fr sheath was placed.  Pigtail catheter was placed into the aorta and an AP aortogram was performed. This demonstrated normal renal arteries and normal aorta and iliac segments without significant stenosis. I then crossed the aortic bifurcation and advanced to the left femoral head and into the left SFA. Selective left lower extremity angiogram was then performed. This demonstrated fairly normal common femoral artery, profunda femoris artery, superficial femoral and popliteal arteries.  There was a near true tibial trifurcation although the anterior tibial artery was extremely small and occluded in the proximal segment.  The peroneal artery was large and seemed to be the best runoff distally but this occluded distally just above the tarsal branches in  the lower leg with reconstitution of the lateral tarsal artery as the best runoff.  The posterior tibial artery had been treated proximally and this was now widely patent near the origin but the distal posterior tibial artery at the ankle had reoccluded after previous intervention.  There was reconstitution of a small posterior tibial artery in the foot but this was heavily diseased. It was felt that it was in the Corey Foster's best interest to proceed with intervention after these images to avoid a second procedure and a  larger amount of contrast and fluoroscopy based off of the findings from the initial angiogram. The Corey Foster was systemically heparinized and a 6 French 70 cm sheath was then placed over the Terumo Advantage wire. I then used a 135 cm Nava cross catheter and the 0.018 advantage wire to first get down into the posterior tibial artery where selective imaging the posterior tibial artery was done for best evaluation.  I was then able to cross the occlusion at the foot and ankle and get into the posterior tibial artery in the foot.  A 2 mm diameter by 22 cm length angioplasty balloon was then inflated to 12 atm for 1 minute.  Completion imaging following this showed very sluggish flow although the vessel was now patent with what appeared to be a less than 50% residual stenosis.  His blood pressure was low throughout the procedure and the distal perfusion was very poor.  I then turned my attention to the peroneal artery.  I was able to get into the peroneal artery with a Marshia Ly cross catheter and cross the occlusion of the distal peroneal artery getting the wire out distal branches of the lateral tarsal artery.  A 1.5 mm diameter by 10 cm length angioplasty balloon was then inflated to 10 atm for 1 minute.  Completion imaging showed less than 30% residual stenosis in this vessel that was now in line.  Attempts of getting across the long segment anterior tibial artery occlusion were also made but this was an incredibly atretic vessel and unable to be crossed for revascularization to the dorsalis pedis artery in the foot. I elected to terminate the procedure. The sheath was removed and StarClose closure device was deployed in the right femoral artery with excellent hemostatic result. The Corey Foster was taken to the recovery room in stable condition having tolerated the procedure well.  Findings:               Aortogram:  This demonstrated normal renal arteries and normal aorta and iliac segments without significant stenosis.              Left Lower Extremity:  This demonstrated fairly normal common femoral artery, profunda femoris artery, superficial femoral and popliteal arteries.  There was a near true tibial trifurcation although the anterior tibial artery was extremely small and occluded in the proximal segment.  The peroneal artery was large and seemed to be the best runoff distally but this occluded distally just above the tarsal branches in the lower leg with reconstitution of the lateral tarsal artery as the best runoff.  The posterior tibial artery had been treated proximally and this was now widely patent near the origin but the distal posterior tibial artery at the ankle had reoccluded after previous intervention.  There was reconstitution of a small posterior tibial artery in the foot but this was heavily diseased.    Disposition: Corey Foster was taken to the recovery room in stable condition having tolerated the procedure well.  Complications: None  Corey Foster 12/18/2022 11:06 AM   This note was created with Dragon Medical transcription system. Any errors in dictation are purely unintentional.

## 2022-12-18 NOTE — Progress Notes (Signed)
Dr. Wyn Quaker in at bedside to speak with pt. MD aware of BP: states to keep SBP greater than 90. To wean & DC Neo gtt. Here in SPR.

## 2022-12-18 NOTE — Plan of Care (Signed)
  Problem: Respiratory: Goal: Ability to maintain adequate ventilation will improve Outcome: Progressing   Problem: Education: Goal: Ability to describe self-care measures that may prevent or decrease complications (Diabetes Survival Skills Education) will improve Outcome: Progressing   Problem: Coping: Goal: Ability to adjust to condition or change in health will improve Outcome: Progressing   Problem: Fluid Volume: Goal: Ability to maintain a balanced intake and output will improve Outcome: Progressing   Problem: Nutritional: Goal: Maintenance of adequate nutrition will improve Outcome: Progressing   Problem: Education: Goal: Knowledge of General Education information will improve Description: Including pain rating scale, medication(s)/side effects and non-pharmacologic comfort measures Outcome: Progressing

## 2022-12-19 ENCOUNTER — Encounter
Admission: EM | Disposition: A | Discharge status: Home / Self Care | Payer: Self-pay | Source: Home / Self Care | Attending: Internal Medicine

## 2022-12-19 ENCOUNTER — Other Ambulatory Visit: Payer: Self-pay

## 2022-12-19 ENCOUNTER — Inpatient Hospital Stay: Payer: Medicare HMO | Admitting: Certified Registered Nurse Anesthetist

## 2022-12-19 ENCOUNTER — Inpatient Hospital Stay: Payer: Medicare HMO

## 2022-12-19 ENCOUNTER — Encounter: Payer: Self-pay | Admitting: Internal Medicine

## 2022-12-19 DIAGNOSIS — A419 Sepsis, unspecified organism: Secondary | ICD-10-CM | POA: Diagnosis not present

## 2022-12-19 DIAGNOSIS — I96 Gangrene, not elsewhere classified: Secondary | ICD-10-CM | POA: Diagnosis not present

## 2022-12-19 HISTORY — PX: AMPUTATION: SHX166

## 2022-12-19 LAB — BASIC METABOLIC PANEL
Anion gap: 7 (ref 5–15)
BUN: 20 mg/dL (ref 8–23)
CO2: 23 mmol/L (ref 22–32)
Calcium: 8.5 mg/dL — ABNORMAL LOW (ref 8.9–10.3)
Chloride: 109 mmol/L (ref 98–111)
Creatinine, Ser: 1.26 mg/dL — ABNORMAL HIGH (ref 0.61–1.24)
GFR, Estimated: >60 mL/min (ref >60)
Glucose, Bld: 49 mg/dL — ABNORMAL LOW (ref 70–99)
Potassium: 3.9 mmol/L (ref 3.5–5.1)
Sodium: 139 mmol/L (ref 135–145)

## 2022-12-19 LAB — CBC WITH DIFFERENTIAL/PLATELET
Abs Immature Granulocytes: 0.03 10*3/uL (ref 0.00–0.07)
Basophils Absolute: 0 10*3/uL (ref 0.0–0.1)
Basophils Relative: 0 %
Eosinophils Absolute: 0.1 10*3/uL (ref 0.0–0.5)
Eosinophils Relative: 1 %
HCT: 42.5 % (ref 39.0–52.0)
Hemoglobin: 14.7 g/dL (ref 13.0–17.0)
Immature Granulocytes: 0 %
Lymphocytes Relative: 24 %
Lymphs Abs: 1.7 10*3/uL (ref 0.7–4.0)
MCH: 31.9 pg (ref 26.0–34.0)
MCHC: 34.6 g/dL (ref 30.0–36.0)
MCV: 92.2 fL (ref 80.0–100.0)
Monocytes Absolute: 0.6 10*3/uL (ref 0.1–1.0)
Monocytes Relative: 9 %
Neutro Abs: 4.7 10*3/uL (ref 1.7–7.7)
Neutrophils Relative %: 66 %
Platelets: 224 10*3/uL (ref 150–400)
RBC: 4.61 MIL/uL (ref 4.22–5.81)
RDW: 13.6 % (ref 11.5–15.5)
WBC: 7.1 10*3/uL (ref 4.0–10.5)
nRBC: 0 % (ref 0.0–0.2)

## 2022-12-19 LAB — GLUCOSE, CAPILLARY
Glucose-Capillary: 54 mg/dL — ABNORMAL LOW (ref 70–99)
Glucose-Capillary: 117 mg/dL — ABNORMAL HIGH (ref 70–99)
Glucose-Capillary: 92 mg/dL (ref 70–99)
Glucose-Capillary: 84 mg/dL (ref 70–99)
Glucose-Capillary: 77 mg/dL (ref 70–99)
Glucose-Capillary: 136 mg/dL — ABNORMAL HIGH (ref 70–99)
Glucose-Capillary: 121 mg/dL — ABNORMAL HIGH (ref 70–99)

## 2022-12-19 SURGERY — AMPUTATION, FOOT, RAY
Anesthesia: General | Site: Foot | Laterality: Left

## 2022-12-19 MED ORDER — MIDAZOLAM HCL 2 MG/2ML IJ SOLN
INTRAMUSCULAR | Status: DC | PRN
  Administered 2022-12-19: 2 mg via INTRAVENOUS

## 2022-12-19 MED ORDER — OXYCODONE HCL 5 MG PO TABS: 5.0000 mg | ORAL_TABLET | Freq: Once | ORAL | Status: DC | PRN

## 2022-12-19 MED ORDER — BUPIVACAINE HCL (PF) 0.5 % IJ SOLN
INTRAMUSCULAR | Status: AC
  Filled 2022-12-19: qty 30

## 2022-12-19 MED ORDER — SODIUM CHLORIDE 0.9 % IV SOLN
2.0000 g | Freq: Three times a day (TID) | INTRAVENOUS | Status: DC
  Administered 2022-12-19 – 2022-12-20 (×4): 2 g via INTRAVENOUS
  Filled 2022-12-19 (×6): qty 12.5

## 2022-12-19 MED ORDER — FENTANYL CITRATE (PF) 100 MCG/2ML IJ SOLN
INTRAMUSCULAR | Status: DC | PRN
  Administered 2022-12-19: 50 ug via INTRAVENOUS

## 2022-12-19 MED ORDER — FENTANYL CITRATE (PF) 100 MCG/2ML IJ SOLN: 25.0000 ug | INTRAMUSCULAR | Status: DC | PRN

## 2022-12-19 MED ORDER — BUPIVACAINE HCL (PF) 0.5 % IJ SOLN
INTRAMUSCULAR | Status: DC | PRN
  Administered 2022-12-19: 20 mL

## 2022-12-19 MED ORDER — PROPOFOL 1000 MG/100ML IV EMUL
INTRAVENOUS | Status: AC
  Filled 2022-12-19: qty 100

## 2022-12-19 MED ORDER — DEXTROSE 50 % IV SOLN
25.0000 g | INTRAVENOUS | Status: AC
  Administered 2022-12-19: 25 g via INTRAVENOUS
  Filled 2022-12-19: qty 50

## 2022-12-19 MED ORDER — VANCOMYCIN HCL 1500 MG/300ML IV SOLN
1500.0000 mg | INTRAVENOUS | Status: DC
  Administered 2022-12-19: 1500 mg via INTRAVENOUS
  Filled 2022-12-19 (×2): qty 300

## 2022-12-19 MED ORDER — PROPOFOL 500 MG/50ML IV EMUL
INTRAVENOUS | Status: DC | PRN
  Administered 2022-12-19: 75 ug/kg/min via INTRAVENOUS

## 2022-12-19 MED ORDER — OXYCODONE HCL 5 MG/5ML PO SOLN: 5.0000 mg | Freq: Once | ORAL | Status: DC | PRN

## 2022-12-19 MED ORDER — 0.9 % SODIUM CHLORIDE (POUR BTL) OPTIME
TOPICAL | Status: DC | PRN
  Administered 2022-12-19: 1000 mL

## 2022-12-19 MED ORDER — SODIUM CHLORIDE 0.9 % IV SOLN: INTRAVENOUS | Status: DC | PRN

## 2022-12-19 MED ORDER — FENTANYL CITRATE (PF) 100 MCG/2ML IJ SOLN
INTRAMUSCULAR | Status: AC
  Filled 2022-12-19: qty 2

## 2022-12-19 MED ORDER — MIDAZOLAM HCL 2 MG/2ML IJ SOLN
INTRAMUSCULAR | Status: AC
  Filled 2022-12-19: qty 2

## 2022-12-19 SURGICAL SUPPLY — 65 items
BAG COUNTER SPONGE SURGICOUNT (BAG) IMPLANT
BAG SPNG CNTER NS LX DISP (BAG)
BLADE MED AGGRESSIVE (BLADE) IMPLANT
BLADE OSC/SAGITTAL MD 9X18.5 (BLADE) ×2 IMPLANT
BLADE OSCILLATING/SAGITTAL (BLADE)
BLADE SURG 15 STRL LF DISP TIS (BLADE) ×4 IMPLANT
BLADE SURG 15 STRL SS (BLADE) ×2
BLADE SW THK.38XMED LNG THN (BLADE) ×2 IMPLANT
BNDG CMPR 5X4 CHSV STRCH STRL (GAUZE/BANDAGES/DRESSINGS) ×1
BNDG CMPR 5X4 KNIT ELC UNQ LF (GAUZE/BANDAGES/DRESSINGS) ×1
BNDG CMPR 6 X 5 YARDS HK CLSR (GAUZE/BANDAGES/DRESSINGS) ×1
BNDG CMPR 75X41 PLY HI ABS (GAUZE/BANDAGES/DRESSINGS) ×1
BNDG COHESIVE 4X5 TAN STRL LF (GAUZE/BANDAGES/DRESSINGS) ×2 IMPLANT
BNDG ELASTIC 4INX 5YD STR LF (GAUZE/BANDAGES/DRESSINGS) ×2 IMPLANT
BNDG ELASTIC 6INX 5YD STR LF (GAUZE/BANDAGES/DRESSINGS) ×2 IMPLANT
BNDG ESMARCH 4 X 12 STRL LF (GAUZE/BANDAGES/DRESSINGS) ×1
BNDG ESMARCH 4X12 STRL LF (GAUZE/BANDAGES/DRESSINGS) ×2 IMPLANT
BNDG GAUZE DERMACEA FLUFF 4 (GAUZE/BANDAGES/DRESSINGS) ×4 IMPLANT
BNDG GZE DERMACEA 4 6PLY (GAUZE/BANDAGES/DRESSINGS) ×2
BNDG STRETCH 4X75 STRL LF (GAUZE/BANDAGES/DRESSINGS) ×2 IMPLANT
CUFF TOURN SGL QUICK 12 (TOURNIQUET CUFF) ×2 IMPLANT
CUFF TOURN SGL QUICK 18X4 (TOURNIQUET CUFF) ×2 IMPLANT
DRAIN PENROSE 12X.25 LTX STRL (MISCELLANEOUS) IMPLANT
DRAPE FLUOR MINI C-ARM 54X84 (DRAPES) ×2 IMPLANT
DRSG MEPILEX FLEX 3X3 (GAUZE/BANDAGES/DRESSINGS) IMPLANT
DURAPREP 26ML APPLICATOR (WOUND CARE) ×2 IMPLANT
ELECT REM PT RETURN 9FT ADLT (ELECTROSURGICAL) ×1
ELECTRODE REM PT RTRN 9FT ADLT (ELECTROSURGICAL) ×2 IMPLANT
GAUZE SPONGE 4X4 12PLY STRL (GAUZE/BANDAGES/DRESSINGS) ×2 IMPLANT
GAUZE XEROFORM 1X8 LF (GAUZE/BANDAGES/DRESSINGS) ×4 IMPLANT
GLOVE BIO SURGEON STRL SZ7 (GLOVE) ×2 IMPLANT
GLOVE INDICATOR 7.0 STRL GRN (GLOVE) ×2 IMPLANT
GLOVE INDICATOR 7.5 STRL GRN (GLOVE) ×2 IMPLANT
GOWN STRL REUS W/ TWL LRG LVL3 (GOWN DISPOSABLE) ×4 IMPLANT
GOWN STRL REUS W/TWL LRG LVL3 (GOWN DISPOSABLE) ×2
HANDLE YANKAUER SUCT BULB TIP (MISCELLANEOUS) IMPLANT
HANDPIECE VERSAJET DEBRIDEMENT (MISCELLANEOUS) IMPLANT
KIT TURNOVER KIT A (KITS) ×2 IMPLANT
MANIFOLD NEPTUNE II (INSTRUMENTS) ×2 IMPLANT
NDL HYPO 25X1 1.5 SAFETY (NEEDLE) ×4 IMPLANT
NDL SAFETY ECLIPSE 18X1.5 (NEEDLE) ×2 IMPLANT
NEEDLE HYPO 25X1 1.5 SAFETY (NEEDLE) ×2 IMPLANT
NS IRRIG 500ML POUR BTL (IV SOLUTION) ×2 IMPLANT
PACK EXTREMITY ARMC (MISCELLANEOUS) ×2 IMPLANT
PULSAVAC PLUS IRRIG FAN TIP (DISPOSABLE)
SOL PREP PVP 2OZ (MISCELLANEOUS) ×1
SOLUTION PREP PVP 2OZ (MISCELLANEOUS) ×2 IMPLANT
SPONGE T-LAP 18X18 ~~LOC~~+RFID (SPONGE) ×2 IMPLANT
STAPLER SKIN PROX 35W (STAPLE) ×2 IMPLANT
STOCKINETTE M/LG 89821 (MISCELLANEOUS) ×2 IMPLANT
STRIP CLOSURE SKIN 1/4X4 (GAUZE/BANDAGES/DRESSINGS) ×2 IMPLANT
SUT ETHILON 2 0 FS 18 (SUTURE) ×4 IMPLANT
SUT ETHILON 2 0 FSLX (SUTURE) ×2 IMPLANT
SUT ETHILON 3-0 FS-10 30 BLK (SUTURE) ×2
SUT VIC AB 2-0 CT1 27 (SUTURE)
SUT VIC AB 2-0 CT1 TAPERPNT 27 (SUTURE) ×2 IMPLANT
SUT VIC AB 2-0 SH 27 (SUTURE)
SUT VIC AB 2-0 SH 27XBRD (SUTURE) ×2 IMPLANT
SUT VIC AB 3-0 SH 27 (SUTURE) ×1
SUT VIC AB 3-0 SH 27X BRD (SUTURE) ×4 IMPLANT
SUTURE EHLN 3-0 FS-10 30 BLK (SUTURE) ×4 IMPLANT
SYR 10ML LL (SYRINGE) ×4 IMPLANT
TIP FAN IRRIG PULSAVAC PLUS (DISPOSABLE) IMPLANT
TRAP FLUID SMOKE EVACUATOR (MISCELLANEOUS) ×2 IMPLANT
WATER STERILE IRR 500ML POUR (IV SOLUTION) ×2 IMPLANT

## 2022-12-19 NOTE — Transfer of Care (Signed)
Immediate Anesthesia Transfer of Care Note  Patient: Corey Foster  Procedure(s) Performed: FIRST LEFT RAY AMPUTATION (Left: Foot)  Patient Location: PACU  Anesthesia Type:General  Level of Consciousness: awake, alert , and oriented  Airway & Oxygen Therapy: Patient Spontanous Breathing  Post-op Assessment: Report given to RN and Post -op Vital signs reviewed and stable  Post vital signs: Reviewed and stable  Last Vitals:  Vitals Value Taken Time  BP 109/74 12/19/22 1307  Temp 36.1 C 12/19/22 1305  Pulse 90 12/19/22 1310  Resp 14 12/19/22 1310  SpO2 98 % 12/19/22 1310  Vitals shown include unfiled device data.  Last Pain:  Vitals:   12/19/22 1305  TempSrc:   PainSc: 0-No pain         Complications: No notable events documented.

## 2022-12-19 NOTE — Progress Notes (Signed)
Progress Note   Patient: Corey Foster ZOX:[id number] DOB: 1955-05-01 DOA: 12/17/2022     2 DOS: the patient was seen and examined on 12/19/2022      Subjective:  Patient awaiting surgery on the left foot by podiatry today Denies nausea vomiting or abdominal pain   Brief hospital course:  Corey Foster is a 67 y.o. male with medical history significant of diabetes mellitus, degenerative joint disease, hypertension, hyperlipidemia, GERD, sleep apnea who follows up with podiatry Dr. Excell Seltzer.  Patient underwent a left first toe amputation about 2 to 3 weeks ago by podiatrist Dr. Excell Seltzer and around that time patient underwent angiogram by Dr. Wyn Quaker vascular surgeon.  Postoperatively patient remained stable was discharged and asked to follow-up with podiatrist as an outpatient.  Patient followed up with podiatry today and found to have infection on the left foot and therefore brought in by podiatrist from the clinic to the emergency room for admission for IV antibiotic therapy as well as vascular surgeons consultation.  Patient denies nausea vomiting abdominal pain chest pain or cough.  Upon arrival to the emergency room ED physician discussed the case with vascular surgeon who agreed to see patient in consultation. ED course: Vitals on arrival to the ED was temperature 98.5 pulse 94 respiratory rate 17 blood pressure 125/76 saturating 99% on room air.  Initially upon arrival patient's systolic blood pressure was 88.  Patient received IV fluid bolus in the emergency room Hospitalist was therefore contacted to admit patient for further management     Assessment and Plan:  Left diabetic foot infection Podiatry consulted as well as vascular surgery Continue broad-spectrum antibiotics Follow-up on culture results Plan of care discussed with podiatry recs as well as vascular surgery Foot x-rays showing findings of soft tissue wound without findings of acute bony involvement Awaiting left foot surgery by  podiatry   Diabetes mellitus type II with hyperglycemia Monitor glucose level closely Last A1c 11/27/2022 was greater than 15.5 Continue glucose monitoring Continue current insulin therapy   Hypertension Blood pressure soft on admission We will hold antihypertensives at this time Monitor blood pressure closely   Hyperlipidemia-continue statin therapy   GERD -Continue PPI therapy   Sleep apnea   VTE prophylaxis:-Heparin on hold pending surgery      Advance Care Planning:   Code Status: Prior full code   Consults: Podiatry, vascular surgery   Family Communication: No family present at bedside   Data Reviewed: Have reviewed dietary documentation, nursing documentation as well as below mentioned labs and vitals   Time spent: 45 minutes   Physical Exam:   General: Elderly male laying in bed in no acute distress CNS: Alert and oriented x 3 able to move all extremities CVS: S1-S2 present no murmur Abdomen: Abdomen obese nontender nondistended, full Musculoskeletal: Ulceration noted to the stump area on the left big toe amputation site HEENT: Atraumatic normocephalic     Latest Ref Rng & Units 12/19/2022    3:43 AM 12/18/2022    2:35 AM 12/17/2022    8:36 PM  CBC  WBC 4.0 - 10.5 K/uL 7.1  7.6  7.2   Hemoglobin 13.0 - 17.0 g/dL 81.1  91.4  78.2   Hematocrit 39.0 - 52.0 % 42.5  43.5  45.5   Platelets 150 - 400 K/uL 224  219  208        Latest Ref Rng & Units 12/19/2022    3:43 AM 12/18/2022    2:35 AM 12/17/2022  8:36 PM  BMP  Glucose 70 - 99 mg/dL 49  865    BUN 8 - 23 mg/dL 20  27    Creatinine 7.84 - 1.24 mg/dL 6.96  2.95  2.84   Sodium 135 - 145 mmol/L 139  137    Potassium 3.5 - 5.1 mmol/L 3.9  3.9    Chloride 98 - 111 mmol/L 109  106    CO2 22 - 32 mmol/L 23  22    Calcium 8.9 - 10.3 mg/dL 8.5  8.2      Vitals:   12/19/22 1326 12/19/22 1330 12/19/22 1405 12/19/22 1637  BP:  135/88 134/87 (!) 118/93  Pulse: 91 92 92 94  Resp: 16 18 16 17   Temp: (!)  97 F (36.1 C)  98.1 F (36.7 C) 98.9 F (37.2 C)  TempSrc:   Oral   SpO2: 97% 98% 98% 97%  Weight:      Height:         Author: Loyce Dys, MD 12/19/2022 5:44 PM  For on call review [email or web address].

## 2022-12-19 NOTE — Anesthesia Procedure Notes (Signed)
Date/Time: 12/19/2022 12:10 PM  Performed by: Stormy Fabian, CRNAPre-anesthesia Checklist: Patient identified, Emergency Drugs available, Suction available and Patient being monitored Patient Re-evaluated:Patient Re-evaluated prior to induction Oxygen Delivery Method: Simple face mask Induction Type: IV induction Dental Injury: Teeth and Oropharynx as per pre-operative assessment

## 2022-12-19 NOTE — Plan of Care (Signed)

## 2022-12-19 NOTE — H&P (Signed)
HISTORY AND PHYSICAL INTERVAL NOTE:  12/19/2022  11:52 AM  Corey Foster  has presented today for surgery, with the diagnosis of GANGRENE LEFT FOOT.  The various methods of treatment have been discussed with the patient.  No guarantees were given.  After consideration of risks, benefits and other options for treatment, the patient has consented to surgery.  I have reviewed the patients' chart and labs.    PROCEDURE: LEFT PARTIAL 1ST RAY AMPUTATION  A history and physical examination was performed in the hospital.  The patient was reexamined.  There have been no changes to this history and physical examination.  Rosetta Posner, DPM

## 2022-12-19 NOTE — Inpatient Diabetes Management (Signed)
Inpatient Diabetes Program Recommendations  AACE/ADA: New Consensus Statement on Inpatient Glycemic Control (2015)  Target Ranges:  Prepandial:   less than 140 mg/dL      Peak postprandial:   less than 180 mg/dL (1-2 hours)      Critically ill patients:  140 - 180 mg/dL    Latest Reference Range & Units 12/18/22 08:01 12/18/22 11:05 12/18/22 13:14 12/18/22 16:30 12/18/22 21:30 12/18/22 22:46  Glucose-Capillary 70 - 99 mg/dL 161 (H) 096 (H) 045 (H) 97 121 (H) 85  (H): Data is abnormally high  Latest Reference Range & Units 12/19/22 05:22 12/19/22 06:24 12/19/22 08:26  Glucose-Capillary 70 - 99 mg/dL 54 (L) 409 (H) 92  (L): Data is abnormally low (H): Data is abnormally high    Home DM Meds: Jardiance 25 mg daily        Novolog 40 units TID        Toujeo 80 units AM/ 120 units PM  Current Orders: Levemir 15 units at bedtime      Novolog Sensitive Correction Scale/ SSI (0-9 units) TID AC + HS     MD- Note pt Refused Levemir dose last PM  HYPO this AM (CBG 54 at 5am)  May consider reducing the Levemir to 8 units at bedtime    --Will follow patient during hospitalization--  Ambrose Finland RN, MSN, CDCES Diabetes Coordinator Inpatient Glycemic Control Team Team Pager: [phone] (8a-5p)

## 2022-12-19 NOTE — Op Note (Signed)
PODIATRY / FOOT AND ANKLE SURGERY OPERATIVE REPORT    SURGEON: Rosetta Posner, DPM  PRE-OPERATIVE DIAGNOSIS:  1.  Left gangrene forefoot  POST-OPERATIVE DIAGNOSIS: Same  PROCEDURE(S): Left partial first ray amputation  HEMOSTASIS: No tourniquet  ANESTHESIA: MAC  ESTIMATED BLOOD LOSS: 20 cc  FINDING(S): 1.  Exposed left first metatarsal head with some dusky changes likely consistent with osteomyelitis 2.  Bone appeared to be hard at the proximal area of the amputation site at the first metatarsal. 3.  Overall did not have robust bleeding and appeared to be fairly minimal.  PATHOLOGY/SPECIMEN(S): Left first metatarsal, proximal margin purple ink  INDICATIONS:   Corey Foster is a 67 y.o. male who presents with a nonhealing left hallux amputation site with further gangrenous changes.  Patient was sent to the emergency room after being seen in clinic with worsening changes.  He had revascularization procedure attempted again but was told that he really did not have much circulation past his ankle.  All treatment options were discussed with the patient both conservative and surgical attempts at correction include potential risks and complications.  At this time patient is elected for surgical procedure consisting of left partial first ray amputation.  Discussed with patient that ultimately if this fails could consider transmetatarsal amputation.  If both procedures fail then patient will likely end up with below-knee amputation.  Patient is agreeable.  Consent obtained prior to procedure.  No guarantees given..  DESCRIPTION: After obtaining full informed written consent, the patient was brought back to the operating room and placed supine upon the operating table.  The patient received IV antibiotics prior to induction.  After obtaining adequate anesthesia, the patient was prepped and draped in the standard fashion.  A Mayo type block was performed with 20 cc of half percent Marcaine  plain.  Attention was then directed to the distal amputation site of the hallux.  A racquet type of incision was made around the gangrenous area removing any gangrenous tissues and then the arm of the racquet was extended up the dorsal medial aspect of the first metatarsal.  The incision was made straight to bone.  The gangrenous tissue was ellipsed and passed off the operative site.  The first metatarsal head was exposed to the proximal base area of the first metatarsal.  After circumferential dissection was continued around the first metatarsal sagittal bone saw was then used to create an osteotomy through the first metatarsal base/proximal shaft with the appropriate beveling.  The first metatarsal was then disarticulated and passed off the operative side along with the sesamoids.  The proximal margin of the first metatarsal was marked in purple ink and sent off to pathology along with the sesamoids.  The surgical site was flushed with copious amounts normal sterile saline.  Any tendinous type and debris was cut as far proximally as possible.  There are overall appear to be minimal bleeding that occurred with about 20 to 30 cc of blood loss throughout the whole procedure.  The deep fascia/deep structures reapproximated well coapted with 3-0 Vicryl along with the subcutaneous tissue.  The skin was then reapproximated well coapted with 3-0 nylon in a combination of simple and horizontal mattress type stitching.  It did appear to have somewhat tight closure near the second toe due to the amount of skin loss.  Closure was able to be obtained though although it was rather tight.  Postoperative dressing was then applied consisting of Xeroform followed by 4 x 4 gauze, ABD, Kerlix,  Ace wrap.  The patient tolerated the procedure and anesthesia well and was transferred to recovery room with vital signs stable vascular status appearing to be diminished down to the left foot, unchanged compared to preop.  Patient will be  discharged back into the inpatient room with the appropriate orders, instructions, and medications.  COMPLICATIONS: None  CONDITION: Good, stable  Rosetta Posner, DPM

## 2022-12-19 NOTE — Progress Notes (Addendum)
Pharmacy Antibiotic Note  Corey Foster is a 67 y.o. male presenting from outpatient podiatry clinic on 12/17/2022 with left foot infection. PMH significant for T2DM, CKD III, HTN, and HLD. Patient underwent lower extremity angiogram on 11/28/22 where he was diagnosed with PAD and gangrene to left great toe. In ED, patient experiencing swelling and drainage at site of left great toe amputation (11/29/22). Patient remains afebrile with no leukocytosis. Pharmacy has been consulted for vancomycin and cefepime dosing.  Plan: Increase to vancomycin 1500 mg IV every 24 hours (eAUC 485.9, Cmin 10.6, IBW, Scr 1.26, Vd 0.5 L/kg) Increase to cefepime 2 g IV every 8 hours Monitor renal function, clinical status, culture data, and LOT F/u imaging of foot and podiatry/vascular consults  Height: 5\' 11"  (180.3 cm) Weight: 112.9 kg (248 lb 14.4 oz) IBW/kg (Calculated) : 75.3  Temp (24hrs), Avg:98.1 F (36.7 C), Min:97.7 F (36.5 C), Max:98.6 F (37 C)  Recent Labs  Lab 12/17/22 1622 12/17/22 2036 12/18/22 0235 12/19/22 0343  WBC 6.6 7.2 7.6 7.1  CREATININE 1.61* 1.72* 1.56* 1.26*  LATICACIDVEN 1.6 2.2* 1.1  --     Estimated Creatinine Clearance: 72.7 mL/min (A) (by C-G formula based on SCr of 1.26 mg/dL (H)).    Allergies  Allergen Reactions   Nsaids Other (See Comments)    AKI AKI    Antimicrobials this admission: Zosyn 10/23 x1 Cefepime 10/23 >>  Vancomycin 10/23 >>  Dose adjustments this admission:  Vancomycin 1250 mg IV q24h--> vancomycin 1500 mg IV q24h (10/25) Cefepime 2 g IV q12h--> cefepime 2 g IV q8h (10/25)  Microbiology results: 10/23 BCx: NGTD  Thank you for involving pharmacy in this patient's care.   Paulita Fujita, PharmD Clinical Pharmacist 12/19/2022 8:18 AM

## 2022-12-19 NOTE — TOC CM/SW Note (Signed)
Transition of Care [facility]) - Inpatient Brief Assessment   Patient Details  Name: Corey Foster MRN: [id number] Date of Birth: 1955-06-21  Transition of Care [facility]) CM/SW Contact:    Margarito Liner, LCSW Phone Number: 12/19/2022, 9:42 AM   Clinical Narrative: CSW reviewed chart. No TOC needs identified at this time. Plan for 1st left ray amputation today. Will continue to follow for potential post-acute needs.  Transition of Care Asessment: Insurance and Status: Insurance coverage has been reviewed Patient has primary care physician: Yes Home environment has been reviewed: Single family home Prior level of function:: Not documented Prior/Current Home Services: No current home services Social Determinants of Health Reivew: SDOH reviewed no interventions necessary Readmission risk has been reviewed: Yes Transition of care needs: no transition of care needs at this time

## 2022-12-19 NOTE — Anesthesia Preprocedure Evaluation (Signed)
Anesthesia Evaluation  Patient identified by MRN, date of birth, ID band Patient awake    Reviewed: Allergy & Precautions, NPO status , Patient's Chart, lab work & pertinent test results  History of Anesthesia Complications Negative for: history of anesthetic complications  Airway Mallampati: III  TM Distance: >3 FB Neck ROM: Full    Dental  (+) Partial Upper, Poor Dentition, Missing   Pulmonary sleep apnea and Continuous Positive Airway Pressure Ventilation , neg COPD, Current Smoker and Patient abstained from smoking.   Pulmonary exam normal breath sounds clear to auscultation       Cardiovascular Exercise Tolerance: Good METShypertension, Pt. on medications (-) CAD and (-) Past MI (-) dysrhythmias  Rhythm:Regular Rate:Normal - Systolic murmurs    Neuro/Psych  PSYCHIATRIC DISORDERS Anxiety     negative neurological ROS     GI/Hepatic ,GERD  Medicated and Controlled,,(+)     (-) substance abuse    Endo/Other  diabetes, Insulin Dependent  Last GLP1 agonist dose was 7 days ago. Denies GI symptoms today  Renal/GU CRFRenal disease     Musculoskeletal  (+) Arthritis ,    Abdominal  (+) + obese  Peds  Hematology   Anesthesia Other Findings Past Medical History: No date: Arthritis No date: Diabetes mellitus without complication (HCC) No date: DJD (degenerative joint disease) No date: GERD (gastroesophageal reflux disease) No date: Hyperlipidemia No date: Hypertension No date: Sciatic nerve pain 06/30/2014: Secondary erythrocytosis No date: Sleep apnea  Reproductive/Obstetrics                             Anesthesia Physical Anesthesia Plan  ASA: 3  Anesthesia Plan: General   Post-op Pain Management: Ofirmev IV (intra-op)*   Induction: Intravenous  PONV Risk Score and Plan: 1 and Propofol infusion, TIVA, Ondansetron and Dexamethasone  Airway Management Planned: Nasal  Cannula  Additional Equipment: None  Intra-op Plan:   Post-operative Plan:   Informed Consent: I have reviewed the patients History and Physical, chart, labs and discussed the procedure including the risks, benefits and alternatives for the proposed anesthesia with the patient or authorized representative who has indicated his/her understanding and acceptance.     Dental advisory given  Plan Discussed with: CRNA and Surgeon  Anesthesia Plan Comments: (Discussed risks of anesthesia with patient, including possibility of difficulty with spontaneous ventilation under anesthesia necessitating airway intervention, PONV, and rare risks such as cardiac or respiratory or neurological events, and allergic reactions. Discussed the role of CRNA in patient's perioperative care. Patient understands.)       Anesthesia Quick Evaluation

## 2022-12-19 NOTE — Progress Notes (Signed)
Noticed Lab Glucose of 49, went to Room to check CBG with Glucometer, shows CBG at 54, started Hypoglycemic protocol, about to give D50 70m on the Left arm, patient complained of pain, stopped infusion immediately and no D50 administered. Switch to another IV site on the Right arm and successfully administered D50 25mg  without complaints of pain from the patient.  Removed PIV on the left arm and Pt stated it was hurting him after infusing normal saline. Removed the IV and assessed the area. Skin looked normal and felt cool to the touch. Reached out to on call provider for next steps due to patient stating arm felt numb and he could not describe the pain. Marked the area and measured circumference of both arms. Circumference of both arms measured about 32 cm. Swollen area around IV site measured 9 cm. Pharmacy consulted for IV site pain and swelling. Pharmacy advised elevate and ice compress, done. Hospitalist in the room with Charge Nurse. CBG Repeated it was 117, Hospitalist aware and still on the phone with Daughter. Daughter updated by Charge Nurse for what happened and was able to talk to the hospitalist as well. Mychart set up by Charge so daughter can access chart as authorized by the patient. Charge deescalated situation and spoke with daughter. Daughter understanding of the situation. Monitoring continued. Call light within reach. Personal belongings within easy reach and no other needs at the moment.

## 2022-12-20 DIAGNOSIS — I96 Gangrene, not elsewhere classified: Secondary | ICD-10-CM | POA: Diagnosis not present

## 2022-12-20 DIAGNOSIS — A419 Sepsis, unspecified organism: Secondary | ICD-10-CM | POA: Diagnosis not present

## 2022-12-20 LAB — CBC WITH DIFFERENTIAL/PLATELET
Abs Immature Granulocytes: 0.02 10*3/uL (ref 0.00–0.07)
Basophils Absolute: 0 10*3/uL (ref 0.0–0.1)
Basophils Relative: 0 %
Eosinophils Absolute: 0.1 10*3/uL (ref 0.0–0.5)
Eosinophils Relative: 1 %
HCT: 43.7 % (ref 39.0–52.0)
Hemoglobin: 14.9 g/dL (ref 13.0–17.0)
Immature Granulocytes: 0 %
Lymphocytes Relative: 10 %
Lymphs Abs: 0.7 10*3/uL (ref 0.7–4.0)
MCH: 31.8 pg (ref 26.0–34.0)
MCHC: 34.1 g/dL (ref 30.0–36.0)
MCV: 93.2 fL (ref 80.0–100.0)
Monocytes Absolute: 0.5 10*3/uL (ref 0.1–1.0)
Monocytes Relative: 7 %
Neutro Abs: 5.3 10*3/uL (ref 1.7–7.7)
Neutrophils Relative %: 82 %
Platelets: 199 10*3/uL (ref 150–400)
RBC: 4.69 MIL/uL (ref 4.22–5.81)
RDW: 13.7 % (ref 11.5–15.5)
WBC: 6.5 10*3/uL (ref 4.0–10.5)
nRBC: 0 % (ref 0.0–0.2)

## 2022-12-20 LAB — BASIC METABOLIC PANEL
Anion gap: 6 (ref 5–15)
BUN: 13 mg/dL (ref 8–23)
CO2: 21 mmol/L — ABNORMAL LOW (ref 22–32)
Calcium: 8.4 mg/dL — ABNORMAL LOW (ref 8.9–10.3)
Chloride: 109 mmol/L (ref 98–111)
Creatinine, Ser: 0.99 mg/dL (ref 0.61–1.24)
GFR, Estimated: >60 mL/min (ref >60)
Glucose, Bld: 85 mg/dL (ref 70–99)
Potassium: 4.4 mmol/L (ref 3.5–5.1)
Sodium: 136 mmol/L (ref 135–145)

## 2022-12-20 LAB — GLUCOSE, CAPILLARY: Glucose-Capillary: 120 mg/dL — ABNORMAL HIGH (ref 70–99)

## 2022-12-20 MED ORDER — AMOXICILLIN-POT CLAVULANATE 875-125 MG PO TABS: 1.0000 | ORAL_TABLET | Freq: Two times a day (BID) | ORAL | 0 refills | Status: DC

## 2022-12-20 MED ORDER — CLOPIDOGREL BISULFATE 75 MG PO TABS
75.0000 mg | ORAL_TABLET | Freq: Every day | ORAL | Status: DC
  Administered 2022-12-20: 75 mg via ORAL
  Filled 2022-12-20: qty 1

## 2022-12-20 MED ORDER — AMOXICILLIN-POT CLAVULANATE 875-125 MG PO TABS
1.0000 | ORAL_TABLET | Freq: Two times a day (BID) | ORAL | Status: DC
  Administered 2022-12-20: 1 via ORAL
  Filled 2022-12-20: qty 1

## 2022-12-20 MED ORDER — HEPARIN SODIUM (PORCINE) 5000 UNIT/ML IJ SOLN
5000.0000 [IU] | Freq: Three times a day (TID) | INTRAMUSCULAR | Status: DC
  Administered 2022-12-20: 5000 [IU] via SUBCUTANEOUS
  Filled 2022-12-20: qty 1

## 2022-12-20 NOTE — Evaluation (Signed)
Physical Therapy Evaluation Patient Details Name: Corey Foster MRN: [id number] DOB: 24-Mar-1955 Today's Date: 12/20/2022  History of Present Illness  Pt is 67 y.o. male s/p L great toe amputation.  Pt has PMH including: arthritis, DM, DJD, GERD, HTN, sciatic nerve pain, sleep apnea, and current smoker.   Clinical Impression  Pt received in Semi-Fowler's position and agreeable to therapy.  Pt able to verbalize understanding of weightbearing precautions and notes he is feeling ready for transfer training.  Pt able to come upright with use of bed features and then perform transfer over to the chair while abiding by weight bearing restrictions and with use of AD.  Pt does good job of placing weight in the L heel rather than the forefoot and toes and is able to safely mobilize at this time.  Pt has adequate strength to be able to return home with assistance from children.  Pt left in chair with all needs met and all bell within reach.           If plan is discharge home, recommend the following:     Can travel by private vehicle        Equipment Recommendations None recommended by PT  Recommendations for Other Services       Functional Status Assessment Patient has had a recent decline in their functional status and demonstrates the ability to make significant improvements in function in a reasonable and predictable amount of time.     Precautions / Restrictions Restrictions Weight Bearing Restrictions: Yes LLE Weight Bearing: Partial weight bearing LLE Partial Weight Bearing Percentage or Pounds: short distance with heel contact only.      Mobility  Bed Mobility Overal bed mobility: Modified Independent             General bed mobility comments: extra time, but able to do modI.    Transfers Overall transfer level: Modified independent Equipment used: Rolling walker (2 wheels)                    Ambulation/Gait Ambulation/Gait assistance: Modified independent  (Device/Increase time) Gait Distance (Feet): 8 Feet Assistive device: Rolling walker (2 wheels) Gait Pattern/deviations: Step-to pattern, Decreased step length - right, Decreased stance time - left Gait velocity: decreased     General Gait Details: Pt able to perform ambulation with while obiding weightbearing precautions.  Stairs            Wheelchair Mobility     Tilt Bed    Modified Rankin (Stroke Patients Only)       Balance Overall balance assessment: Modified Independent                                           Pertinent Vitals/Pain Pain Assessment Pain Assessment: 0-10 Pain Score: 8  Pain Location: Low back Pain Descriptors / Indicators: Stabbing, Pins and needles Pain Intervention(s): Limited activity within patient's tolerance, Monitored during session, Premedicated before session    Home Living Family/patient expects to be discharged to:: Private residence Living Arrangements: Alone Available Help at Discharge: Family;Personal care attendant;Available PRN/intermittently Type of Home: Apartment Home Access: Level entry       Home Layout: One level Home Equipment: Agricultural consultant (2 wheels)      Prior Function Prior Level of Function : Needs assist  Extremity/Trunk Assessment   Upper Extremity Assessment Upper Extremity Assessment: Overall WFL for tasks assessed    Lower Extremity Assessment Lower Extremity Assessment: Overall WFL for tasks assessed       Communication   Communication Communication: No apparent difficulties  Cognition Arousal: Alert                                              General Comments      Exercises Total Joint Exercises Ankle Circles/Pumps: AROM, Both, 10 reps, Seated   Assessment/Plan    PT Assessment Patient does not need any further PT services  PT Problem List         PT Treatment Interventions DME instruction;Gait  training;Therapeutic activities;Therapeutic exercise;Balance training;Neuromuscular re-education    PT Goals (Current goals can be found in the Care Plan section)  Acute Rehab PT Goals Patient Stated Goal: to get back home. PT Goal Formulation: With patient Time For Goal Achievement: 01/03/23 Potential to Achieve Goals: Good    Frequency       Co-evaluation               AM-PAC PT "6 Clicks" Mobility  Outcome Measure Help needed turning from your back to your side while in a flat bed without using bedrails?: None Help needed moving from lying on your back to sitting on the side of a flat bed without using bedrails?: None Help needed moving to and from a bed to a chair (including a wheelchair)?: None Help needed standing up from a chair using your arms (e.g., wheelchair or bedside chair)?: None Help needed to walk in hospital room?: None Help needed climbing 3-5 steps with a railing? : A Little 6 Click Score: 23    End of Session   Activity Tolerance: Patient tolerated treatment well Patient left: in chair;with call bell/phone within reach;with chair alarm set Nurse Communication: Mobility status PT Visit Diagnosis: Unsteadiness on feet (R26.81);Muscle weakness (generalized) (M62.81)    Time: 5009-3818 PT Time Calculation (min) (ACUTE ONLY): 19 min   Charges:   PT Evaluation $PT Eval Low Complexity: 1 Low   PT General Charges $$ ACUTE PT VISIT: 1 Visit         Nolon Bussing, PT, DPT Physical Therapist - [facility]  12/20/22, 11:35 AM

## 2022-12-20 NOTE — TOC Transition Note (Addendum)
Transition of Care [facility]) - CM/SW Discharge Note   Patient Details  Name: Corey Foster MRN: [id number] Date of Birth: June 18, 1955  Transition of Care [facility]) CM/SW Contact:  Bing Quarry, RN Phone Number: 12/20/2022, 10:41 AM   Clinical Narrative:  1026/24 Presented to [facility] ED 10/23. Pt states sent from Dr. Odessa Fleming office d/t possible vascular or infection to L great toe. Denied fever or chills in triage notes. 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 Post-op shoe in place per podiatry provider note.   Patient has a PCP, Aycock, Ngwe A, MD, listed and patient is insured.  Please call to arrange a post discharge from hospital follow up with PCP as soon as possible.  Patient will be sent  home on oral antibiotic amoxicillin-clavulanate (AUGMENTIN). Instructions included to on 5 things to know about antibiotics.  Follow up appointment to be made asap, if possible, with Rosetta Posner for visit on 12/26/2022 for wound recheck. Dr Bernette Redbird office [phone].  Post-operative instructions from provider in AVS, and when to all the doctor instructions included.  Call [facility] [phone]) if any of the following problems occur: You develop a temperature or fever. The bandage becomes saturated with blood. Medication does not stop your pain. Injury of the foot occurs. Any symptoms of infection including redness, odor, or red streaks running from wound.  Gabriel Cirri MSN RN CM  Care Management Department.  [place]  [facility] Direct Dial: [phone] Main Office Phone: [phone] Weekends Only    Contact:  Hill,Dominique (Son) [phone] [facility] [place])    Final next level of care: Home/Self Care Barriers to Discharge: No Barriers Identified   Patient Goals and CMS Choice      Discharge Placement                         Discharge Plan and Services Additional resources added to the After Visit Summary for                  DME  Arranged: N/A DME Agency: NA       HH Arranged: NA HH Agency: NA        Social Determinants of Health (SDOH) Interventions SDOH Screenings   Food Insecurity: No Food Insecurity (12/17/2022)  Housing: Low Risk  (12/17/2022)  Transportation Needs: No Transportation Needs (12/17/2022)  Utilities: Not At Risk (12/17/2022)  Tobacco Use: High Risk (12/19/2022)     Readmission Risk Interventions     No data to display

## 2022-12-20 NOTE — Plan of Care (Signed)

## 2022-12-20 NOTE — Discharge Summary (Signed)
Physician Discharge Summary   Patient: Corey Foster MRN: [id number] DOB: 07-13-1955  Admit date:     12/17/2022  Discharge date: 12/20/22  Discharge Physician: Loyce Dys   PCP: Emogene Morgan, MD   Recommendations at discharge:  Follow-up with podiatry  Discharge Diagnoses:  Left diabetic foot infection Diabetes mellitus type II with hyperglycemia Hypertension Hyperlipidemia GERD Sleep apnea  Hospital Course: Corey Foster is a 67 y.o. male with medical history significant of diabetes mellitus, degenerative joint disease, hypertension, hyperlipidemia, GERD, sleep apnea who follows up with podiatry Dr. Excell Seltzer.  Patient underwent a left first toe amputation about 2 to 3 weeks ago by podiatrist Dr. Excell Seltzer and around that time patient underwent angiogram by Dr. Wyn Quaker vascular surgeon.  Postoperatively patient remained stable was discharged and asked to follow-up with podiatrist as an outpatient.  Patient followed up with podiatry today and found to have infection on the left foot and therefore brought in by podiatrist from the clinic to the emergency room for admission for IV antibiotic therapy as well as vascular surgeons consultation.  Patient denies nausea vomiting abdominal pain chest pain or cough.  Upon arrival to the emergency room ED physician discussed the case with vascular surgeon who agreed to see patient in consultation. ED course: Vitals on arrival to the ED was temperature 98.5 pulse 94 respiratory rate 17 blood pressure 125/76 saturating 99% on room air.  Initially upon arrival patient's systolic blood pressure was 88.  Patient received IV fluid bolus in the emergency room Hospitalist was therefore contacted to admit patient for further management Patient received IV antibiotics and was seen by vascular surgeon and underwent angiogram and subsequently seen by podiatry taken to the OR for left partial first ray amputation.  Patient has been cleared for discharge by podiatry on 7  days course of oral Augmentin and to follow-up as an outpatient.  Detailed instructions given to patient by podiatry.  I also discussed this with patient's daughter who is a physician.    Consultants: Vascular, podiatry Procedures performed: As mentioned above Disposition: Home Diet recommendation:  Cardiac diet DISCHARGE MEDICATION: Allergies as of 12/20/2022       Reactions   Nsaids Other (See Comments)   AKI AKI        Medication List     STOP taking these medications    losartan 100 MG tablet Commonly known as: COZAAR       TAKE these medications    albuterol 108 (90 Base) MCG/ACT inhaler Commonly known as: VENTOLIN HFA Inhale 1 puff into the lungs every 6 (six) hours as needed for wheezing or shortness of breath.   ALPRAZolam 1 MG tablet Commonly known as: XANAX Take 1 mg by mouth 4 (four) times daily as needed.   AMBULATORY NON FORMULARY MEDICATION Trimix (30/1/10)-(Pap/Phent/PGE)  Dosage: Inject 0.3 cc and may increase 0.1cc to achieve and erection lasting no longer than 1 hour per injection   Vial 1ml  Qty #5 refills 2  [facility] [phone] Fax [phone]   amoxicillin-clavulanate 875-125 MG tablet Commonly known as: AUGMENTIN Take 1 tablet by mouth 2 (two) times daily.   aspirin EC 325 MG tablet Take 325 mg by mouth daily.   baclofen 10 MG tablet Commonly known as: LIORESAL Take 10 mg by mouth 2 (two) times daily.   cholecalciferol 10 MCG (400 UNIT) Tabs tablet Commonly known as: VITAMIN D3 Take 10,000 Units by mouth daily.   Fish Oil 1000 MG Caps Take 1  capsule by mouth daily.   furosemide 20 MG tablet Commonly known as: LASIX Take 20 mg by mouth daily.   Jardiance 25 MG Tabs tablet Generic drug: empagliflozin Take 25 mg by mouth daily.   KETO PO Take 1 capsule by mouth.   naloxone 0.4 MG/ML injection Commonly known as: NARCAN Inject as directed as directed   NovoLOG FlexPen 100 UNIT/ML FlexPen Generic  drug: insulin aspart Inject 40 Units into the skin 3 (three) times daily with meals.   Oxycodone HCl 20 MG Tabs Take 20 mg by mouth 4 (four) times daily as needed (pain).   sildenafil 20 MG tablet Commonly known as: REVATIO TAKE 2 TO 5 TABLETS BY MOUTH 1 HOUR PRIOR TO INTERCOURSE   simvastatin 40 MG tablet Commonly known as: ZOCOR Take 1 tablet (40 mg total) by mouth daily.   Testosterone 20.25 MG/ACT (1.62%) Gel APPLY 4 APPLICATIONS TOPICALLY DAILY.   Toujeo Max SoloStar 300 UNIT/ML Solostar Pen Generic drug: insulin glargine (2 Unit Dial) Inject 80-120 Units into the skin in the morning and at bedtime. Inject 80 Units into the skin daily. 80 units in the am and 120 units at bedtime   Trulicity 1.5 MG/0.5ML Soaj Generic drug: Dulaglutide Inject 1.5 mg into the skin once a week.   vitamin C 250 MG tablet Commonly known as: ASCORBIC ACID Take 250 mg by mouth daily.        Follow-up Information     Rosetta Posner, DPM. Schedule an appointment as soon as possible for a visit on 12/26/2022.   Specialty: Podiatry Why: For wound re-check Contact information: [address] [place] [address] [phone]                Discharge Exam: Ceasar Mons Weights   12/17/22 1618 12/18/22 0823 12/19/22 1148  Weight: 112.9 kg 112.9 kg 112.9 kg   General: Elderly male laying in bed in no acute distress CNS: Alert and oriented x 3 able to move all extremities CVS: S1-S2 present no murmur Abdomen: Abdomen obese nontender nondistended, full Musculoskeletal: Dressed in surgical boots HEENT: Atraumatic normocephalic  Condition at discharge: good  The results of significant diagnostics from this hospitalization (including imaging, microbiology, ancillary and laboratory) are listed below for reference.   Imaging Studies: DG Foot Complete Left  Result Date: 12/19/2022 CLINICAL DATA:  Postoperative, amputation, wound check EXAM: LEFT FOOT - COMPLETE 3+ VIEW COMPARISON:   12/17/2022 FINDINGS: Interval amputation of the first digit to the proximal metaphysis of the first metatarsal. Overall expected postoperative appearance with overlying bandaging, and a small amount of residual postoperative gas along the soft tissues. No bony destructive findings characteristic of osteomyelitis noted. Mild dorsal soft tissue swelling along the forefoot. Atheromatous vascular calcifications. IMPRESSION: 1. Interval amputation of the first digit to the proximal metaphysis of the first metatarsal. Expected postoperative appearance. 2. Mild dorsal soft tissue swelling along the forefoot. 3. Atheromatous vascular calcifications. Electronically Signed   By: Gaylyn Rong M.D.   On: 12/19/2022 17:44   DG MINI C-ARM IMAGE ONLY  Result Date: 12/19/2022 There is no interpretation for this exam.  This order is for images obtained during a surgical procedure.  Please See "Surgeries" Tab for more information regarding the procedure.   PERIPHERAL VASCULAR CATHETERIZATION  Result Date: 12/18/2022 See surgical note for result.  DG Foot Complete Left  Result Date: 12/17/2022 CLINICAL DATA:  Oozing first toe wound, initial encounter EXAM: LEFT FOOT - COMPLETE 3+ VIEW COMPARISON:  12/08/2022 FINDINGS: Changes consistent with  prior first toe amputation are seen. Soft tissue irregularity is noted consistent with the soft tissue wound. No definitive erosive changes are noted at this time. IMPRESSION: Soft tissue wound without acute bony abnormality. Electronically Signed   By: Alcide Clever M.D.   On: 12/17/2022 19:50   DG Chest Port 1 View  Result Date: 12/17/2022 CLINICAL DATA:  Questionable sepsis EXAM: PORTABLE CHEST 1 VIEW COMPARISON:  Chest x-ray 01/28/2021 FINDINGS: The heart size and mediastinal contours are within normal limits. Both lungs are clear. The visualized skeletal structures are unremarkable. IMPRESSION: No active disease. Electronically Signed   By: Darliss Cheney M.D.   On:  12/17/2022 19:41   DG Foot Complete Left  Result Date: 12/08/2022 CLINICAL DATA:  Recent great toe amputation infection EXAM: LEFT FOOT - COMPLETE 3+ VIEW COMPARISON:  04/27/2022 FINDINGS: Interval amputation of the first digit at the level of the MTP joint. Vascular calcifications. No osseous erosive or destructive change. No soft tissue emphysema. IMPRESSION: Interval amputation of the first digit at the level of the MTP joint. No definite acute osseous abnormality Electronically Signed   By: Jasmine Pang M.D.   On: 12/08/2022 18:52   PERIPHERAL VASCULAR CATHETERIZATION  Result Date: 11/28/2022 See surgical note for result.  DG Foot Complete Left  Result Date: 11/27/2022 CLINICAL DATA:  Left great toe gangrene. EXAM: LEFT FOOT - COMPLETE 3+ VIEW COMPARISON:  None Available. FINDINGS: Mild hallux valgus. Mildly decreased bone mineralization. Moderate great toe soft tissue swelling. No definite cortical erosion is seen. No subcutaneous air. Joint spaces are preserved. No acute fracture dislocation. High-grade atherosclerotic calcifications. IMPRESSION: Moderate great toe soft tissue swelling. No definite cortical erosion is seen. No subcutaneous air. Electronically Signed   By: Neita Garnet M.D.   On: 11/27/2022 13:31    Microbiology: Results for orders placed or performed during the hospital encounter of 12/17/22  Blood Culture (routine x 2)     Status: None (Preliminary result)   Collection Time: 12/17/22  4:47 PM   Specimen: BLOOD  Result Value Ref Range Status   Specimen Description BLOOD BLOOD RIGHT FOREARM  Final   Special Requests   Final    BOTTLES DRAWN AEROBIC AND ANAEROBIC Blood Culture adequate volume   Culture   Final    NO GROWTH 3 DAYS Performed at [facility], [address], [place], [place] [address]    Report Status PENDING  Incomplete  Blood Culture (routine x 2)     Status: None (Preliminary result)   Collection Time: 12/17/22  4:47 PM   Specimen:  BLOOD  Result Value Ref Range Status   Specimen Description BLOOD BLOOD LEFT FOREARM  Final   Special Requests   Final    BOTTLES DRAWN AEROBIC AND ANAEROBIC Blood Culture results may not be optimal due to an excessive volume of blood received in culture bottles   Culture   Final    NO GROWTH 3 DAYS Performed at [facility], [address], [place], [place] [address]    Report Status PENDING  Incomplete  Surgical pcr screen     Status: None   Collection Time: 12/18/22  5:54 AM   Specimen: Nasal Mucosa; Nasal Swab  Result Value Ref Range Status   MRSA, PCR NEGATIVE NEGATIVE Final   Staphylococcus aureus NEGATIVE NEGATIVE Final    Comment: (NOTE) The Xpert SA Assay (FDA approved for NASAL specimens in patients 67 years of age and older), is one component of a comprehensive surveillance program.  It is not intended to diagnose infection nor to guide or monitor treatment. Performed at [facility], [address] [place], [place] [address]     Labs: CBC: Recent Labs  Lab 12/17/22 1622 12/17/22 2036 12/18/22 0235 12/19/22 0343 12/20/22 0419  WBC 6.6 7.2 7.6 7.1 6.5  NEUTROABS 4.3  --   --  4.7 5.3  HGB 17.9* 15.2 15.0 14.7 14.9  HCT 54.0* 45.5 43.5 42.5 43.7  MCV 97.5 93.6 93.1 92.2 93.2  PLT 220 208 219 224 199   Basic Metabolic Panel: Recent Labs  Lab 12/17/22 1622 12/17/22 2036 12/18/22 0235 12/19/22 0343 12/20/22 0713  NA 134*  --  137 139 136  K 4.4  --  3.9 3.9 4.4  CL 101  --  106 109 109  CO2 22  --  22 23 21*  GLUCOSE 226*  --  203* 49* 85  BUN 27*  --  27* 20 13  CREATININE 1.61* 1.72* 1.56* 1.26* 0.99  CALCIUM 8.9  --  8.2* 8.5* 8.4*   Liver Function Tests: Recent Labs  Lab 12/17/22 1622  AST 10*  ALT 15  ALKPHOS 72  BILITOT 0.6  PROT 7.7  ALBUMIN 3.5   CBG: Recent Labs  Lab 12/19/22 1148 12/19/22 1315 12/19/22 1638 12/19/22 2159 12/20/22 0845  GLUCAP 84 77 136* 121* 120*    Discharge time spent:  35  minutes.  Signed: Loyce Dys, MD [facility] 12/20/2022

## 2022-12-20 NOTE — Anesthesia Postprocedure Evaluation (Signed)
Anesthesia Post Note  Patient: Corey Foster  Procedure(s) Performed: FIRST LEFT RAY AMPUTATION (Left: Foot)  Patient location during evaluation: PACU Anesthesia Type: General Level of consciousness: awake and alert Pain management: pain level controlled Vital Signs Assessment: post-procedure vital signs reviewed and stable Respiratory status: spontaneous breathing, nonlabored ventilation, respiratory function stable and patient connected to nasal cannula oxygen Cardiovascular status: blood pressure returned to baseline and stable Postop Assessment: no apparent nausea or vomiting Anesthetic complications: no   No notable events documented.   Last Vitals:  Vitals:   12/20/22 0518 12/20/22 0746  BP: 119/80 130/75  Pulse: 100 96  Resp: 18 15  Temp: 37.2 C 37.1 C  SpO2: 100% 99%    Last Pain:  Vitals:   12/20/22 0518  TempSrc: Oral  PainSc:                  Louie Boston

## 2022-12-20 NOTE — Progress Notes (Signed)
PODIATRY / FOOT AND ANKLE SURGERY PROGRESS NOTE   HPI: Corey Foster is a 67 y.o. male who presents status post 1 day left partial first ray amputation.  Patient notes no pain to the area and has kept his dressings clean, dry, and intact.  Patient currently denies nausea, vomiting, fevers, chills.  He has tried to keep off his foot is much as possible and is wearing a postoperative shoe today.  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   Sleep apnea     Surgical Hx:  Past Surgical History:  Procedure Laterality Date   AMPUTATION Left 11/29/2022   Procedure: AMPUTATION RAY, PARTIAL 1ST RAY;  Surgeon: Rosetta Posner, DPM;  Location: [facility];  Service: Orthopedics/Podiatry;  Laterality: Left;   CYSTOSCOPY W/ RETROGRADES Bilateral 05/31/2019   Procedure: CYSTOSCOPY WITH RETROGRADE PYELOGRAM;  Surgeon: Riki Altes, MD;  Location: [facility];  Service: Urology;  Laterality: Bilateral;   LOWER EXTREMITY ANGIOGRAPHY Left 11/28/2022   Procedure: Lower Extremity Angiography;  Surgeon: Annice Needy, MD;  Location: [facility];  Service: Cardiovascular;  Laterality: Left;   LOWER EXTREMITY ANGIOGRAPHY Left 12/18/2022   Procedure: Lower Extremity Angiography;  Surgeon: Annice Needy, MD;  Location: [facility];  Service: Cardiovascular;  Laterality: Left;   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-C N/A 05/31/2019   Procedure: TRANSURETHRAL RESECTION OF BLADDER TUMOR WITH gemcitabine;  Surgeon: Riki Altes, MD;  Location: [facility];  Service: Urology;  Laterality: N/A;    FHx:  Family History  Problem Relation Age of Onset   Diabetes Mother    Heart disease Mother    Diabetes Father    Heart disease Father    Diabetes Brother      Social History:  reports that he has been smoking cigarettes. He has a 10 pack-year smoking history. He has never used smokeless tobacco. He reports current alcohol use of about 18.0 standard drinks of alcohol per week. He reports that he does not currently use drugs after having used the following drugs: Cocaine and Marijuana.  Allergies:  Allergies  Allergen Reactions   Nsaids Other (See Comments)    AKI AKI     Medications Prior to Admission  Medication Sig Dispense Refill   ALPRAZolam (XANAX) 1 MG tablet Take 1 mg by mouth 4 (four) times daily as needed.     aspirin EC 325 MG tablet Take 325 mg by mouth daily.     baclofen (LIORESAL) 10 MG tablet Take 10 mg by mouth 2 (two) times daily.      cholecalciferol (VITAMIN D) 400 UNITS TABS tablet Take 10,000 Units by mouth daily.      furosemide (LASIX) 20 MG tablet Take 20 mg by mouth daily.     JARDIANCE 25 MG TABS tablet Take 25 mg by mouth daily.     losartan (COZAAR) 100 MG tablet Take 100 mg by mouth daily.      naloxone (NARCAN) 0.4 MG/ML injection Inject as directed as directed     NOVOLOG FLEXPEN 100 UNIT/ML FlexPen Inject 40 Units into the skin 3 (three) times daily with meals.     Omega-3 Fatty Acids (FISH OIL) 1000 MG CAPS Take 1 capsule  by mouth daily.      Oxycodone HCl 20 MG TABS Take 20 mg by mouth 4 (four) times daily as needed (pain).      sildenafil (REVATIO) 20 MG tablet TAKE 2 TO 5 TABLETS BY MOUTH 1 HOUR PRIOR TO INTERCOURSE 30 tablet 0   simvastatin (ZOCOR) 40 MG tablet Take 1 tablet (40 mg total) by mouth daily. 30 tablet 0   Testosterone 20.25 MG/ACT (1.62%) GEL APPLY 4 APPLICATIONS TOPICALLY DAILY. 150 g 2   TOUJEO MAX SOLOSTAR 300 UNIT/ML Solostar Pen Inject 80-120 Units into the skin in the morning and at bedtime. Inject 80 Units into the skin daily. 80 units in the am and 120 units at bedtime     vitamin C (ASCORBIC ACID) 250 MG tablet Take 250 mg by mouth daily.     albuterol (VENTOLIN HFA) 108 (90 Base)  MCG/ACT inhaler Inhale 1 puff into the lungs every 6 (six) hours as needed for wheezing or shortness of breath.  (Patient not taking: Reported on 12/17/2022)     AMBULATORY NON FORMULARY MEDICATION Trimix (30/1/10)-(Pap/Phent/PGE)  Dosage: Inject 0.3 cc and may increase 0.1cc to achieve and erection lasting no longer than 1 hour per injection   Vial 1ml  Qty #5 refills 2  [facility] [phone] Fax [phone] 5 mL 2   Nutritional Supplements (KETO PO) Take 1 capsule by mouth.     TRULICITY 1.5 MG/0.5ML SOPN Inject 1.5 mg into the skin once a week. (Patient not taking: Reported on 12/17/2022)      Physical Exam: General: Alert and oriented.  No apparent distress.  Vascular: DP/PT pulses nonpalpable bilateral, both feet appear to be warm to touch, no hair growth noted to bilateral lower extremities.  Capillary fill time appears to be intact to all remaining digits to both feet.  Neuro: Light touch sensation reduced to nearly absent to bilateral lower extremities.  Derm: Left partial first ray amputation site appears to be well coapted with sutures intact, no active drainage, no erythema, mild edema, no odor, no obvious evidence of gangrenous necrosis currently.    MSK: Left partial first ray amputation, no pain on palpation  Results for orders placed or performed during the hospital encounter of 12/17/22 (from the past 48 hour(s))  Glucose, capillary     Status: Abnormal   Collection Time: 12/18/22 11:05 AM  Result Value Ref Range   Glucose-Capillary 200 (H) 70 - 99 mg/dL    Comment: Glucose reference range applies only to samples taken after fasting for at least 8 hours.  Glucose, capillary     Status: Abnormal   Collection Time: 12/18/22  1:14 PM  Result Value Ref Range   Glucose-Capillary 164 (H) 70 - 99 mg/dL    Comment: Glucose reference range applies only to samples taken after fasting for at least 8 hours.  Glucose, capillary     Status: None   Collection  Time: 12/18/22  4:30 PM  Result Value Ref Range   Glucose-Capillary 97 70 - 99 mg/dL    Comment: Glucose reference range applies only to samples taken after fasting for at least 8 hours.  Glucose, capillary     Status: Abnormal   Collection Time: 12/18/22  9:30 PM  Result Value Ref Range   Glucose-Capillary 121 (H) 70 - 99 mg/dL    Comment: Glucose reference range applies only to samples taken after fasting for at least 8 hours.  Glucose, capillary     Status: None   Collection  Time: 12/18/22 10:46 PM  Result Value Ref Range   Glucose-Capillary 85 70 - 99 mg/dL    Comment: Glucose reference range applies only to samples taken after fasting for at least 8 hours.  CBC with Differential/Platelet     Status: None   Collection Time: 12/19/22  3:43 AM  Result Value Ref Range   WBC 7.1 4.0 - 10.5 K/uL   RBC 4.61 4.22 - 5.81 MIL/uL   Hemoglobin 14.7 13.0 - 17.0 g/dL   HCT 16.1 09.6 - 04.5 %   MCV 92.2 80.0 - 100.0 fL   MCH 31.9 26.0 - 34.0 pg   MCHC 34.6 30.0 - 36.0 g/dL   RDW 40.9 81.1 - 91.4 %   Platelets 224 150 - 400 K/uL   nRBC 0.0 0.0 - 0.2 %   Neutrophils Relative % 66 %   Neutro Abs 4.7 1.7 - 7.7 K/uL   Lymphocytes Relative 24 %   Lymphs Abs 1.7 0.7 - 4.0 K/uL   Monocytes Relative 9 %   Monocytes Absolute 0.6 0.1 - 1.0 K/uL   Eosinophils Relative 1 %   Eosinophils Absolute 0.1 0.0 - 0.5 K/uL   Basophils Relative 0 %   Basophils Absolute 0.0 0.0 - 0.1 K/uL   Immature Granulocytes 0 %   Abs Immature Granulocytes 0.03 0.00 - 0.07 K/uL    Comment: Performed at [facility], [address], [place], [place] [address]  Basic metabolic panel     Status: Abnormal   Collection Time: 12/19/22  3:43 AM  Result Value Ref Range   Sodium 139 135 - 145 mmol/L   Potassium 3.9 3.5 - 5.1 mmol/L   Chloride 109 98 - 111 mmol/L   CO2 23 22 - 32 mmol/L   Glucose, Bld 49 (L) 70 - 99 mg/dL    Comment: Glucose reference range applies only to samples taken after fasting for at least 8  hours.   BUN 20 8 - 23 mg/dL   Creatinine, Ser 6.21 (H) 0.61 - 1.24 mg/dL   Calcium 8.5 (L) 8.9 - 10.3 mg/dL   GFR, Estimated >30 >86 mL/min    Comment: (NOTE) Calculated using the CKD-EPI Creatinine Equation (2021)    Anion gap 7 5 - 15    Comment: Performed at [facility], [address] [place], [place] [address]  Glucose, capillary     Status: Abnormal   Collection Time: 12/19/22  5:22 AM  Result Value Ref Range   Glucose-Capillary 54 (L) 70 - 99 mg/dL    Comment: Glucose reference range applies only to samples taken after fasting for at least 8 hours.  Glucose, capillary     Status: Abnormal   Collection Time: 12/19/22  6:24 AM  Result Value Ref Range   Glucose-Capillary 117 (H) 70 - 99 mg/dL    Comment: Glucose reference range applies only to samples taken after fasting for at least 8 hours.  Glucose, capillary     Status: None   Collection Time: 12/19/22  8:26 AM  Result Value Ref Range   Glucose-Capillary 92 70 - 99 mg/dL    Comment: Glucose reference range applies only to samples taken after fasting for at least 8 hours.  Glucose, capillary     Status: None   Collection Time: 12/19/22 11:48 AM  Result Value Ref Range   Glucose-Capillary 84 70 - 99 mg/dL    Comment: Glucose reference range applies only to samples taken after fasting for at least 8 hours.  Glucose, capillary     Status: None   Collection Time: 12/19/22  1:15 PM  Result Value Ref Range   Glucose-Capillary 77 70 - 99 mg/dL    Comment: Glucose reference range applies only to samples taken after fasting for at least 8 hours.  Glucose, capillary     Status: Abnormal   Collection Time: 12/19/22  4:38 PM  Result Value Ref Range   Glucose-Capillary 136 (H) 70 - 99 mg/dL    Comment: Glucose reference range applies only to samples taken after fasting for at least 8 hours.  Glucose, capillary     Status: Abnormal   Collection Time: 12/19/22  9:59 PM  Result Value Ref Range   Glucose-Capillary 121  (H) 70 - 99 mg/dL    Comment: Glucose reference range applies only to samples taken after fasting for at least 8 hours.  CBC with Differential/Platelet     Status: None   Collection Time: 12/20/22  4:19 AM  Result Value Ref Range   WBC 6.5 4.0 - 10.5 K/uL   RBC 4.69 4.22 - 5.81 MIL/uL   Hemoglobin 14.9 13.0 - 17.0 g/dL   HCT 32.4 40.1 - 02.7 %   MCV 93.2 80.0 - 100.0 fL   MCH 31.8 26.0 - 34.0 pg   MCHC 34.1 30.0 - 36.0 g/dL   RDW 25.3 66.4 - 40.3 %   Platelets 199 150 - 400 K/uL   nRBC 0.0 0.0 - 0.2 %   Neutrophils Relative % 82 %   Neutro Abs 5.3 1.7 - 7.7 K/uL   Lymphocytes Relative 10 %   Lymphs Abs 0.7 0.7 - 4.0 K/uL   Monocytes Relative 7 %   Monocytes Absolute 0.5 0.1 - 1.0 K/uL   Eosinophils Relative 1 %   Eosinophils Absolute 0.1 0.0 - 0.5 K/uL   Basophils Relative 0 %   Basophils Absolute 0.0 0.0 - 0.1 K/uL   Immature Granulocytes 0 %   Abs Immature Granulocytes 0.02 0.00 - 0.07 K/uL    Comment: Performed at [facility], [address] [place], [place] [address]  Basic metabolic panel     Status: Abnormal   Collection Time: 12/20/22  7:13 AM  Result Value Ref Range   Sodium 136 135 - 145 mmol/L   Potassium 4.4 3.5 - 5.1 mmol/L   Chloride 109 98 - 111 mmol/L   CO2 21 (L) 22 - 32 mmol/L   Glucose, Bld 85 70 - 99 mg/dL    Comment: Glucose reference range applies only to samples taken after fasting for at least 8 hours.   BUN 13 8 - 23 mg/dL   Creatinine, Ser 9.56 0.61 - 1.24 mg/dL   Calcium 8.4 (L) 8.9 - 10.3 mg/dL   GFR, Estimated >38 >75 mL/min    Comment: (NOTE) Calculated using the CKD-EPI Creatinine Equation (2021)    Anion gap 6 5 - 15    Comment: Performed at [facility], [address] [place], [place] [address]  Glucose, capillary     Status: Abnormal   Collection Time: 12/20/22  8:45 AM  Result Value Ref Range   Glucose-Capillary 120 (H) 70 - 99 mg/dL    Comment: Glucose reference range applies only to samples taken after  fasting for at least 8 hours.   DG Foot Complete Left  Result Date: 12/19/2022 CLINICAL DATA:  Postoperative, amputation, wound check EXAM: LEFT FOOT - COMPLETE 3+ VIEW COMPARISON:  12/17/2022 FINDINGS: Interval amputation of the first digit to  the proximal metaphysis of the first metatarsal. Overall expected postoperative appearance with overlying bandaging, and a small amount of residual postoperative gas along the soft tissues. No bony destructive findings characteristic of osteomyelitis noted. Mild dorsal soft tissue swelling along the forefoot. Atheromatous vascular calcifications. IMPRESSION: 1. Interval amputation of the first digit to the proximal metaphysis of the first metatarsal. Expected postoperative appearance. 2. Mild dorsal soft tissue swelling along the forefoot. 3. Atheromatous vascular calcifications. Electronically Signed   By: Gaylyn Rong M.D.   On: 12/19/2022 17:44   DG MINI C-ARM IMAGE ONLY  Result Date: 12/19/2022 There is no interpretation for this exam.  This order is for images obtained during a surgical procedure.  Please See "Surgeries" Tab for more information regarding the procedure.   PERIPHERAL VASCULAR CATHETERIZATION  Result Date: 12/18/2022 See surgical note for result.   Blood pressure 130/75, pulse 96, temperature 98.8 F (37.1 C), resp. rate 15, height 5\' 11"  (1.803 m), weight 112.9 kg, SpO2 99%.  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 -Postop x-ray imaging reviewed. -Incision site appears to be well coapted with sutures intact, no dehiscence or signs of infection present today, no obvious evidence of any further necrosis at this time. -Reapplied Xeroform to the incisional area followed by 4 x 4 gauze, ABD, Kerlix, Ace wrap.  Patient to keep dressings  clean, dry, and intact until outpatient visit hopefully later next week. -Patient to continue with partial weightbearing with heel contact and postop shoe for only short distances.  Should not be on foot for long periods of time and she keep it to simple tasks. -Path report pending.  Believe that any infection was removed with amputation.  Recommend discharging on Augmentin 7-day course. -Discussed since patient does have poor circulation that incisional area and amputation may not heal.  If does not heal could consider revision to transmetatarsal amputation for 1 more try at it.  If that does not work the patient will ultimately need a more proximal amputation.  Discussed that patient is at high risk for having this in the future.  No guarantees given.  Podiatry team to sign off at this time.  Discharge instructions placed in chart.  Rosetta Posner, DPM 12/20/2022, 9:20 AM

## 2022-12-20 NOTE — Discharge Instructions (Signed)
[facility]  POST OPERATIVE INSTRUCTIONS FOR DR. Ether Griffins AND DR. Kimber Fritts [facility] PODIATRY DEPARTMENT   Take your medication as prescribed.  Pain medication should be taken only as needed.  Keep the dressing clean, dry and intact.  Remain partial weightbearing with heel contact and postop shoe to the left foot.  Should not be on left foot for long periods of time.  Walking to the bathroom and brief periods of walking are acceptable, unless we have instructed you to be non-weight bearing.  Always wear your post-op shoe when walking.  Always use your crutches if you are to be non-weight bearing.  Do not take a shower. Baths are permissible as long as the foot is kept out of the water.   Every hour you are awake:  Bend your knee 15 times. Flex foot 15 times Massage calf 15 times  Call [facility] [phone]) if any of the following problems occur: You develop a temperature or fever. The bandage becomes saturated with blood. Medication does not stop your pain. Injury of the foot occurs. Any symptoms of infection including redness, odor, or red streaks running from wound.

## 2022-12-21 ENCOUNTER — Encounter: Payer: Self-pay | Admitting: Podiatry

## 2022-12-22 ENCOUNTER — Encounter: Payer: Self-pay | Admitting: Vascular Surgery

## 2022-12-22 LAB — CULTURE, BLOOD (ROUTINE X 2)
Culture: NO GROWTH
Culture: NO GROWTH
Special Requests: ADEQUATE

## 2022-12-23 LAB — SURGICAL PATHOLOGY

## 2023-01-07 ENCOUNTER — Encounter: Payer: Self-pay | Admitting: Internal Medicine

## 2023-01-07 ENCOUNTER — Emergency Department: Payer: Medicare HMO

## 2023-01-07 ENCOUNTER — Inpatient Hospital Stay
Admission: EM | Admit: 2023-01-07 | Discharge: 2023-01-14 | DRG: 475 | Disposition: A | Discharge status: Skilled Nursing Facility | Payer: Medicare HMO | Attending: Student | Admitting: Student

## 2023-01-07 ENCOUNTER — Other Ambulatory Visit: Payer: Self-pay

## 2023-01-07 DIAGNOSIS — Z79899 Other long term (current) drug therapy: Secondary | ICD-10-CM | POA: Diagnosis not present

## 2023-01-07 DIAGNOSIS — E1140 Type 2 diabetes mellitus with diabetic neuropathy, unspecified: Secondary | ICD-10-CM | POA: Diagnosis present

## 2023-01-07 DIAGNOSIS — E1165 Type 2 diabetes mellitus with hyperglycemia: Secondary | ICD-10-CM | POA: Diagnosis present

## 2023-01-07 DIAGNOSIS — K219 Gastro-esophageal reflux disease without esophagitis: Secondary | ICD-10-CM | POA: Diagnosis present

## 2023-01-07 DIAGNOSIS — I96 Gangrene, not elsewhere classified: Secondary | ICD-10-CM | POA: Diagnosis not present

## 2023-01-07 DIAGNOSIS — Z886 Allergy status to analgesic agent status: Secondary | ICD-10-CM

## 2023-01-07 DIAGNOSIS — E669 Obesity, unspecified: Secondary | ICD-10-CM | POA: Diagnosis present

## 2023-01-07 DIAGNOSIS — I70222 Atherosclerosis of native arteries of extremities with rest pain, left leg: Secondary | ICD-10-CM | POA: Diagnosis present

## 2023-01-07 DIAGNOSIS — T8781 Dehiscence of amputation stump: Secondary | ICD-10-CM | POA: Diagnosis present

## 2023-01-07 DIAGNOSIS — G4730 Sleep apnea, unspecified: Secondary | ICD-10-CM | POA: Diagnosis present

## 2023-01-07 DIAGNOSIS — F17210 Nicotine dependence, cigarettes, uncomplicated: Secondary | ICD-10-CM | POA: Diagnosis present

## 2023-01-07 DIAGNOSIS — Z6834 Body mass index (BMI) 34.0-34.9, adult: Secondary | ICD-10-CM

## 2023-01-07 DIAGNOSIS — M86662 Other chronic osteomyelitis, left tibia and fibula: Secondary | ICD-10-CM | POA: Diagnosis present

## 2023-01-07 DIAGNOSIS — E11628 Type 2 diabetes mellitus with other skin complications: Secondary | ICD-10-CM | POA: Diagnosis present

## 2023-01-07 DIAGNOSIS — Z794 Long term (current) use of insulin: Secondary | ICD-10-CM | POA: Diagnosis not present

## 2023-01-07 DIAGNOSIS — Z8249 Family history of ischemic heart disease and other diseases of the circulatory system: Secondary | ICD-10-CM

## 2023-01-07 DIAGNOSIS — Z7984 Long term (current) use of oral hypoglycemic drugs: Secondary | ICD-10-CM | POA: Diagnosis not present

## 2023-01-07 DIAGNOSIS — E88819 Insulin resistance, unspecified: Secondary | ICD-10-CM | POA: Diagnosis present

## 2023-01-07 DIAGNOSIS — L97529 Non-pressure chronic ulcer of other part of left foot with unspecified severity: Secondary | ICD-10-CM | POA: Diagnosis present

## 2023-01-07 DIAGNOSIS — E1169 Type 2 diabetes mellitus with other specified complication: Secondary | ICD-10-CM | POA: Diagnosis present

## 2023-01-07 DIAGNOSIS — E1152 Type 2 diabetes mellitus with diabetic peripheral angiopathy with gangrene: Secondary | ICD-10-CM | POA: Diagnosis present

## 2023-01-07 DIAGNOSIS — L97509 Non-pressure chronic ulcer of other part of unspecified foot with unspecified severity: Secondary | ICD-10-CM | POA: Diagnosis not present

## 2023-01-07 DIAGNOSIS — Z79890 Hormone replacement therapy (postmenopausal): Secondary | ICD-10-CM

## 2023-01-07 DIAGNOSIS — Z7982 Long term (current) use of aspirin: Secondary | ICD-10-CM

## 2023-01-07 DIAGNOSIS — I10 Essential (primary) hypertension: Secondary | ICD-10-CM | POA: Diagnosis present

## 2023-01-07 DIAGNOSIS — Z833 Family history of diabetes mellitus: Secondary | ICD-10-CM

## 2023-01-07 DIAGNOSIS — E11621 Type 2 diabetes mellitus with foot ulcer: Secondary | ICD-10-CM | POA: Diagnosis present

## 2023-01-07 DIAGNOSIS — E785 Hyperlipidemia, unspecified: Secondary | ICD-10-CM | POA: Diagnosis present

## 2023-01-07 DIAGNOSIS — T148XXD Other injury of unspecified body region, subsequent encounter: Secondary | ICD-10-CM | POA: Diagnosis present

## 2023-01-07 DIAGNOSIS — T8744 Infection of amputation stump, left lower extremity: Secondary | ICD-10-CM | POA: Diagnosis present

## 2023-01-07 DIAGNOSIS — Y835 Amputation of limb(s) as the cause of abnormal reaction of the patient, or of later complication, without mention of misadventure at the time of the procedure: Secondary | ICD-10-CM | POA: Diagnosis present

## 2023-01-07 LAB — URINALYSIS, W/ REFLEX TO CULTURE (INFECTION SUSPECTED)
Bacteria, UA: NONE SEEN
Bilirubin Urine: NEGATIVE
Glucose, UA: >=500 mg/dL — AB
Hgb urine dipstick: NEGATIVE
Ketones, ur: NEGATIVE mg/dL
Leukocytes,Ua: NEGATIVE
Nitrite: NEGATIVE
Protein, ur: NEGATIVE mg/dL
Specific Gravity, Urine: 1.024 (ref 1.005–1.030)
pH: 5 (ref 5.0–8.0)

## 2023-01-07 LAB — LIPID PANEL
Cholesterol: 110 mg/dL (ref 0–200)
HDL: 38 mg/dL — ABNORMAL LOW (ref >40)
LDL Cholesterol: 52 mg/dL (ref 0–99)
Total CHOL/HDL Ratio: 2.9 {ratio}
Triglycerides: 102 mg/dL (ref <150)
VLDL: 20 mg/dL (ref 0–40)

## 2023-01-07 LAB — CBC WITH DIFFERENTIAL/PLATELET
Abs Immature Granulocytes: 0.05 10*3/uL (ref 0.00–0.07)
Basophils Absolute: 0 10*3/uL (ref 0.0–0.1)
Basophils Relative: 0 %
Eosinophils Absolute: 0.1 10*3/uL (ref 0.0–0.5)
Eosinophils Relative: 1 %
HCT: 44.6 % (ref 39.0–52.0)
Hemoglobin: 15.1 g/dL (ref 13.0–17.0)
Immature Granulocytes: 1 %
Lymphocytes Relative: 18 %
Lymphs Abs: 1.4 10*3/uL (ref 0.7–4.0)
MCH: 31.9 pg (ref 26.0–34.0)
MCHC: 33.9 g/dL (ref 30.0–36.0)
MCV: 94.1 fL (ref 80.0–100.0)
Monocytes Absolute: 0.7 10*3/uL (ref 0.1–1.0)
Monocytes Relative: 10 %
Neutro Abs: 5.5 10*3/uL (ref 1.7–7.7)
Neutrophils Relative %: 70 %
Platelets: 263 10*3/uL (ref 150–400)
RBC: 4.74 MIL/uL (ref 4.22–5.81)
RDW: 14.3 % (ref 11.5–15.5)
WBC: 7.8 10*3/uL (ref 4.0–10.5)
nRBC: 0 % (ref 0.0–0.2)

## 2023-01-07 LAB — COMPREHENSIVE METABOLIC PANEL
ALT: 21 U/L (ref 0–44)
AST: 21 U/L (ref 15–41)
Albumin: 3.2 g/dL — ABNORMAL LOW (ref 3.5–5.0)
Alkaline Phosphatase: 63 U/L (ref 38–126)
Anion gap: 9 (ref 5–15)
BUN: 20 mg/dL (ref 8–23)
CO2: 24 mmol/L (ref 22–32)
Calcium: 8.7 mg/dL — ABNORMAL LOW (ref 8.9–10.3)
Chloride: 106 mmol/L (ref 98–111)
Creatinine, Ser: 1.24 mg/dL (ref 0.61–1.24)
GFR, Estimated: >60 mL/min (ref >60)
Glucose, Bld: 235 mg/dL — ABNORMAL HIGH (ref 70–99)
Potassium: 4.1 mmol/L (ref 3.5–5.1)
Sodium: 139 mmol/L (ref 135–145)
Total Bilirubin: 0.3 mg/dL (ref <1.2)
Total Protein: 7.2 g/dL (ref 6.5–8.1)

## 2023-01-07 LAB — GLUCOSE, CAPILLARY: Glucose-Capillary: 239 mg/dL — ABNORMAL HIGH (ref 70–99)

## 2023-01-07 LAB — LACTIC ACID, PLASMA: Lactic Acid, Venous: 1.6 mmol/L (ref 0.5–1.9)

## 2023-01-07 MED ORDER — HYDROMORPHONE HCL 1 MG/ML IJ SOLN
0.5000 mg | INTRAMUSCULAR | Status: DC | PRN
  Administered 2023-01-07 – 2023-01-12 (×5): 1 mg via INTRAVENOUS
  Administered 2023-01-13: 0.5 mg via INTRAVENOUS
  Filled 2023-01-07 (×6): qty 1

## 2023-01-07 MED ORDER — ENOXAPARIN SODIUM 60 MG/0.6ML IJ SOSY
55.0000 mg | PREFILLED_SYRINGE | INTRAMUSCULAR | Status: DC
  Administered 2023-01-07: 55 mg via SUBCUTANEOUS
  Filled 2023-01-07: qty 0.6

## 2023-01-07 MED ORDER — INSULIN ASPART 100 UNIT/ML IJ SOLN
0.0000 [IU] | Freq: Every day | INTRAMUSCULAR | Status: DC
  Administered 2023-01-07: 2 [IU] via SUBCUTANEOUS
  Administered 2023-01-08: 3 [IU] via SUBCUTANEOUS
  Administered 2023-01-10: 2 [IU] via SUBCUTANEOUS
  Administered 2023-01-11: 4 [IU] via SUBCUTANEOUS
  Administered 2023-01-12: 2 [IU] via SUBCUTANEOUS
  Filled 2023-01-07 (×5): qty 1

## 2023-01-07 MED ORDER — NICOTINE 21 MG/24HR TD PT24
21.0000 mg | MEDICATED_PATCH | Freq: Every day | TRANSDERMAL | Status: DC
  Filled 2023-01-07 (×5): qty 1

## 2023-01-07 MED ORDER — EMPAGLIFLOZIN 25 MG PO TABS
25.0000 mg | ORAL_TABLET | Freq: Every day | ORAL | Status: DC
  Administered 2023-01-07 – 2023-01-14 (×6): 25 mg via ORAL
  Filled 2023-01-07 (×9): qty 1

## 2023-01-07 MED ORDER — FUROSEMIDE 20 MG PO TABS
20.0000 mg | ORAL_TABLET | Freq: Every day | ORAL | Status: DC
  Administered 2023-01-10 – 2023-01-12 (×3): 20 mg via ORAL
  Filled 2023-01-07 (×3): qty 1

## 2023-01-07 MED ORDER — INSULIN ASPART 100 UNIT/ML IJ SOLN
0.0000 [IU] | Freq: Three times a day (TID) | INTRAMUSCULAR | Status: DC
  Administered 2023-01-10 – 2023-01-11 (×2): 3 [IU] via SUBCUTANEOUS
  Administered 2023-01-11: 2 [IU] via SUBCUTANEOUS
  Administered 2023-01-11 – 2023-01-12 (×2): 8 [IU] via SUBCUTANEOUS
  Administered 2023-01-12: 2 [IU] via SUBCUTANEOUS
  Administered 2023-01-12 – 2023-01-13 (×3): 5 [IU] via SUBCUTANEOUS
  Administered 2023-01-13 – 2023-01-14 (×2): 2 [IU] via SUBCUTANEOUS
  Administered 2023-01-14 (×2): 3 [IU] via SUBCUTANEOUS
  Filled 2023-01-07 (×12): qty 1

## 2023-01-07 MED ORDER — ONDANSETRON HCL 4 MG PO TABS: 4.0000 mg | ORAL_TABLET | Freq: Four times a day (QID) | ORAL | Status: DC | PRN

## 2023-01-07 MED ORDER — INSULIN ASPART 100 UNIT/ML FLEXPEN: 40.0000 [IU] | PEN_INJECTOR | Freq: Three times a day (TID) | SUBCUTANEOUS | Status: DC

## 2023-01-07 MED ORDER — ONDANSETRON HCL 4 MG/2ML IJ SOLN: 4.0000 mg | Freq: Four times a day (QID) | INTRAMUSCULAR | Status: DC | PRN

## 2023-01-07 MED ORDER — BACLOFEN 10 MG PO TABS
10.0000 mg | ORAL_TABLET | Freq: Two times a day (BID) | ORAL | Status: DC
  Administered 2023-01-07 – 2023-01-14 (×13): 10 mg via ORAL
  Filled 2023-01-07 (×14): qty 1

## 2023-01-07 MED ORDER — OXYCODONE HCL 5 MG PO TABS
20.0000 mg | ORAL_TABLET | Freq: Four times a day (QID) | ORAL | Status: DC | PRN
  Administered 2023-01-07: 20 mg via ORAL
  Filled 2023-01-07: qty 4

## 2023-01-07 MED ORDER — INSULIN GLARGINE-YFGN 100 UNIT/ML ~~LOC~~ SOLN
40.0000 [IU] | Freq: Every day | SUBCUTANEOUS | Status: DC
Start: 2023-01-07 — End: 2023-01-08
  Administered 2023-01-07: 40 [IU] via SUBCUTANEOUS
  Filled 2023-01-07: qty 0.4

## 2023-01-07 MED ORDER — ALPRAZOLAM 1 MG PO TABS
1.0000 mg | ORAL_TABLET | Freq: Four times a day (QID) | ORAL | Status: DC | PRN
  Administered 2023-01-10: 1 mg via ORAL
  Filled 2023-01-07: qty 1

## 2023-01-07 MED ORDER — AMOXICILLIN-POT CLAVULANATE 875-125 MG PO TABS: 1.0000 | ORAL_TABLET | Freq: Two times a day (BID) | ORAL | Status: DC

## 2023-01-07 MED ORDER — ASPIRIN 325 MG PO TBEC: 325.0000 mg | DELAYED_RELEASE_TABLET | Freq: Every day | ORAL | Status: DC

## 2023-01-07 MED ORDER — INSULIN GLARGINE (2 UNIT DIAL) 300 UNIT/ML ~~LOC~~ SOPN: 80.0000 [IU] | PEN_INJECTOR | Freq: Two times a day (BID) | SUBCUTANEOUS | Status: DC

## 2023-01-07 MED ORDER — SIMVASTATIN 20 MG PO TABS
40.0000 mg | ORAL_TABLET | Freq: Every day | ORAL | Status: DC
  Administered 2023-01-10 – 2023-01-14 (×5): 40 mg via ORAL
  Filled 2023-01-07 (×2): qty 2
  Filled 2023-01-07: qty 4
  Filled 2023-01-07: qty 2
  Filled 2023-01-07: qty 4
  Filled 2023-01-07 (×2): qty 2

## 2023-01-07 NOTE — H&P (Addendum)
History and Physical    Corey Foster:[id number] DOB: 01-15-1956 DOA: 01/07/2023  PCP: Emogene Morgan, MD (Confirm with patient/family/NH records and if not entered, this has to be entered at [facility] point of entry) Patient coming from: Home  I have personally briefly reviewed patient's old medical records in [facility]  Chief Complaint: Left foot wound not healing right  HPI: Corey Foster is a 67 y.o. 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  Patient was recently hospitalized for a left foot infection after the first toe amputation in early October.  Patient was sent home after IV antibiotics treatment, the initial plan was for patient to complete another 7 days of p.o. antibiotics.  According to the patient however the wound on left foot has not been healing well, the wound has had dehiscence and discoloration.  But he denied any active discharge from the wound or any pain.  Last Friday he went to see podiatry who recommended patient come to the hospital for podiatry intervention.  Patient however had a delay over the weekend due to some family issues and decided to come in today.  Denies any fever or chills. ED Course: WBC 7.8, hemoglobin 15.1, glucose 235 creatinine 1.2.  Review of Systems: As per HPI otherwise 14 point review of systems negative.    Past Medical History:  Diagnosis Date   Arthritis    Diabetes mellitus without complication (HCC)    DJD (degenerative joint disease)    GERD (gastroesophageal reflux disease)    Hyperlipidemia    Hypertension    Sciatic nerve pain    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   Sleep apnea     Past Surgical History:  Procedure Laterality Date   AMPUTATION Left 11/29/2022    Procedure: AMPUTATION RAY, PARTIAL 1ST RAY;  Surgeon: Rosetta Posner, DPM;  Location: [facility] ORS;  Service: Orthopedics/Podiatry;  Laterality: Left;   AMPUTATION Left 12/19/2022   Procedure: FIRST LEFT RAY AMPUTATION;  Surgeon: Rosetta Posner, DPM;  Location: [facility] ORS;  Service: Orthopedics/Podiatry;  Laterality: Left;   CYSTOSCOPY W/ RETROGRADES Bilateral 05/31/2019   Procedure: CYSTOSCOPY WITH RETROGRADE PYELOGRAM;  Surgeon: Riki Altes, MD;  Location: [facility] ORS;  Service: Urology;  Laterality: Bilateral;   LOWER EXTREMITY ANGIOGRAPHY Left 11/28/2022   Procedure: Lower Extremity Angiography;  Surgeon: Annice Needy, MD;  Location: [facility] INVASIVE CV LAB;  Service: Cardiovascular;  Laterality: Left;   LOWER EXTREMITY ANGIOGRAPHY Left 12/18/2022   Procedure: Lower Extremity Angiography;  Surgeon: Annice Needy, MD;  Location: [facility] INVASIVE CV LAB;  Service: Cardiovascular;  Laterality: Left;   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-C N/A 05/31/2019   Procedure: TRANSURETHRAL RESECTION OF BLADDER TUMOR WITH gemcitabine;  Surgeon: Riki Altes, MD;  Location: [facility] ORS;  Service: Urology;  Laterality: N/A;     reports that he has been smoking cigarettes. He has a 10 pack-year smoking history. He has never used smokeless tobacco. He reports current alcohol use of about 18.0 standard drinks of alcohol per week. He reports that he does not currently use drugs after having used the following drugs: Cocaine and Marijuana.  Allergies  Allergen Reactions   Nsaids Other (See Comments)    AKI AKI     Family History  Problem Relation Age  of Onset   Diabetes Mother    Heart disease Mother    Diabetes Father    Heart disease Father    Diabetes Brother      Prior to Admission medications   Medication Sig Start Date End Date Taking? Authorizing Provider  albuterol (VENTOLIN HFA) 108 (90 Base) MCG/ACT inhaler Inhale 1 puff into the lungs every 6 (six) hours as needed for wheezing or  shortness of breath.  Patient not taking: Reported on 12/17/2022 02/15/13   [provider]  ALPRAZolam Prudy Feeler) 1 MG tablet Take 1 mg by mouth 4 (four) times daily as needed. 08/27/19   [provider]  AMBULATORY NON FORMULARY MEDICATION Trimix (30/1/10)-(Pap/Phent/PGE)  Dosage: Inject 0.3 cc and may increase 0.1cc to achieve and erection lasting no longer than 1 hour per injection   Vial 1ml  Qty #5 refills 2  [facility] [phone] Fax [phone] 02/13/22   Riki Altes, MD  amoxicillin-clavulanate (AUGMENTIN) 875-125 MG tablet Take 1 tablet by mouth 2 (two) times daily. 12/20/22   Loyce Dys, MD  aspirin EC 325 MG tablet Take 325 mg by mouth daily.    [provider]  baclofen (LIORESAL) 10 MG tablet Take 10 mg by mouth 2 (two) times daily.  10/07/13   [provider]  cholecalciferol (VITAMIN D) 400 UNITS TABS tablet Take 10,000 Units by mouth daily.     [provider]  furosemide (LASIX) 20 MG tablet Take 20 mg by mouth daily. 01/15/21   [provider]  JARDIANCE 25 MG TABS tablet Take 25 mg by mouth daily. 06/21/21   [provider]  naloxone (NARCAN) 0.4 MG/ML injection Inject as directed as directed    [provider]  NOVOLOG FLEXPEN 100 UNIT/ML FlexPen Inject 40 Units into the skin 3 (three) times daily with meals. 11/29/22   Marrion Coy, MD  Nutritional Supplements (KETO PO) Take 1 capsule by mouth.    [provider]  Omega-3 Fatty Acids (FISH OIL) 1000 MG CAPS Take 1 capsule by mouth daily.  01/14/12   [provider]  Oxycodone HCl 20 MG TABS Take 20 mg by mouth 4 (four) times daily as needed (pain).  06/20/15   [provider]  sildenafil (REVATIO) 20 MG tablet TAKE 2 TO 5 TABLETS BY MOUTH 1 HOUR PRIOR TO INTERCOURSE 10/09/22   Stoioff, Verna Czech, MD  simvastatin (ZOCOR) 40 MG tablet Take 1 tablet (40 mg total) by mouth daily. 11/29/22   Marrion Coy, MD   Testosterone 20.25 MG/ACT (1.62%) GEL APPLY 4 APPLICATIONS TOPICALLY DAILY. 05/12/22   Stoioff, Verna Czech, MD  TOUJEO MAX SOLOSTAR 300 UNIT/ML Solostar Pen Inject 80-120 Units into the skin in the morning and at bedtime. Inject 80 Units into the skin daily. 80 units in the am and 120 units at bedtime    [provider]  TRULICITY 1.5 MG/0.5ML SOPN Inject 1.5 mg into the skin once a week. Patient not taking: Reported on 12/17/2022 01/16/21   [provider]  vitamin C (ASCORBIC ACID) 250 MG tablet Take 250 mg by mouth daily.    [provider]    Physical Exam: Vitals:   01/07/23 1512 01/07/23 1529  BP:  116/73  Pulse:  97  Resp:  16  Temp:  98.2 F (36.8 C)  TempSrc:  Oral  SpO2:  97%  Weight: 112.9 kg   Height: 5\' 11"  (1.803 m)     Constitutional: NAD, calm, comfortable Vitals:  01/07/23 1512 01/07/23 1529  BP:  116/73  Pulse:  97  Resp:  16  Temp:  98.2 F (36.8 C)  TempSrc:  Oral  SpO2:  97%  Weight: 112.9 kg   Height: 5\' 11"  (1.803 m)    Eyes: PERRL, lids and conjunctivae normal ENMT: Mucous membranes are moist. Posterior pharynx clear of any exudate or lesions.Normal dentition.  Neck: normal, supple, no masses, no thyromegaly Respiratory: clear to auscultation bilaterally, no wheezing, no crackles. Normal respiratory effort. No accessory muscle use.  Cardiovascular: Regular rate and rhythm, no murmurs / rubs / gallops. No extremity edema. 2+ pedal pulses. No carotid bruits.  Abdomen: no tenderness, no masses palpated. No hepatosplenomegaly. Bowel sounds positive.  Musculoskeletal: no clubbing / cyanosis. No joint deformity upper and lower extremities. Good ROM, no contractures. Normal muscle tone.  Skin: Nonhealing wound and dry gangrene like changes on medial side of left foot along the first ray Neurologic: CN 2-12 grossly intact. Sensation intact, DTR normal. Strength 5/5 in all 4.  Psychiatric: Normal judgment and insight. Alert and  oriented x 3. Normal mood.     Labs on Admission: I have personally reviewed following labs and imaging studies  CBC: Recent Labs  Lab 01/07/23 1638  WBC 7.8  NEUTROABS 5.5  HGB 15.1  HCT 44.6  MCV 94.1  PLT 263   Basic Metabolic Panel: Recent Labs  Lab 01/07/23 1638  NA 139  K 4.1  CL 106  CO2 24  GLUCOSE 235*  BUN 20  CREATININE 1.24  CALCIUM 8.7*   GFR: Estimated Creatinine Clearance: 73.8 mL/min (by C-G formula based on SCr of 1.24 mg/dL). Liver Function Tests: Recent Labs  Lab 01/07/23 1638  AST 21  ALT 21  ALKPHOS 63  BILITOT 0.3  PROT 7.2  ALBUMIN 3.2*   No results for input(s): "LIPASE", "AMYLASE" in the last 168 hours. No results for input(s): "AMMONIA" in the last 168 hours. Coagulation Profile: No results for input(s): "INR", "PROTIME" in the last 168 hours. Cardiac Enzymes: No results for input(s): "CKTOTAL", "CKMB", "CKMBINDEX", "TROPONINI" in the last 168 hours. BNP (last 3 results) No results for input(s): "PROBNP" in the last 8760 hours. HbA1C: No results for input(s): "HGBA1C" in the last 72 hours. CBG: No results for input(s): "GLUCAP" in the last 168 hours. Lipid Profile: No results for input(s): "CHOL", "HDL", "LDLCALC", "TRIG", "CHOLHDL", "LDLDIRECT" in the last 72 hours. Thyroid Function Tests: No results for input(s): "TSH", "T4TOTAL", "FREET4", "T3FREE", "THYROIDAB" in the last 72 hours. Anemia Panel: No results for input(s): "VITAMINB12", "FOLATE", "FERRITIN", "TIBC", "IRON", "RETICCTPCT" in the last 72 hours. Urine analysis:    Component Value Date/Time   COLORURINE YELLOW 02/07/2021 0908   APPEARANCEUR Clear 08/19/2021 1501   LABSPEC 1.015 02/07/2021 0908   PHURINE 5.5 02/07/2021 0908   GLUCOSEU 2+ (A) 08/19/2021 1501   HGBUR NEGATIVE 02/07/2021 0908   BILIRUBINUR Negative 08/19/2021 1501   KETONESUR 15 (A) 02/07/2021 0908   PROTEINUR Negative 08/19/2021 1501   PROTEINUR NEGATIVE 02/07/2021 0908   NITRITE Negative  08/19/2021 1501   NITRITE NEGATIVE 02/07/2021 0908   LEUKOCYTESUR Negative 08/19/2021 1501   LEUKOCYTESUR NEGATIVE 02/07/2021 0908    Radiological Exams on Admission: DG Chest 2 View  Result Date: 01/07/2023 CLINICAL DATA:  161096 Diabetic retinopathy (HCC) 045409. Chest pain. Shortness of breath. EXAM: CHEST - 2 VIEW COMPARISON:  12/17/2022. FINDINGS: Bilateral lung fields are clear. Bilateral costophrenic angles are clear. Normal cardio-mediastinal silhouette. No acute osseous abnormalities. The soft tissues  are within normal limits. IMPRESSION: *No active cardiopulmonary disease. Electronically Signed   By: Jules Schick M.D.   On: 01/07/2023 17:33    EKG: None  Assessment/Plan Principal Problem:   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) Active Problems:   Dry gangrene (HCC)  (please populate well all problems here in Problem List. (For example, if patient is on BP meds at home and you resume or decide to hold them, it is a problem that needs to be her. Same for CAD, COPD, HLD and so on)  Chronic left diabetic foot infection with dry gangrene like changes -Poor prognosis, even after vascular surgeon's effort of revascularization on recent admission about 3 weeks ago. -Patient has been made aware about likelihood of first-ray and/toe partial foot amputation is expected by podiatry on recent clinic visit.  ED physician paged podiatry Dr. Excell Seltzer who requested that vascular surgery consult for possible BKA. -Clinically there is no significant signs of active infection but predominantly picture implying for dry gangrene like changes.  Will not escalate antibiotics coverage. -Continue amoxicillin twice daily  IDDM with hyperglycemia -Significant insulin resistant, continue long-acting insulin 80 mg twice daily and 3 times daily AC NovoLog 40 units.  Continue Jardiance -Add sliding scale  PVD with recent left tibial artery angioplasty -Continue aspirin -Check lipid  panel  Cigarette smoking -Cessation education performed at bedside.  Patient appears to have no interest in quitting smoking -Nicotine patch  DVT prophylaxis: Lovenox Code Status: Full code) Family Communication: None at bedside Disposition Plan: Patient is sick with left foot unhealing wound and dry gangrene like changes requiring inpatient podiatry consult and podiatry intervention, expect more than 2 midnight hospital stay Consults called: Podiatry Dr. Excell Seltzer Admission status: MedSurg admission   Emeline General MD [facility] Pager [phone]  01/07/2023, 6:00 PM

## 2023-01-07 NOTE — ED Notes (Signed)
Unable to obtain labs in triage. Phlebotomy called

## 2023-01-07 NOTE — ED Triage Notes (Signed)
Sent to ED by Dr. Excell Seltzer for possible amputation.

## 2023-01-07 NOTE — ED Provider Notes (Signed)
[facility] Provider Note    Event Date/Time   First MD Initiated Contact with Patient 01/07/23 1617     (approximate)   History   Foot Problem   HPI Corey Foster is a 67 y.o. male who presents to the emergency department as advised by podiatry because of concerns for poor healing of left amputation site.  Patient had the surgery performed roughly 3 weeks ago.  He has been following up with podiatry.  Patient denies any significant pain.  Denies any fevers, nausea or vomiting.     Physical Exam   Triage Vital Signs: ED Triage Vitals  Encounter Vitals Group     BP 01/07/23 1529 116/73     Systolic BP Percentile --      Diastolic BP Percentile --      Pulse Rate 01/07/23 1529 97     Resp 01/07/23 1529 16     Temp 01/07/23 1529 98.2 F (36.8 C)     Temp Source 01/07/23 1529 Oral     SpO2 01/07/23 1529 97 %     Weight 01/07/23 1512 248 lb 14.4 oz (112.9 kg)     Height 01/07/23 1512 5\' 11"  (1.803 m)     Head Circumference --      Peak Flow --      Pain Score 01/07/23 1512 7     Pain Loc --      Pain Education --      Exclude from Growth Chart --     Most recent vital signs: Vitals:   01/07/23 1529  BP: 116/73  Pulse: 97  Resp: 16  Temp: 98.2 F (36.8 C)  SpO2: 97%   General: Awake, alert, oriented. CV:  Good peripheral perfusion. Resp:  Normal effort.  Abd:  No distention.  Other:  Dehiscence of wound to left foot with some necrotic tissue.   ED Results / Procedures / Treatments   Labs (all labs ordered are listed, but only abnormal results are displayed) Labs Reviewed  COMPREHENSIVE METABOLIC PANEL - Abnormal; Notable for the following components:      Result Value   Glucose, Bld 235 (*)    Calcium 8.7 (*)    Albumin 3.2 (*)    All other components within normal limits  URINALYSIS, W/ REFLEX TO CULTURE (INFECTION SUSPECTED) - Abnormal; Notable for the following components:   Color, Urine YELLOW (*)    APPearance CLEAR (*)     Glucose, UA >=500 (*)    All other components within normal limits  LACTIC ACID, PLASMA  CBC WITH DIFFERENTIAL/PLATELET  LIPID PANEL     EKG  None   RADIOLOGY I independently interpreted and visualized the CXR. My interpretation: No pneumonia Radiology interpretation:  IMPRESSION:  *No active cardiopulmonary disease.    PROCEDURES:  Critical Care performed: No   MEDICATIONS ORDERED IN ED: Medications - No data to display   IMPRESSION / MDM / ASSESSMENT AND PLAN / ED COURSE  I reviewed the triage vital signs and the nursing notes.                              Differential diagnosis includes, but is not limited to, poor circulation, infection  Patient's presentation is most consistent with acute presentation with potential threat to life or bodily function.  Patient presented to the emergency department today at the advice of his podiatrist because of concerns for poor healing amputation  site to his left foot.  On exam there is dehiscence and some necrotic tissue around the wound. Discussed with Dr. Chipper Herb with the hospitalist service who will evaluate for admission.   FINAL CLINICAL IMPRESSION(S) / ED DIAGNOSES   Final diagnoses:  Delayed wound healing     Note:  This document was prepared using Dragon voice recognition software and may include unintentional dictation errors.    Phineas Semen, MD 01/07/23 [phone]

## 2023-01-07 NOTE — ED Notes (Signed)
Patient assisted to use restroom at this time. Patient has unsteady gait and uses assistance from cane. This RN able to standby assist patient as needed.

## 2023-01-07 NOTE — ED Notes (Signed)
Patient brought to RM 38 and assisted into bed. Pt ambulatory with assistance. Pt placed in bed. Bed locked and in lowest position. Pt provided with call bell and educated not to get up without calling for assistance.

## 2023-01-08 DIAGNOSIS — L97509 Non-pressure chronic ulcer of other part of unspecified foot with unspecified severity: Secondary | ICD-10-CM

## 2023-01-08 DIAGNOSIS — E1152 Type 2 diabetes mellitus with diabetic peripheral angiopathy with gangrene: Secondary | ICD-10-CM

## 2023-01-08 DIAGNOSIS — E11621 Type 2 diabetes mellitus with foot ulcer: Secondary | ICD-10-CM | POA: Diagnosis not present

## 2023-01-08 DIAGNOSIS — I70222 Atherosclerosis of native arteries of extremities with rest pain, left leg: Secondary | ICD-10-CM

## 2023-01-08 LAB — GLUCOSE, CAPILLARY
Glucose-Capillary: 88 mg/dL (ref 70–99)
Glucose-Capillary: 42 mg/dL — CL (ref 70–99)
Glucose-Capillary: 40 mg/dL — CL (ref 70–99)
Glucose-Capillary: 76 mg/dL (ref 70–99)
Glucose-Capillary: 88 mg/dL (ref 70–99)
Glucose-Capillary: 167 mg/dL — ABNORMAL HIGH (ref 70–99)

## 2023-01-08 MED ORDER — ENOXAPARIN SODIUM 60 MG/0.6ML IJ SOSY
55.0000 mg | PREFILLED_SYRINGE | INTRAMUSCULAR | Status: DC
  Administered 2023-01-09 – 2023-01-13 (×6): 55 mg via SUBCUTANEOUS
  Filled 2023-01-08 (×6): qty 0.6

## 2023-01-08 MED ORDER — GLUCAGON HCL RDNA (DIAGNOSTIC) 1 MG IJ SOLR
INTRAMUSCULAR | Status: AC
  Filled 2023-01-08: qty 1

## 2023-01-08 MED ORDER — SODIUM CHLORIDE 0.9 % IV SOLN: INTRAVENOUS | Status: DC

## 2023-01-08 MED ORDER — DEXTROSE-SODIUM CHLORIDE 5-0.9 % IV SOLN: INTRAVENOUS | Status: DC

## 2023-01-08 MED ORDER — INSULIN GLARGINE-YFGN 100 UNIT/ML ~~LOC~~ SOLN
30.0000 [IU] | Freq: Every day | SUBCUTANEOUS | Status: DC
Start: 2023-01-08 — End: 2023-01-14
  Administered 2023-01-08 – 2023-01-13 (×6): 30 [IU] via SUBCUTANEOUS
  Filled 2023-01-08 (×7): qty 0.3

## 2023-01-08 MED ORDER — OXYCODONE HCL 5 MG PO TABS
20.0000 mg | ORAL_TABLET | Freq: Four times a day (QID) | ORAL | Status: DC | PRN
  Administered 2023-01-08 – 2023-01-14 (×11): 20 mg via ORAL
  Filled 2023-01-08 (×12): qty 4

## 2023-01-08 MED ORDER — DIPHENHYDRAMINE HCL 50 MG/ML IJ SOLN: 50.0000 mg | Freq: Once | INTRAMUSCULAR | Status: DC | PRN

## 2023-01-08 MED ORDER — CHLORHEXIDINE GLUCONATE CLOTH 2 % EX PADS: 6.0000 | MEDICATED_PAD | Freq: Once | CUTANEOUS | Status: DC

## 2023-01-08 MED ORDER — METHYLPREDNISOLONE SODIUM SUCC 125 MG IJ SOLR: 125.0000 mg | Freq: Once | INTRAMUSCULAR | Status: DC | PRN

## 2023-01-08 MED ORDER — CHLORHEXIDINE GLUCONATE CLOTH 2 % EX PADS
6.0000 | MEDICATED_PAD | Freq: Once | CUTANEOUS | Status: AC
  Administered 2023-01-08: 6 via TOPICAL

## 2023-01-08 MED ORDER — CEFAZOLIN SODIUM-DEXTROSE 2-4 GM/100ML-% IV SOLN
2.0000 g | INTRAVENOUS | Status: AC
  Administered 2023-01-09: 2 g via INTRAVENOUS

## 2023-01-08 MED ORDER — ACETAMINOPHEN 325 MG PO TABS: 650.0000 mg | ORAL_TABLET | Freq: Four times a day (QID) | ORAL | Status: DC | PRN

## 2023-01-08 MED ORDER — FAMOTIDINE 20 MG PO TABS: 40.0000 mg | ORAL_TABLET | Freq: Once | ORAL | Status: DC | PRN

## 2023-01-08 NOTE — Plan of Care (Signed)

## 2023-01-08 NOTE — Plan of Care (Signed)

## 2023-01-08 NOTE — Progress Notes (Signed)
[facility] Progress Note  Patient: Corey Foster    JXB:[id number]  DOA: 01/07/2023     Date of Service: the patient was seen and examined on 01/08/2023  Chief Complaint  Patient presents with   Foot Problem   Brief hospital course: BRAESON KETNER is a 67 y.o. 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   Patient was recently hospitalized for a left foot infection after the first toe amputation in early October.  Patient was sent home after IV antibiotics treatment, the initial plan was for patient to complete another 7 days of p.o. antibiotics.  According to the patient however the wound on left foot has not been healing well, the wound has had dehiscence and discoloration.  But he denied any active discharge from the wound or any pain.  Last Friday he went to see podiatry who recommended patient come to the hospital for podiatry intervention.  Patient however had a delay over the weekend due to some family issues and decided to come in today.  Denies any fever or chills. ED Course: WBC 7.8, hemoglobin 15.1, glucose 235 creatinine 1.2.    Assessment and Plan:  Chronic left diabetic foot infection with dry gangrene like changes -Poor prognosis, even after vascular surgeon's effort of revascularization on recent admission about 3 weeks ago. -Patient has been made aware about likelihood of first-ray and/toe partial foot amputation is expected by podiatry on recent clinic visit.  ED physician paged podiatry Dr. Excell Seltzer who requested that vascular surgery consult for possible BKA. -Clinically there is no significant signs of active infection but predominantly picture implying for dry gangrene like changes.  Will not escalate  antibiotics coverage. -Continue amoxicillin twice daily Patient will be n.p.o. after midnight Follow vascular surgery for Left BKA today  IDDM with hyperglycemia -Significant insulin resistant, continue long-acting insulin 80 mg twice daily and 3 times daily AC NovoLog 40 units.  Continue Jardiance -Add sliding scale   PVD with recent left tibial artery angioplasty -Continue aspirin -Check lipid panel   Cigarette smoking -Cessation education performed at bedside.  Patient appears to have no interest in quitting smoking -Nicotine patch   Body mass index is 34.71 kg/m.  Interventions:  Diet: NPO DVT Prophylaxis: Subcutaneous Lovenox   Advance goals of care discussion: Full code  Family Communication: family was not present at bedside, at the time of interview.  The pt provided permission to discuss medical plan with the family. Opportunity was given to ask question and all questions were answered satisfactorily.   Disposition:  Pt is from Home, admitted with Left Foot gangrene, needs left BKA, which precludes a safe discharge. Discharge to Home, when stable. DC plan TBD after PT/OT eval  Subjective: No significant events overnight, pain is well-controlled in the left foot.  Patient denies any discharge.  No any other complaints, resting comfortably.  Patient was asking when is he going for the surgery, informed that we do not know that the timing yet but vascular surgery will be rounding soon and will inform him.  Physical Exam: General: NAD, lying comfortably Appear in no distress, affect appropriate Eyes: PERRLA ENT: Oral Mucosa Clear, moist  Neck: no JVD,  Cardiovascular: S1 and S2 Present, no Murmur,  Respiratory: good respiratory effort, Bilateral Air entry equal  and Decreased, no Crackles, no wheezes Abdomen: Bowel Sound present, Soft and no tenderness,  Skin: no rashes Extremities: Left foot gangrene, s/p first ray amputation no Pedal edema, no calf  tenderness Neurologic: without any new focal findings Gait not checked due to patient safety concerns  Vitals:   01/07/23 2025 01/07/23 2259 01/08/23 0338 01/08/23 0726  BP: (!) 148/99 139/83 131/85 (!) 134/92  Pulse: (!) 105 97 94 89  Resp:  18 18 18   Temp:  98.4 F (36.9 C) 98.2 F (36.8 C) 97.8 F (36.6 C)  TempSrc:    Oral  SpO2: 100% 98% 99% 98%  Weight:      Height:       No intake or output data in the 24 hours ending 01/08/23 1047 Filed Weights   01/07/23 1512  Weight: 112.9 kg    Data Reviewed: I have personally reviewed and interpreted daily labs, tele strips, imagings as discussed above. I reviewed all nursing notes, pharmacy notes, vitals, pertinent old records I have discussed plan of care as described above with RN and patient/family.  CBC: Recent Labs  Lab 01/07/23 1638  WBC 7.8  NEUTROABS 5.5  HGB 15.1  HCT 44.6  MCV 94.1  PLT 263   Basic Metabolic Panel: Recent Labs  Lab 01/07/23 1638  NA 139  K 4.1  CL 106  CO2 24  GLUCOSE 235*  BUN 20  CREATININE 1.24  CALCIUM 8.7*    Studies: DG Chest 2 View  Result Date: 01/07/2023 CLINICAL DATA:  [id number] Diabetic retinopathy (HCC) 841324. Chest pain. Shortness of breath. EXAM: CHEST - 2 VIEW COMPARISON:  12/17/2022. FINDINGS: Bilateral lung fields are clear. Bilateral costophrenic angles are clear. Normal cardio-mediastinal silhouette. No acute osseous abnormalities. The soft tissues are within normal limits. IMPRESSION: *No active cardiopulmonary disease. Electronically Signed   By: Jules Schick M.D.   On: 01/07/2023 17:33    Scheduled Meds:  baclofen  10 mg Oral BID   empagliflozin  25 mg Oral Daily   enoxaparin (LOVENOX) injection  55 mg Subcutaneous Q24H   furosemide  20 mg Oral Daily   insulin aspart  0-15 Units Subcutaneous TID WC   insulin aspart  0-5 Units Subcutaneous QHS   insulin glargine-yfgn  40 Units Subcutaneous QHS   nicotine  21 mg Transdermal Daily   simvastatin  40 mg Oral  Daily   Continuous Infusions: PRN Meds: acetaminophen, ALPRAZolam, HYDROmorphone (DILAUDID) injection, ondansetron **OR** ondansetron (ZOFRAN) IV, oxyCODONE  Time spent: 35 minutes  Author: Gillis Santa. MD [facility] 01/08/2023 10:47 AM  To reach On-call, see care teams to locate the attending and reach out to them via [email or web address]. If 7PM-7AM, please contact night-coverage If you still have difficulty reaching the attending provider, please page the [facility] (Director on Call) for [facility] on amion for assistance.

## 2023-01-08 NOTE — Consult Note (Signed)
Hospital Consult    Reason for Consult:  Left Lower Extremity Non Healing Ulceration Requesting Physician:  Dr Rosetta Posner MD MRN #:  [id number]  History of Present Illness: This is a 67 y.o. male who presents to [facility] emergency department as advised by podiatry Dr. Rosetta Posner because of concerns for poor healing of her prior left first metatarsal amputation on 12/19/2022.  Patient has been following up with podiatry as scheduled.  Patient denies any significant pain.  He denies any chest pain shortness of breath fevers nausea vomiting or diarrhea.  Patient endorses today he would like to proceed with below-knee amputation rather than continue with any type of wound healing treatment.  Vascular surgery was consulted to evaluate left lower extremity for a left below the knee amputation.  Patient was last seen by vascular surgery on 12/18/2022 by Dr. [name] in which he underwent an aortogram with left lower extremity angiogram.  At that time he had transluminal angioplasty of both the left posterior tibial artery and the left peroneal artery.  Unfortunately the patient was unable to heal his prior left lower extremity wounds.  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  Past Medical History:  Diagnosis Date   Arthritis    Diabetes mellitus without complication (HCC)    DJD (degenerative joint disease)    GERD (gastroesophageal reflux disease)    Hyperlipidemia    Hypertension    Sciatic nerve pain    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   Sleep apnea     Past Surgical History:  Procedure Laterality Date   AMPUTATION Left 11/29/2022   Procedure: AMPUTATION RAY, PARTIAL 1ST RAY;  Surgeon: Rosetta Posner, DPM;  Location: [facility] ORS;  Service: Orthopedics/Podiatry;  Laterality: Left;   AMPUTATION Left 12/19/2022   Procedure: FIRST LEFT RAY AMPUTATION;  Surgeon: Rosetta Posner, DPM;  Location: [facility] ORS;  Service:  Orthopedics/Podiatry;  Laterality: Left;   CYSTOSCOPY W/ RETROGRADES Bilateral 05/31/2019   Procedure: CYSTOSCOPY WITH RETROGRADE PYELOGRAM;  Surgeon: Riki Altes, MD;  Location: [facility] OR[facility];  Service: Urology;  Laterality: Bilateral;   LOWER EXTREMITY ANGIOGRAPHY Left 11/28/2022   Procedure: Lower Extremity Angiography;  Surgeon: Annice Needy, MD;  Location: [facility] INVASIVE [facility];  Service: Cardiovascular;  Laterality: Left;   LOWER EXTREMITY ANGIOGRAPHY Left 12/18/2022   Procedure: Lower Extremity Angiography;  Surgeon: Annice Needy, MD;  Location: [facility] IN[facility];  Service: Cardiovascular;  Laterality: Left;   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 Authorizing Provider  albuterol (VENTOLIN HFA) 108 (90 Base) MCG/ACT inhaler Inhale 1 puff into the lungs every 6 (six) hours as needed for wheezing or shortness of breath.  Patient not taking: Reported on 12/17/2022 02/15/13   [provider]  ALPRAZolam Prudy Feeler) 1 MG tablet Take 1 mg by mouth 4 (four) times daily as needed. 08/27/19   [provider]  AMBULATORY NON FORMULARY MEDICATION Trimix (30/1/10)-(Pap/Phent/PGE)  Dosage: Inject 0.3 cc and may increase 0.1cc to achieve and erection lasting no longer than 1 hour per injection   Vial 1ml  Qty #5 refills 2  [facility] [phone] Fax [phone] 02/13/22   Riki Altes, MD  amoxicillin-clavulanate (AUGMENTIN) 875-125 MG tablet Take 1 tablet by mouth 2 (two) times daily. 12/20/22   Loyce Dys, MD  aspirin EC 325 MG tablet Take 325 mg by mouth daily.    [provider]  baclofen (LIORESAL)  10 MG tablet Take 10 mg by mouth 2 (two) times daily.  10/07/13   [provider]  cholecalciferol (VITAMIN D) 400 UNITS TABS tablet Take 10,000 Units by mouth daily.     [provider]  furosemide (LASIX) 20 MG tablet Take 20 mg by mouth daily. 01/15/21   [provider]  JARDIANCE 25 MG TABS tablet Take 25 mg by mouth daily. 06/21/21   [provider]  naloxone (NARCAN) 0.4 MG/ML injection Inject as directed as directed    [provider]  NOVOLOG FLEXPEN 100 UNIT/ML FlexPen Inject 40 Units into the skin 3 (three) times daily with meals. 11/29/22   Marrion Coy, MD  Nutritional Supplements (KETO PO) Take 1 capsule by mouth.    [provider]  Omega-3 Fatty Acids (FISH OIL) 1000 MG CAPS Take 1 capsule by mouth daily.  01/14/12   [provider]  Oxycodone HCl 20 MG TABS Take 20 mg by mouth 4 (four) times daily as needed (pain).  06/20/15   [provider]  sildenafil (REVATIO) 20 MG tablet TAKE 2 TO 5 TABLETS BY MOUTH 1 HOUR PRIOR TO INTERCOURSE 10/09/22   Stoioff, Verna Czech, MD  simvastatin (ZOCOR) 40 MG tablet Take 1 tablet (40 mg total) by mouth daily. 11/29/22   Marrion Coy, MD  Testosterone 20.25 MG/ACT (1.62%) GEL APPLY 4 APPLICATIONS TOPICALLY DAILY. 05/12/22   Stoioff, Verna Czech, MD  TOUJEO MAX SOLOSTAR 300 UNIT/ML Solostar Pen Inject 80-120 Units into the skin in the morning and at bedtime. Inject 80 Units into the skin daily. 80 units in the am and 120 units at bedtime    [provider]  TRULICITY 1.5 MG/0.5ML SOPN Inject 1.5 mg into the skin once a week. Patient not taking: Reported on 12/17/2022 01/16/21   [provider]  vitamin C (ASCORBIC ACID) 250 MG tablet Take 250 mg by mouth daily.    [provider]    Social History   Socioeconomic History   Marital status: Single    Spouse name: Not on file   Number of children: Not on file   Years of education: Not on file   Highest education  level: Not on file  Occupational History   Not on file  Tobacco Use   Smoking status: Every Day    Current packs/day: 0.25    Average packs/day: 0.3 packs/day for 40.0 years (10.0 ttl pk-yrs)    Types: Cigarettes   Smokeless tobacco: Never  Vaping Use   Vaping status: Never Used  Substance and Sexual Activity   Alcohol use: Yes    Alcohol/week: 18.0 standard drinks of alcohol    Types: 12 Cans of beer, 6 Shots of liquor per week   Drug use: Not Currently    Types: Cocaine, Marijuana   Sexual activity: Yes  Other Topics Concern   Not on file  Social History Narrative   Not on file   Social Determinants of Health   Financial Resource Strain: Not on file  Food Insecurity: No Food Insecurity (01/07/2023)   Hunger Vital Sign    Worried About Running Out of Food in the Last Year: Never  true    Ran Out of Food in the Last Year: Never true  Transportation Needs: No Transportation Needs (01/07/2023)   PRAPARE - Administrator, Civil Service (Medical): No    Lack of Transportation (Non-Medical): No  Physical Activity: Not on file  Stress: Not on file  Social Connections: Not on file  Intimate Partner Violence: Not At Risk (01/07/2023)   Humiliation, Afraid, Rape, and Kick questionnaire    Fear of Current or Ex-Partner: No    Emotionally Abused: No    Physically Abused: No    Sexually Abused: No     Family History  Problem Relation Age of Onset   Diabetes Mother    Heart disease Mother    Diabetes Father    Heart disease Father    Diabetes Brother     ROS: Otherwise negative unless mentioned in HPI  Physical Examination  Vitals:   01/07/23 2259 01/08/23 0338  BP: 139/83 131/85  Pulse: 97 94  Resp: 18 18  Temp: 98.4 F (36.9 C) 98.2 F (36.8 C)  SpO2: 98% 99%   Body mass index is 34.71 kg/m.  General:  WDWN in NAD Gait: Not observed HENT: WNL, normocephalic Pulmonary: normal non-labored breathing, without Rales, rhonchi,  wheezing Cardiac:  regular, without  Murmurs, rubs or gallops; without carotid bruits Abdomen: Positive bowel sounds throughout, soft, NT/ND, no masses Skin: without rashes Vascular Exam/Pulses: Palpable pulses right lower extremity.  Unable to palpate DP/PT pulses in the left lower extremity. Extremities: with ischemic changes, without Gangrene , without cellulitis; with open wounds;  Musculoskeletal: no muscle wasting or atrophy  Neurologic: A&O X 3;  No focal weakness or paresthesias are detected; speech is fluent/normal Psychiatric:  The pt has Normal affect. Lymph:  Unremarkable  CBC    Component Value Date/Time   WBC 7.8 01/07/2023 1638   RBC 4.74 01/07/2023 1638   HGB 15.1 01/07/2023 1638   HGB 16.4 08/02/2013 0916   HCT 44.6 01/07/2023 1638   HCT 47.1 03/27/2020 1012   PLT 263 01/07/2023 1638   PLT 205 08/02/2013 0916   MCV 94.1 01/07/2023 1638   MCV 94 08/02/2013 0916   MCH 31.9 01/07/2023 1638   MCHC 33.9 01/07/2023 1638   RDW 14.3 01/07/2023 1638   RDW 15.5 (H) 08/02/2013 0916   LYMPHSABS 1.4 01/07/2023 1638   LYMPHSABS 2.1 08/02/2013 0916   MONOABS 0.7 01/07/2023 1638   MONOABS 1.2 (H) 08/02/2013 0916   EOSABS 0.1 01/07/2023 1638   EOSABS 0.1 08/02/2013 0916   BASOSABS 0.0 01/07/2023 1638   BASOSABS 0.1 08/02/2013 0916    BMET    Component Value Date/Time   NA 139 01/07/2023 1638   K 4.1 01/07/2023 1638   CL 106 01/07/2023 1638   CO2 24 01/07/2023 1638   GLUCOSE 235 (H) 01/07/2023 1638   BUN 20 01/07/2023 1638   CREATININE 1.24 01/07/2023 1638   CALCIUM 8.7 (L) 01/07/2023 1638   GFRNONAA >60 01/07/2023 1638   GFRAA 44 (L) 04/15/2019 1432    COAGS: Lab Results  Component Value Date   INR 1.0 12/17/2022   INR 1.0 02/07/2021     Non-Invasive Vascular Imaging:   EXAM:12/17/22 LEFT FOOT - COMPLETE 3+ VIEW   COMPARISON:  12/17/2022   FINDINGS: Interval amputation of the first digit to the proximal metaphysis of the first metatarsal. Overall expected  postoperative appearance with overlying bandaging, and a small amount of residual postoperative gas along the soft tissues. No bony destructive  findings characteristic of osteomyelitis noted. Mild dorsal soft tissue swelling along the forefoot. Atheromatous vascular calcifications.   IMPRESSION: 1. Interval amputation of the first digit to the proximal metaphysis of the first metatarsal. Expected postoperative appearance. 2. Mild dorsal soft tissue swelling along the forefoot. 3. Atheromatous vascular calcifications.   Statin:  Yes.   Beta Blocker:  No. Aspirin:  No. ACEI:  No. ARB:  No. CCB use:  No Other antiplatelets/anticoagulants:  No.    ASSESSMENT/PLAN: This is a 66 y.o. male who presented to [facility] emergency department for continued concerns of nonhealing left lower extremity post left partial first ray amputation by Dr. Rosetta Posner on 12/19/2022.  Patient was informed on the last hospitalization that if this did not heal he would need a below the knee amputation.  Patient has agreed and would like to proceed with a below the knee amputation as soon as possible.  Vascular surgery plans on taking the patient to the operating room on 01/09/2023 for a left below the knee amputation.  I discussed in detail today with the patient the procedure, benefits, risks, and complications.  He verbalizes understanding.  He wishes to proceed as soon as possible.  I answered all the patient's questions today.  Patient will remain n.p.o. after midnight today for surgery tomorrow.   -I discussed the plan in detail with Dr. [name] and he agrees with the plan.   Marcie Bal [facility] 01/08/2023 7:21 AM

## 2023-01-08 NOTE — Progress Notes (Addendum)
Pt's blood sugar droped to 42. Tech gave him 2 cups apple juice. I started D51/2 NS. Rechecked bs, now 40. I gave 1 Glucagon shot and we will recheck at 1150. Pt is A&O x 4 but drowsy. Dr. Lucianne Muss notified.   D5NS ordered 73ml/hr. FSBS now 76. Resting in bed with eyes closed. No acute distress. Will continue to monitor and assess for s&s of hypoglycemia.

## 2023-01-08 NOTE — Plan of Care (Signed)
Problem: Education: Goal: Ability to describe self-care measures that may prevent or decrease complications (Diabetes Survival Skills Education) will improve 01/08/2023 1038 by Latanya Maudlin, RN Outcome: Progressing 01/08/2023 1035 by Latanya Maudlin, RN Outcome: Progressing Goal: Individualized Educational Video(s) 01/08/2023 1038 by Latanya Maudlin, RN Outcome: Progressing 01/08/2023 1035 by Latanya Maudlin, RN Outcome: Progressing   Problem: Coping: Goal: Ability to adjust to condition or change in health will improve 01/08/2023 1038 by Latanya Maudlin, RN Outcome: Progressing 01/08/2023 1035 by Latanya Maudlin, RN Outcome: Progressing   Problem: Fluid Volume: Goal: Ability to maintain a balanced intake and output will improve 01/08/2023 1038 by Latanya Maudlin, RN Outcome: Progressing 01/08/2023 1035 by Latanya Maudlin, RN Outcome: Progressing   Problem: Health Behavior/Discharge Planning: Goal: Ability to identify and utilize available resources and services will improve 01/08/2023 1038 by Latanya Maudlin, RN Outcome: Progressing 01/08/2023 1035 by Latanya Maudlin, RN Outcome: Progressing Goal: Ability to manage health-related needs will improve 01/08/2023 1038 by Latanya Maudlin, RN Outcome: Progressing 01/08/2023 1035 by Latanya Maudlin, RN Outcome: Progressing   Problem: Metabolic: Goal: Ability to maintain appropriate glucose levels will improve 01/08/2023 1038 by Latanya Maudlin, RN Outcome: Progressing 01/08/2023 1035 by Latanya Maudlin, RN Outcome: Progressing   Problem: Nutritional: Goal: Maintenance of adequate nutrition will improve 01/08/2023 1038 by Latanya Maudlin, RN Outcome: Progressing 01/08/2023 1035 by Latanya Maudlin, RN Outcome: Progressing Goal: Progress toward achieving an optimal weight will improve 01/08/2023 1038 by Latanya Maudlin, RN Outcome: Progressing 01/08/2023 1035 by Latanya Maudlin, RN Outcome:  Progressing   Problem: Skin Integrity: Goal: Risk for impaired skin integrity will decrease 01/08/2023 1038 by Latanya Maudlin, RN Outcome: Progressing 01/08/2023 1035 by Latanya Maudlin, RN Outcome: Progressing   Problem: Tissue Perfusion: Goal: Adequacy of tissue perfusion will improve 01/08/2023 1038 by Latanya Maudlin, RN Outcome: Progressing 01/08/2023 1035 by Latanya Maudlin, RN Outcome: Progressing   Problem: Education: Goal: Knowledge of General Education information will improve Description: Including pain rating scale, medication(s)/side effects and non-pharmacologic comfort measures 01/08/2023 1038 by Latanya Maudlin, RN Outcome: Progressing 01/08/2023 1035 by Latanya Maudlin, RN Outcome: Progressing   Problem: Health Behavior/Discharge Planning: Goal: Ability to manage health-related needs will improve 01/08/2023 1038 by Latanya Maudlin, RN Outcome: Progressing 01/08/2023 1035 by Latanya Maudlin, RN Outcome: Progressing   Problem: Clinical Measurements: Goal: Ability to maintain clinical measurements within normal limits will improve 01/08/2023 1038 by Latanya Maudlin, RN Outcome: Progressing 01/08/2023 1035 by Latanya Maudlin, RN Outcome: Progressing Goal: Will remain free from infection 01/08/2023 1038 by Latanya Maudlin, RN Outcome: Progressing 01/08/2023 1035 by Latanya Maudlin, RN Outcome: Progressing Goal: Diagnostic test results will improve 01/08/2023 1038 by Latanya Maudlin, RN Outcome: Progressing 01/08/2023 1035 by Latanya Maudlin, RN Outcome: Progressing Goal: Respiratory complications will improve 01/08/2023 1038 by Latanya Maudlin, RN Outcome: Progressing 01/08/2023 1035 by Latanya Maudlin, RN Outcome: Progressing Goal: Cardiovascular complication will be avoided 01/08/2023 1038 by Latanya Maudlin, RN Outcome: Progressing 01/08/2023 1035 by Latanya Maudlin, RN Outcome: Progressing   Problem: Activity: Goal: Risk  for activity intolerance will decrease 01/08/2023 1038 by Latanya Maudlin, RN Outcome: Progressing 01/08/2023 1035 by Latanya Maudlin, RN Outcome: Progressing   Problem: Nutrition: Goal: Adequate nutrition will be maintained 01/08/2023 1038 by Latanya Maudlin, RN Outcome: Progressing 01/08/2023 1035 by Latanya Maudlin, RN Outcome: Progressing  Problem: Coping: Goal: Level of anxiety will decrease 01/08/2023 1038 by Latanya Maudlin, RN Outcome: Progressing 01/08/2023 1035 by Latanya Maudlin, RN Outcome: Progressing   Problem: Elimination: Goal: Will not experience complications related to bowel motility 01/08/2023 1038 by Latanya Maudlin, RN Outcome: Progressing 01/08/2023 1035 by Latanya Maudlin, RN Outcome: Progressing Goal: Will not experience complications related to urinary retention 01/08/2023 1038 by Latanya Maudlin, RN Outcome: Progressing 01/08/2023 1035 by Latanya Maudlin, RN Outcome: Progressing   Problem: Pain Management: Goal: General experience of comfort will improve 01/08/2023 1038 by Latanya Maudlin, RN Outcome: Progressing 01/08/2023 1035 by Latanya Maudlin, RN Outcome: Progressing   Problem: Safety: Goal: Ability to remain free from injury will improve 01/08/2023 1038 by Latanya Maudlin, RN Outcome: Progressing 01/08/2023 1035 by Latanya Maudlin, RN Outcome: Progressing   Problem: Skin Integrity: Goal: Risk for impaired skin integrity will decrease 01/08/2023 1038 by Latanya Maudlin, RN Outcome: Progressing 01/08/2023 1035 by Latanya Maudlin, RN Outcome: Progressing

## 2023-01-08 NOTE — H&P (View-Only) (Signed)
 Hospital Consult    Reason for Consult:  Left Lower Extremity Non Healing Ulceration Requesting Physician:  Dr Rosetta Posner MD MRN #:  [id number]  History of Present Illness: This is a 67 y.o. male who presents to [facility] emergency department as advised by podiatry Dr. Rosetta Posner because of concerns for poor healing of her prior left first metatarsal amputation on 12/19/2022.  Patient has been following up with podiatry as scheduled.  Patient denies any significant pain.  He denies any chest pain shortness of breath fevers nausea vomiting or diarrhea.  Patient endorses today he would like to proceed with below-knee amputation rather than continue with any type of wound healing treatment.  Vascular surgery was consulted to evaluate left lower extremity for a left below the knee amputation.  Patient was last seen by vascular surgery on 12/18/2022 by Dr. [name] in which he underwent an aortogram with left lower extremity angiogram.  At that time he had transluminal angioplasty of both the left posterior tibial artery and the left peroneal artery.  Unfortunately the patient was unable to heal his prior left lower extremity wounds.  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  Past Medical History:  Diagnosis Date   Arthritis    Diabetes mellitus without complication (HCC)    DJD (degenerative joint disease)    GERD (gastroesophageal reflux disease)    Hyperlipidemia    Hypertension    Sciatic nerve pain    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   Sleep apnea     Past Surgical History:  Procedure Laterality Date   AMPUTATION Left 11/29/2022   Procedure: AMPUTATION RAY, PARTIAL 1ST RAY;  Surgeon: Rosetta Posner, DPM;  Location: [facility] ORS;  Service: Orthopedics/Podiatry;  Laterality: Left;   AMPUTATION Left 12/19/2022   Procedure: FIRST LEFT RAY AMPUTATION;  Surgeon: Rosetta Posner, DPM;  Location: [facility] ORS;  Service:  Orthopedics/Podiatry;  Laterality: Left;   CYSTOSCOPY W/ RETROGRADES Bilateral 05/31/2019   Procedure: CYSTOSCOPY WITH RETROGRADE PYELOGRAM;  Surgeon: Riki Altes, MD;  Location: [facility] ORS;  Service: Urology;  Laterality: Bilateral;   LOWER EXTREMITY ANGIOGRAPHY Left 11/28/2022   Procedure: Lower Extremity Angiography;  Surgeon: Annice Needy, MD;  Location: [facility] INVASIVE CV LAB;  Service: Cardiovascular;  Laterality: Left;   LOWER EXTREMITY ANGIOGRAPHY Left 12/18/2022   Procedure: Lower Extremity Angiography;  Surgeon: Annice Needy, MD;  Location: [facility] INVASIVE CV LAB;  Service: Cardiovascular;  Laterality: Left;   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 Authorizing Provider  albuterol (VENTOLIN HFA) 108 (90 Base) MCG/ACT inhaler Inhale 1 puff into the lungs every 6 (six) hours as needed for wheezing or shortness of breath.  Patient not taking: Reported on 12/17/2022 02/15/13   [provider]  ALPRAZolam Prudy Feeler) 1 MG tablet Take 1 mg by mouth 4 (four) times daily as needed. 08/27/19   [provider]  AMBULATORY NON FORMULARY MEDICATION Trimix (30/1/10)-(Pap/Phent/PGE)  Dosage: Inject 0.3 cc and may increase 0.1cc to achieve and erection lasting no longer than 1 hour per injection   Vial 1ml  Qty #5 refills 2  [facility] [phone] Fax [phone] 02/13/22   Riki Altes, MD  amoxicillin-clavulanate (AUGMENTIN) 875-125 MG tablet Take 1 tablet by mouth 2 (two) times daily. 12/20/22   Loyce Dys, MD  aspirin EC 325 MG tablet Take 325 mg by mouth daily.    [provider]  baclofen (LIORESAL)  10 MG tablet Take 10 mg by mouth 2 (two) times daily.  10/07/13   [provider]  cholecalciferol (VITAMIN D) 400 UNITS TABS tablet Take 10,000 Units by mouth daily.     [provider]  furosemide (LASIX) 20 MG tablet Take 20 mg by mouth daily. 01/15/21   [provider]  JARDIANCE 25 MG TABS tablet Take 25 mg by mouth daily. 06/21/21   [provider]  naloxone (NARCAN) 0.4 MG/ML injection Inject as directed as directed    [provider]  NOVOLOG FLEXPEN 100 UNIT/ML FlexPen Inject 40 Units into the skin 3 (three) times daily with meals. 11/29/22   Marrion Coy, MD  Nutritional Supplements (KETO PO) Take 1 capsule by mouth.    [provider]  Omega-3 Fatty Acids (FISH OIL) 1000 MG CAPS Take 1 capsule by mouth daily.  01/14/12   [provider]  Oxycodone HCl 20 MG TABS Take 20 mg by mouth 4 (four) times daily as needed (pain).  06/20/15   [provider]  sildenafil (REVATIO) 20 MG tablet TAKE 2 TO 5 TABLETS BY MOUTH 1 HOUR PRIOR TO INTERCOURSE 10/09/22   Stoioff, Verna Czech, MD  simvastatin (ZOCOR) 40 MG tablet Take 1 tablet (40 mg total) by mouth daily. 11/29/22   Marrion Coy, MD  Testosterone 20.25 MG/ACT (1.62%) GEL APPLY 4 APPLICATIONS TOPICALLY DAILY. 05/12/22   Stoioff, Verna Czech, MD  TOUJEO MAX SOLOSTAR 300 UNIT/ML Solostar Pen Inject 80-120 Units into the skin in the morning and at bedtime. Inject 80 Units into the skin daily. 80 units in the am and 120 units at bedtime    [provider]  TRULICITY 1.5 MG/0.5ML SOPN Inject 1.5 mg into the skin once a week. Patient not taking: Reported on 12/17/2022 01/16/21   [provider]  vitamin C (ASCORBIC ACID) 250 MG tablet Take 250 mg by mouth daily.    [provider]    Social History   Socioeconomic History   Marital status: Single    Spouse name: Not on file   Number of children: Not on file   Years of education: Not on file   Highest education  level: Not on file  Occupational History   Not on file  Tobacco Use   Smoking status: Every Day    Current packs/day: 0.25    Average packs/day: 0.3 packs/day for 40.0 years (10.0 ttl pk-yrs)    Types: Cigarettes   Smokeless tobacco: Never  Vaping Use   Vaping status: Never Used  Substance and Sexual Activity   Alcohol use: Yes    Alcohol/week: 18.0 standard drinks of alcohol    Types: 12 Cans of beer, 6 Shots of liquor per week   Drug use: Not Currently    Types: Cocaine, Marijuana   Sexual activity: Yes  Other Topics Concern   Not on file  Social History Narrative   Not on file   Social Determinants of Health   Financial Resource Strain: Not on file  Food Insecurity: No Food Insecurity (01/07/2023)   Hunger Vital Sign    Worried About Running Out of Food in the Last Year: Never  true    Ran Out of Food in the Last Year: Never true  Transportation Needs: No Transportation Needs (01/07/2023)   PRAPARE - Administrator, Civil Service (Medical): No    Lack of Transportation (Non-Medical): No  Physical Activity: Not on file  Stress: Not on file  Social Connections: Not on file  Intimate Partner Violence: Not At Risk (01/07/2023)   Humiliation, Afraid, Rape, and Kick questionnaire    Fear of Current or Ex-Partner: No    Emotionally Abused: No    Physically Abused: No    Sexually Abused: No     Family History  Problem Relation Age of Onset   Diabetes Mother    Heart disease Mother    Diabetes Father    Heart disease Father    Diabetes Brother     ROS: Otherwise negative unless mentioned in HPI  Physical Examination  Vitals:   01/07/23 2259 01/08/23 0338  BP: 139/83 131/85  Pulse: 97 94  Resp: 18 18  Temp: 98.4 F (36.9 C) 98.2 F (36.8 C)  SpO2: 98% 99%   Body mass index is 34.71 kg/m.  General:  WDWN in NAD Gait: Not observed HENT: WNL, normocephalic Pulmonary: normal non-labored breathing, without Rales, rhonchi,  wheezing Cardiac:  regular, without  Murmurs, rubs or gallops; without carotid bruits Abdomen: Positive bowel sounds throughout, soft, NT/ND, no masses Skin: without rashes Vascular Exam/Pulses: Palpable pulses right lower extremity.  Unable to palpate DP/PT pulses in the left lower extremity. Extremities: with ischemic changes, without Gangrene , without cellulitis; with open wounds;  Musculoskeletal: no muscle wasting or atrophy  Neurologic: A&O X 3;  No focal weakness or paresthesias are detected; speech is fluent/normal Psychiatric:  The pt has Normal affect. Lymph:  Unremarkable  CBC    Component Value Date/Time   WBC 7.8 01/07/2023 1638   RBC 4.74 01/07/2023 1638   HGB 15.1 01/07/2023 1638   HGB 16.4 08/02/2013 0916   HCT 44.6 01/07/2023 1638   HCT 47.1 03/27/2020 1012   PLT 263 01/07/2023 1638   PLT 205 08/02/2013 0916   MCV 94.1 01/07/2023 1638   MCV 94 08/02/2013 0916   MCH 31.9 01/07/2023 1638   MCHC 33.9 01/07/2023 1638   RDW 14.3 01/07/2023 1638   RDW 15.5 (H) 08/02/2013 0916   LYMPHSABS 1.4 01/07/2023 1638   LYMPHSABS 2.1 08/02/2013 0916   MONOABS 0.7 01/07/2023 1638   MONOABS 1.2 (H) 08/02/2013 0916   EOSABS 0.1 01/07/2023 1638   EOSABS 0.1 08/02/2013 0916   BASOSABS 0.0 01/07/2023 1638   BASOSABS 0.1 08/02/2013 0916    BMET    Component Value Date/Time   NA 139 01/07/2023 1638   K 4.1 01/07/2023 1638   CL 106 01/07/2023 1638   CO2 24 01/07/2023 1638   GLUCOSE 235 (H) 01/07/2023 1638   BUN 20 01/07/2023 1638   CREATININE 1.24 01/07/2023 1638   CALCIUM 8.7 (L) 01/07/2023 1638   GFRNONAA >60 01/07/2023 1638   GFRAA 44 (L) 04/15/2019 1432    COAGS: Lab Results  Component Value Date   INR 1.0 12/17/2022   INR 1.0 02/07/2021     Non-Invasive Vascular Imaging:   EXAM:12/17/22 LEFT FOOT - COMPLETE 3+ VIEW   COMPARISON:  12/17/2022   FINDINGS: Interval amputation of the first digit to the proximal metaphysis of the first metatarsal. Overall expected  postoperative appearance with overlying bandaging, and a small amount of residual postoperative gas along the soft tissues. No bony destructive  findings characteristic of osteomyelitis noted. Mild dorsal soft tissue swelling along the forefoot. Atheromatous vascular calcifications.   IMPRESSION: 1. Interval amputation of the first digit to the proximal metaphysis of the first metatarsal. Expected postoperative appearance. 2. Mild dorsal soft tissue swelling along the forefoot. 3. Atheromatous vascular calcifications.   Statin:  Yes.   Beta Blocker:  No. Aspirin:  No. ACEI:  No. ARB:  No. CCB use:  No Other antiplatelets/anticoagulants:  No.    ASSESSMENT/PLAN: This is a 66 y.o. male who presented to [facility] emergency department for continued concerns of nonhealing left lower extremity post left partial first ray amputation by Dr. Rosetta Posner on 12/19/2022.  Patient was informed on the last hospitalization that if this did not heal he would need a below the knee amputation.  Patient has agreed and would like to proceed with a below the knee amputation as soon as possible.  Vascular surgery plans on taking the patient to the operating room on 01/09/2023 for a left below the knee amputation.  I discussed in detail today with the patient the procedure, benefits, risks, and complications.  He verbalizes understanding.  He wishes to proceed as soon as possible.  I answered all the patient's questions today.  Patient will remain n.p.o. after midnight today for surgery tomorrow.   -I discussed the plan in detail with Dr. [name] and he agrees with the plan.   Marcie Bal [facility] 01/08/2023 7:21 AM

## 2023-01-09 ENCOUNTER — Inpatient Hospital Stay: Payer: Medicare HMO | Admitting: Anesthesiology

## 2023-01-09 ENCOUNTER — Encounter: Payer: Self-pay | Admitting: Internal Medicine

## 2023-01-09 ENCOUNTER — Other Ambulatory Visit: Payer: Self-pay

## 2023-01-09 ENCOUNTER — Encounter
Admission: EM | Disposition: A | Discharge status: Skilled Nursing Facility | Payer: Self-pay | Source: Home / Self Care | Attending: Student

## 2023-01-09 ENCOUNTER — Inpatient Hospital Stay: Payer: Medicare HMO

## 2023-01-09 DIAGNOSIS — E11621 Type 2 diabetes mellitus with foot ulcer: Secondary | ICD-10-CM | POA: Diagnosis not present

## 2023-01-09 DIAGNOSIS — L97509 Non-pressure chronic ulcer of other part of unspecified foot with unspecified severity: Secondary | ICD-10-CM | POA: Diagnosis not present

## 2023-01-09 DIAGNOSIS — E1152 Type 2 diabetes mellitus with diabetic peripheral angiopathy with gangrene: Secondary | ICD-10-CM | POA: Diagnosis not present

## 2023-01-09 DIAGNOSIS — I96 Gangrene, not elsewhere classified: Secondary | ICD-10-CM

## 2023-01-09 HISTORY — PX: AMPUTATION: SHX166

## 2023-01-09 LAB — PHOSPHORUS: Phosphorus: 3.3 mg/dL (ref 2.5–4.6)

## 2023-01-09 LAB — GLUCOSE, CAPILLARY
Glucose-Capillary: 42 mg/dL — CL (ref 70–99)
Glucose-Capillary: 48 mg/dL — ABNORMAL LOW (ref 70–99)
Glucose-Capillary: 124 mg/dL — ABNORMAL HIGH (ref 70–99)
Glucose-Capillary: 89 mg/dL (ref 70–99)
Glucose-Capillary: 113 mg/dL — ABNORMAL HIGH (ref 70–99)
Glucose-Capillary: 158 mg/dL — ABNORMAL HIGH (ref 70–99)
Glucose-Capillary: 196 mg/dL — ABNORMAL HIGH (ref 70–99)

## 2023-01-09 LAB — CBC
HCT: 44.2 % (ref 39.0–52.0)
Hemoglobin: 15 g/dL (ref 13.0–17.0)
MCH: 31.8 pg (ref 26.0–34.0)
MCHC: 33.9 g/dL (ref 30.0–36.0)
MCV: 93.6 fL (ref 80.0–100.0)
Platelets: 271 10*3/uL (ref 150–400)
RBC: 4.72 MIL/uL (ref 4.22–5.81)
RDW: 14.3 % (ref 11.5–15.5)
WBC: 8.1 10*3/uL (ref 4.0–10.5)
nRBC: 0 % (ref 0.0–0.2)

## 2023-01-09 LAB — BASIC METABOLIC PANEL
Anion gap: 9 (ref 5–15)
BUN: 13 mg/dL (ref 8–23)
CO2: 23 mmol/L (ref 22–32)
Calcium: 8.5 mg/dL — ABNORMAL LOW (ref 8.9–10.3)
Chloride: 106 mmol/L (ref 98–111)
Creatinine, Ser: 0.9 mg/dL (ref 0.61–1.24)
GFR, Estimated: >60 mL/min (ref >60)
Glucose, Bld: 51 mg/dL — ABNORMAL LOW (ref 70–99)
Potassium: 4.3 mmol/L (ref 3.5–5.1)
Sodium: 138 mmol/L (ref 135–145)

## 2023-01-09 LAB — MAGNESIUM: Magnesium: 2.4 mg/dL (ref 1.7–2.4)

## 2023-01-09 SURGERY — AMPUTATION BELOW KNEE
Anesthesia: Regional | Site: Knee | Laterality: Left

## 2023-01-09 MED ORDER — CEFAZOLIN SODIUM-DEXTROSE 2-4 GM/100ML-% IV SOLN
INTRAVENOUS | Status: AC
  Filled 2023-01-09: qty 100

## 2023-01-09 MED ORDER — PHENYLEPHRINE HCL (PRESSORS) 10 MG/ML IV SOLN
INTRAVENOUS | Status: AC
Start: 2023-01-09 — End: ?
  Filled 2023-01-09: qty 1

## 2023-01-09 MED ORDER — ACETAMINOPHEN 10 MG/ML IV SOLN
1000.0000 mg | Freq: Once | INTRAVENOUS | Status: DC | PRN
Start: 2023-01-09 — End: 2023-01-09

## 2023-01-09 MED ORDER — PROPOFOL 10 MG/ML IV BOLUS
INTRAVENOUS | Status: AC
  Filled 2023-01-09: qty 20

## 2023-01-09 MED ORDER — PHENYLEPHRINE 80 MCG/ML (10ML) SYRINGE FOR IV PUSH (FOR BLOOD PRESSURE SUPPORT)
PREFILLED_SYRINGE | INTRAVENOUS | Status: DC | PRN
  Administered 2023-01-09 (×4): 120 ug via INTRAVENOUS

## 2023-01-09 MED ORDER — PHENYLEPHRINE HCL-NACL 20-0.9 MG/250ML-% IV SOLN
INTRAVENOUS | Status: AC
Start: 2023-01-09 — End: ?
  Filled 2023-01-09: qty 250

## 2023-01-09 MED ORDER — PROPOFOL 1000 MG/100ML IV EMUL
INTRAVENOUS | Status: AC
Start: 2023-01-09 — End: ?
  Filled 2023-01-09: qty 100

## 2023-01-09 MED ORDER — IPRATROPIUM-ALBUTEROL 0.5-2.5 (3) MG/3ML IN SOLN: 3.0000 mL | RESPIRATORY_TRACT | Status: DC | PRN

## 2023-01-09 MED ORDER — BUPIVACAINE LIPOSOME 1.3 % IJ SUSP
INTRAMUSCULAR | Status: DC | PRN
  Administered 2023-01-09 (×2): 10 mL via PERINEURAL

## 2023-01-09 MED ORDER — SENNOSIDES-DOCUSATE SODIUM 8.6-50 MG PO TABS: 1.0000 | ORAL_TABLET | Freq: Every evening | ORAL | Status: DC | PRN

## 2023-01-09 MED ORDER — BUPIVACAINE LIPOSOME 1.3 % IJ SUSP
INTRAMUSCULAR | Status: AC
  Filled 2023-01-09: qty 20

## 2023-01-09 MED ORDER — OXYCODONE HCL 5 MG/5ML PO SOLN: 5.0000 mg | Freq: Once | ORAL | Status: DC | PRN

## 2023-01-09 MED ORDER — PHENYLEPHRINE HCL-NACL 20-0.9 MG/250ML-% IV SOLN
INTRAVENOUS | Status: DC | PRN
  Administered 2023-01-09: 40 ug/min via INTRAVENOUS

## 2023-01-09 MED ORDER — 0.9 % SODIUM CHLORIDE (POUR BTL) OPTIME
TOPICAL | Status: DC | PRN
  Administered 2023-01-09: 1000 mL

## 2023-01-09 MED ORDER — BUPIVACAINE HCL (PF) 0.5 % IJ SOLN
INTRAMUSCULAR | Status: AC
  Filled 2023-01-09: qty 20

## 2023-01-09 MED ORDER — FENTANYL CITRATE (PF) 100 MCG/2ML IJ SOLN: 25.0000 ug | INTRAMUSCULAR | Status: DC | PRN

## 2023-01-09 MED ORDER — MIDAZOLAM HCL 2 MG/2ML IJ SOLN
INTRAMUSCULAR | Status: AC
  Filled 2023-01-09: qty 2

## 2023-01-09 MED ORDER — OXYCODONE HCL 5 MG PO TABS: 5.0000 mg | ORAL_TABLET | Freq: Once | ORAL | Status: DC | PRN

## 2023-01-09 MED ORDER — DEXTROSE 50 % IV SOLN
INTRAVENOUS | Status: AC
  Filled 2023-01-09: qty 50

## 2023-01-09 MED ORDER — BUPIVACAINE HCL (PF) 0.5 % IJ SOLN
INTRAMUSCULAR | Status: DC | PRN
  Administered 2023-01-09 (×2): 10 mL via PERINEURAL

## 2023-01-09 MED ORDER — FENTANYL CITRATE PF 50 MCG/ML IJ SOSY
PREFILLED_SYRINGE | INTRAMUSCULAR | Status: AC
  Filled 2023-01-09: qty 1

## 2023-01-09 MED ORDER — METOPROLOL TARTRATE 5 MG/5ML IV SOLN
5.0000 mg | INTRAVENOUS | Status: DC | PRN
Start: 2023-01-09 — End: 2023-01-14

## 2023-01-09 MED ORDER — MIDAZOLAM HCL 2 MG/2ML IJ SOLN
INTRAMUSCULAR | Status: DC | PRN
  Administered 2023-01-09: 2 mg via INTRAVENOUS

## 2023-01-09 MED ORDER — PROPOFOL 10 MG/ML IV BOLUS
INTRAVENOUS | Status: DC | PRN
  Administered 2023-01-09: 150 ug/kg/min via INTRAVENOUS

## 2023-01-09 MED ORDER — HYDRALAZINE HCL 20 MG/ML IJ SOLN
10.0000 mg | INTRAMUSCULAR | Status: DC | PRN
Start: 2023-01-09 — End: 2023-01-14

## 2023-01-09 MED ORDER — SUCCINYLCHOLINE CHLORIDE 200 MG/10ML IV SOSY
PREFILLED_SYRINGE | INTRAVENOUS | Status: AC
  Filled 2023-01-09: qty 10

## 2023-01-09 MED ORDER — LIDOCAINE HCL (PF) 2 % IJ SOLN
INTRAMUSCULAR | Status: AC
  Filled 2023-01-09: qty 5

## 2023-01-09 MED ORDER — DROPERIDOL 2.5 MG/ML IJ SOLN: 0.6250 mg | Freq: Once | INTRAMUSCULAR | Status: DC | PRN

## 2023-01-09 MED ORDER — FENTANYL CITRATE (PF) 100 MCG/2ML IJ SOLN
INTRAMUSCULAR | Status: DC | PRN
  Administered 2023-01-09: 50 ug via INTRAVENOUS

## 2023-01-09 MED ORDER — FENTANYL CITRATE (PF) 100 MCG/2ML IJ SOLN
INTRAMUSCULAR | Status: AC
  Filled 2023-01-09: qty 2

## 2023-01-09 MED ORDER — DEXTROSE 50 % IV SOLN
50.0000 mL | Freq: Once | INTRAVENOUS | Status: AC
  Administered 2023-01-09: 50 mL via INTRAVENOUS

## 2023-01-09 SURGICAL SUPPLY — 37 items
BLADE SAGITTAL WIDE XTHICK NO (BLADE) IMPLANT
BLADE SAW SAG 25.4X90 (BLADE) ×2 IMPLANT
BNDG COHESIVE 4X5 TAN STRL LF (GAUZE/BANDAGES/DRESSINGS) ×2 IMPLANT
BNDG ELASTIC 6X5.8 VLCR NS LF (GAUZE/BANDAGES/DRESSINGS) ×2 IMPLANT
BNDG GAUZE DERMACEA FLUFF 4 (GAUZE/BANDAGES/DRESSINGS) ×4 IMPLANT
BRUSH SCRUB EZ 4% CHG (MISCELLANEOUS) ×2 IMPLANT
CHLORAPREP W/TINT 26 (MISCELLANEOUS) ×2 IMPLANT
DRAPE INCISE IOBAN 66X45 STRL (DRAPES) IMPLANT
ELECT CAUTERY BLADE 6.4 (BLADE) ×2 IMPLANT
ELECT REM PT RETURN 9FT ADLT (ELECTROSURGICAL) ×1
ELECTRODE REM PT RTRN 9FT ADLT (ELECTROSURGICAL) ×2 IMPLANT
GAUZE XEROFORM 1X8 LF (GAUZE/BANDAGES/DRESSINGS) ×4 IMPLANT
GLOVE BIO SURGEON STRL SZ7 (GLOVE) ×4 IMPLANT
GOWN STRL REUS W/ TWL LRG LVL3 (GOWN DISPOSABLE) ×4 IMPLANT
GOWN STRL REUS W/TWL 2XL LVL3 (GOWN DISPOSABLE) ×2 IMPLANT
GOWN STRL REUS W/TWL LRG LVL3 (GOWN DISPOSABLE) ×2
HANDLE YANKAUER SUCT BULB TIP (MISCELLANEOUS) ×2 IMPLANT
KIT TURNOVER KIT A (KITS) ×2 IMPLANT
LABEL OR SOLS (LABEL) ×2 IMPLANT
MANIFOLD NEPTUNE II (INSTRUMENTS) ×2 IMPLANT
MAT ABSORB FLUID 56X50 GRAY (MISCELLANEOUS) ×2 IMPLANT
NS IRRIG 1000ML POUR BTL (IV SOLUTION) ×2 IMPLANT
PACK EXTREMITY ARMC (MISCELLANEOUS) ×2 IMPLANT
PAD ABD DERMACEA PRESS 5X9 (GAUZE/BANDAGES/DRESSINGS) ×4 IMPLANT
PAD PREP OB/GYN DISP 24X41 (PERSONAL CARE ITEMS) ×2 IMPLANT
SPONGE T-LAP 18X18 ~~LOC~~+RFID (SPONGE) ×2 IMPLANT
STAPLER SKIN PROX 35W (STAPLE) ×2 IMPLANT
STOCKINETTE M/LG 89821 (MISCELLANEOUS) ×2 IMPLANT
SUT SILK 2 0 (SUTURE) ×1
SUT SILK 2 0 SH (SUTURE) ×4 IMPLANT
SUT SILK 2-0 18XBRD TIE 12 (SUTURE) ×2 IMPLANT
SUT SILK 3 0 (SUTURE) ×1
SUT SILK 3-0 18XBRD TIE 12 (SUTURE) ×2 IMPLANT
SUT VIC AB 0 CT1 36 (SUTURE) ×4 IMPLANT
SUT VIC AB 2-0 CT1 (SUTURE) ×4 IMPLANT
TRAP FLUID SMOKE EVACUATOR (MISCELLANEOUS) ×2 IMPLANT
WATER STERILE IRR 500ML POUR (IV SOLUTION) ×2 IMPLANT

## 2023-01-09 NOTE — Anesthesia Postprocedure Evaluation (Signed)
Anesthesia Post Note  Patient: Corey Foster  Procedure(s) Performed: AMPUTATION BELOW KNEE (Left: Knee)  Patient location during evaluation: PACU Anesthesia Type: Regional Level of consciousness: awake and alert Pain management: pain level controlled Vital Signs Assessment: post-procedure vital signs reviewed and stable Respiratory status: spontaneous breathing, nonlabored ventilation and respiratory function stable Cardiovascular status: blood pressure returned to baseline and stable Postop Assessment: no apparent nausea or vomiting Anesthetic complications: no   No notable events documented.   Last Vitals:  Vitals:   01/09/23 1030 01/09/23 1054  BP: (!) 146/96 (!) 161/90  Pulse: 92 89  Resp: (!) 23 16  Temp: 36.8 C 36.7 C  SpO2: 99% 100%    Last Pain:  Vitals:   01/09/23 1030  TempSrc:   PainSc: 0-No pain                 Foye Deer

## 2023-01-09 NOTE — Inpatient Diabetes Management (Signed)
Inpatient Diabetes Program Recommendations  AACE/ADA: New Consensus Statement on Inpatient Glycemic Control (2015)  Target Ranges:  Prepandial:   less than 140 mg/dL      Peak postprandial:   less than 180 mg/dL (1-2 hours)      Critically ill patients:  140 - 180 mg/dL    Latest Reference Range & Units 11/27/22 13:49  Hemoglobin A1C 4.8 - 5.6 % >15.5 (H)  (H): Data is abnormally high  Latest Reference Range & Units 01/07/23 20:40  Glucose-Capillary 70 - 99 mg/dL 191 (H)  2 units Novolog  40 units Semglee  (H): Data is abnormally high  Latest Reference Range & Units 01/08/23 07:28 01/08/23 11:17 01/08/23 11:32 01/08/23 11:50 01/08/23 16:32 01/08/23 20:53  Glucose-Capillary 70 - 99 mg/dL 88 42 (LL) 40 (LL) 76 88 167 (H)  3 units Novolog  30 units Semglee  (LL): Data is critically low (H): Data is abnormally high  Latest Reference Range & Units 01/09/23 07:12 01/09/23 07:13 01/09/23 07:34  Glucose-Capillary 70 - 99 mg/dL 48 (L) 42 (LL) 478 (H)  (LL): Data is critically low (L): Data is abnormally low (H): Data is abnormally high    Admit with: 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 Reduce Semglee to 20 units at bedtime  2. Stop the HS bedtime SSI scale (use SSI only at meal times for now)     --Will follow patient during hospitalization--  Ambrose Finland RN, MSN, CDCES Diabetes Coordinator Inpatient  Glycemic Control Team Team Pager: [phone] (8a-5p)

## 2023-01-09 NOTE — Plan of Care (Signed)

## 2023-01-09 NOTE — Evaluation (Signed)
Physical Therapy Evaluation Patient Details Name: Corey Foster MRN: [id number] DOB: May 25, 1955 Today's Date: 01/09/2023  History of Present Illness  Pt is a 67 y.o. 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   Clinical Impression  Pt A&Ox4, exhibited mild/moderate pain signs/symptoms with mobility. Reported at baseline he is independent, but does have a SPC and that his daughter is planning to come stay with him for a bit at discharge. Overall the patient moved very well. modI for bed mobility, and with set up cues and CGA he was able to laterally scoot into the recliner. Also able to sit <> stand from recliner with RW and CGA for pericare in standing (maxA). Pt educated on limb positioning, HEP packet administered, and DME discussed. The patient demonstrated deficits (see "PT Problem List") that impede the patient's functional abilities, safety, and mobility and would benefit from skilled PT intervention.        If plan is discharge home, recommend the following: A little help with walking and/or transfers;A little help with bathing/dressing/bathroom;Assistance with cooking/housework;Assist for transportation;Help with stairs or ramp for entrance;Assistance with feeding;Direct supervision/assist for medications management   Can travel by private vehicle        Equipment Recommendations Wheelchair (measurements PT);Wheelchair cushion (measurements PT);BSC/3in1 (DROP ARM BARIATRIC BSC)  Recommendations for Other Services       Functional Status Assessment Patient has had a recent decline in their functional status and  demonstrates the ability to make significant improvements in function in a reasonable and predictable amount of time.     Precautions / Restrictions Precautions Precautions: Fall Restrictions Weight Bearing Restrictions: Yes LLE Weight Bearing: Non weight bearing      Mobility  Bed Mobility Overal bed mobility: Modified Independent                  Transfers Overall transfer level: Needs assistance Equipment used: Rolling walker (2 wheels) Transfers: Sit to/from Stand, Bed to chair/wheelchair/BSC Sit to Stand: Contact guard assist          Lateral/Scoot Transfers: Contact guard assist      Ambulation/Gait                  Stairs            Wheelchair Mobility     Tilt Bed    Modified Rankin (Stroke Patients Only)       Balance Overall balance assessment: Needs assistance Sitting-balance support: Feet supported, No upper extremity supported Sitting balance-Leahy Scale: Normal     Standing balance support: Bilateral upper extremity supported, Reliant on assistive device for balance Standing balance-Leahy Scale: Good                               Pertinent Vitals/Pain Pain Assessment Pain Assessment: Faces Faces Pain Scale: Hurts even more Pain Location: L residual limb Pain Descriptors / Indicators: Discomfort, Grimacing, Aching Pain Intervention(s): Limited activity within patient's tolerance, Monitored during session, Repositioned, Patient requesting pain meds-RN notified    Home Living Family/patient expects to be discharged to:: Private residence Living Arrangements: Children Available Help at Discharge: Family;Available 24 hours/day Type  of Home: Apartment Home Access: Level entry       Home Layout: One level Home Equipment: Agricultural consultant (2 wheels);Cane - single point      Prior Function Prior Level of Function : Needs assist             Mobility Comments: MOD I using SPC       Extremity/Trunk  Assessment   Upper Extremity Assessment Upper Extremity Assessment: Overall WFL for tasks assessed    Lower Extremity Assessment Lower Extremity Assessment: Overall WFL for tasks assessed (able to move residual limb well, noted for good TKE on LLE as well)       Communication   Communication Communication: No apparent difficulties  Cognition Arousal: Alert Behavior During Therapy: WFL for tasks assessed/performed Overall Cognitive Status: Within Functional Limits for tasks assessed                                          General Comments      Exercises     Assessment/Plan    PT Assessment Patient needs continued PT services  PT Problem List Decreased strength;Pain;Decreased range of motion;Decreased activity tolerance;Decreased balance;Decreased mobility;Decreased knowledge of precautions;Decreased knowledge of use of DME       PT Treatment Interventions DME instruction;Gait training;Therapeutic activities;Therapeutic exercise;Balance training;Neuromuscular re-education    PT Goals (Current goals can be found in the Care Plan section)  Acute Rehab PT Goals Patient Stated Goal: to get back home PT Goal Formulation: With patient Time For Goal Achievement: 01/23/23 Potential to Achieve Goals: Good    Frequency Min 1X/week     Co-evaluation PT/OT/SLP Co-Evaluation/Treatment: Yes Reason for Co-Treatment: To address functional/ADL transfers PT goals addressed during session: Mobility/safety with mobility;Balance OT goals addressed during session: ADL's and self-care;Proper use of Adaptive equipment and DME       AM-PAC PT "6 Clicks" Mobility  Outcome Measure Help needed turning from your back to your side while in a flat bed without using bedrails?: None Help needed moving from lying on your back to sitting on the side of a flat bed without using bedrails?: None Help needed moving to and from a bed to a chair (including a wheelchair)?: None Help  needed standing up from a chair using your arms (e.g., wheelchair or bedside chair)?: None Help needed to walk in hospital room?: Total Help needed climbing 3-5 steps with a railing? : Total 6 Click Score: 18    End of Session   Activity Tolerance: Patient tolerated treatment well Patient left: in chair;with call bell/phone within reach;with chair alarm set Nurse Communication: Mobility status PT Visit Diagnosis: Unsteadiness on feet (R26.81);Muscle weakness (generalized) (M62.81);Pain;Other abnormalities of gait and mobility (R26.89) Pain - Right/Left: Left Pain - part of body: Knee    Time: 8295-6213 PT Time Calculation (min) (ACUTE ONLY): 18 min   Charges:   PT Evaluation $PT Eval Moderate Complexity: 1 Mod   PT General Charges $$ ACUTE PT VISIT: 1 Visit         Olga Coaster PT, DPT 3:47 PM,01/09/23

## 2023-01-09 NOTE — Anesthesia Procedure Notes (Signed)
Anesthesia Regional Block: Adductor canal block   Pre-Anesthetic Checklist: , timeout performed,  Correct Patient, Correct Site, Correct Laterality,  Correct Procedure, Correct Position, site marked,  Risks and benefits discussed,  Surgical consent,  Pre-op evaluation,  At surgeon's request and post-op pain management  Laterality: Left and Lower  Prep: chloraprep       Needles:  Injection technique: Single-shot  Needle Type: Stimiplex     Needle Length: 9cm  Needle Gauge: 22     Additional Needles:   Procedures:,,,, ultrasound used (permanent image in chart),,    Narrative:  Start time: 01/09/2023 7:39 AM End time: 01/09/2023 7:47 AM Injection made incrementally with aspirations every 5 mL.  Performed by: Personally  Anesthesiologist: Foye Deer, MD  Additional Notes: Patient consented for risk and benefits of nerve block including but not limited to nerve damage, failed block, bleeding and infection.  Patient voiced understanding.  Functioning IV was confirmed and monitors were applied.  Timeout done prior to procedure and prior to any sedation being given to the patient.  Patient confirmed procedure site prior to any sedation given to the patient. Sterile prep,hand hygiene and sterile gloves were used.  Minimal sedation used for procedure.  No paresthesia endorsed by patient during the procedure.  Negative aspiration and negative test dose prior to incremental administration of local anesthetic. The patient tolerated the procedure well with no immediate complications.

## 2023-01-09 NOTE — Op Note (Signed)
OPERATIVE NOTE   PROCEDURE: Left below-the-knee amputation  PRE-OPERATIVE DIAGNOSIS: Left foot gangrene  POST-OPERATIVE DIAGNOSIS: same as above  SURGEON: [name], MD  ASSISTANT(S): Rolla Plate, NP  ANESTHESIA: general  ESTIMATED BLOOD LOSS: 200 cc  FINDING(S): none  SPECIMEN(S):  Left below-the-knee amputation  INDICATIONS:   Corey Foster is a 67 y.o. male who presents with left leg gangrene.  The patient is scheduled for a left below-the-knee amputation.  I discussed in depth with the patient the risks, benefits, and alternatives to this procedure.  The patient is aware that the risk of this operation included but are not limited to:  bleeding, infection, myocardial infarction, stroke, death, failure to heal amputation wound, and possible need for more proximal amputation.  The patient is aware of the risks and agrees proceed forward with the procedure. An assistant was present during the procedure to help facilitate the exposure and expedite the procedure.   DESCRIPTION:  After full informed written consent was obtained from the patient, the patient was brought back to the operating room, and placed supine upon the operating table.  Prior to induction, the patient received IV antibiotics.  The patient was then prepped and draped in the standard fashion for a below-the-knee amputation. The assistant provided retraction and mobilization to help facilitate exposure and expedite the procedure throughout the entire procedure.  This included following suture, using retractors, and optimizing lighting.  After obtaining adequate anesthesia, the patient was prepped and draped in the standard fashion for a left below-the-knee amputation.  I marked out the anterior incision two finger breadths below the tibial tuberosity and then the marked out a posterior flap that was one third of the circumference of the calf in length.   I made the incisions for these flaps, and then dissected through  the subcutaneous tissue, fascia, and muscle anteriorly.  I elevated  the periosteal tissue superiorly so that the tibia was about 3-4 cm shorter than the anterior skin flap.  I then transected the tibia with a power saw and then took a wedge off the tibia anteriorly with the power saw.  Then I smoothed out the rough edges.  In a similar fashion, I cut back the fibula about two centimeters higher than the level of the tibia with a bone cutter.  I put a bone hook into the distal tibia and then used a large amputation knife to sharply develop a tissue plane through the muscle along the fibula.  In such fashion, the posterior flap was developed.  At this point, the specimen was passed off the field as the below-the-knee amputation.  At this point, I clamped all visibly bleeding arteries and veins using a combination of suture ligation with Silk suture and electrocautery.  Bleeding continued to be controlled with electrocautery and suture ligature.  The stump was washed off with sterile normal saline and no further active bleeding was noted.  I reapproximated the anterior and posterior fascia  with interrupted stitches of 0 Vicryl.  This was completed along the entire length of anterior and posterior fascia until there were no more loose space in the fascial line. I then placed a layer of 2-0 Vicryl sutures in the subcutaneous tissue. The skin was then  reapproximated with staples.  The stump was washed off and dried.  The incision was dressed with Xeroform and  then fluffs were applied.  Kerlix was wrapped around the leg and then gently an ACE wrap was applied.    COMPLICATIONS: none  CONDITION: stable   [name]  01/09/2023, 9:33 AM    This note was created with Dragon Medical transcription system. Any errors in dictation are purely unintentional.

## 2023-01-09 NOTE — Evaluation (Signed)
Occupational Therapy Evaluation Patient Details Name: Corey Foster MRN: [id number] DOB: 1955/03/12 Today's Date: 01/09/2023   History of Present Illness 67 y.o. 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   Clinical Impression   Corey Foster was seen for OT evaluation this date. Prior to hospital admission, pt was MOD I using SPC. Pt lives with daughter. Pt currently requires CGA bed>chair scoot t/f and sit<>stand + RW, MAX A pericare in standing.  Pt would benefit from skilled OT to address noted impairments and functional limitations (see below for any additional details). Upon hospital discharge, recommend OT follow up however may progress.    If plan is discharge home, recommend the following: A little help with walking and/or transfers;A little help with bathing/dressing/bathroom;Help with stairs or ramp for entrance    Functional Status Assessment  Patient has had a recent decline in their functional status and demonstrates the ability to make significant improvements in function in a reasonable and predictable amount of time.  Equipment Recommendations  BSC/3in1    Recommendations for Other Services       Precautions / Restrictions Precautions Precautions: Fall Restrictions Weight Bearing Restrictions: Yes LLE Weight Bearing: Non weight bearing      Mobility Bed Mobility Overal bed mobility: Modified Independent                  Transfers Overall transfer level: Needs assistance Equipment used: Rolling walker (2 wheels) Transfers: Sit to/from Stand, Bed to chair/wheelchair/BSC Sit to Stand: Contact  guard assist          Lateral/Scoot Transfers: Contact guard assist        Balance Overall balance assessment: Needs assistance Sitting-balance support: Feet supported, No upper extremity supported Sitting balance-Leahy Scale: Normal     Standing balance support: Bilateral upper extremity supported, Reliant on assistive device for balance Standing balance-Leahy Scale: Good                             ADL either performed or assessed with clinical judgement   ADL Overall ADL's : Needs assistance/impaired                                       General ADL Comments: CGA for simulated BSC t/f, MAX A pericare in standing.      Pertinent Vitals/Pain Pain Assessment Pain Assessment: Faces Faces Pain Scale: Hurts even more Pain Location: L residual limb Pain Descriptors / Indicators: Discomfort, Grimacing, Aching Pain Intervention(s): Limited activity within patient's tolerance, Repositioned, Patient requesting pain meds-RN notified     Extremity/Trunk Assessment Upper Extremity Assessment Upper Extremity Assessment: Overall WFL for tasks assessed   Lower Extremity Assessment Lower Extremity Assessment: Overall WFL for tasks assessed       Communication Communication Communication: No apparent difficulties   Cognition Arousal: Alert Behavior During Therapy: WFL for tasks assessed/performed Overall Cognitive Status: Within Functional Limits for tasks assessed  Home Living Family/patient expects to be discharged to:: Private residence Living Arrangements: Children Available Help at Discharge: Family;Available 24 hours/day Type of Home: Apartment Home Access: Level entry     Home Layout: One level     Bathroom Shower/Tub: Chief Strategy Officer: Standard     Home Equipment: Agricultural consultant (2 wheels);Cane - single point          Prior  Functioning/Environment Prior Level of Function : Needs assist             Mobility Comments: MOD I using SPC          OT Problem List: Decreased activity tolerance;Impaired balance (sitting and/or standing)      OT Treatment/Interventions: Self-care/ADL training;Therapeutic exercise;Energy conservation;DME and/or AE instruction;Therapeutic activities;Balance training;Patient/family education    OT Goals(Current goals can be found in the care plan section) Acute Rehab OT Goals Patient Stated Goal: to go home OT Goal Formulation: With patient Time For Goal Achievement: 01/23/23 Potential to Achieve Goals: Good ADL Goals Pt Will Perform Grooming: with modified independence;standing Pt Will Perform Lower Body Dressing: with modified independence;sit to/from stand Pt Will Transfer to Toilet: with modified independence;ambulating;regular height toilet  OT Frequency: Min 1X/week    Co-evaluation              AM-PAC OT "6 Clicks" Daily Activity     Outcome Measure Help from another person eating meals?: None Help from another person taking care of personal grooming?: A Little Help from another person toileting, which includes using toliet, bedpan, or urinal?: A Little Help from another person bathing (including washing, rinsing, drying)?: A Little Help from another person to put on and taking off regular upper body clothing?: A Little Help from another person to put on and taking off regular lower body clothing?: A Little 6 Click Score: 19   End of Session Equipment Utilized During Treatment: Gait belt;Rolling walker (2 wheels) Nurse Communication: Mobility status;Patient requests pain meds  Activity Tolerance: Patient tolerated treatment well Patient left: in chair;with call bell/phone within reach  OT Visit Diagnosis: Other abnormalities of gait and mobility (R26.89);Muscle weakness (generalized) (M62.81)                Time: 1610-9604 OT Time Calculation (min): 22  min Charges:  OT General Charges $OT Visit: 1 Visit OT Evaluation $OT Eval Moderate Complexity: 1 Mod  Kathie Dike, M.S. OTR/L  01/09/23, 4:17 PM  ascom [phone]

## 2023-01-09 NOTE — Consult Note (Signed)
PHARMACY CONSULT NOTE - ELECTROLYTES  Pharmacy Consult for Electrolyte Monitoring and Replacement   Recent Labs: Height: 5\' 11"  (180.3 cm) Weight: 112.9 kg (248 lb 14.4 oz) IBW/kg (Calculated) : 75.3 Estimated Creatinine Clearance: 101.7 mL/min (by C-G formula based on SCr of 0.9 mg/dL). Potassium (mmol/L)  Date Value  01/09/2023 4.3   Magnesium (mg/dL)  Date Value  16/11/9602 2.4   Calcium (mg/dL)  Date Value  54/10/8117 8.5 (L)   Albumin (g/dL)  Date Value  14/78/2956 3.2 (L)   Phosphorus (mg/dL)  Date Value  21/30/8657 3.3   Sodium (mmol/L)  Date Value  01/09/2023 138    Assessment  Corey Foster is a 67 y.o. male presenting with osteomyelitis. PMH significant for DM left foot gangrene. Pharmacy has been consulted to monitor and replace electrolytes. Patient to undergo left BKA today.  Diet: Regular MIVF: N/A Pertinent medications: Furosemide 20 mg PO daily  Goal of Therapy: Electrolytes WNL  Plan:  No electrolyte replacement currently warranted Check BMP, Mg, Phos with AM labs  Thank you for allowing pharmacy to be a part of this patient's care.  Will M. Dareen Piano, PharmD Clinical Pharmacist 01/09/2023 11:29 AM

## 2023-01-09 NOTE — Progress Notes (Signed)
PHARMACY CONSULT NOTE - FOLLOW UP  Pharmacy Consult for Electrolyte Monitoring and Replacement   Recent Labs: Potassium (mmol/L)  Date Value  01/09/2023 4.3   Magnesium (mg/dL)  Date Value  13/09/6576 2.4   Calcium (mg/dL)  Date Value  46/96/2952 8.5 (L)   Albumin (g/dL)  Date Value  84/13/2440 3.2 (L)   Phosphorus (mg/dL)  Date Value  12/21/2534 3.3   Sodium (mmol/L)  Date Value  01/09/2023 138     Assessment: 67 year old male admitted with nonhealing foot wound. PMH DM, HTN, HLD.  Diet: thin fluids Pertinent medications: furosemide 20mg  daily  Goal of Therapy:  Electrolytes within normal limits  Plan:  No replacement warranted Follow up electrolytes tomorrow AM   Elliot Gurney, PharmD, BCPS Clinical Pharmacist  01/09/2023 11:31 AM

## 2023-01-09 NOTE — Anesthesia Procedure Notes (Signed)
Anesthesia Regional Block: Popliteal block   Pre-Anesthetic Checklist: , timeout performed,  Correct Patient, Correct Site, Correct Laterality,  Correct Procedure, Correct Position, site marked,  Risks and benefits discussed,  Surgical consent,  Pre-op evaluation,  At surgeon's request and post-op pain management  Laterality: Left  Prep: chloraprep       Needles:  Injection technique: Single-shot  Needle Type: Stimiplex     Needle Length: 9cm  Needle Gauge: 22     Additional Needles:   Procedures:,,,, ultrasound used (permanent image in chart),,    Narrative:  Start time: 01/09/2023 7:39 AM End time: 01/09/2023 7:47 AM Injection made incrementally with aspirations every 5 mL.  Performed by: Personally  Anesthesiologist: Foye Deer, MD  Additional Notes: Patient consented for risk and benefits of nerve block including but not limited to nerve damage, failed block, bleeding and infection.  Patient voiced understanding.  Functioning IV was confirmed and monitors were applied.  Timeout done prior to procedure and prior to any sedation being given to the patient.  Patient confirmed procedure site prior to any sedation given to the patient. Sterile prep,hand hygiene and sterile gloves were used.  Minimal sedation used for procedure.  No paresthesia endorsed by patient during the procedure.  Negative aspiration and negative test dose prior to incremental administration of local anesthetic. The patient tolerated the procedure well with no immediate complications.

## 2023-01-09 NOTE — Interval H&P Note (Signed)
History and Physical Interval Note:  01/09/2023 7:23 AM  Corey Foster  has presented today for surgery, with the diagnosis of Osteomyelitis.  The various methods of treatment have been discussed with the patient and family. After consideration of risks, benefits and other options for treatment, the patient has consented to  Procedure(s): AMPUTATION BELOW KNEE (Left) as a surgical intervention.  The patient's history has been reviewed, patient examined, no change in status, stable for surgery.  I have reviewed the patient's chart and labs.  Questions were answered to the patient's satisfaction.     [name]

## 2023-01-09 NOTE — Anesthesia Preprocedure Evaluation (Addendum)
Anesthesia Evaluation  Patient identified by MRN, date of birth, ID band Patient awake    Reviewed: Allergy & Precautions, NPO status , Patient's Chart, lab work & pertinent test results  History of Anesthesia Complications Negative for: history of anesthetic complications  Airway Mallampati: III  TM Distance: >3 FB Neck ROM: Full    Dental  (+) Partial Upper, Poor Dentition, Missing   Pulmonary sleep apnea and Continuous Positive Airway Pressure Ventilation , neg COPD, Current Smoker and Patient abstained from smoking.   Pulmonary exam normal breath sounds clear to auscultation       Cardiovascular Exercise Tolerance: Good METShypertension, Pt. on medications + Peripheral Vascular Disease (PVD with recent left tibial artery angioplasty)  (-) CAD and (-) Past MI (-) dysrhythmias  Rhythm:Regular Rate:Normal - Systolic murmurs    Neuro/Psych  PSYCHIATRIC DISORDERS Anxiety     negative neurological ROS     GI/Hepatic ,GERD  Medicated and Controlled,,(+)     (-) substance abuse    Endo/Other  diabetes, Insulin Dependent    Renal/GU CRFRenal disease     Musculoskeletal  (+) Arthritis ,    Abdominal  (+) + obese  Peds  Hematology   Anesthesia Other Findings Hypoglycemia this morning without overt symptoms or tachycardia. Will treat with D50.   Past Medical History: No date: Arthritis No date: Diabetes mellitus without complication (HCC) No date: DJD (degenerative joint disease) No date: GERD (gastroesophageal reflux disease) No date: Hyperlipidemia No date: Hypertension No date: Sciatic nerve pain 06/30/2014: Secondary erythrocytosis No date: Sleep apnea  Reproductive/Obstetrics                             Anesthesia Physical Anesthesia Plan  ASA: 3  Anesthesia Plan: General   Post-op Pain Management: Ofirmev IV (intra-op)* and Regional block*   Induction: Intravenous  PONV Risk  Score and Plan: 1 and Propofol infusion, TIVA, Ondansetron and Dexamethasone  Airway Management Planned: Nasal Cannula  Additional Equipment: None  Intra-op Plan:   Post-operative Plan:   Informed Consent: I have reviewed the patients History and Physical, chart, labs and discussed the procedure including the risks, benefits and alternatives for the proposed anesthesia with the patient or authorized representative who has indicated his/her understanding and acceptance.     Dental advisory given  Plan Discussed with: CRNA and Surgeon  Anesthesia Plan Comments: (Discussed risks of anesthesia with patient, including possibility of difficulty with spontaneous ventilation under anesthesia necessitating airway intervention, PONV, and rare risks such as cardiac or respiratory or neurological events, and allergic reactions. Discussed the role of CRNA in patient's perioperative care. Patient understands.)       Anesthesia Quick Evaluation

## 2023-01-09 NOTE — Transfer of Care (Signed)
Immediate Anesthesia Transfer of Care Note  Patient: Corey Foster  Procedure(s) Performed: AMPUTATION BELOW KNEE (Left: Knee)  Patient Location: PACU  Anesthesia Type:General and Regional  Level of Consciousness: awake, alert , and oriented  Airway & Oxygen Therapy: Patient Spontanous Breathing  Post-op Assessment: Report given to RN and Post -op Vital signs reviewed and stable  Post vital signs: Reviewed and stable  Last Vitals:  Vitals Value Taken Time  BP    Temp    Pulse 91 01/09/23 0944  Resp 12 01/09/23 0944  SpO2 97 % 01/09/23 0944    Last Pain:  Vitals:   01/09/23 0707  TempSrc: Temporal  PainSc: 5       Patients Stated Pain Goal: 0 (01/08/23 2135)  Complications: No notable events documented.

## 2023-01-09 NOTE — Progress Notes (Signed)
PROGRESS NOTE    Corey Foster  AVW:[id number] DOB: July 27, 1955 DOA: 01/07/2023 PCP: Emogene Morgan, MD      Brief Narrative:   67 y.o. 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  Nonhealing ulcer.  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  Will discuss with vascular regarding length of antibiotic course   IDDM with hyperglycemia On long-acting insulin.  Sliding scale and Accu-Cheks.   PVD with recent left tibial artery angioplasty Continue aspirin and statin   Cigarette smoking Counseled to using  DVT prophylaxis: Will start once cleared by surgery Code Status: Full code Family Communication:   Status is: Inpatient Remains inpatient appropriate because: Continue hospital stay postop management    Subjective: Seen postop, no new complaints   Examination:  General exam: Appears calm and comfortable, chronically ill Respiratory system: Clear to auscultation. Respiratory effort normal. Cardiovascular system: S1 & S2 heard, RRR. No JVD, murmurs, rubs, gallops or clicks. No pedal edema. Gastrointestinal system: Abdomen is nondistended, soft and nontender. No organomegaly or masses felt. Normal bowel sounds heard. Central nervous system: Alert and oriented. No focal neurological deficits. Extremities: Left-sided  BKA noted Skin: No rashes, lesions or ulcers Psychiatry: Judgement and insight appear normal. Mood & affect appropriate.                Diet Orders (From admission, onward)     Start     Ordered   01/09/23 1115  Diet regular Room service appropriate? Yes; Fluid consistency: Thin  Diet effective now       Question Answer Comment  Room service appropriate? Yes   Fluid consistency: Thin      01/09/23 1114            Objective: Vitals:   01/09/23 1000 01/09/23 1015 01/09/23 1030 01/09/23 1054  BP: (!) 158/101 (!) 152/89 (!) 146/96 (!) 161/90  Pulse: 91 91 92 89  Resp: 14 15 (!) 23 16  Temp:  98.6 F (37 C) 98.2 F (36.8 C) 98 F (36.7 C)  TempSrc:      SpO2: 95% 96% 99% 100%  Weight:      Height:        Intake/Output Summary (Last 24 hours) at 01/09/2023 1309 Last data filed at 01/09/2023 0930 Gross per 24 hour  Intake 900 ml  Output 150 ml  Net 750 ml   Filed Weights   01/07/23 1512 01/09/23 0707  Weight: 112.9 kg 112.9 kg    Scheduled Meds:  baclofen  10 mg Oral BID   Chlorhexidine Gluconate Cloth  6 each Topical Once   empagliflozin  25 mg Oral Daily   enoxaparin (LOVENOX) injection  55 mg Subcutaneous Q24H   furosemide  20 mg Oral Daily   insulin aspart  0-15 Units Subcutaneous TID WC   insulin aspart  0-5 Units Subcutaneous QHS   insulin glargine-yfgn  30 Units Subcutaneous QHS   nicotine  21 mg Transdermal Daily   simvastatin  40 mg Oral Daily   Continuous Infusions:  Nutritional status     Body mass index is 34.71 kg/m.  Data Reviewed:   CBC: Recent Labs  Lab 01/07/23 1638 01/09/23 0555  WBC 7.8 8.1  NEUTROABS 5.5  --   HGB 15.1 15.0  HCT 44.6 44.2  MCV 94.1 93.6  PLT 263 271   Basic Metabolic Panel: Recent Labs  Lab 01/07/23 1638 01/09/23 0555  NA 139 138  K 4.1 4.3  CL 106 106  CO2 24 23  GLUCOSE 235* 51*  BUN 20 13  CREATININE 1.24 0.90  CALCIUM 8.7* 8.5*  MG  --  2.4  PHOS  --  3.3   GFR: Estimated  Creatinine Clearance: 101.7 mL/min (by C-G formula based on SCr of 0.9 mg/dL). Liver Function Tests: Recent Labs  Lab 01/07/23 1638  AST 21  ALT 21  ALKPHOS 63  BILITOT 0.3  PROT 7.2  ALBUMIN 3.2*   No results for input(s): "LIPASE", "AMYLASE" in the last 168 hours. No results for input(s): "AMMONIA" in the last 168 hours. Coagulation Profile: No results for input(s): "INR", "PROTIME" in the last 168 hours. Cardiac Enzymes: No results for input(s): "CKTOTAL", "CKMB", "CKMBINDEX", "TROPONINI" in the last 168 hours. BNP (last 3 results) No results for input(s): "PROBNP" in the last 8760 hours. HbA1C: No results for input(s): "HGBA1C" in the last 72 hours. CBG: Recent Labs  Lab 01/09/23 0712 01/09/23 0713 01/09/23 0734 01/09/23 0938 01/09/23 1113  GLUCAP 48* 42* 124* 89 113*   Lipid Profile: Recent Labs    01/07/23 1638  CHOL 110  HDL 38*  LDLCALC 52  TRIG 161  CHOLHDL 2.9   Thyroid Function Tests: No results for input(s): "TSH", "T4TOTAL", "FREET4", "T3FREE", "THYROIDAB" in the last 72 hours. Anemia Panel: No results for input(s): "VITAMINB12", "FOLATE", "FERRITIN", "TIBC", "IRON", "RETICCTPCT" in the last 72 hours. Sepsis Labs: Recent Labs  Lab 01/07/23 1638  LATICACIDVEN 1.6    No results found for this or any previous visit (from the past 240 hour(s)).       Radiology Studies: [place] OR NERVE BLOCK-IMAGE ONLY [facility])  Result Date: 01/09/2023 There is no interpretation for this exam.  This order is for images obtained during a surgical procedure.  Please See "Surgeries" Tab for more information regarding the procedure.   DG Chest 2 View  Result Date: 01/07/2023 CLINICAL DATA:  [id number] Diabetic retinopathy (HCC) [id number]. Chest pain. Shortness of breath. EXAM: CHEST - 2 VIEW COMPARISON:  12/17/2022. FINDINGS: Bilateral lung fields are clear. Bilateral costophrenic angles are clear. Normal cardio-mediastinal silhouette. No acute osseous abnormalities. The  soft tissues are within normal limits. IMPRESSION: *No active cardiopulmonary disease. Electronically Signed   By: Jules Schick M.D.   On: 01/07/2023 17:33           LOS: 2 days   Time spent= 35 mins    Miguel Rota, MD [facility]  If 7PM-7AM, please contact night-coverage  01/09/2023, 1:09 PM

## 2023-01-09 NOTE — Hospital Course (Addendum)
   Brief Narrative:   67 y.o. 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  Nonhealing ulcer.  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  Will discuss with vascular regarding length of antibiotic course   IDDM with hyperglycemia On long-acting insulin.  Sliding scale and Accu-Cheks.   PVD with recent left tibial artery angioplasty Continue aspirin and statin   Cigarette smoking Counseled to using  DVT prophylaxis: Will start once cleared by surgery Code Status: Full code Family Communication:   Status is: Inpatient Remains inpatient appropriate because: Continue hospital stay postop management    Subjective: Seen postop, no new complaints   Examination:  General exam: Appears calm and comfortable, chronically ill Respiratory system: Clear to auscultation. Respiratory effort normal. Cardiovascular system: S1 & S2 heard, RRR. No JVD, murmurs, rubs, gallops or clicks. No pedal edema. Gastrointestinal system: Abdomen is nondistended, soft and nontender. No organomegaly or masses felt. Normal bowel sounds heard. Central nervous system: Alert and oriented. No focal neurological deficits. Extremities: Left-sided BKA noted Skin: No rashes, lesions or ulcers Psychiatry: Judgement and insight appear normal. Mood & affect  appropriate.

## 2023-01-10 DIAGNOSIS — E1152 Type 2 diabetes mellitus with diabetic peripheral angiopathy with gangrene: Secondary | ICD-10-CM | POA: Diagnosis not present

## 2023-01-10 DIAGNOSIS — L97509 Non-pressure chronic ulcer of other part of unspecified foot with unspecified severity: Secondary | ICD-10-CM | POA: Diagnosis not present

## 2023-01-10 DIAGNOSIS — E11621 Type 2 diabetes mellitus with foot ulcer: Secondary | ICD-10-CM | POA: Diagnosis not present

## 2023-01-10 LAB — CBC
HCT: 39.8 % (ref 39.0–52.0)
Hemoglobin: 13.3 g/dL (ref 13.0–17.0)
MCH: 32 pg (ref 26.0–34.0)
MCHC: 33.4 g/dL (ref 30.0–36.0)
MCV: 95.9 fL (ref 80.0–100.0)
Platelets: 231 10*3/uL (ref 150–400)
RBC: 4.15 MIL/uL — ABNORMAL LOW (ref 4.22–5.81)
RDW: 14.3 % (ref 11.5–15.5)
WBC: 9.4 10*3/uL (ref 4.0–10.5)
nRBC: 0 % (ref 0.0–0.2)

## 2023-01-10 LAB — GLUCOSE, CAPILLARY
Glucose-Capillary: 233 mg/dL — ABNORMAL HIGH (ref 70–99)
Glucose-Capillary: 167 mg/dL — ABNORMAL HIGH (ref 70–99)
Glucose-Capillary: 162 mg/dL — ABNORMAL HIGH (ref 70–99)
Glucose-Capillary: 215 mg/dL — ABNORMAL HIGH (ref 70–99)

## 2023-01-10 LAB — BASIC METABOLIC PANEL
Anion gap: 8 (ref 5–15)
BUN: 13 mg/dL (ref 8–23)
CO2: 25 mmol/L (ref 22–32)
Calcium: 8.3 mg/dL — ABNORMAL LOW (ref 8.9–10.3)
Chloride: 105 mmol/L (ref 98–111)
Creatinine, Ser: 0.95 mg/dL (ref 0.61–1.24)
GFR, Estimated: >60 mL/min (ref >60)
Glucose, Bld: 136 mg/dL — ABNORMAL HIGH (ref 70–99)
Potassium: 4.7 mmol/L (ref 3.5–5.1)
Sodium: 138 mmol/L (ref 135–145)

## 2023-01-10 LAB — MAGNESIUM: Magnesium: 2.3 mg/dL (ref 1.7–2.4)

## 2023-01-10 LAB — PHOSPHORUS: Phosphorus: 2.7 mg/dL (ref 2.5–4.6)

## 2023-01-10 NOTE — Progress Notes (Signed)
PROGRESS NOTE    Corey Foster  YQM:[id number] DOB: 02-Aug-1955 DOA: 01/07/2023 PCP: Emogene Morgan, MD      Brief Narrative:   67 y.o. 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  Postop PT/OT recommended home health services.  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  Nonhealing ulcer.  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  Length of antibiotic and dressing changes per   IDDM with hyperglycemia On long-acting insulin.  Sliding scale and Accu-Cheks.   PVD with recent left tibial artery angioplasty Continue aspirin and statin   Cigarette smoking Counseled to using  PT/OT-home health, face-to-face completed  DVT prophylaxis: Will start once cleared by surgery Code Status: Full code Family Communication:   Status is: Inpatient Remains inpatient appropriate because: Continue hospital stay postop management    Subjective: No new complaints Doing ok working the therapy  Examination:  General exam: Appears calm and comfortable, chronically ill Respiratory system: Clear to auscultation. Respiratory effort normal. Cardiovascular system: S1 & S2 heard, RRR. No JVD, murmurs, rubs, gallops or clicks. No pedal edema. Gastrointestinal system: Abdomen is nondistended, soft and nontender. No organomegaly or masses felt. Normal bowel sounds heard. Central  nervous system: Alert and oriented. No focal neurological deficits. Extremities: Left-sided BKA noted Skin: No rashes, lesions or ulcers Psychiatry: Judgement and insight appear normal. Mood & affect appropriate.                Diet Orders (From admission, onward)     Start     Ordered   01/09/23 1115  Diet regular Room service appropriate? Yes; Fluid consistency: Thin  Diet effective now       Question Answer Comment  Room service appropriate? Yes   Fluid consistency: Thin      01/09/23 1114            Objective: Vitals:   01/09/23 2336 01/10/23 0311 01/10/23 0747 01/10/23 1307  BP: (!) 156/93 134/78 125/74 105/63  Pulse: (!) 112 (!) 106 (!) 106 90  Resp:   18 17  Temp: 99.7 F (37.6 C) 99.6 F (37.6 C) 99.5 F (37.5 C) 100.2 F (37.9 C)  TempSrc: Oral Oral  Oral  SpO2: 96% 97% 96% 100%  Weight:      Height:        Intake/Output Summary (Last 24 hours) at 01/10/2023 1329 Last data filed at 01/10/2023 5284 Gross per 24 hour  Intake --  Output 1700 ml  Net -1700 ml   Filed Weights   01/07/23 1512 01/09/23 0707  Weight: 112.9 kg 112.9 kg    Scheduled Meds:  baclofen  10 mg Oral BID   Chlorhexidine Gluconate Cloth  6 each Topical Once   empagliflozin  25 mg Oral Daily   enoxaparin (LOVENOX) injection  55 mg Subcutaneous Q24H   furosemide  20 mg Oral Daily   insulin aspart  0-15 Units Subcutaneous TID WC  insulin aspart  0-5 Units Subcutaneous QHS   insulin glargine-yfgn  30 Units Subcutaneous QHS   nicotine  21 mg Transdermal Daily   simvastatin  40 mg Oral Daily   Continuous Infusions:  Nutritional status     Body mass index is 34.71 kg/m.  Data Reviewed:   CBC: Recent Labs  Lab 01/07/23 1638 01/09/23 0555 01/10/23 0416  WBC 7.8 8.1 9.4  NEUTROABS 5.5  --   --   HGB 15.1 15.0 13.3  HCT 44.6 44.2 39.8  MCV 94.1 93.6 95.9  PLT 263 271 231   Basic Metabolic Panel: Recent Labs  Lab 01/07/23 1638 01/09/23 0555  01/10/23 0416  NA 139 138 138  K 4.1 4.3 4.7  CL 106 106 105  CO2 24 23 25   GLUCOSE 235* 51* 136*  BUN 20 13 13   CREATININE 1.24 0.90 0.95  CALCIUM 8.7* 8.5* 8.3*  MG  --  2.4 2.3  PHOS  --  3.3 2.7   GFR: Estimated Creatinine Clearance: 96.4 mL/min (by C-G formula based on SCr of 0.95 mg/dL). Liver Function Tests: Recent Labs  Lab 01/07/23 1638  AST 21  ALT 21  ALKPHOS 63  BILITOT 0.3  PROT 7.2  ALBUMIN 3.2*   No results for input(s): "LIPASE", "AMYLASE" in the last 168 hours. No results for input(s): "AMMONIA" in the last 168 hours. Coagulation Profile: No results for input(s): "INR", "PROTIME" in the last 168 hours. Cardiac Enzymes: No results for input(s): "CKTOTAL", "CKMB", "CKMBINDEX", "TROPONINI" in the last 168 hours. BNP (last 3 results) No results for input(s): "PROBNP" in the last 8760 hours. HbA1C: No results for input(s): "HGBA1C" in the last 72 hours. CBG: Recent Labs  Lab 01/09/23 1113 01/09/23 1624 01/09/23 2040 01/10/23 0749 01/10/23 1147  GLUCAP 113* 158* 196* 233* 167*   Lipid Profile: Recent Labs    01/07/23 1638  CHOL 110  HDL 38*  LDLCALC 52  TRIG 409  CHOLHDL 2.9   Thyroid Function Tests: No results for input(s): "TSH", "T4TOTAL", "FREET4", "T3FREE", "THYROIDAB" in the last 72 hours. Anemia Panel: No results for input(s): "VITAMINB12", "FOLATE", "FERRITIN", "TIBC", "IRON", "RETICCTPCT" in the last 72 hours. Sepsis Labs: Recent Labs  Lab 01/07/23 1638  LATICACIDVEN 1.6    No results found for this or any previous visit (from the past 240 hour(s)).       Radiology Studies: [place] OR NERVE BLOCK-IMAGE ONLY [facility])  Result Date: 01/09/2023 There is no interpretation for this exam.  This order is for images obtained during a surgical procedure.  Please See "Surgeries" Tab for more information regarding the procedure.           LOS: 3 days   Time spent= 35 mins    Miguel Rota, MD [facility]  If 7PM-7AM,  please contact night-coverage  01/10/2023, 1:29 PM

## 2023-01-10 NOTE — Progress Notes (Signed)
Physical Therapy Treatment Patient Details Name: Corey Foster MRN: [id number] DOB: [birth year] Today's Date: 01/10/2023   History of Present Illness Pt is a 67 y.o. 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    PT Comments  Pt was long sitting in bed upon arrival. He is alert and agreeable however does have some STM concerns. Pt has been endorsing plan to DC home with daughter assisting him 24/7. Son arrived and stated that pt will not have assistance at DC and that they were under the impression he will be going to rehab. MD/TOC made aware. DC recs updated due to pt's lack of available assistance + need for assistance to safely perform ADLs. Pt was able to exit L side of bed,stand several times to RW prior to stand pivot to w/c. He was then able to self propel w/c around RN unit. Pt will need re-education on how to use WC leg rest,brakes, and overall importance of safe routine mobility. Acute PT will continue to follow per current POC.     If plan is discharge home, recommend the following: A little help with walking and/or transfers;A little help with bathing/dressing/bathroom;Assistance with cooking/housework;Assist for transportation;Help with stairs or ramp for entrance;Assistance with feeding;Direct supervision/assist for medications management     Equipment Recommendations  Wheelchair (measurements PT);Wheelchair cushion (measurements PT);BSC/3in1 (drop arm BSC)       Precautions / Restrictions Precautions Precautions: Fall Restrictions Weight Bearing Restrictions: Yes LLE Weight Bearing: Non weight bearing      Mobility  Bed Mobility Overal bed mobility: Needs Assistance Bed Mobility: Supine to Sit  Supine to sit: Supervision     Transfers Overall transfer level: Needs assistance Equipment used: Rolling walker (2 wheels) Transfers: Sit to/from Stand, Bed to chair/wheelchair/BSC Sit to Stand: Contact guard assist Stand pivot transfers: Contact guard assist  General transfer comment: Pt stood EOB 2 x with vcs for fwd wt shift and overall technique improvements. pt was able to stand pivot with CGA + vcs    Ambulation/Gait  General Gait Details: NT   [employer] officer mobility: Yes Wheelchair propulsion: Both upper extremities Wheelchair parts: Supervision/cueing Distance: 200 Wheelchair Assistance Details (indicate cue type and reason): pt was educated on parts of W/C and how to manage them. will need re-eduaction due to pt's poor STM concerns     Balance Overall balance assessment: Needs assistance Sitting-balance support: Feet supported, No upper extremity supported Sitting balance-Leahy Scale: Good     Standing balance support: Bilateral upper extremity supported, Reliant on assistive device for balance Standing balance-Leahy Scale: Fair Standing balance comment: reliant on RW for all standing        Cognition Arousal: Alert Behavior During Therapy: WFL for tasks assessed/performed Overall Cognitive Status: Within Functional Limits for tasks assessed      General Comments: Pt is alert however stated that he was going to be Dcng home with his daughter helping him. Per son, this is not true and that pt lives alone and will not have assistance at DC           General Comments General comments (skin integrity, edema, etc.): reviewed importance of knee extension exercises and OOB  activity. Pt states understanding      Pertinent Vitals/Pain Pain Assessment Pain Assessment: 0-10 Pain Score: 4  Pain Location: L residual limb Pain  Descriptors / Indicators: Discomfort, Grimacing, Aching Pain Intervention(s): Limited activity within patient's tolerance, Monitored during session, Premedicated before session, Repositioned     PT Goals (current goals can now be found in the care plan section) Acute Rehab PT Goals Patient Stated Goal: go home with my daughter Progress towards PT goals: Progressing toward goals    Frequency    Min 1X/week       AM-PAC PT "6 Clicks" Mobility   Outcome Measure  Help needed turning from your back to your side while in a flat bed without using bedrails?: A Little Help needed moving from lying on your back to sitting on the side of a flat bed without using bedrails?: A Little Help needed moving to and from a bed to a chair (including a wheelchair)?: A Little Help needed standing up from a chair using your arms (e.g., wheelchair or bedside chair)?: A Little Help needed to walk in hospital room?: A Lot Help needed climbing 3-5 steps with a railing? : Total 6 Click Score: 15    End of Session   Activity Tolerance: Patient tolerated treatment well Patient left: in chair;with call bell/phone within reach;with chair alarm set Nurse Communication: Mobility status PT Visit Diagnosis: Unsteadiness on feet (R26.81);Muscle weakness (generalized) (M62.81);Pain;Other abnormalities of gait and mobility (R26.89) Pain - Right/Left: Left Pain - part of body: Knee     Time: 1610-9604 PT Time Calculation (min) (ACUTE ONLY): 19 min  Charges:    $Therapeutic Activity: 8-22 mins PT General Charges $$ ACUTE PT VISIT: 1 Visit                     Jetta Lout PTA 01/10/23, 2:21 PM

## 2023-01-10 NOTE — Progress Notes (Signed)
PHARMACY CONSULT NOTE - FOLLOW UP  Pharmacy Consult for Electrolyte Monitoring and Replacement   Recent Labs: Potassium (mmol/L)  Date Value  01/10/2023 4.7   Magnesium (mg/dL)  Date Value  16/11/9602 2.3   Calcium (mg/dL)  Date Value  54/10/8117 8.3 (L)   Albumin (g/dL)  Date Value  14/78/2956 3.2 (L)   Phosphorus (mg/dL)  Date Value  21/30/8657 2.7   Sodium (mmol/L)  Date Value  01/10/2023 138     Assessment: 67 year old male admitted with nonhealing foot wound. PMH DM, HTN, HLD.  Diet:reg Pertinent medications: furosemide 20mg  daily  Goal of Therapy:  Electrolytes within normal limits  Plan:  No replacement warranted Follow up electrolytes tomorrow AM   Bari Mantis PharmD Clinical Pharmacist 01/10/2023

## 2023-01-10 NOTE — Progress Notes (Signed)
1 Day Post-Op   Subjective/Chief Complaint: Noted LEFT BKA stump discomfort. Pain well controlled. Otherwise without complaint.   Objective: Vital signs in last 24 hours: Temp:  [97.8 F (36.6 C)-99.7 F (37.6 C)] 99.5 F (37.5 C) (11/16 0747) Pulse Rate:  [89-112] 106 (11/16 0747) Resp:  [16-18] 18 (11/16 0747) BP: (125-161)/(74-93) 125/74 (11/16 0747) SpO2:  [96 %-100 %] 96 % (11/16 0747) Last BM Date : 01/09/23  Intake/Output from previous day: 11/15 0701 - 11/16 0700 In: 900 [I.V.:800; IV Piggyback:100] Out: 1850 [Urine:1700; Blood:150] Intake/Output this shift: No intake/output data recorded.  General appearance: alert and no distress Extremities: LEFT BKA stump- dressing- C/DI, thigh soft  Lab Results:  Recent Labs    01/09/23 0555 01/10/23 0416  WBC 8.1 9.4  HGB 15.0 13.3  HCT 44.2 39.8  PLT 271 231   BMET Recent Labs    01/09/23 0555 01/10/23 0416  NA 138 138  K 4.3 4.7  CL 106 105  CO2 23 25  GLUCOSE 51* 136*  BUN 13 13  CREATININE 0.90 0.95  CALCIUM 8.5* 8.3*   PT/INR No results for input(s): "LABPROT", "INR" in the last 72 hours. ABG No results for input(s): "PHART", "HCO3" in the last 72 hours.  Invalid input(s): "PCO2", "PO2"  Studies/Results: [place] OR NERVE BLOCK-IMAGE ONLY [facility])  Result Date: 01/09/2023 There is no interpretation for this exam.  This order is for images obtained during a surgical procedure.  Please See "Surgeries" Tab for more information regarding the procedure.    Anti-infectives: Anti-infectives (From admission, onward)    Start     Dose/Rate Route Frequency Ordered Stop   01/08/23 1219  ceFAZolin (ANCEF) IVPB 2g/100 mL premix        2 g 200 mL/hr over 30 Minutes Intravenous 30 min pre-op 01/08/23 1219 01/09/23 0831   01/07/23 2200  amoxicillin-clavulanate (AUGMENTIN) 875-125 MG per tablet 1 tablet  Status:  Discontinued        1 tablet Oral 2 times daily 01/07/23 1756 01/07/23 2037        Assessment/Plan: s/p Procedure(s): AMPUTATION BELOW KNEE (Left) POD #1 s/p LEFT BKA OOB as tolerated with PT Pain control Will change dressing on Monday  LOS: 3 days    Eli Hose A 01/10/2023

## 2023-01-10 NOTE — Plan of Care (Signed)

## 2023-01-11 DIAGNOSIS — E1152 Type 2 diabetes mellitus with diabetic peripheral angiopathy with gangrene: Secondary | ICD-10-CM | POA: Diagnosis not present

## 2023-01-11 DIAGNOSIS — E11621 Type 2 diabetes mellitus with foot ulcer: Secondary | ICD-10-CM | POA: Diagnosis not present

## 2023-01-11 DIAGNOSIS — L97509 Non-pressure chronic ulcer of other part of unspecified foot with unspecified severity: Secondary | ICD-10-CM | POA: Diagnosis not present

## 2023-01-11 LAB — GLUCOSE, CAPILLARY
Glucose-Capillary: 127 mg/dL — ABNORMAL HIGH (ref 70–99)
Glucose-Capillary: 162 mg/dL — ABNORMAL HIGH (ref 70–99)
Glucose-Capillary: 265 mg/dL — ABNORMAL HIGH (ref 70–99)
Glucose-Capillary: 317 mg/dL — ABNORMAL HIGH (ref 70–99)

## 2023-01-11 LAB — BASIC METABOLIC PANEL
Anion gap: 8 (ref 5–15)
BUN: 19 mg/dL (ref 8–23)
CO2: 26 mmol/L (ref 22–32)
Calcium: 8.1 mg/dL — ABNORMAL LOW (ref 8.9–10.3)
Chloride: 100 mmol/L (ref 98–111)
Creatinine, Ser: 1.31 mg/dL — ABNORMAL HIGH (ref 0.61–1.24)
GFR, Estimated: 60 mL/min — ABNORMAL LOW (ref >60)
Glucose, Bld: 155 mg/dL — ABNORMAL HIGH (ref 70–99)
Potassium: 4.3 mmol/L (ref 3.5–5.1)
Sodium: 134 mmol/L — ABNORMAL LOW (ref 135–145)

## 2023-01-11 LAB — CBC
HCT: 35 % — ABNORMAL LOW (ref 39.0–52.0)
Hemoglobin: 11.7 g/dL — ABNORMAL LOW (ref 13.0–17.0)
MCH: 31.5 pg (ref 26.0–34.0)
MCHC: 33.4 g/dL (ref 30.0–36.0)
MCV: 94.3 fL (ref 80.0–100.0)
Platelets: 192 10*3/uL (ref 150–400)
RBC: 3.71 MIL/uL — ABNORMAL LOW (ref 4.22–5.81)
RDW: 14.4 % (ref 11.5–15.5)
WBC: 10.5 10*3/uL (ref 4.0–10.5)
nRBC: 0 % (ref 0.0–0.2)

## 2023-01-11 LAB — MAGNESIUM: Magnesium: 2.4 mg/dL (ref 1.7–2.4)

## 2023-01-11 NOTE — Plan of Care (Signed)
  Problem: Education: Goal: Ability to describe self-care measures that may prevent or decrease complications (Diabetes Survival Skills Education) will improve Outcome: Progressing   Problem: Fluid Volume: Goal: Ability to maintain a balanced intake and output will improve Outcome: Progressing   Problem: Nutritional: Goal: Maintenance of adequate nutrition will improve Outcome: Progressing   Problem: Education: Goal: Knowledge of General Education information will improve Description: Including pain rating scale, medication(s)/side effects and non-pharmacologic comfort measures Outcome: Progressing   Problem: Health Behavior/Discharge Planning: Goal: Ability to manage health-related needs will improve Outcome: Progressing

## 2023-01-11 NOTE — Progress Notes (Signed)
PROGRESS NOTE    Corey Foster  ZHY:[id number] DOB: April 10, 1955 DOA: 01/07/2023 PCP: Emogene Morgan, MD      Brief Narrative:   67 y.o. 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  Postop PT/OT recommended SNF  Assessment & Plan:  Principal Problem:   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) Active Problems:   Dry gangrene (HCC)   Critical limb ischemia of left lower extremity (HCC)    Chronic left diabetic foot infection with dry gangrene like changes Status post left-sided BKA Seen by vascular.  Nonhealing ulcer.  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  Length of antibiotic and dressing changes per Vasc. They plan to see him again on Monday.    IDDM with hyperglycemia On long-acting insulin.  Sliding scale and Accu-Cheks.  Mild Renal Insuff -Cr Fluctuating between 0.9-1.3. Keep monitoring. Encouraging oral hydration.    PVD with recent left tibial artery angioplasty Continue aspirin and statin   Cigarette smoking Counseled to using  PT/OT-SNF  DVT prophylaxis: Will start once cleared by surgery Code Status: Full code Family Communication:   Status is: Inpatient Remains inpatient appropriate because:  Dressing change and further recs by Vasc on Monday, thereafter SNF. TOC aware.    Subjective: Doing well no complaints  Examination:  General exam: Appears calm and comfortable, chronically ill Respiratory system: Clear to auscultation. Respiratory effort normal. Cardiovascular system: S1 & S2 heard, RRR. No JVD, murmurs, rubs, gallops or clicks. No pedal edema. Gastrointestinal system:  Abdomen is nondistended, soft and nontender. No organomegaly or masses felt. Normal bowel sounds heard. Central nervous system: Alert and oriented. No focal neurological deficits. Extremities: Left-sided BKA noted Skin: No rashes, lesions or ulcers Psychiatry: Judgement and insight appear normal. Mood & affect appropriate.                Diet Orders (From admission, onward)     Start     Ordered   01/09/23 1115  Diet regular Room service appropriate? Yes; Fluid consistency: Thin  Diet effective now       Question Answer Comment  Room service appropriate? Yes   Fluid consistency: Thin      01/09/23 1114            Objective: Vitals:   01/10/23 1307 01/10/23 1604 01/11/23 0314 01/11/23 0820  BP: 105/63 110/74 113/74 104/64  Pulse: 90 95 91 90  Resp: 17 18  17   Temp: 100.2 F (37.9 C) 98 F (36.7 C) 98.9 F (37.2 C) 98.6 F (37 C)  TempSrc: Oral  Oral   SpO2: 100% 96% 100% 96%  Weight:      Height:        Intake/Output Summary (Last 24 hours) at 01/11/2023 1311 Last data filed at 01/11/2023 1129 Gross per 24 hour  Intake 240 ml  Output 3550 ml  Net -3310 ml   Filed Weights   01/07/23 1512 01/09/23 0707  Weight: 112.9 kg 112.9 kg    Scheduled Meds:  baclofen  10 mg Oral BID   Chlorhexidine Gluconate Cloth  6 each Topical Once   empagliflozin  25 mg Oral Daily   enoxaparin (LOVENOX) injection  55  mg Subcutaneous Q24H   furosemide  20 mg Oral Daily   insulin aspart  0-15 Units Subcutaneous TID WC   insulin aspart  0-5 Units Subcutaneous QHS   insulin glargine-yfgn  30 Units Subcutaneous QHS   nicotine  21 mg Transdermal Daily   simvastatin  40 mg Oral Daily   Continuous Infusions:  Nutritional status     Body mass index is 34.71 kg/m.  Data Reviewed:   CBC: Recent Labs  Lab 01/07/23 1638 01/09/23 0555 01/10/23 0416 01/11/23 0610  WBC 7.8 8.1 9.4 10.5  NEUTROABS 5.5  --   --   --   HGB 15.1 15.0 13.3 11.7*  HCT 44.6 44.2 39.8  35.0*  MCV 94.1 93.6 95.9 94.3  PLT 263 271 231 192   Basic Metabolic Panel: Recent Labs  Lab 01/07/23 1638 01/09/23 0555 01/10/23 0416 01/11/23 0610  NA 139 138 138 134*  K 4.1 4.3 4.7 4.3  CL 106 106 105 100  CO2 24 23 25 26   GLUCOSE 235* 51* 136* 155*  BUN 20 13 13 19   CREATININE 1.24 0.90 0.95 1.31*  CALCIUM 8.7* 8.5* 8.3* 8.1*  MG  --  2.4 2.3 2.4  PHOS  --  3.3 2.7  --    GFR: Estimated Creatinine Clearance: 69.9 mL/min (A) (by C-G formula based on SCr of 1.31 mg/dL (H)). Liver Function Tests: Recent Labs  Lab 01/07/23 1638  AST 21  ALT 21  ALKPHOS 63  BILITOT 0.3  PROT 7.2  ALBUMIN 3.2*   No results for input(s): "LIPASE", "AMYLASE" in the last 168 hours. No results for input(s): "AMMONIA" in the last 168 hours. Coagulation Profile: No results for input(s): "INR", "PROTIME" in the last 168 hours. Cardiac Enzymes: No results for input(s): "CKTOTAL", "CKMB", "CKMBINDEX", "TROPONINI" in the last 168 hours. BNP (last 3 results) No results for input(s): "PROBNP" in the last 8760 hours. HbA1C: No results for input(s): "HGBA1C" in the last 72 hours. CBG: Recent Labs  Lab 01/10/23 1147 01/10/23 1612 01/10/23 2235 01/11/23 0754 01/11/23 1156  GLUCAP 167* 162* 215* 127* 162*   Lipid Profile: No results for input(s): "CHOL", "HDL", "LDLCALC", "TRIG", "CHOLHDL", "LDLDIRECT" in the last 72 hours. Thyroid Function Tests: No results for input(s): "TSH", "T4TOTAL", "FREET4", "T3FREE", "THYROIDAB" in the last 72 hours. Anemia Panel: No results for input(s): "VITAMINB12", "FOLATE", "FERRITIN", "TIBC", "IRON", "RETICCTPCT" in the last 72 hours. Sepsis Labs: Recent Labs  Lab 01/07/23 1638  LATICACIDVEN 1.6    No results found for this or any previous visit (from the past 240 hour(s)).       Radiology Studies: No results found.         LOS: 4 days   Time spent= 35 mins    Miguel Rota, MD [facility]  If 7PM-7AM, please contact  night-coverage  01/11/2023, 1:11 PM

## 2023-01-11 NOTE — Progress Notes (Signed)
PHARMACY CONSULT NOTE - FOLLOW UP  Pharmacy Consult for Electrolyte Monitoring and Replacement   Recent Labs: Potassium (mmol/L)  Date Value  01/11/2023 4.3   Magnesium (mg/dL)  Date Value  16/11/9602 2.4   Calcium (mg/dL)  Date Value  54/10/8117 8.1 (L)   Albumin (g/dL)  Date Value  14/78/2956 3.2 (L)   Phosphorus (mg/dL)  Date Value  21/30/8657 2.7   Sodium (mmol/L)  Date Value  01/11/2023 134 (L)     Assessment: 67 year old male admitted with nonhealing foot wound. PMH DM, HTN, HLD.  Pertinent medications: furosemide 20mg  po daily  Goal of Therapy:  Electrolytes within normal limits  Plan:  No replacement warranted Follow up electrolytes tomorrow AM   Burnis Medin, PharmD, BCPS Clinical Pharmacist 01/11/2023

## 2023-01-11 NOTE — Plan of Care (Signed)
  Problem: Education: Goal: Ability to describe self-care measures that may prevent or decrease complications (Diabetes Survival Skills Education) will improve Outcome: Progressing Goal: Individualized Educational Video(s) Outcome: Progressing   Problem: Coping: Goal: Ability to adjust to condition or change in health will improve Outcome: Progressing   Problem: Fluid Volume: Goal: Ability to maintain a balanced intake and output will improve Outcome: Progressing   Problem: Health Behavior/Discharge Planning: Goal: Ability to identify and utilize available resources and services will improve Outcome: Progressing Goal: Ability to manage health-related needs will improve Outcome: Progressing   Problem: Metabolic: Goal: Ability to maintain appropriate glucose levels will improve Outcome: Progressing   Problem: Nutritional: Goal: Maintenance of adequate nutrition will improve Outcome: Progressing Goal: Progress toward achieving an optimal weight will improve Outcome: Progressing   Problem: Skin Integrity: Goal: Risk for impaired skin integrity will decrease Outcome: Progressing   Problem: Tissue Perfusion: Goal: Adequacy of tissue perfusion will improve Outcome: Progressing   Problem: Education: Goal: Knowledge of General Education information will improve Description: Including pain rating scale, medication(s)/side effects and non-pharmacologic comfort measures Outcome: Progressing   Problem: Clinical Measurements: Goal: Ability to maintain clinical measurements within normal limits will improve Outcome: Progressing Goal: Will remain free from infection Outcome: Progressing Goal: Diagnostic test results will improve Outcome: Progressing Goal: Respiratory complications will improve Outcome: Progressing Goal: Cardiovascular complication will be avoided Outcome: Progressing   Problem: Activity: Goal: Risk for activity intolerance will decrease Outcome: Progressing    Problem: Elimination: Goal: Will not experience complications related to bowel motility Outcome: Progressing Goal: Will not experience complications related to urinary retention Outcome: Progressing   Problem: Pain Management: Goal: General experience of comfort will improve Outcome: Progressing

## 2023-01-11 NOTE — Plan of Care (Signed)
  Problem: Education: Goal: Ability to describe self-care measures that may prevent or decrease complications (Diabetes Survival Skills Education) will improve Outcome: Progressing   Problem: Fluid Volume: Goal: Ability to maintain a balanced intake and output will improve Outcome: Progressing   Problem: Nutritional: Goal: Maintenance of adequate nutrition will improve Outcome: Progressing   Problem: Pain Management: Goal: General experience of comfort will improve Outcome: Progressing   Problem: Skin Integrity: Goal: Risk for impaired skin integrity will decrease Outcome: Progressing

## 2023-01-12 LAB — CBC
HCT: 35.6 % — ABNORMAL LOW (ref 39.0–52.0)
Hemoglobin: 11.9 g/dL — ABNORMAL LOW (ref 13.0–17.0)
MCH: 31.7 pg (ref 26.0–34.0)
MCHC: 33.4 g/dL (ref 30.0–36.0)
MCV: 94.9 fL (ref 80.0–100.0)
Platelets: 200 10*3/uL (ref 150–400)
RBC: 3.75 MIL/uL — ABNORMAL LOW (ref 4.22–5.81)
RDW: 14.3 % (ref 11.5–15.5)
WBC: 8.1 10*3/uL (ref 4.0–10.5)
nRBC: 0 % (ref 0.0–0.2)

## 2023-01-12 LAB — BASIC METABOLIC PANEL
Anion gap: 9 (ref 5–15)
BUN: 19 mg/dL (ref 8–23)
CO2: 27 mmol/L (ref 22–32)
Calcium: 8.4 mg/dL — ABNORMAL LOW (ref 8.9–10.3)
Chloride: 101 mmol/L (ref 98–111)
Creatinine, Ser: 1.21 mg/dL (ref 0.61–1.24)
GFR, Estimated: >60 mL/min (ref >60)
Glucose, Bld: 162 mg/dL — ABNORMAL HIGH (ref 70–99)
Potassium: 4.5 mmol/L (ref 3.5–5.1)
Sodium: 137 mmol/L (ref 135–145)

## 2023-01-12 LAB — MAGNESIUM: Magnesium: 2.6 mg/dL — ABNORMAL HIGH (ref 1.7–2.4)

## 2023-01-12 LAB — GLUCOSE, CAPILLARY
Glucose-Capillary: 174 mg/dL — ABNORMAL HIGH (ref 70–99)
Glucose-Capillary: 141 mg/dL — ABNORMAL HIGH (ref 70–99)
Glucose-Capillary: 213 mg/dL — ABNORMAL HIGH (ref 70–99)
Glucose-Capillary: 256 mg/dL — ABNORMAL HIGH (ref 70–99)
Glucose-Capillary: 228 mg/dL — ABNORMAL HIGH (ref 70–99)

## 2023-01-12 MED ORDER — OXYCODONE HCL 5 MG PO TABS: 5.0000 mg | ORAL_TABLET | Freq: Four times a day (QID) | ORAL | 0 refills | Status: DC | PRN

## 2023-01-12 MED ORDER — TRAZODONE HCL 50 MG PO TABS: 50.0000 mg | ORAL_TABLET | Freq: Every evening | ORAL | Status: DC | PRN

## 2023-01-12 MED ORDER — PREGABALIN 75 MG PO CAPS
75.0000 mg | ORAL_CAPSULE | Freq: Every evening | ORAL | Status: DC
  Administered 2023-01-12 – 2023-01-14 (×3): 75 mg via ORAL
  Filled 2023-01-12 (×3): qty 1

## 2023-01-12 MED ORDER — ALPRAZOLAM 1 MG PO TABS
1.0000 mg | ORAL_TABLET | Freq: Four times a day (QID) | ORAL | Status: DC | PRN
  Administered 2023-01-13: 1 mg via ORAL
  Filled 2023-01-12: qty 1

## 2023-01-12 NOTE — Progress Notes (Signed)
PHARMACY CONSULT NOTE - FOLLOW UP  Pharmacy Consult for Electrolyte Monitoring and Replacement   Recent Labs: Potassium (mmol/L)  Date Value  01/12/2023 4.5   Magnesium (mg/dL)  Date Value  32/44/0102 2.6 (H)   Calcium (mg/dL)  Date Value  72/53/6644 8.4 (L)   Albumin (g/dL)  Date Value  03/47/4259 3.2 (L)   Phosphorus (mg/dL)  Date Value  56/38/7564 2.7   Sodium (mmol/L)  Date Value  01/12/2023 137     Assessment: 67 year old male admitted with nonhealing foot wound. PMH DM, HTN, HLD.  Pertinent medications: furosemide 20mg  po daily  Goal of Therapy:  Electrolytes within normal limits  Plan:  No replacement warranted Follow up electrolytes tomorrow AM   Elliot Gurney, PharmD, BCPS Clinical Pharmacist  01/12/2023 7:33 AM

## 2023-01-12 NOTE — Progress Notes (Signed)
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  He is recovering as expected.  He does not endorse any pain of the left lower extremity and stump today.  I changed the dressing today.  No signs or symptoms of infection hematoma seroma to note.  Staples are all intact as well as incision line with staples.  Patient continues to work with PT OT.  No complaints overnight.  Vitals all remained stable.  Patient agrees to go to SNF when bed available.   Vitals:   01/12/23 0742 01/12/23 1403  BP: 130/78 116/80  Pulse: 95 88  Resp: 18 18  Temp: 98.4 F (36.9 C) 98.6 F (37 C)  SpO2: 100% 100%   Physical Exam: Cardiac:  RRR, normal S1 and S2.  No murmurs. Lungs: Nonlabored breathing.  Clear on auscultation throughout.  No rales rhonchi or wheezing. Incisions: Left lower extremity BKA incision.  Dressing changed today.  No signs or symptoms of infection, hematoma or seroma to note.  Healing well. Extremities: Left BKA with dressing clean dry and intact.  Changed today.  Right lower extremity warm to touch with palpable DP and femoral pulses. Abdomen: Positive bowel sounds throughout, soft, nondistended nontender. Neurologic: Alert to person place and time, answers all questions and follows commands appropriately.  CBC    Component Value Date/Time   WBC 8.1 01/12/2023 0416   RBC 3.75 (L) 01/12/2023 0416   HGB 11.9 (L) 01/12/2023 0416   HGB 16.4 08/02/2013 0916   HCT 35.6 (L) 01/12/2023 0416   HCT 47.1 03/27/2020 1012   PLT 200 01/12/2023 0416   PLT 205 08/02/2013 0916   MCV 94.9 01/12/2023 0416   MCV 94 08/02/2013 0916   MCH 31.7 01/12/2023 0416   MCHC 33.4 01/12/2023 0416   RDW 14.3 01/12/2023 0416   RDW 15.5 (H) 08/02/2013 0916   LYMPHSABS 1.4 01/07/2023 1638   LYMPHSABS 2.1 08/02/2013 0916   MONOABS 0.7 01/07/2023 1638   MONOABS 1.2 (H) 08/02/2013 0916   EOSABS 0.1 01/07/2023 1638   EOSABS 0.1  08/02/2013 0916   BASOSABS 0.0 01/07/2023 1638   BASOSABS 0.1 08/02/2013 0916    BMET    Component Value Date/Time   NA 137 01/12/2023 0416   K 4.5 01/12/2023 0416   CL 101 01/12/2023 0416   CO2 27 01/12/2023 0416   GLUCOSE 162 (H) 01/12/2023 0416   BUN 19 01/12/2023 0416   CREATININE 1.21 01/12/2023 0416   CALCIUM 8.4 (L) 01/12/2023 0416   GFRNONAA >60 01/12/2023 0416   GFRAA 44 (L) 04/15/2019 1432    INR    Component Value Date/Time   INR 1.0 12/17/2022 1647     Intake/Output Summary (Last 24 hours) at 01/12/2023 1514 Last data filed at 01/12/2023 1429 Gross per 24 hour  Intake 240 ml  Output 2300 ml  Net -2060 ml     Assessment/Plan:  67 y.o. male is s/p left lower extremity BKA.  3 Days Post-Op   PLAN: Pain medication as needed Continue to work with PT/OT. Dressing changed today to BKA.  Daily dressing changes as follows. Remove the old dressing. Xeroform strip gauze over staple line. Cover with 4 x 4's. Cover with ABD pads. Wrap with Kerlix x 2. Wrapped snugly with 6 inch Ace bandage. Change daily.  Okay per vascular surgery for patient to be discharged to SNF/rehab when medically appropriate  and bed available.  DVT prophylaxis: Lovenox 55 mg SQ every 24 hours.   Marcie [facility] 01/12/2023 3:14 PM

## 2023-01-12 NOTE — Care Management Important Message (Signed)
Important Message  Patient Details  Name: Corey Foster MRN: [id number] Date of Birth: [birth year]   Important Message Given:  N/A - LOS <3 / Initial given by admissions     Corey Foster 01/12/2023, 9:56 AM

## 2023-01-12 NOTE — TOC Progression Note (Signed)
Transition of Care [facility]) - Progression Note    Patient Details  Name: Corey Foster MRN: [id number] Date of Birth: [birth year]  [facility] of [facility]) CM/SW Contact  Marlowe Sax, RN Phone Number: 01/12/2023, 2:33 PM  Clinical Narrative:    Met with the patient in the room at the bedside He is agreeable to go to STR SNF I explained the bed search process He stated that he does not have a preferred choice Bedsearch sent, will review offers once obtained   Expected Discharge Plan: Skilled Nursing Facility Barriers to Discharge: SNF Pending bed offer, Insurance Authorization  Expected Discharge Plan and Services   Discharge Planning Services: CM Consult   Living arrangements for the past 2 months: Single Family Home                   DME Agency: NA       HH Arranged: NA           Social Determinants of Health (SDOH) Interventions SDOH Screenings   Food Insecurity: No Food Insecurity (01/07/2023)  Housing: Low Risk  (01/07/2023)  Transportation Needs: No Transportation Needs (01/07/2023)  Utilities: Not At Risk (01/07/2023)  Tobacco Use: High Risk (01/09/2023)    Readmission Risk Interventions     No data to display

## 2023-01-12 NOTE — NC FL2 (Signed)
MEDICAID FL2 LEVEL OF CARE FORM     IDENTIFICATION  Patient Name: Corey Foster Birthdate: [birth year] Sex: male Admission Date (Current Location): 01/07/2023  [facility] and [place] Number:  Chiropodist and Address:  [facility], [address] [place], [place] [address]      Provider Number: [id number]  Attending Physician Name and Address:  Gillis Santa, MD  Relative Name and Phone Number:       Current Level of Care:   Recommended Level of Care:   Prior Approval Number:    Date Approved/Denied:   PASRR Number: [id number]  Discharge Plan: SNF    Current Diagnoses: Patient Active Problem List   Diagnosis Date Noted   Critical limb ischemia of left lower extremity (HCC) 01/08/2023   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) 01/07/2023   Diabetic foot ulcer associated with secondary diabetes mellitus (HCC) 12/17/2022   Dry gangrene (HCC) 12/17/2022   Wound infection after surgery 12/09/2022   Left foot pain 12/08/2022   Hypoglycemia 12/08/2022   Gangrene (HCC) 11/28/2022   Foot osteomyelitis, left (HCC) 11/27/2022   Hyperosmolar hyponatremia 11/27/2022   CKD stage 3b, GFR 30-44 ml/min (HCC) 11/27/2022   DKA (diabetic ketoacidosis) (HCC) 02/07/2021   Acute renal failure superimposed on stage 3a chronic kidney disease (HCC) 02/07/2021   HLD (hyperlipidemia) 02/07/2021   Anxiety 02/07/2021   Fall at home, initial encounter 02/07/2021   Hyperkalemia 02/07/2021   Elevated lactic acid level 02/07/2021   Type II diabetes mellitus with renal manifestations (HCC) 02/07/2021   Hyperglycemia    History of bladder cancer 09/05/2019   BMI 40.0-44.9, adult (HCC) 03/02/2017   Smoking 03/02/2017   Left-sided low back pain with left-sided sciatica 11/08/2014   Lumbar radiculopathy 11/08/2014   DDD (degenerative disc disease), lumbosacral 11/08/2014   Hip pain, chronic 11/08/2014   Sacro-iliac pain 11/08/2014    Essential hypertension 11/08/2014   Diabetes mellitus due to underlying condition with diabetic neuropathy (HCC) 11/08/2014   DDD (degenerative disc disease), cervical 11/08/2014   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   Incomplete bladder emptying 05/23/2013   Elevated prostate specific antigen (PSA) 04/12/2012   Benign prostatic hyperplasia with urinary obstruction 01/14/2012   Decreased libido 01/14/2012   Other long term (current) drug therapy 01/14/2012   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   Flu vaccine need 01/14/2012   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   Encounter for long-term (current) use of medications 01/14/2012    Orientation RESPIRATION BLADDER Height & Weight     Self, Time, Situation, Place  Normal Continent Weight: 112.9 kg Height:  5\' 11"  (180.3 cm)  BEHAVIORAL SYMPTOMS/MOOD NEUROLOGICAL BOWEL NUTRITION STATUS      Continent Diet (see DC summary)  AMBULATORY STATUS COMMUNICATION OF NEEDS Skin   Extensive Assist Verbally Normal, Surgical wounds                       Personal Care Assistance Level of Assistance  Bathing, Feeding, Dressing Bathing Assistance: Limited assistance Feeding assistance: Limited assistance Dressing Assistance: Maximum assistance     Functional Limitations Info  Sight, Hearing, Speech Sight Info: Adequate Hearing Info: Adequate Speech Info: Adequate    SPECIAL CARE FACTORS FREQUENCY  PT (By licensed PT), OT (By licensed OT)     PT Frequency: 5 times per week OT Frequency: 5 times per week            Contractures Contractures Info: Not present  Additional Factors Info  Code Status, Allergies Code Status Info: full code Allergies Info: NSAIDs           Current Medications (01/12/2023):  This is the current hospital active medication list Current Facility-Administered Medications  Medication Dose Route Frequency Provider Last Rate Last Admin   acetaminophen (TYLENOL) tablet 650 mg  650 mg  Oral Q6H PRN Dew, Marlow Baars, MD       ALPRAZolam Prudy Feeler) tablet 1 mg  1 mg Oral QID PRN Annice Needy, MD   1 mg at 01/10/23 2352   baclofen (LIORESAL) tablet 10 mg  10 mg Oral BID Annice Needy, MD   10 mg at 01/12/23 0347   Chlorhexidine Gluconate Cloth 2 % PADS 6 each  6 each Topical Once Pace, Huel Cote R, NP       empagliflozin (JARDIANCE) tablet 25 mg  25 mg Oral Daily Annice Needy, MD   25 mg at 01/12/23 1106   enoxaparin (LOVENOX) injection 55 mg  55 mg Subcutaneous Q24H Annice Needy, MD   55 mg at 01/11/23 2132   furosemide (LASIX) tablet 20 mg  20 mg Oral Daily Annice Needy, MD   20 mg at 01/12/23 4259   hydrALAZINE (APRESOLINE) injection 10 mg  10 mg Intravenous Q4H PRN Amin, Ankit C, MD       HYDROmorphone (DILAUDID) injection 0.5-1 mg  0.5-1 mg Intravenous Q2H PRN Annice Needy, MD   1 mg at 01/12/23 0839   insulin aspart (novoLOG) injection 0-15 Units  0-15 Units Subcutaneous TID WC Annice Needy, MD   2 Units at 01/12/23 0839   insulin aspart (novoLOG) injection 0-5 Units  0-5 Units Subcutaneous QHS Annice Needy, MD   4 Units at 01/11/23 2131   insulin glargine-yfgn (SEMGLEE) injection 30 Units  30 Units Subcutaneous QHS Annice Needy, MD   30 Units at 01/11/23 2130   ipratropium-albuterol (DUONEB) 0.5-2.5 (3) MG/3ML nebulizer solution 3 mL  3 mL Nebulization Q4H PRN Amin, Ankit C, MD       metoprolol tartrate (LOPRESSOR) injection 5 mg  5 mg Intravenous Q4H PRN Amin, Ankit C, MD       nicotine (NICODERM CQ - dosed in mg/24 hours) patch 21 mg  21 mg Transdermal Daily Dew, Marlow Baars, MD       ondansetron (ZOFRAN) tablet 4 mg  4 mg Oral Q6H PRN Annice Needy, MD       Or   ondansetron (ZOFRAN) injection 4 mg  4 mg Intravenous Q6H PRN Annice Needy, MD       oxyCODONE (Oxy IR/ROXICODONE) immediate release tablet 20 mg  20 mg Oral QID PRN Annice Needy, MD   20 mg at 01/11/23 2121   senna-docusate (Senokot-S) tablet 1 tablet  1 tablet Oral QHS PRN Amin, Ankit C, MD       simvastatin (ZOCOR) tablet 40  mg  40 mg Oral Daily Annice Needy, MD   40 mg at 01/12/23 5638     Discharge Medications: Please see discharge summary for a list of discharge medications.  Relevant Imaging Results:  Relevant Lab Results:   Additional Information [id number]  Marlowe Sax, RN

## 2023-01-12 NOTE — Progress Notes (Signed)
Occupational Therapy Treatment Patient Details Name: Corey Foster MRN: [id number] DOB: 09/24/1955 Today's Date: 01/12/2023   History of present illness Pt is a 67 y.o. 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   OT comments  Pt seen for OT Tx. Pt endorses feeling fatigued and declining EOB or OOB ADL session. Pt agreeable to bed level. Pt able to return demo good technique for residual limb knee exercises without additional cues. Instructed in desensitization strategies to support pain mgt and to support long term recovery and future prosthesis wear. Pt verbalized understanding. Pt continues to benefit from skilled OT services.       If plan is discharge home, recommend the following:  A little help with walking and/or transfers;A little help with bathing/dressing/bathroom;Help with stairs or ramp for entrance   Equipment Recommendations  BSC/3in1    Recommendations for Other Services      Precautions / Restrictions Precautions Precautions: Fall Restrictions Weight Bearing Restrictions: Yes LLE Weight Bearing: Non weight bearing       Mobility Bed Mobility               General bed mobility comments: pt declined    Transfers                         Balance                                           ADL either performed or assessed with clinical judgement   ADL Overall ADL's : Needs assistance/impaired                                            Extremity/Trunk Assessment              Vision       Perception     Praxis      Cognition  Arousal: Alert Behavior During Therapy: WFL for tasks assessed/performed Overall Cognitive Status: Within Functional Limits for tasks assessed                                          Exercises Other Exercises Other Exercises: Pt able to return demo good technique for residual limb knee exercises without additional cues. Instructed in desensitization strategies to support pain mgt and to support long term recovery and future prosthesis wear    Shoulder Instructions       General Comments      Pertinent Vitals/ Pain       Pain Assessment Pain Assessment: Faces Faces Pain Scale: Hurts even more Pain Location: L residual limb Pain Descriptors / Indicators: Discomfort, Grimacing, Aching Pain Intervention(s): Monitored during session, Premedicated before session, Repositioned  Home Living  Prior Functioning/Environment              Frequency  Min 1X/week        Progress Toward Goals  OT Goals(current goals can now be found in the care plan section)  Progress towards OT goals: Progressing toward goals  Acute Rehab OT Goals Patient Stated Goal: go home OT Goal Formulation: With patient Time For Goal Achievement: 01/23/23 Potential to Achieve Goals: Good  Plan      Co-evaluation                 AM-PAC OT "6 Clicks" Daily Activity     Outcome Measure   Help from another person eating meals?: None Help from another person taking care of personal grooming?: A Little Help from another person toileting, which includes using toliet, bedpan, or urinal?: A Little Help from another person bathing (including washing, rinsing, drying)?: A Little Help from another person to put on and taking off regular upper body clothing?: A Little Help from another person to put on and taking off regular lower body clothing?: A Little 6 Click Score: 19    End of Session    OT Visit Diagnosis: Other  abnormalities of gait and mobility (R26.89);Muscle weakness (generalized) (M62.81)   Activity Tolerance Patient limited by fatigue   Patient Left in bed;with call bell/phone within reach;with bed alarm set   Nurse Communication          Time: 4696-2952 OT Time Calculation (min): 10 min  Charges: OT General Charges $OT Visit: 1 Visit OT Treatments $Therapeutic Activity: 8-22 mins  Arman Filter., MPH, MS, OTR/L ascom [phone] 01/12/23, 1:27 PM

## 2023-01-12 NOTE — Progress Notes (Signed)
PT Cancellation Note  Patient Details Name: KEONDRICK DANDY MRN: [id number] DOB: 12/28/1955   Cancelled Treatment:     Pt received in bed, states he sat EOB earlier today while working with OT. Pt with c/o fatigue, politely declines mobility with PT. Will plan to re-attempt next available date/time per POC. Pt awaiting STR placement as recommended.    Jannet Askew 01/12/2023, 2:51 PM

## 2023-01-12 NOTE — Progress Notes (Signed)
[facility] Progress Note  Patient: Corey Foster    ZOX:[id number]  DOA: 01/07/2023     Date of Service: the patient was seen and examined on 01/12/2023  Chief Complaint  Patient presents with   Foot Problem   Brief hospital course: 67 y.o. 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  Postop PT/OT recommended SNF   Assessment and Plan:  Principal Problem:   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) Active Problems:   Dry gangrene (HCC)   Critical limb ischemia of left lower extremity (HCC)    Chronic left diabetic foot infection with dry gangrene like changes Status post left-sided BKA Seen by vascular.  Nonhealing ulcer.  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  Length of antibiotic and dressing changes per Vasc.  Plan is to change dressing today Started Lyrica 75 mg every evening   IDDM with hyperglycemia On long-acting insulin.  Sliding scale and Accu-Cheks.   Mild Renal Insuff -Cr Fluctuating between 0.9-1.3. Keep monitoring. Encouraging oral hydration.    PVD with recent left tibial artery angioplasty Continue aspirin and statin   Cigarette smoking Counseled to using   PT/OT-SNF  Body mass index is 34.71 kg/m.  Interventions:  Diet: Carb modified diet DVT Prophylaxis: Subcutaneous Lovenox   Advance goals of care discussion: Full code  Family Communication: family was not present at bedside, at the time of interview.  The pt provided permission to discuss medical plan with the family. Opportunity was given to ask question and all questions were answered satisfactorily.   Disposition:   Pt is from home, admitted with left foot ulcer s/p left BKA, clinically stable, medically optimized to discharge.  PT OT eval done recommend SNF placement. Follow TOC for placement.  Subjective: No significant events overnight, patient still has pain in the left BKA stump 8/10, lack of sleep.  No any other complaints.  Physical Exam: General: NAD, lying comfortably Appear in no distress, affect appropriate Eyes: PERRLA ENT: Oral Mucosa Clear, moist  Neck: no JVD,  Cardiovascular: S1 and S2 Present, no Murmur,  Respiratory: good respiratory effort, Bilateral Air entry equal and Decreased, no Crackles, no wheezes Abdomen: Bowel Sound present, Soft and no tenderness,  Skin: no rashes Extremities: no Pedal edema, no calf tenderness, s/p L BKA Neurologic: without any new focal findings Gait not checked due to patient safety concerns  Vitals:   01/11/23 1711 01/12/23 0116 01/12/23 0742 01/12/23 1403  BP: 112/66 136/78 130/78 116/80  Pulse: 98 95 95 88  Resp: 18 20 18 18   Temp: 99.3 F (37.4 C) 97.8 F (36.6 C) 98.4 F (36.9 C) 98.6 F (37 C)  TempSrc:    Oral  SpO2: 97% 99% 100% 100%  Weight:      Height:        Intake/Output Summary (Last 24 hours) at 01/12/2023 1456 Last data filed at 01/12/2023 1429 Gross per 24 hour  Intake 240 ml  Output 2300 ml  Net -2060 ml   Filed Weights   01/07/23 1512 01/09/23 0707  Weight: 112.9 kg 112.9 kg    Data Reviewed: I have personally reviewed and interpreted daily labs, tele strips, imagings as discussed above.  I reviewed all nursing notes, pharmacy notes, vitals, pertinent old records I have discussed plan of care as described above with RN and patient/family.  CBC: Recent Labs  Lab 01/07/23 1638 01/09/23 0555 01/10/23 0416 01/11/23 0610 01/12/23 0416  WBC 7.8 8.1 9.4 10.5 8.1  NEUTROABS 5.5  --   --   --   --   HGB 15.1 15.0 13.3 11.7* 11.9*  HCT 44.6 44.2 39.8 35.0* 35.6*  MCV 94.1 93.6 95.9 94.3 94.9  PLT 263 271  231 192 200   Basic Metabolic Panel: Recent Labs  Lab 01/07/23 1638 01/09/23 0555 01/10/23 0416 01/11/23 0610 01/12/23 0416  NA 139 138 138 134* 137  K 4.1 4.3 4.7 4.3 4.5  CL 106 106 105 100 101  CO2 24 23 25 26 27   GLUCOSE 235* 51* 136* 155* 162*  BUN 20 13 13 19 19   CREATININE 1.24 0.90 0.95 1.31* 1.21  CALCIUM 8.7* 8.5* 8.3* 8.1* 8.4*  MG  --  2.4 2.3 2.4 2.6*  PHOS  --  3.3 2.7  --   --     Studies: No results found.  Scheduled Meds:  baclofen  10 mg Oral BID   Chlorhexidine Gluconate Cloth  6 each Topical Once   empagliflozin  25 mg Oral Daily   enoxaparin (LOVENOX) injection  55 mg Subcutaneous Q24H   furosemide  20 mg Oral Daily   insulin aspart  0-15 Units Subcutaneous TID WC   insulin aspart  0-5 Units Subcutaneous QHS   insulin glargine-yfgn  30 Units Subcutaneous QHS   nicotine  21 mg Transdermal Daily   simvastatin  40 mg Oral Daily   Continuous Infusions: PRN Meds: acetaminophen, ALPRAZolam, hydrALAZINE, HYDROmorphone (DILAUDID) injection, ipratropium-albuterol, metoprolol tartrate, ondansetron **OR** ondansetron (ZOFRAN) IV, oxyCODONE, senna-docusate  Time spent: 35 minutes  Author: Gillis Santa. MD [facility] 01/12/2023 2:56 PM  To reach On-call, see care teams to locate the attending and reach out to them via [email or web address]. If 7PM-7AM, please contact night-coverage If you still have difficulty reaching the attending provider, please page the [facility] (Director on Call) for [facility] on amion for assistance.

## 2023-01-13 DIAGNOSIS — E11621 Type 2 diabetes mellitus with foot ulcer: Secondary | ICD-10-CM | POA: Diagnosis not present

## 2023-01-13 DIAGNOSIS — E1152 Type 2 diabetes mellitus with diabetic peripheral angiopathy with gangrene: Secondary | ICD-10-CM | POA: Diagnosis not present

## 2023-01-13 DIAGNOSIS — L97509 Non-pressure chronic ulcer of other part of unspecified foot with unspecified severity: Secondary | ICD-10-CM | POA: Diagnosis not present

## 2023-01-13 LAB — CBC
HCT: 35.3 % — ABNORMAL LOW (ref 39.0–52.0)
HCT: 35.2 % — ABNORMAL LOW (ref 39.0–52.0)
Hemoglobin: 12 g/dL — ABNORMAL LOW (ref 13.0–17.0)
Hemoglobin: 11.8 g/dL — ABNORMAL LOW (ref 13.0–17.0)
MCH: 32.3 pg (ref 26.0–34.0)
MCH: 32.2 pg (ref 26.0–34.0)
MCHC: 34 g/dL (ref 30.0–36.0)
MCHC: 33.5 g/dL (ref 30.0–36.0)
MCV: 94.9 fL (ref 80.0–100.0)
MCV: 96.2 fL (ref 80.0–100.0)
Platelets: 216 10*3/uL (ref 150–400)
Platelets: 223 10*3/uL (ref 150–400)
RBC: 3.72 MIL/uL — ABNORMAL LOW (ref 4.22–5.81)
RBC: 3.66 MIL/uL — ABNORMAL LOW (ref 4.22–5.81)
RDW: 13.9 % (ref 11.5–15.5)
RDW: 14 % (ref 11.5–15.5)
WBC: 6.9 10*3/uL (ref 4.0–10.5)
WBC: 6.9 10*3/uL (ref 4.0–10.5)
nRBC: 0 % (ref 0.0–0.2)
nRBC: 0 % (ref 0.0–0.2)

## 2023-01-13 LAB — GLUCOSE, CAPILLARY
Glucose-Capillary: 133 mg/dL — ABNORMAL HIGH (ref 70–99)
Glucose-Capillary: 204 mg/dL — ABNORMAL HIGH (ref 70–99)
Glucose-Capillary: 241 mg/dL — ABNORMAL HIGH (ref 70–99)
Glucose-Capillary: 159 mg/dL — ABNORMAL HIGH (ref 70–99)

## 2023-01-13 LAB — BASIC METABOLIC PANEL
Anion gap: 6 (ref 5–15)
BUN: 18 mg/dL (ref 8–23)
CO2: 30 mmol/L (ref 22–32)
Calcium: 8.2 mg/dL — ABNORMAL LOW (ref 8.9–10.3)
Chloride: 103 mmol/L (ref 98–111)
Creatinine, Ser: 1.35 mg/dL — ABNORMAL HIGH (ref 0.61–1.24)
GFR, Estimated: 58 mL/min — ABNORMAL LOW (ref >60)
Glucose, Bld: 156 mg/dL — ABNORMAL HIGH (ref 70–99)
Potassium: 4.4 mmol/L (ref 3.5–5.1)
Sodium: 139 mmol/L (ref 135–145)

## 2023-01-13 LAB — MAGNESIUM: Magnesium: 2.8 mg/dL — ABNORMAL HIGH (ref 1.7–2.4)

## 2023-01-13 MED ORDER — POLYETHYLENE GLYCOL 3350 17 G PO PACK
17.0000 g | PACK | Freq: Two times a day (BID) | ORAL | Status: DC
  Administered 2023-01-13 – 2023-01-14 (×2): 17 g via ORAL
  Filled 2023-01-13 (×2): qty 1

## 2023-01-13 MED ORDER — BISACODYL 5 MG PO TBEC
10.0000 mg | DELAYED_RELEASE_TABLET | Freq: Every day | ORAL | Status: DC
  Administered 2023-01-13: 10 mg via ORAL
  Filled 2023-01-13: qty 2

## 2023-01-13 MED ORDER — BISACODYL 10 MG RE SUPP: 10.0000 mg | Freq: Every day | RECTAL | Status: DC | PRN

## 2023-01-13 NOTE — TOC Progression Note (Signed)
Transition of Care [facility]) - Progression Note    Patient Details  Name: PRYCE FLY MRN: [id number] Date of Birth: 1956/01/16  Transition of [facility]) CM/SW Contact  Marlowe Sax, RN Phone Number: 01/13/2023, 11:35 AM  Clinical Narrative:    Met with the patient to review the bed offers, he chose [facility], I notified [facility], Ins pending   Expected Discharge Plan: Skilled Nursing Facility Barriers to Discharge: SNF Pending bed offer, Insurance Authorization  Expected Discharge Plan and Services   Discharge Planning Services: CM Consult   Living arrangements for the past 2 months: Single Family Home                   DME Agency: NA       HH Arranged: NA           Social Determinants of Health (SDOH) Interventions SDOH Screenings   Food Insecurity: No Food Insecurity (01/07/2023)  Housing: Low Risk  (01/07/2023)  Transportation Needs: No Transportation Needs (01/07/2023)  Utilities: Not At Risk (01/07/2023)  Tobacco Use: High Risk (01/09/2023)    Readmission Risk Interventions     No data to display

## 2023-01-13 NOTE — Progress Notes (Signed)
Progress Note    01/13/2023 9:41 AM 4 Days Post-Op  Subjective:   Corey Foster is a 67 yo male who is now POD #3 from Left BKA.  He is recovering as expected.  He does not endorse any pain of the left lower extremity and stump today.  I changed the dressing yesterday.  No signs or symptoms of infection hematoma seroma to note.  Patient continues to work with PT OT.  No complaints overnight.  Vitals all remained stable.  Patient agrees to go to SNF when bed available.    Vitals:   01/12/23 2324 01/13/23 0743  BP: 117/72 100/87  Pulse: 93 86  Resp: 17 16  Temp: 98.4 F (36.9 C) 98.2 F (36.8 C)  SpO2: 99% 96%   Physical Exam: Cardiac:  RRR, normal S1 and S2.  No murmurs. Lungs: Nonlabored breathing.  Clear on auscultation throughout.  No rales rhonchi or wheezing. Incisions: Left lower extremity BKA incision.  Dressing changed today.  No signs or symptoms of infection, hematoma or seroma to note.  Healing well. Extremities: Left BKA with dressing clean dry and intact.  Changed today.  Right lower extremity warm to touch with palpable DP and femoral pulses. Abdomen: Positive bowel sounds throughout, soft, nondistended nontender. Neurologic: Alert to person place and time, answers all questions and follows commands appropriately.  CBC    Component Value Date/Time   WBC 6.9 01/13/2023 0545   RBC 3.72 (L) 01/13/2023 0545   HGB 12.0 (L) 01/13/2023 0545   HGB 16.4 08/02/2013 0916   HCT 35.3 (L) 01/13/2023 0545   HCT 47.1 03/27/2020 1012   PLT 216 01/13/2023 0545   PLT 205 08/02/2013 0916   MCV 94.9 01/13/2023 0545   MCV 94 08/02/2013 0916   MCH 32.3 01/13/2023 0545   MCHC 34.0 01/13/2023 0545   RDW 13.9 01/13/2023 0545   RDW 15.5 (H) 08/02/2013 0916   LYMPHSABS 1.4 01/07/2023 1638   LYMPHSABS 2.1 08/02/2013 0916   MONOABS 0.7 01/07/2023 1638   MONOABS 1.2 (H) 08/02/2013 0916   EOSABS 0.1 01/07/2023 1638   EOSABS 0.1 08/02/2013 0916   BASOSABS 0.0 01/07/2023 1638    BASOSABS 0.1 08/02/2013 0916    BMET    Component Value Date/Time   NA 139 01/13/2023 0545   K 4.4 01/13/2023 0545   CL 103 01/13/2023 0545   CO2 30 01/13/2023 0545   GLUCOSE 156 (H) 01/13/2023 0545   BUN 18 01/13/2023 0545   CREATININE 1.35 (H) 01/13/2023 0545   CALCIUM 8.2 (L) 01/13/2023 0545   GFRNONAA 58 (L) 01/13/2023 0545   GFRAA 44 (L) 04/15/2019 1432    INR    Component Value Date/Time   INR 1.0 12/17/2022 1647     Intake/Output Summary (Last 24 hours) at 01/13/2023 0941 Last data filed at 01/13/2023 0500 Gross per 24 hour  Intake 240 ml  Output 2200 ml  Net -1960 ml     Assessment/Plan:  66 y.o. male is s/p left lower extremity BKA  4 Days Post-Op   PLAN: Pain medication as needed Continue to work with PT/OT. Dressing changed today to BKA.  Daily dressing changes as follows. Remove the old dressing. Xeroform strip gauze over staple line. Cover with 4 x 4's. Cover with ABD pads. Wrap with Kerlix x 2. Wrapped snugly with 6 inch Ace bandage. Change daily.  Okay to discharge to this SNF/rehab when medically appropriate. Patient to follow-up with vein and vascular clinic for staple removal in 3  weeks.  DVT prophylaxis:  Lovenox 55 mg SQ every 24 hours.    Marcie Bal [facility] 01/13/2023 9:41 AM

## 2023-01-13 NOTE — Plan of Care (Signed)

## 2023-01-13 NOTE — Progress Notes (Signed)
[facility] Progress Note  Patient: Corey Foster    ZOX:[id number]  DOA: 01/07/2023     Date of Service: the patient was seen and examined on 01/13/2023  Chief Complaint  Patient presents with   Foot Problem   Brief hospital course: 67 y.o. 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  Postop PT/OT recommended SNF   Assessment and Plan:  Principal Problem:   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) Active Problems:   Dry gangrene (HCC)   Critical limb ischemia of left lower extremity (HCC)    Chronic left diabetic foot infection with dry gangrene like changes Status post left-sided BKA Seen by vascular.  Nonhealing ulcer.  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  Length of antibiotic and dressing changes per Vasc.  Plan is to change dressing today Started Lyrica 75 mg every evening   IDDM with hyperglycemia On long-acting insulin.  Sliding scale and Accu-Cheks.   Mild Renal Insuff -Cr Fluctuating between 0.9-1.3. Keep monitoring. Encouraging oral hydration.  11/19 discontinued Lasix, patient is euvolemic.   PVD with recent left tibial artery angioplasty Continue aspirin and statin    Cigarette smoking Counseled to using   PT/OT-SNF  Body mass index is 34.71 kg/m.  Interventions:  Diet: Carb modified diet DVT Prophylaxis: Subcutaneous Lovenox   Advance goals of care discussion: Full code  Family Communication: family was not present at bedside, at the time of interview.  The pt provided permission to discuss medical plan with the family. Opportunity was given to ask question and all  questions were answered satisfactorily.   Disposition:  Pt is from home, admitted with left foot ulcer s/p left BKA, clinically stable, medically optimized to discharge.  PT OT eval done recommend SNF placement. Follow TOC for placement.  Subjective: No significant events overnight, patient still has pain in the left BKA stump 8/10, but patient did sleep well last night.  No any other complaints.   Physical Exam: General: NAD, lying comfortably Appear in no distress, affect appropriate Eyes: PERRLA ENT: Oral Mucosa Clear, moist  Neck: no JVD,  Cardiovascular: S1 and S2 Present, no Murmur,  Respiratory: good respiratory effort, Bilateral Air entry equal and Decreased, no Crackles, no wheezes Abdomen: Bowel Sound present, Soft and no tenderness,  Skin: no rashes Extremities: no Pedal edema, no calf tenderness, s/p L BKA Neurologic: without any new focal findings Gait not checked due to patient safety concerns  Vitals:   01/12/23 0742 01/12/23 1403 01/12/23 2324 01/13/23 0743  BP: 130/78 116/80 117/72 100/87  Pulse: 95 88 93 86  Resp: 18 18 17 16   Temp: 98.4 F (36.9 C) 98.6 F (37 C) 98.4 F (36.9 C) 98.2 F (36.8 C)  TempSrc:  Oral  Oral  SpO2: 100% 100% 99% 96%  Weight:      Height:        Intake/Output Summary (Last 24 hours) at 01/13/2023 1109 Last data filed at 01/13/2023 0900 Gross per 24 hour  Intake 480 ml  Output 2200 ml  Net -1720 ml   Filed Weights   01/07/23 1512 01/09/23 0707  Weight: 112.9 kg 112.9 kg    Data Reviewed: I have  personally reviewed and interpreted daily labs, tele strips, imagings as discussed above. I reviewed all nursing notes, pharmacy notes, vitals, pertinent old records I have discussed plan of care as described above with RN and patient/family.  CBC: Recent Labs  Lab 01/07/23 1638 01/09/23 0555 01/10/23 0416 01/11/23 0610 01/12/23 0416 01/13/23 0545  WBC 7.8 8.1 9.4 10.5 8.1 6.9  NEUTROABS 5.5  --   --   --   --   --    HGB 15.1 15.0 13.3 11.7* 11.9* 12.0*  HCT 44.6 44.2 39.8 35.0* 35.6* 35.3*  MCV 94.1 93.6 95.9 94.3 94.9 94.9  PLT 263 271 231 192 200 216   Basic Metabolic Panel: Recent Labs  Lab 01/09/23 0555 01/10/23 0416 01/11/23 0610 01/12/23 0416 01/13/23 0545  NA 138 138 134* 137 139  K 4.3 4.7 4.3 4.5 4.4  CL 106 105 100 101 103  CO2 23 25 26 27 30   GLUCOSE 51* 136* 155* 162* 156*  BUN 13 13 19 19 18   CREATININE 0.90 0.95 1.31* 1.21 1.35*  CALCIUM 8.5* 8.3* 8.1* 8.4* 8.2*  MG 2.4 2.3 2.4 2.6* 2.8*  PHOS 3.3 2.7  --   --   --     Studies: No results found.  Scheduled Meds:  baclofen  10 mg Oral BID   Chlorhexidine Gluconate Cloth  6 each Topical Once   empagliflozin  25 mg Oral Daily   enoxaparin (LOVENOX) injection  55 mg Subcutaneous Q24H   insulin aspart  0-15 Units Subcutaneous TID WC   insulin aspart  0-5 Units Subcutaneous QHS   insulin glargine-yfgn  30 Units Subcutaneous QHS   nicotine  21 mg Transdermal Daily   pregabalin  75 mg Oral QPM   simvastatin  40 mg Oral Daily   Continuous Infusions: PRN Meds: acetaminophen, ALPRAZolam, hydrALAZINE, HYDROmorphone (DILAUDID) injection, ipratropium-albuterol, metoprolol tartrate, ondansetron **OR** ondansetron (ZOFRAN) IV, oxyCODONE, senna-docusate, traZODone  Time spent: 35 minutes  Author: Gillis Santa. MD [facility] 01/13/2023 11:09 AM  To reach On-call, see care teams to locate the attending and reach out to them via [email or web address]. If 7PM-7AM, please contact night-coverage If you still have difficulty reaching the attending provider, please page the [facility] (Director on Call) for [facility] on amion for assistance.

## 2023-01-13 NOTE — Progress Notes (Signed)
PHARMACY CONSULT NOTE - FOLLOW UP  Pharmacy Consult for Electrolyte Monitoring and Replacement   Recent Labs: Potassium (mmol/L)  Date Value  01/13/2023 4.4   Magnesium (mg/dL)  Date Value  16/11/9602 2.8 (H)   Calcium (mg/dL)  Date Value  54/10/8117 8.2 (L)   Albumin (g/dL)  Date Value  14/78/2956 3.2 (L)   Phosphorus (mg/dL)  Date Value  21/30/8657 2.7   Sodium (mmol/L)  Date Value  01/13/2023 139     Assessment: 67 year old male admitted with nonhealing foot wound. PMH DM, HTN, HLD.  Pertinent medications: furosemide 20mg  po daily  Goal of Therapy:  Electrolytes within normal limits  Plan:  No replacement warranted Follow up electrolytes tomorrow AM   Elliot Gurney, PharmD, BCPS Clinical Pharmacist  01/13/2023 8:29 AM

## 2023-01-14 DIAGNOSIS — E1152 Type 2 diabetes mellitus with diabetic peripheral angiopathy with gangrene: Secondary | ICD-10-CM | POA: Diagnosis not present

## 2023-01-14 DIAGNOSIS — L97509 Non-pressure chronic ulcer of other part of unspecified foot with unspecified severity: Secondary | ICD-10-CM | POA: Diagnosis not present

## 2023-01-14 DIAGNOSIS — E11621 Type 2 diabetes mellitus with foot ulcer: Secondary | ICD-10-CM | POA: Diagnosis not present

## 2023-01-14 LAB — CBC
HCT: 35.8 % — ABNORMAL LOW (ref 39.0–52.0)
Hemoglobin: 11.8 g/dL — ABNORMAL LOW (ref 13.0–17.0)
MCH: 32 pg (ref 26.0–34.0)
MCHC: 33 g/dL (ref 30.0–36.0)
MCV: 97 fL (ref 80.0–100.0)
Platelets: 220 10*3/uL (ref 150–400)
RBC: 3.69 MIL/uL — ABNORMAL LOW (ref 4.22–5.81)
RDW: 13.8 % (ref 11.5–15.5)
WBC: 6.2 10*3/uL (ref 4.0–10.5)
nRBC: 0 % (ref 0.0–0.2)

## 2023-01-14 LAB — BASIC METABOLIC PANEL
Anion gap: 7 (ref 5–15)
BUN: 22 mg/dL (ref 8–23)
CO2: 29 mmol/L (ref 22–32)
Calcium: 8.5 mg/dL — ABNORMAL LOW (ref 8.9–10.3)
Chloride: 104 mmol/L (ref 98–111)
Creatinine, Ser: 1.26 mg/dL — ABNORMAL HIGH (ref 0.61–1.24)
GFR, Estimated: >60 mL/min (ref >60)
Glucose, Bld: 162 mg/dL — ABNORMAL HIGH (ref 70–99)
Potassium: 4.5 mmol/L (ref 3.5–5.1)
Sodium: 140 mmol/L (ref 135–145)

## 2023-01-14 LAB — MAGNESIUM: Magnesium: 2.7 mg/dL — ABNORMAL HIGH (ref 1.7–2.4)

## 2023-01-14 LAB — GLUCOSE, CAPILLARY
Glucose-Capillary: 192 mg/dL — ABNORMAL HIGH (ref 70–99)
Glucose-Capillary: 177 mg/dL — ABNORMAL HIGH (ref 70–99)
Glucose-Capillary: 140 mg/dL — ABNORMAL HIGH (ref 70–99)
Glucose-Capillary: 200 mg/dL — ABNORMAL HIGH (ref 70–99)

## 2023-01-14 MED ORDER — OXYCODONE HCL 5 MG PO TABS: 5.0000 mg | ORAL_TABLET | Freq: Four times a day (QID) | ORAL | 0 refills | Status: DC | PRN

## 2023-01-14 MED ORDER — POLYETHYLENE GLYCOL 3350 17 G PO PACK: 17.0000 g | PACK | Freq: Two times a day (BID) | ORAL | Status: AC

## 2023-01-14 MED ORDER — BISACODYL 10 MG RE SUPP: 10.0000 mg | Freq: Every day | RECTAL | Status: AC | PRN

## 2023-01-14 MED ORDER — BISACODYL 5 MG PO TBEC: 10.0000 mg | DELAYED_RELEASE_TABLET | Freq: Every day | ORAL | Status: AC

## 2023-01-14 MED ORDER — FUROSEMIDE 20 MG PO TABS: 20.0000 mg | ORAL_TABLET | Freq: Every day | ORAL | Status: DC | PRN

## 2023-01-14 MED ORDER — PREGABALIN 75 MG PO CAPS: 75.0000 mg | ORAL_CAPSULE | Freq: Every evening | ORAL | 5 refills | Status: DC

## 2023-01-14 MED ORDER — ALPRAZOLAM 1 MG PO TABS: 1.0000 mg | ORAL_TABLET | Freq: Three times a day (TID) | ORAL | 0 refills | Status: AC | PRN

## 2023-01-14 NOTE — Plan of Care (Signed)
  Problem: Coping: Goal: Ability to adjust to condition or change in health will improve Outcome: Progressing   Problem: Skin Integrity: Goal: Risk for impaired skin integrity will decrease Outcome: Progressing   Problem: Education: Goal: Knowledge of General Education information will improve Description: Including pain rating scale, medication(s)/side effects and non-pharmacologic comfort measures Outcome: Progressing   Problem: Health Behavior/Discharge Planning: Goal: Ability to manage health-related needs will improve Outcome: Progressing   Problem: Activity: Goal: Risk for activity intolerance will decrease Outcome: Progressing   Problem: Nutrition: Goal: Adequate nutrition will be maintained Outcome: Progressing   Problem: Elimination: Goal: Will not experience complications related to bowel motility Outcome: Progressing Goal: Will not experience complications related to urinary retention Outcome: Progressing   Problem: Pain Management: Goal: General experience of comfort will improve Outcome: Progressing   Problem: Safety: Goal: Ability to remain free from injury will improve Outcome: Progressing   Problem: Skin Integrity: Goal: Risk for impaired skin integrity will decrease Outcome: Progressing

## 2023-01-14 NOTE — Progress Notes (Signed)
PHARMACY CONSULT NOTE - FOLLOW UP  Pharmacy Consult for Electrolyte Monitoring and Replacement   Recent Labs: Potassium (mmol/L)  Date Value  01/14/2023 4.5   Magnesium (mg/dL)  Date Value  16/11/9602 2.7 (H)   Calcium (mg/dL)  Date Value  54/10/8117 8.5 (L)   Albumin (g/dL)  Date Value  14/78/2956 3.2 (L)   Phosphorus (mg/dL)  Date Value  21/30/8657 2.7   Sodium (mmol/L)  Date Value  01/14/2023 140     Assessment: 67 year old male admitted with nonhealing foot wound. PMH DM, HTN, HLD.  Goal of Therapy:  Electrolytes within normal limits  Plan:  No replacement warranted Follow up electrolytes tomorrow AM   Elliot Gurney, PharmD, BCPS Clinical Pharmacist  01/14/2023 7:37 AM

## 2023-01-14 NOTE — TOC Progression Note (Signed)
Transition of Care [facility]) - Progression Note    Patient Details  Name: Corey Foster MRN: [id number] Date of Birth: 10/18/1955  [facility]) CM/SW Contact  Marlowe Sax, RN Phone Number: 01/14/2023, 2:08 PM  Clinical Narrative:     The ins approved the patient to go to [facility] and they can accept him today if the DC summary is done soon  Expected Discharge Plan: Skilled Nursing Facility Barriers to Discharge: SNF Pending bed offer, Insurance Authorization  Expected Discharge Plan and Services   Discharge Planning Services: CM Consult   Living arrangements for the past 2 months: Single Family Home                   DME Agency: NA       HH Arranged: NA           Social Determinants of Health (SDOH) Interventions SDOH Screenings   Food Insecurity: No Food Insecurity (01/07/2023)  Housing: Low Risk  (01/07/2023)  Transportation Needs: No Transportation Needs (01/07/2023)  Utilities: Not At Risk (01/07/2023)  Tobacco Use: High Risk (01/09/2023)    Readmission Risk Interventions     No data to display

## 2023-01-14 NOTE — Care Management Important Message (Signed)
Important Message  Patient Details  Name: Corey Foster MRN: [id number] Date of Birth: [birth year]   Important Message Given:  Yes - Medicare IM     Corey Foster 01/14/2023, 2:39 PM

## 2023-01-14 NOTE — Progress Notes (Signed)
Physical Therapy Treatment Patient Details Name: Corey Foster MRN: [id number] DOB: [birth year] Today's Date: 01/14/2023   History of Present Illness Pt is a 67 y.o. 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    PT Comments  Pt was pleasant and motivated to participate during the session and put forth good effort throughout. Pt received in chair and able to perform transfers with CGA for safety and cuing for efficiency. Pt ambulated short distance in room with hop-to pattern and CGA, as well as chair follow for safety. Pt was able to get back in bed with Mod I for extra time. Pt reported no adverse symptoms other than pain throughout session, and received medx from RN during session. Pt responded well to mobility as well as education for positioning and exercise. Pt will benefit from continued PT services upon discharge to safely address deficits listed in patient problem list and for eventual return to PLOF given L BKA.    If plan is discharge home, recommend the following: A little help with walking and/or transfers;A little help with bathing/dressing/bathroom;Assistance with cooking/housework;Assist for transportation;Help with stairs or ramp for entrance;Assistance with feeding;Direct supervision/assist for medications management   Can travel by private vehicle     No  Equipment Recommendations       Recommendations for Other Services       Precautions / Restrictions Precautions Precautions: Fall Precaution Comments: L BKA     Mobility  Bed Mobility Overal bed mobility: Modified Independent Bed Mobility: Sit to  Supine       Sit to supine: Modified independent (Device/Increase time)   General bed mobility comments: no cuing needed, extra time to self-adjust    Transfers Overall transfer level: Needs assistance Equipment used: Rolling walker (2 wheels) Transfers: Sit to/from Stand Sit to Stand: Contact guard assist           General transfer comment: mild unsteadiness but capable, some difficulty transitioning hands from armrest>RW, extra time needed, cuing for hand placement, R foot placement, and forward lean    Ambulation/Gait Ambulation/Gait assistance: Contact guard assist Gait Distance (Feet): 6 Feet x1, 3 Feet x1 Assistive device: Rolling walker (2 wheels) Gait Pattern/deviations:  (Hop-To pattern) Gait velocity: decreased     General Gait Details: good ability to manage RW and hop in safe increments with RLE following demonstration, minimal unsteadiness throughout, cuing for sequencing with gait/turns, upright posture, and RW management (did not attempt backward gait)   Stairs             Wheelchair Mobility     Tilt Bed    Modified Rankin (Stroke Patients Only)       Balance Overall balance assessment: Needs assistance Sitting-balance support: Feet supported, No upper extremity supported Sitting balance-Leahy Scale: Normal     Standing balance support: Bilateral upper extremity supported, Reliant on assistive device for balance, During functional activity Standing balance-Leahy Scale: Fair Standing balance comment: req AD due to recent L BKA, minimal instability during dynamic standing activities with RW                            Cognition Arousal: Alert  Behavior During Therapy: WFL for tasks assessed/performed Overall Cognitive Status: Within Functional Limits for tasks assessed                                          Exercises Total Joint Exercises Quad Sets: AROM, Left, 10 reps, Supine Other Exercises Other  Exercises: educated pt on RW sequencing with gait/turns and importance of LLE positioning and exercise to achieve full knee extension, allowing for future prosthesis    General Comments        Pertinent Vitals/Pain Pain Assessment Pain Assessment: 0-10 Pain Score: 8  Pain Location: L residual limb Pain Descriptors / Indicators: Discomfort, Aching Pain Intervention(s): Monitored during session, Patient requesting pain meds-RN notified, RN gave pain meds during session, Repositioned    Home Living                          Prior Function            PT Goals (current goals can now be found in the care plan section) Acute Rehab PT Goals Patient Stated Goal: go home with my daughter PT Goal Formulation: With patient Time For Goal Achievement: 01/23/23 Potential to Achieve Goals: Good Progress towards PT goals: Progressing toward goals    Frequency    Min 1X/week      PT Plan      Co-evaluation              AM-PAC PT "6 Clicks" Mobility   Outcome Measure  Help needed turning from your back to your side while in a flat bed without using bedrails?: None Help needed moving from lying on your back to sitting on the side of a flat bed without using bedrails?: A Little Help needed moving to and from a bed to a chair (including a wheelchair)?: A Little Help needed standing up from a chair using your arms (e.g., wheelchair or bedside chair)?: A Little Help needed to walk in hospital room?: A Little Help needed climbing 3-5 steps with a railing? : A Lot 6 Click Score: 18    End of Session Equipment Utilized During Treatment: Gait belt Activity Tolerance: Patient tolerated treatment well Patient left: in bed per pt request;with call bell/phone within reach;with bed alarm set Nurse Communication: Mobility status PT Visit Diagnosis: Unsteadiness on feet (R26.81);Muscle weakness (generalized) (M62.81);Pain;Other abnormalities of gait and mobility (R26.89) Pain -  Right/Left: Left Pain - part of body: Knee     Time: 1610-9604 PT Time Calculation (min) (ACUTE ONLY): 22 min  Charges:                           Rosiland Oz SPT 01/14/23, 3:38 PM

## 2023-01-14 NOTE — Progress Notes (Signed)
Occupational Therapy Treatment Patient Details Name: Corey Foster MRN: [id number] DOB: 05-19-1955 Today's Date: 01/14/2023   History of present illness Pt is a 67 y.o. 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   OT comments  Corey Foster was seen for OT treatment on this date. Upon arrival to room pt reclined in bed, agreeable to tx. Pt requires CGA + RW for toilet t/f ~6 ft and pericare in standing. Cues for safe RW use. SETUP bathing seated in chair. Pt making good progress toward goals, will continue to follow POC. Discharge recommendation remains appropriate.       If plan is discharge home, recommend the following:  A little help with walking and/or transfers;A little help with bathing/dressing/bathroom;Help with stairs or ramp for entrance   Equipment Recommendations  BSC/3in1    Recommendations for Other Services      Precautions / Restrictions Precautions Precautions: Fall Restrictions Weight Bearing Restrictions: Yes LLE Weight Bearing: Non weight bearing       Mobility Bed Mobility Overal bed mobility: Modified Independent                  Transfers Overall transfer level: Needs assistance Equipment used: Rolling walker (2 wheels) Transfers: Sit to/from Stand Sit to Stand: Contact guard assist           General transfer comment: cues for hand placement and safw RW use     Balance Overall balance assessment: Needs assistance Sitting-balance support: Feet supported, No upper extremity supported Sitting balance-Leahy Scale: Normal     Standing balance support: Single extremity  supported, During functional activity Standing balance-Leahy Scale: Fair                             ADL either performed or assessed with clinical judgement   ADL Overall ADL's : Needs assistance/impaired                                       General ADL Comments: CGA + RW for toilet t/f and pericare in standing      Cognition Arousal: Alert Behavior During Therapy: WFL for tasks assessed/performed Overall Cognitive Status: Within Functional Limits for tasks assessed                                 General Comments: cues for safe t/f                   Pertinent Vitals/ Pain       Pain Assessment Pain Assessment: No/denies pain   Frequency  Min 1X/week        Progress Toward Goals  OT Goals(current goals can now be found in the care plan section)  Progress towards OT goals: Progressing toward goals  Acute Rehab OT Goals Patient Stated Goal: go to rehab OT Goal Formulation: With patient Time For Goal Achievement: 01/23/23 Potential to Achieve Goals: Good ADL Goals Pt Will Perform Grooming: with modified independence;standing Pt Will Perform  Lower Body Dressing: with modified independence;sit to/from stand Pt Will Transfer to Toilet: with modified independence;ambulating;regular height toilet  Plan      Co-evaluation                 AM-PAC OT "6 Clicks" Daily Activity     Outcome Measure   Help from another person eating meals?: None Help from another person taking care of personal grooming?: A Little Help from another person toileting, which includes using toliet, bedpan, or urinal?: A Little Help from another person bathing (including washing, rinsing, drying)?: A Little Help from another person to put on and taking off regular upper body clothing?: A Little Help from another person to put on and taking off regular lower body clothing?: A Little 6 Click Score: 19    End of Session Equipment  Utilized During Treatment: Rolling walker (2 wheels)  OT Visit Diagnosis: Other abnormalities of gait and mobility (R26.89);Muscle weakness (generalized) (M62.81)   Activity Tolerance Patient tolerated treatment well   Patient Left in chair;with call bell/phone within reach   Nurse Communication          Time: 1610-9604 OT Time Calculation (min): 18 min  Charges: OT General Charges $OT Visit: 1 Visit OT Treatments $Self Care/Home Management : 8-22 mins  Kathie Dike, M.S. OTR/L  01/14/23, 10:24 AM  ascom [phone]

## 2023-01-14 NOTE — TOC Transition Note (Signed)
Transition of Care [facility]) - CM/SW Discharge Note   Patient Details  Name: Corey Foster MRN: [id number] Date of Birth: 06/10/1955  [facility] CM/SW Contact:  Marlowe Sax, RN Phone Number: 01/14/2023, 3:19 PM   Clinical Narrative:    Going [facility] room 320, EMS Called to arrange transport He is 4th on the transport list   Final next level of care: Skilled Nursing Facility Barriers to Discharge: SNF Pending bed offer, Insurance Authorization   Patient Goals and CMS Choice      Discharge Placement                         Discharge Plan and Services Additional resources added to the After Visit Summary for     Discharge Planning Services: CM Consult              DME Agency: NA       HH Arranged: NA          Social Determinants of Health (SDOH) Interventions SDOH Screenings   Food Insecurity: No Food Insecurity (01/07/2023)  Housing: Low Risk  (01/07/2023)  Transportation Needs: No Transportation Needs (01/07/2023)  Utilities: Not At Risk (01/07/2023)  Tobacco Use: High Risk (01/09/2023)     Readmission Risk Interventions     No data to display

## 2023-01-14 NOTE — Discharge Summary (Signed)
[facility] Discharge Summary   Patient: Corey Foster ZOX:[id number]  PCP: Emogene Morgan, MD  Date of admission: 01/07/2023   Date of discharge:  01/14/2023     Discharge Diagnoses:  Principal Problem:   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) Active Problems:   Dry gangrene (HCC)   Critical limb ischemia of left lower extremity (HCC)   Admitted From: Home Disposition:  SNF   Recommendations for Outpatient Follow-up:  Follow-up with PCP, patient to be seen by an MD in 1 to 2 days, monitor vitals.  Repeat CBC and BMP in 1 to 2 weeks Follow-up with vascular surgery in 1 week for postop check Follow up LABS/TEST:     Diet recommendation: Carb modified diet  Activity: The patient is advised to gradually reintroduce usual activities, as tolerated  Discharge Condition: stable  Code Status: Full code   History of present illness: As per the H and P dictated on admission Hospital Course:  67 y.o. 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  Postop PT/OT recommended SNF   Assessment and Plan:  # Chronic left diabetic foot infection with dry gangrene like changes Status post left-sided BKA Seen by vascular.  Nonhealing ulcer.  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  S/p antibiotic. Continue dressing changes per Vasc. Started Lyrica 75 mg every evening.  Continue as needed medication for pain control  # IDDM with hyperglycemia:  Resumed home insulin regimen, monitor CBG, continue diabetic diet. # Mild Renal Insuff, Cr Fluctuating between 0.9-1.3. Encouraging oral hydration.  11/19 discontinued Lasix,  patient is euvolemic.  Changed Lasix to as needed # PVD with recent left tibial artery angioplasty, but now s/p left BKA Continue aspirin and statin   Body mass index is 34.71 kg/m.  Nutrition Interventions:  Pain control  - [place] Controlled Substance Reporting System database could not be reviewed as website was not working.   - 10 tablets prescribed.   - Patient was instructed, not to drive, operate heavy machinery, perform activities at heights, swimming or participation in water activities or provide baby sitting services while on Pain, Sleep and Anxiety Medications; until his outpatient Physician has advised to do so again.  - Also recommended to not to take more than prescribed Pain, Sleep and Anxiety Medications.  Patient was seen by physical therapy, who recommended Therapy, SNF placement, which was arranged. On the day of the discharge the patient's vitals were stable, and no other acute medical condition were reported by patient. the patient was felt safe to be discharge at [facility].  Consultants: Podiatry and vascular surgery Procedures: s/p left BKA  Discharge Exam: General: Appear in no distress, no Rash; Oral Mucosa Clear, moist. Cardiovascular: S1 and S2 Present, no Murmur, Respiratory: normal respiratory effort, Bilateral Air entry present and no Crackles, no wheezes Abdomen: Bowel Sound present, Soft and no tenderness, no hernia Extremities: no Pedal edema, no calf tenderness. S/p left BKA, dressing CDI Neurology: Grossly intact, no focal deficit. affect appropriate.  Filed Weights   01/07/23 1512 01/09/23 0707  Weight: 112.9 kg 112.9 kg   Vitals:   01/13/23 2338 01/14/23 0743  BP: 133/78 105/64  Pulse: 91 77  Resp: 20 16  Temp: 98.2 F (36.8 C) (!) 97.5 F (36.4 C)  SpO2: 100% 98%    DISCHARGE MEDICATION: Allergies as of 01/14/2023       Reactions   Nsaids Other (See Comments)   AKI AKI        Medication List     STOP taking these  medications    albuterol 108 (90 Base) MCG/ACT inhaler Commonly known as: VENTOLIN HFA   amoxicillin-clavulanate 875-125 MG tablet Commonly known as: AUGMENTIN   Trulicity 1.5 MG/0.5ML Soaj Generic drug: Dulaglutide       TAKE these medications    ALPRAZolam 1 MG tablet Commonly known as: XANAX Take 1 tablet (1 mg total) by mouth 3 (three) times daily as needed for anxiety. What changed:  when to take this reasons to take this   AMBULATORY NON FORMULARY MEDICATION Trimix (30/1/10)-(Pap/Phent/PGE)  Dosage: Inject 0.3 cc and may increase 0.1cc to achieve and erection lasting no longer than 1 hour per injection   Vial 1ml  Qty #5 refills 2  [facility] [phone] Fax [phone]   aspirin EC 325 MG tablet Take 325 mg by mouth daily.   baclofen 10 MG tablet Commonly known as: LIORESAL Take 10 mg by mouth 2 (two) times daily.   bisacodyl 5 MG EC tablet Commonly known as: DULCOLAX Take 2 tablets (10 mg total) by mouth at bedtime.   bisacodyl 10 MG suppository Commonly known as: DULCOLAX Place 1 suppository (10 mg total) rectally daily as needed for severe constipation.   cholecalciferol 10 MCG (400 UNIT) Tabs tablet Commonly known as: VITAMIN D3 Take 10,000 Units by mouth daily.   Fish Oil 1000 MG Caps Take 1 capsule by mouth daily.   furosemide 20 MG tablet Commonly known as: LASIX Take 1 tablet (20 mg total) by mouth daily as needed for fluid or edema. What changed:  when to take this reasons to take this   Jardiance 25 MG Tabs tablet Generic drug: empagliflozin Take 25 mg by mouth daily.   KETO PO Take 1 capsule by mouth.   naloxone 0.4 MG/ML injection Commonly known as: NARCAN Inject as directed as directed   NovoLOG FlexPen 100 UNIT/ML FlexPen Generic drug: insulin aspart Inject 40 Units into the skin 3 (three) times daily with meals.   oxyCODONE 5 MG immediate release tablet Commonly known as: Roxicodone Take 1 tablet (5  mg total) by mouth every 6 (six) hours as needed for severe pain (pain score 7-10). What changed:  medication strength how much to take when to take this reasons to take this   polyethylene glycol 17 g packet Commonly known as: MIRALAX / GLYCOLAX Take 17 g by mouth 2 (two) times daily.   pregabalin 75 MG capsule Commonly known as: LYRICA Take 1 capsule (75 mg total) by mouth every evening.   sildenafil 20 MG tablet Commonly known as: REVATIO TAKE 2 TO 5 TABLETS BY MOUTH 1 HOUR PRIOR TO INTERCOURSE   simvastatin 40 MG tablet Commonly known as: ZOCOR Take 1 tablet (40 mg total) by mouth daily.   Testosterone 20.25 MG/ACT (1.62%) Gel APPLY 4 APPLICATIONS TOPICALLY DAILY.   Toujeo Max SoloStar 300 UNIT/ML Solostar Pen Generic drug: insulin glargine (2 Unit Dial) Inject 80-120 Units into the skin in the morning and at bedtime. Inject 80 Units into the skin daily. 80 units in the am and 120 units at bedtime   vitamin C 250 MG tablet Commonly known as: ASCORBIC ACID Take 250 mg  by mouth daily.               Durable Medical Equipment  (From admission, onward)           Start     Ordered   01/10/23 1208  For home use only DME wheelchair cushion (seat and back)  Once        01/10/23 1207   01/10/23 1208  For home use only DME Bedside commode  Once       Question:  Patient needs a bedside commode to treat with the following condition  Answer:  Ambulatory dysfunction   01/10/23 1207           Allergies  Allergen Reactions   Nsaids Other (See Comments)    AKI AKI    Discharge Instructions     Call MD for:  difficulty breathing, headache or visual disturbances   Complete by: As directed    Call MD for:  extreme fatigue   Complete by: As directed    Call MD for:  persistant dizziness or light-headedness   Complete by: As directed    Call MD for:  persistant nausea and vomiting   Complete by: As directed    Call MD for:  severe uncontrolled pain    Complete by: As directed    Call MD for:  temperature >100.4   Complete by: As directed    Diet - low sodium heart healthy   Complete by: As directed    Discharge instructions   Complete by: As directed    Follow-up with PCP, patient to be seen by an MD in 1 to 2 days, monitor vitals.  Repeat CBC and BMP in 1 to 2 weeks Follow-up with vascular surgery in 1 week for postop check   Increase activity slowly   Complete by: As directed        The results of significant diagnostics from this hospitalization (including imaging, microbiology, ancillary and laboratory) are listed below for reference.    Significant Diagnostic Studies: [place] OR NERVE BLOCK-IMAGE ONLY [facility])  Result Date: 01/09/2023 There is no interpretation for this exam.  This order is for images obtained during a surgical procedure.  Please See "Surgeries" Tab for more information regarding the procedure.   DG Chest 2 View  Result Date: 01/07/2023 CLINICAL DATA:  [id number] Diabetic retinopathy (HCC) 295284. Chest pain. Shortness of breath. EXAM: CHEST - 2 VIEW COMPARISON:  12/17/2022. FINDINGS: Bilateral lung fields are clear. Bilateral costophrenic angles are clear. Normal cardio-mediastinal silhouette. No acute osseous abnormalities. The soft tissues are within normal limits. IMPRESSION: *No active cardiopulmonary disease. Electronically Signed   By: Jules Schick M.D.   On: 01/07/2023 17:33   DG Foot Complete Left  Result Date: 12/19/2022 CLINICAL DATA:  Postoperative, amputation, wound check EXAM: LEFT FOOT - COMPLETE 3+ VIEW COMPARISON:  12/17/2022 FINDINGS: Interval amputation of the first digit to the proximal metaphysis of the first metatarsal. Overall expected postoperative appearance with overlying bandaging, and a small amount of residual postoperative gas along the soft tissues. No bony destructive findings characteristic of osteomyelitis noted. Mild dorsal soft tissue swelling along the forefoot. Atheromatous  vascular calcifications. IMPRESSION: 1. Interval amputation of the first digit to the proximal metaphysis of the first metatarsal. Expected postoperative appearance. 2. Mild dorsal soft tissue swelling along the forefoot. 3. Atheromatous vascular calcifications. Electronically Signed   By: Gaylyn Rong M.D.   On: 12/19/2022 17:44   DG MINI C-ARM IMAGE ONLY  Result Date: 12/19/2022  There is no interpretation for this exam.  This order is for images obtained during a surgical procedure.  Please See "Surgeries" Tab for more information regarding the procedure.   PERIPHERAL VASCULAR CATHETERIZATION  Result Date: 12/18/2022 See surgical note for result.  DG Foot Complete Left  Result Date: 12/17/2022 CLINICAL DATA:  Oozing first toe wound, initial encounter EXAM: LEFT FOOT - COMPLETE 3+ VIEW COMPARISON:  12/08/2022 FINDINGS: Changes consistent with prior first toe amputation are seen. Soft tissue irregularity is noted consistent with the soft tissue wound. No definitive erosive changes are noted at this time. IMPRESSION: Soft tissue wound without acute bony abnormality. Electronically Signed   By: Alcide Clever M.D.   On: 12/17/2022 19:50   DG Chest Port 1 View  Result Date: 12/17/2022 CLINICAL DATA:  Questionable sepsis EXAM: PORTABLE CHEST 1 VIEW COMPARISON:  Chest x-ray 01/28/2021 FINDINGS: The heart size and mediastinal contours are within normal limits. Both lungs are clear. The visualized skeletal structures are unremarkable. IMPRESSION: No active disease. Electronically Signed   By: Darliss Cheney M.D.   On: 12/17/2022 19:41    Microbiology: No results found for this or any previous visit (from the past 240 hour(s)).   Labs: CBC: Recent Labs  Lab 01/07/23 1638 01/09/23 0555 01/11/23 0610 01/12/23 0416 01/13/23 0545 01/13/23 1632 01/14/23 0520  WBC 7.8   < > 10.5 8.1 6.9 6.9 6.2  NEUTROABS 5.5  --   --   --   --   --   --   HGB 15.1   < > 11.7* 11.9* 12.0* 11.8* 11.8*  HCT  44.6   < > 35.0* 35.6* 35.3* 35.2* 35.8*  MCV 94.1   < > 94.3 94.9 94.9 96.2 97.0  PLT 263   < > 192 200 216 223 220   < > = values in this interval not displayed.   Basic Metabolic Panel: Recent Labs  Lab 01/09/23 0555 01/10/23 0416 01/11/23 0610 01/12/23 0416 01/13/23 0545 01/14/23 0520  NA 138 138 134* 137 139 140  K 4.3 4.7 4.3 4.5 4.4 4.5  CL 106 105 100 101 103 104  CO2 23 25 26 27 30 29   GLUCOSE 51* 136* 155* 162* 156* 162*  BUN 13 13 19 19 18 22   CREATININE 0.90 0.95 1.31* 1.21 1.35* 1.26*  CALCIUM 8.5* 8.3* 8.1* 8.4* 8.2* 8.5*  MG 2.4 2.3 2.4 2.6* 2.8* 2.7*  PHOS 3.3 2.7  --   --   --   --    Liver Function Tests: Recent Labs  Lab 01/07/23 1638  AST 21  ALT 21  ALKPHOS 63  BILITOT 0.3  PROT 7.2  ALBUMIN 3.2*   No results for input(s): "LIPASE", "AMYLASE" in the last 168 hours. No results for input(s): "AMMONIA" in the last 168 hours. Cardiac Enzymes: No results for input(s): "CKTOTAL", "CKMB", "CKMBINDEX", "TROPONINI" in the last 168 hours. BNP (last 3 results) No results for input(s): "BNP" in the last 8760 hours. CBG: Recent Labs  Lab 01/13/23 1741 01/13/23 2159 01/14/23 0744 01/14/23 0851 01/14/23 1126  GLUCAP 241* 159* 192* 177* 140*    Time spent: 35 minutes  Signed:  Gillis Santa  [facility] 01/14/2023 2:22 PM

## 2023-01-16 LAB — SURGICAL PATHOLOGY

## 2023-02-09 ENCOUNTER — Ambulatory Visit (INDEPENDENT_AMBULATORY_CARE_PROVIDER_SITE_OTHER): Payer: Medicare HMO | Admitting: Nurse Practitioner

## 2023-02-09 ENCOUNTER — Ambulatory Visit: Payer: Medicare HMO | Admitting: Physician Assistant

## 2023-02-26 ENCOUNTER — Ambulatory Visit: Payer: Medicare HMO | Admitting: Physician Assistant

## 2023-03-04 ENCOUNTER — Ambulatory Visit (INDEPENDENT_AMBULATORY_CARE_PROVIDER_SITE_OTHER): Payer: Medicare HMO | Admitting: Nurse Practitioner

## 2023-03-04 ENCOUNTER — Encounter (INDEPENDENT_AMBULATORY_CARE_PROVIDER_SITE_OTHER): Payer: Self-pay | Admitting: Nurse Practitioner

## 2023-03-04 VITALS — BP 122/89 | HR 91 | Resp 16

## 2023-03-04 DIAGNOSIS — Z89512 Acquired absence of left leg below knee: Secondary | ICD-10-CM

## 2023-03-04 NOTE — Progress Notes (Signed)
 Subjective:    Patient ID: Corey Foster, male    DOB: 1955-07-30, 68 y.o.   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  He is doing fairly well but does complain of having some phantom limb issues.  He notes that he has fallen a couple times but the incision site remains intact.    Review of Systems  Skin:  Positive for wound.  Neurological:  Positive for weakness.  All other systems reviewed and are negative.      Objective:   Physical Exam Vitals reviewed.  HENT:     Head: Normocephalic.  Cardiovascular:     Rate and Rhythm: Normal rate.  Pulmonary:     Effort: Pulmonary effort is normal.  Musculoskeletal:     Left Lower Extremity: Left leg is amputated below knee.  Skin:    General: Skin is warm and dry.  Neurological:     Mental Status: He is alert and oriented to person, place, and time.  Psychiatric:        Mood and Affect: Mood normal.        Behavior: Behavior normal.        Thought Content: Thought content normal.        Judgment: Judgment normal.     BP 122/89   Pulse 91   Resp 16   Past Medical History:  Diagnosis Date   Arthritis    Diabetes mellitus without complication (HCC)    DJD (degenerative joint disease)    GERD (gastroesophageal reflux disease)    Hyperlipidemia    Hypertension    Sciatic nerve pain    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   Sleep apnea     Social History   Socioeconomic History   Marital status: Single    Spouse name: Not on file   Number of children: Not on file   Years of education: Not on file   Highest education level: Not on file  Occupational History   Not on file  Tobacco Use   Smoking status: Every Day    Current packs/day: 0.25    Average packs/day: 0.3 packs/day for 40.0 years (10.0 ttl pk-yrs)    Types: Cigarettes   Smokeless tobacco: Never  Vaping  Use   Vaping status: Never Used  Substance and Sexual Activity   Alcohol use: Yes    Alcohol/week: 18.0 standard drinks of alcohol    Types: 12 Cans of beer, 6 Shots of liquor per week   Drug use: Not Currently    Types: Cocaine, Marijuana   Sexual activity: Yes  Other Topics Concern   Not on file  Social History Narrative   Not on file   Social Drivers of Health   Financial Resource Strain: Not on file  Food Insecurity: No Food Insecurity (01/07/2023)   Hunger Vital Sign    Worried About Running Out of Food in the Last Year: Never true    Ran Out of Food in the Last Year: Never true  Transportation Needs: No Transportation Needs (01/07/2023)   PRAPARE - Administrator, Civil Service (Medical): No    Lack of Transportation (Non-Medical): No  Physical Activity: Not on file  Stress: Not on file  Social Connections: Not on file  Intimate Partner Violence: Not At Risk (01/07/2023)   Humiliation, Afraid, Rape,  and Kick questionnaire    Fear of Current or Ex-Partner: No    Emotionally Abused: No    Physically Abused: No    Sexually Abused: No    Past Surgical History:  Procedure Laterality Date   AMPUTATION Left 11/29/2022   Procedure: AMPUTATION RAY, PARTIAL 1ST RAY;  Surgeon: Lennie Barter, DPM;  Location: [facility] OR[facility];  Service: Orthopedics/Podiatry;  Laterality: Left;   AMPUTATION Left 12/19/2022   Procedure: FIRST LEFT RAY AMPUTATION;  Surgeon: Lennie Barter, DPM;  Location: [facility];  Service: Orthopedics/Podiatry;  Laterality: Left;   AMPUTATION Left 01/09/2023   Procedure: AMPUTATION BELOW KNEE;  Surgeon: Marea Selinda RAMAN, MD;  Location: [facility];  Service: General;  Laterality: Left;   CYSTOSCOPY W/ RETROGRADES Bilateral 05/31/2019   Procedure: CYSTOSCOPY WITH RETROGRADE PYELOGRAM;  Surgeon: Twylla Glendia BROCKS, MD;  Location: [facility];  Service: Urology;  Laterality: Bilateral;   LOWER EXTREMITY ANGIOGRAPHY Left 11/28/2022   Procedure: Lower Extremity Angiography;   Surgeon: Marea Selinda RAMAN, MD;  Location: [facility];  Service: Cardiovascular;  Laterality: Left;   LOWER EXTREMITY ANGIOGRAPHY Left 12/18/2022   Procedure: Lower Extremity Angiography;  Surgeon: Marea Selinda RAMAN, MD;  Location: [facility];  Service: Cardiovascular;  Laterality: Left;   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 -C N/A 05/31/2019   Procedure: TRANSURETHRAL RESECTION OF BLADDER TUMOR WITH gemcitabine ;  Surgeon: Twylla Glendia BROCKS, MD;  Location: [facility];  Service: Urology;  Laterality: N/A;    Family History  Problem Relation Age of Onset   Diabetes Mother    Heart disease Mother    Diabetes Father    Heart disease Father    Diabetes Brother     Allergies  Allergen Reactions   Nsaids Other (See Comments)    AKI AKI        Latest Ref Rng & Units 01/14/2023    5:20 AM 01/13/2023    4:32 PM 01/13/2023    5:45 AM  CBC  WBC 4.0 - 10.5 K/uL 6.2  6.9  6.9   Hemoglobin 13.0 - 17.0 g/dL 88.1  88.1  87.9   Hematocrit 39.0 - 52.0 % 35.8  35.2  35.3   Platelets 150 - 400 K/uL 220  223  216       CMP     Component Value Date/Time   NA 140 01/14/2023 0520   K 4.5 01/14/2023 0520   CL 104 01/14/2023 0520   CO2 29 01/14/2023 0520   GLUCOSE 162 (H) 01/14/2023 0520   BUN 22 01/14/2023 0520   CREATININE 1.26 (H) 01/14/2023 0520   CALCIUM  8.5 (L) 01/14/2023 0520   PROT 7.2 01/07/2023 1638   ALBUMIN 3.2 (L) 01/07/2023 1638   AST 21 01/07/2023 1638   ALT 21 01/07/2023 1638   ALKPHOS 63 01/07/2023 1638   BILITOT 0.3 01/07/2023 1638   GFRNONAA >60 01/14/2023 0520     No results found.     Assessment & Plan:   1. Hx of left BKA (HCC) (Primary) Despite falls the patient's amputation site is intact.  Actively staples removed today.  He will return in 1 to 2 weeks for further staple removal.  He is advised not to fall on his amputation site as this can cause dehiscence.  Due to his financial issues I have not instructed him to begin taking  his Lyrica  twice daily once in the morning once in the evening to see how this helps his symptoms.   Current Outpatient Medications on  File Prior to Visit  Medication Sig Dispense Refill   ALPRAZolam  (XANAX ) 1 MG tablet Take 1 tablet (1 mg total) by mouth 3 (three) times daily as needed for anxiety. 10 tablet 0   AMBULATORY NON FORMULARY MEDICATION Trimix (30/1/10)-(Pap/Phent/PGE)  Dosage: Inject 0.3 cc and may increase 0.1cc to achieve and erection lasting no longer than 1 hour per injection   Vial 1ml  Qty #5 refills 2  [facility] [phone] Fax [phone] 5 mL 2   aspirin  EC 325 MG tablet Take 325 mg by mouth daily.     baclofen  (LIORESAL ) 10 MG tablet Take 10 mg by mouth 2 (two) times daily.      bisacodyl  (DULCOLAX) 10 MG suppository Place 1 suppository (10 mg total) rectally daily as needed for severe constipation.     bisacodyl  (DULCOLAX) 5 MG EC tablet Take 2 tablets (10 mg total) by mouth at bedtime.     cholecalciferol  (VITAMIN D ) 400 UNITS TABS tablet Take 10,000 Units by mouth daily.      Continuous Glucose Sensor (FREESTYLE LIBRE 2 SENSOR) MISC      furosemide  (LASIX ) 20 MG tablet Take 1 tablet (20 mg total) by mouth daily as needed for fluid or edema.     JARDIANCE  25 MG TABS tablet Take 25 mg by mouth daily.     naloxone (NARCAN) 0.4 MG/ML injection Inject as directed as directed     NOVOLOG  FLEXPEN 100 UNIT/ML FlexPen Inject 40 Units into the skin 3 (three) times daily with meals.     Nutritional Supplements (KETO PO) Take 1 capsule by mouth.     Omega-3 Fatty Acids (FISH OIL) 1000 MG CAPS Take 1 capsule by mouth daily.      oxyCODONE  (ROXICODONE ) 5 MG immediate release tablet Take 1 tablet (5 mg total) by mouth every 6 (six) hours as needed for severe pain (pain score 7-10). 10 tablet 0   polyethylene glycol (MIRALAX  / GLYCOLAX ) 17 g packet Take 17 g by mouth 2 (two) times daily.     pregabalin  (LYRICA ) 75 MG capsule Take 1 capsule (75 mg total) by mouth  every evening. 30 capsule 5   sildenafil  (REVATIO ) 20 MG tablet TAKE 2 TO 5 TABLETS BY MOUTH 1 HOUR PRIOR TO INTERCOURSE 30 tablet 0   simvastatin  (ZOCOR ) 40 MG tablet Take 1 tablet (40 mg total) by mouth daily. 30 tablet 0   Testosterone  20.25 MG/ACT (1.62%) GEL APPLY 4 APPLICATIONS TOPICALLY DAILY. 150 g 2   TOUJEO  MAX SOLOSTAR 300 UNIT/ML Solostar Pen Inject 80-120 Units into the skin in the morning and at bedtime. Inject 80 Units into the skin daily. 80 units in the am and 120 units at bedtime     vitamin C  (ASCORBIC ACID ) 250 MG tablet Take 250 mg by mouth daily.     No current facility-administered medications on file prior to visit.    There are no Patient Instructions on file for this visit. No follow-ups on file.   Allee Busk E Alethia Melendrez, NP

## 2023-03-18 ENCOUNTER — Ambulatory Visit (INDEPENDENT_AMBULATORY_CARE_PROVIDER_SITE_OTHER): Payer: Medicare HMO

## 2023-03-23 ENCOUNTER — Ambulatory Visit (INDEPENDENT_AMBULATORY_CARE_PROVIDER_SITE_OTHER): Payer: Medicare HMO | Admitting: Nurse Practitioner

## 2023-03-23 ENCOUNTER — Encounter (INDEPENDENT_AMBULATORY_CARE_PROVIDER_SITE_OTHER): Payer: Self-pay

## 2023-03-23 ENCOUNTER — Other Ambulatory Visit (INDEPENDENT_AMBULATORY_CARE_PROVIDER_SITE_OTHER): Payer: Self-pay | Admitting: Nurse Practitioner

## 2023-03-23 VITALS — BP 88/55 | HR 81 | Resp 18 | Ht 71.0 in | Wt 248.0 lb

## 2023-03-23 DIAGNOSIS — Z89512 Acquired absence of left leg below knee: Secondary | ICD-10-CM

## 2023-03-23 MED ORDER — PREGABALIN 75 MG PO CAPS: 75.0000 mg | ORAL_CAPSULE | Freq: Every evening | ORAL | 5 refills | Status: AC

## 2023-03-23 MED ORDER — SULFAMETHOXAZOLE-TRIMETHOPRIM 800-160 MG PO TABS: 1.0000 | ORAL_TABLET | Freq: Two times a day (BID) | ORAL | 0 refills | Status: DC

## 2023-03-23 NOTE — Progress Notes (Signed)
Subjective:    Patient ID: Corey Foster, male    DOB: 08/22/1955, 68 y.o.   MRN: [id number] Chief Complaint  Patient presents with   Follow-up    F/u 1-2 weeks staples removal  FB    Corey Foster is a 68 year old male who had a left below-knee amputation done on 01/09/2023.  He is doing fairly well but does complain of having some phantom limb issues.  He was given Lyrica at his discharge but it appears that he is never picked it up.  In addition today when removing staples there has been some purulent drainage noted.    Review of Systems  Skin:  Positive for wound.  Neurological:  Positive for weakness.  All other systems reviewed and are negative.      Objective:   Physical Exam Vitals reviewed.  HENT:     Head: Normocephalic.  Cardiovascular:     Rate and Rhythm: Normal rate.  Pulmonary:     Effort: Pulmonary effort is normal.  Musculoskeletal:     Left Lower Extremity: Left leg is amputated below knee.  Skin:    General: Skin is warm and dry.  Neurological:     Mental Status: He is alert and oriented to person, place, and time.  Psychiatric:        Mood and Affect: Mood normal.        Behavior: Behavior normal.        Thought Content: Thought content normal.        Judgment: Judgment normal.     BP (!) 88/55   Pulse 81   Resp 18   Ht 5\' 11"  (1.803 m)   Wt 248 lb (112.5 kg)   BMI 34.59 kg/m   Past Medical History:  Diagnosis Date   Arthritis    Diabetes mellitus without complication (HCC)    DJD (degenerative joint disease)    GERD (gastroesophageal reflux disease)    Hyperlipidemia    Hypertension    Sciatic nerve pain    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   Sleep apnea     Social History   Socioeconomic History   Marital status: Single    Spouse name: Not on file   Number of children: Not on file   Years of education: Not on file   Highest education level: Not on file  Occupational History   Not on file  Tobacco Use   Smoking status:  Every Day    Current packs/day: 0.25    Average packs/day: 0.3 packs/day for 40.0 years (10.0 ttl pk-yrs)    Types: Cigarettes   Smokeless tobacco: Never  Vaping Use   Vaping status: Never Used  Substance and Sexual Activity   Alcohol use: Yes    Alcohol/week: 18.0 standard drinks of alcohol    Types: 12 Cans of beer, 6 Shots of liquor per week   Drug use: Not Currently    Types: Cocaine, Marijuana   Sexual activity: Yes  Other Topics Concern   Not on file  Social History Narrative   Not on file   Social Drivers of Health   Financial Resource Strain: Not on file  Food Insecurity: No Food Insecurity (01/07/2023)   Hunger Vital Sign    Worried About Running Out of Food in the Last Year: Never true    Ran Out of Food in the Last Year: Never true  Transportation Needs: No Transportation Needs (01/07/2023)   PRAPARE - Administrator, Civil Service (Medical): No  Lack of Transportation (Non-Medical): No  Physical Activity: Not on file  Stress: Not on file  Social Connections: Not on file  Intimate Partner Violence: Not At Risk (01/07/2023)   Humiliation, Afraid, Rape, and Kick questionnaire    Fear of Current or Ex-Partner: No    Emotionally Abused: No    Physically Abused: No    Sexually Abused: No    Past Surgical History:  Procedure Laterality Date   AMPUTATION Left 11/29/2022   Procedure: AMPUTATION RAY, PARTIAL 1ST RAY;  Surgeon: Rosetta Posner, DPM;  Location: [facility];  Service: Orthopedics/Podiatry;  Laterality: Left;   AMPUTATION Left 12/19/2022   Procedure: FIRST LEFT RAY AMPUTATION;  Surgeon: Rosetta Posner, DPM;  Location: [facility];  Service: Orthopedics/Podiatry;  Laterality: Left;   AMPUTATION Left 01/09/2023   Procedure: AMPUTATION BELOW KNEE;  Surgeon: Annice Needy, MD;  Location: [facility];  Service: General;  Laterality: Left;   CYSTOSCOPY W/ RETROGRADES Bilateral 05/31/2019   Procedure: CYSTOSCOPY WITH RETROGRADE PYELOGRAM;  Surgeon: Riki Altes, MD;  Location: [facility];  Service: Urology;  Laterality: Bilateral;   LOWER EXTREMITY ANGIOGRAPHY Left 11/28/2022   Procedure: Lower Extremity Angiography;  Surgeon: Annice Needy, MD;  Location: [facility];  Service: Cardiovascular;  Laterality: Left;   LOWER EXTREMITY ANGIOGRAPHY Left 12/18/2022   Procedure: Lower Extremity Angiography;  Surgeon: Annice Needy, MD;  Location: [facility];  Service: Cardiovascular;  Laterality: Left;   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-C N/A 05/31/2019   Procedure: TRANSURETHRAL RESECTION OF BLADDER TUMOR WITH gemcitabine;  Surgeon: Riki Altes, MD;  Location: [facility];  Service: Urology;  Laterality: N/A;    Family History  Problem Relation Age of Onset   Diabetes Mother    Heart disease Mother    Diabetes Father    Heart disease Father    Diabetes Brother     Allergies  Allergen Reactions   Nsaids Other (See Comments)    AKI AKI        Latest Ref Rng & Units 01/14/2023    5:20 AM 01/13/2023    4:32 PM 01/13/2023    5:45 AM  CBC  WBC 4.0 - 10.5 K/uL 6.2  6.9  6.9   Hemoglobin 13.0 - 17.0 g/dL 16.1  09.6  04.5   Hematocrit 39.0 - 52.0 % 35.8  35.2  35.3   Platelets 150 - 400 K/uL 220  223  216       CMP     Component Value Date/Time   NA 140 01/14/2023 0520   K 4.5 01/14/2023 0520   CL 104 01/14/2023 0520   CO2 29 01/14/2023 0520   GLUCOSE 162 (H) 01/14/2023 0520   BUN 22 01/14/2023 0520   CREATININE 1.26 (H) 01/14/2023 0520   CALCIUM 8.5 (L) 01/14/2023 0520   PROT 7.2 01/07/2023 1638   ALBUMIN 3.2 (L) 01/07/2023 1638   AST 21 01/07/2023 1638   ALT 21 01/07/2023 1638   ALKPHOS 63 01/07/2023 1638   BILITOT 0.3 01/07/2023 1638   GFRNONAA >60 01/14/2023 0520     No results found.     Assessment & Plan:   1. Hx of left BKA (HCC) (Primary) Despite falls the patient's amputation site is intact.  Remaining staples removed today.  The area was cultured due to the purulent  drainage.  We have also sent in Bactrim for the patient.  I have discussed that if the wound culture reveals a different bacteria  we may need to change his antibiotics.  We have also sent in Lyrica for the patient as I suspect the pain he is describing is phantom limb pain.  He will return in 2 weeks   Current Outpatient Medications on File Prior to Visit  Medication Sig Dispense Refill   ALPRAZolam (XANAX) 1 MG tablet Take 1 tablet (1 mg total) by mouth 3 (three) times daily as needed for anxiety. 10 tablet 0   AMBULATORY NON FORMULARY MEDICATION Trimix (30/1/10)-(Pap/Phent/PGE)  Dosage: Inject 0.3 cc and may increase 0.1cc to achieve and erection lasting no longer than 1 hour per injection   Vial 1ml  Qty #5 refills 2  [facility] [phone] Fax [phone] 5 mL 2   aspirin EC 325 MG tablet Take 325 mg by mouth daily.     baclofen (LIORESAL) 10 MG tablet Take 10 mg by mouth 2 (two) times daily.      bisacodyl (DULCOLAX) 10 MG suppository Place 1 suppository (10 mg total) rectally daily as needed for severe constipation.     bisacodyl (DULCOLAX) 5 MG EC tablet Take 2 tablets (10 mg total) by mouth at bedtime.     cholecalciferol (VITAMIN D) 400 UNITS TABS tablet Take 10,000 Units by mouth daily.      Continuous Glucose Sensor (FREESTYLE LIBRE 2 SENSOR) MISC      furosemide (LASIX) 20 MG tablet Take 1 tablet (20 mg total) by mouth daily as needed for fluid or edema.     JARDIANCE 25 MG TABS tablet Take 25 mg by mouth daily.     naloxone (NARCAN) 0.4 MG/ML injection Inject as directed as directed     NOVOLOG FLEXPEN 100 UNIT/ML FlexPen Inject 40 Units into the skin 3 (three) times daily with meals.     Nutritional Supplements (KETO PO) Take 1 capsule by mouth.     Omega-3 Fatty Acids (FISH OIL) 1000 MG CAPS Take 1 capsule by mouth daily.      oxyCODONE (ROXICODONE) 5 MG immediate release tablet Take 1 tablet (5 mg total) by mouth every 6 (six) hours as needed for severe pain  (pain score 7-10). 10 tablet 0   polyethylene glycol (MIRALAX / GLYCOLAX) 17 g packet Take 17 g by mouth 2 (two) times daily.     sildenafil (REVATIO) 20 MG tablet TAKE 2 TO 5 TABLETS BY MOUTH 1 HOUR PRIOR TO INTERCOURSE 30 tablet 0   simvastatin (ZOCOR) 40 MG tablet Take 1 tablet (40 mg total) by mouth daily. 30 tablet 0   Testosterone 20.25 MG/ACT (1.62%) GEL APPLY 4 APPLICATIONS TOPICALLY DAILY. 150 g 2   TOUJEO MAX SOLOSTAR 300 UNIT/ML Solostar Pen Inject 80-120 Units into the skin in the morning and at bedtime. Inject 80 Units into the skin daily. 80 units in the am and 120 units at bedtime     vitamin C (ASCORBIC ACID) 250 MG tablet Take 250 mg by mouth daily.     No current facility-administered medications on file prior to visit.    There are no Patient Instructions on file for this visit. No follow-ups on file.   Georgiana Spinner, NP

## 2023-03-26 LAB — AEROBIC CULTURE

## 2023-03-31 ENCOUNTER — Encounter: Payer: Self-pay | Admitting: Internal Medicine

## 2023-04-07 ENCOUNTER — Ambulatory Visit (INDEPENDENT_AMBULATORY_CARE_PROVIDER_SITE_OTHER): Payer: Medicare HMO | Admitting: Nurse Practitioner

## 2023-04-14 ENCOUNTER — Telehealth (INDEPENDENT_AMBULATORY_CARE_PROVIDER_SITE_OTHER): Payer: Self-pay

## 2023-04-14 NOTE — Telephone Encounter (Signed)
Patient left a message stating that he has staple left in the incision. Patient was advise that staple will be removed on upcoming appointment scheduled for 04/21/23. Patient verbalized understanding.

## 2023-04-21 ENCOUNTER — Encounter (INDEPENDENT_AMBULATORY_CARE_PROVIDER_SITE_OTHER): Payer: Self-pay | Admitting: Nurse Practitioner

## 2023-04-21 ENCOUNTER — Ambulatory Visit (INDEPENDENT_AMBULATORY_CARE_PROVIDER_SITE_OTHER): Payer: Medicare HMO | Admitting: Vascular Surgery

## 2023-04-21 VITALS — BP 99/62 | HR 89 | Resp 16

## 2023-04-21 DIAGNOSIS — I10 Essential (primary) hypertension: Secondary | ICD-10-CM | POA: Diagnosis not present

## 2023-04-21 DIAGNOSIS — I70222 Atherosclerosis of native arteries of extremities with rest pain, left leg: Secondary | ICD-10-CM

## 2023-04-21 DIAGNOSIS — E1152 Type 2 diabetes mellitus with diabetic peripheral angiopathy with gangrene: Secondary | ICD-10-CM

## 2023-04-21 DIAGNOSIS — E785 Hyperlipidemia, unspecified: Secondary | ICD-10-CM | POA: Diagnosis not present

## 2023-04-21 DIAGNOSIS — E11621 Type 2 diabetes mellitus with foot ulcer: Secondary | ICD-10-CM | POA: Diagnosis not present

## 2023-04-21 DIAGNOSIS — L97509 Non-pressure chronic ulcer of other part of unspecified foot with unspecified severity: Secondary | ICD-10-CM

## 2023-04-21 DIAGNOSIS — Z89512 Acquired absence of left leg below knee: Secondary | ICD-10-CM

## 2023-04-21 NOTE — Assessment & Plan Note (Signed)
 lipid control important in reducing the progression of atherosclerotic disease. Continue statin therapy

## 2023-04-21 NOTE — Assessment & Plan Note (Signed)
 blood glucose control important in reducing the progression of atherosclerotic disease. Also, involved in wound healing. On appropriate medications.

## 2023-04-21 NOTE — Assessment & Plan Note (Signed)
 blood pressure control important in reducing the progression of atherosclerotic disease. On appropriate oral medications.

## 2023-04-21 NOTE — Progress Notes (Signed)
 MRN : [id number]  Corey Foster is a 68 y.o. (1955-03-27) male who presents with chief complaint of  Chief Complaint  Patient presents with   Follow-up    2 week follow up  .  History of Present Illness: Patient returns today in follow up of his left below-knee amputation that was performed 3 to 4 months ago.  It has healed at this point despite multiple falls.  He has a scab medially with good clean granulation tissue underneath it that is less than a centimeter when the scab is removed today.  No erythema or drainage.  No current ulceration or rest pain on the right leg.  Current Outpatient Medications  Medication Sig Dispense Refill   ALPRAZolam (XANAX) 1 MG tablet Take 1 tablet (1 mg total) by mouth 3 (three) times daily as needed for anxiety. 10 tablet 0   AMBULATORY NON FORMULARY MEDICATION Trimix (30/1/10)-(Pap/Phent/PGE)  Dosage: Inject 0.3 cc and may increase 0.1cc to achieve and erection lasting no longer than 1 hour per injection   Vial 1ml  Qty #5 refills 2  [facility] [phone] Fax [phone] 5 mL 2   aspirin EC 325 MG tablet Take 325 mg by mouth daily.     baclofen (LIORESAL) 10 MG tablet Take 10 mg by mouth 2 (two) times daily.      bisacodyl (DULCOLAX) 10 MG suppository Place 1 suppository (10 mg total) rectally daily as needed for severe constipation.     bisacodyl (DULCOLAX) 5 MG EC tablet Take 2 tablets (10 mg total) by mouth at bedtime.     cholecalciferol (VITAMIN D) 400 UNITS TABS tablet Take 10,000 Units by mouth daily.      Continuous Glucose Sensor (FREESTYLE LIBRE 2 SENSOR) MISC      furosemide (LASIX) 20 MG tablet Take 1 tablet (20 mg total) by mouth daily as needed for fluid or edema.     JARDIANCE 25 MG TABS tablet Take 25 mg by mouth daily.     naloxone (NARCAN) 0.4 MG/ML injection Inject as directed as directed     NOVOLOG FLEXPEN 100 UNIT/ML FlexPen Inject 40 Units into the skin 3 (three) times daily with meals.     Nutritional  Supplements (KETO PO) Take 1 capsule by mouth.     Omega-3 Fatty Acids (FISH OIL) 1000 MG CAPS Take 1 capsule by mouth daily.      oxyCODONE (ROXICODONE) 5 MG immediate release tablet Take 1 tablet (5 mg total) by mouth every 6 (six) hours as needed for severe pain (pain score 7-10). 10 tablet 0   polyethylene glycol (MIRALAX / GLYCOLAX) 17 g packet Take 17 g by mouth 2 (two) times daily.     pregabalin (LYRICA) 75 MG capsule Take 1 capsule (75 mg total) by mouth every evening. 30 capsule 5   sildenafil (REVATIO) 20 MG tablet TAKE 2 TO 5 TABLETS BY MOUTH 1 HOUR PRIOR TO INTERCOURSE 30 tablet 0   simvastatin (ZOCOR) 40 MG tablet Take 1 tablet (40 mg total) by mouth daily. 30 tablet 0   sulfamethoxazole-trimethoprim (BACTRIM DS) 800-160 MG tablet Take 1 tablet by mouth 2 (two) times daily. 20 tablet 0   Testosterone 20.25 MG/ACT (1.62%) GEL APPLY 4 APPLICATIONS TOPICALLY DAILY. 150 g 2   TOUJEO MAX SOLOSTAR 300 UNIT/ML Solostar Pen Inject 80-120 Units into the skin in the morning and at bedtime. Inject 80 Units into the skin daily. 80 units in the am and 120 units at bedtime  vitamin C (ASCORBIC ACID) 250 MG tablet Take 250 mg by mouth daily.     No current facility-administered medications for this visit.    Past Medical History:  Diagnosis Date   Arthritis    Diabetes mellitus without complication (HCC)    DJD (degenerative joint disease)    GERD (gastroesophageal reflux disease)    Hyperlipidemia    Hypertension    Sciatic nerve pain    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   Sleep apnea     Past Surgical History:  Procedure Laterality Date   AMPUTATION Left 11/29/2022   Procedure: AMPUTATION RAY, PARTIAL 1ST RAY;  Surgeon: Rosetta Posner, DPM;  Location: [facility];  Service: Orthopedics/Podiatry;  Laterality: Left;   AMPUTATION Left 12/19/2022   Procedure: FIRST LEFT RAY AMPUTATION;  Surgeon: Rosetta Posner, DPM;  Location: [facility];  Service: Orthopedics/Podiatry;  Laterality: Left;    AMPUTATION Left 01/09/2023   Procedure: AMPUTATION BELOW KNEE;  Surgeon: Annice Needy, MD;  Location: [facility];  Service: General;  Laterality: Left;   CYSTOSCOPY W/ RETROGRADES Bilateral 05/31/2019   Procedure: CYSTOSCOPY WITH RETROGRADE PYELOGRAM;  Surgeon: Riki Altes, MD;  Location: [facility];  Service: Urology;  Laterality: Bilateral;   LOWER EXTREMITY ANGIOGRAPHY Left 11/28/2022   Procedure: Lower Extremity Angiography;  Surgeon: Annice Needy, MD;  Location: [facility];  Service: Cardiovascular;  Laterality: Left;   LOWER EXTREMITY ANGIOGRAPHY Left 12/18/2022   Procedure: Lower Extremity Angiography;  Surgeon: Annice Needy, MD;  Location: [facility];  Service: Cardiovascular;  Laterality: Left;   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-C N/A 05/31/2019   Procedure: TRANSURETHRAL RESECTION OF BLADDER TUMOR WITH gemcitabine;  Surgeon: Riki Altes, MD;  Location: [facility];  Service: Urology;  Laterality: N/A;     Social History   Tobacco Use   Smoking status: Every Day    Current packs/day: 0.25    Average packs/day: 0.3 packs/day for 40.0 years (10.0 ttl pk-yrs)    Types: Cigarettes   Smokeless tobacco: Never  Vaping Use   Vaping status: Never Used  Substance Use Topics   Alcohol use: Yes    Alcohol/week: 18.0 standard drinks of alcohol    Types: 12 Cans of beer, 6 Shots of liquor per week   Drug use: Not Currently    Types: Cocaine, Marijuana      Family History  Problem Relation Age of Onset   Diabetes Mother    Heart disease Mother    Diabetes Father    Heart disease Father    Diabetes Brother      Allergies  Allergen Reactions   Nsaids Other (See Comments)    AKI AKI      REVIEW OF SYSTEMS (Negative unless checked)  Constitutional: [] Weight loss  [] Fever  [] Chills Cardiac: [] Chest pain   [] Chest pressure   [] Palpitations   [] Shortness of breath when laying flat   [] Shortness of breath at rest   [] Shortness of  breath with exertion. Vascular:  [] Pain in legs with walking   [] Pain in legs at rest   [] Pain in legs when laying flat   [] Claudication   [] Pain in feet when walking  [] Pain in feet at rest  [] Pain in feet when laying flat   [] History of DVT   [] Phlebitis   [] Swelling in legs   [] Varicose veins   [] Non-healing ulcers Pulmonary:   [] Uses home oxygen   [] Productive cough   [] Hemoptysis   [] Wheeze  [] COPD   []   Asthma Neurologic:  [] Dizziness  [] Blackouts   [] Seizures   [] History of stroke   [] History of TIA  [] Aphasia   [] Temporary blindness   [] Dysphagia   [] Weakness or numbness in arms   [] Weakness or numbness in legs Musculoskeletal:  [] Arthritis   [] Joint swelling   [x] Joint pain   [x] Low back pain Hematologic:  [] Easy bruising  [] Easy bleeding   [] Hypercoagulable state   [] Anemic   Gastrointestinal:  [] Blood in stool   [] Vomiting blood  [x] Gastroesophageal reflux/heartburn   [] Abdominal pain Genitourinary:  [] Chronic kidney disease   [] Difficult urination  [] Frequent urination  [] Burning with urination   [] Hematuria Skin:  [] Rashes   [x] Ulcers   [x] Wounds Psychological:  [] History of anxiety   []  History of major depression.  Physical Examination  BP 99/62   Pulse 89   Resp 16  Gen:  WD/WN, NAD Head: [place]/AT, No temporalis wasting. Ear/Nose/Throat: Hearing grossly intact, nares w/o erythema or drainage Eyes: Conjunctiva clear. Sclera non-icteric Neck: Supple.  Trachea midline Pulmonary:  Good air movement, no use of accessory muscles.  Cardiac: RRR, no JVD Vascular:  Vessel Right Left  Radial Palpable Palpable                          PT Not Palpable Palpable  DP 1+ Palpable Palpable   Gastrointestinal: soft, non-tender/non-distended. No guarding/reflex.  Musculoskeletal: M/S 5/5 throughout.  No deformity or atrophy. Well healed left BKA with less than 1 cm open medially with good granulation tissue under scab.  1+ right lower extremity edema. Neurologic: Sensation grossly intact  in extremities.  Symmetrical.  Speech is fluent.  Psychiatric: Judgment intact, Mood & affect appropriate for pt's clinical situation. Dermatologic: Left BKA incision as above.      Labs Recent Results (from the past 2160 hours)  Aerobic culture     Status: Abnormal   Collection Time: 03/23/23 11:24 AM  Result Value Ref Range   Aerobic Bacterial Culture Final report (A)    Organism ID, Bacteria Comment (A)     Comment: Methicillin - resistant Staphylococcus aureus Based on resistance to oxacillin this isolate would be resistant to all currently available beta-lactam antimicrobial agents, with the exception of the newer cephalosporins with anti-MRSA activity, such as Ceftaroline Heavy growth    Antimicrobial Susceptibility Comment     Comment:       ** S = Susceptible; I = Intermediate; R = Resistant **                    P = Positive; N = Negative             MICS are expressed in micrograms per mL    Antibiotic                 RSLT#1    RSLT#2    RSLT#3    RSLT#4 Ciprofloxacin                  S Clindamycin                    R Erythromycin                   R Gentamicin                     S Levofloxacin  S Linezolid                      S Oxacillin                      R Penicillin                     R Rifampin                       S Tetracycline                   S Trimethoprim/Sulfa             S Vancomycin                     S     Radiology No results found.  Assessment/Plan  Critical limb ischemia of left lower extremity (HCC) Now status post left below-knee amputation which has healed.  Referral for prosthesis.  Recheck right lower extremity perfusion in 3 months.  Essential hypertension blood pressure control important in reducing the progression of atherosclerotic disease. On appropriate oral medications.   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 Also, involved in wound healing. On appropriate medications.   HLD (hyperlipidemia) lipid control important in reducing the progression of atherosclerotic disease. Continue statin therapy   Hx of BKA, left (HCC) 3 to 4 months post left BKA.  Wound has healed.  Referral to [facility] for prosthesis.    [name], MD  04/21/2023 4:06 PM    This note was created with Dragon medical transcription system.  Any errors from dictation are purely unintentional

## 2023-04-21 NOTE — Assessment & Plan Note (Signed)
 3 to 4 months post left BKA.  Wound has healed.  Referral to [facility] clinic for prosthesis.

## 2023-04-21 NOTE — Assessment & Plan Note (Signed)
 Now status post left below-knee amputation which has healed.  Referral for prosthesis.  Recheck right lower extremity perfusion in 3 months.

## 2023-05-04 ENCOUNTER — Encounter: Payer: Self-pay | Admitting: Internal Medicine

## 2023-05-05 ENCOUNTER — Telehealth (INDEPENDENT_AMBULATORY_CARE_PROVIDER_SITE_OTHER): Payer: Self-pay

## 2023-05-05 NOTE — Telephone Encounter (Signed)
 Harriett Sine called from [facility] needing WC orders for [employer]. She stated he has a 3x2 cm ulcer on his heel.   Please advise

## 2023-05-05 NOTE — Telephone Encounter (Signed)
 I would advise reaching out to his PCP.  We are not treating his foot wound, only his recent left BKA

## 2023-05-06 NOTE — Telephone Encounter (Signed)
 Message given- she has contacted the PCP

## 2023-05-11 ENCOUNTER — Other Ambulatory Visit: Payer: Self-pay

## 2023-05-11 ENCOUNTER — Emergency Department

## 2023-05-11 DIAGNOSIS — E11621 Type 2 diabetes mellitus with foot ulcer: Principal | ICD-10-CM | POA: Diagnosis present

## 2023-05-11 DIAGNOSIS — E1165 Type 2 diabetes mellitus with hyperglycemia: Secondary | ICD-10-CM | POA: Diagnosis present

## 2023-05-11 DIAGNOSIS — Z794 Long term (current) use of insulin: Secondary | ICD-10-CM

## 2023-05-11 DIAGNOSIS — E11628 Type 2 diabetes mellitus with other skin complications: Secondary | ICD-10-CM | POA: Diagnosis not present

## 2023-05-11 DIAGNOSIS — Z89512 Acquired absence of left leg below knee: Secondary | ICD-10-CM

## 2023-05-11 DIAGNOSIS — B9561 Methicillin susceptible Staphylococcus aureus infection as the cause of diseases classified elsewhere: Secondary | ICD-10-CM | POA: Diagnosis present

## 2023-05-11 DIAGNOSIS — R7881 Bacteremia: Secondary | ICD-10-CM | POA: Diagnosis not present

## 2023-05-11 DIAGNOSIS — M51369 Other intervertebral disc degeneration, lumbar region without mention of lumbar back pain or lower extremity pain: Secondary | ICD-10-CM | POA: Diagnosis present

## 2023-05-11 DIAGNOSIS — E785 Hyperlipidemia, unspecified: Secondary | ICD-10-CM | POA: Diagnosis present

## 2023-05-11 DIAGNOSIS — Z833 Family history of diabetes mellitus: Secondary | ICD-10-CM

## 2023-05-11 DIAGNOSIS — E1151 Type 2 diabetes mellitus with diabetic peripheral angiopathy without gangrene: Secondary | ICD-10-CM | POA: Diagnosis present

## 2023-05-11 DIAGNOSIS — Z79899 Other long term (current) drug therapy: Secondary | ICD-10-CM

## 2023-05-11 DIAGNOSIS — L03115 Cellulitis of right lower limb: Secondary | ICD-10-CM | POA: Diagnosis present

## 2023-05-11 DIAGNOSIS — E559 Vitamin D deficiency, unspecified: Secondary | ICD-10-CM | POA: Diagnosis present

## 2023-05-11 DIAGNOSIS — I10 Essential (primary) hypertension: Secondary | ICD-10-CM | POA: Diagnosis present

## 2023-05-11 DIAGNOSIS — Z8551 Personal history of malignant neoplasm of bladder: Secondary | ICD-10-CM

## 2023-05-11 DIAGNOSIS — D751 Secondary polycythemia: Secondary | ICD-10-CM | POA: Diagnosis present

## 2023-05-11 DIAGNOSIS — Q2112 Patent foramen ovale: Secondary | ICD-10-CM

## 2023-05-11 DIAGNOSIS — Z7982 Long term (current) use of aspirin: Secondary | ICD-10-CM

## 2023-05-11 DIAGNOSIS — Z8249 Family history of ischemic heart disease and other diseases of the circulatory system: Secondary | ICD-10-CM

## 2023-05-11 DIAGNOSIS — E1142 Type 2 diabetes mellitus with diabetic polyneuropathy: Secondary | ICD-10-CM | POA: Diagnosis present

## 2023-05-11 DIAGNOSIS — G8929 Other chronic pain: Secondary | ICD-10-CM | POA: Diagnosis present

## 2023-05-11 DIAGNOSIS — Z7984 Long term (current) use of oral hypoglycemic drugs: Secondary | ICD-10-CM

## 2023-05-11 DIAGNOSIS — F17210 Nicotine dependence, cigarettes, uncomplicated: Secondary | ICD-10-CM | POA: Diagnosis present

## 2023-05-11 DIAGNOSIS — Z6833 Body mass index (BMI) 33.0-33.9, adult: Secondary | ICD-10-CM

## 2023-05-11 DIAGNOSIS — L84 Corns and callosities: Secondary | ICD-10-CM | POA: Diagnosis present

## 2023-05-11 DIAGNOSIS — G4733 Obstructive sleep apnea (adult) (pediatric): Secondary | ICD-10-CM | POA: Diagnosis present

## 2023-05-11 DIAGNOSIS — L97419 Non-pressure chronic ulcer of right heel and midfoot with unspecified severity: Secondary | ICD-10-CM | POA: Diagnosis present

## 2023-05-11 DIAGNOSIS — E66811 Obesity, class 1: Secondary | ICD-10-CM | POA: Diagnosis present

## 2023-05-11 DIAGNOSIS — Z8659 Personal history of other mental and behavioral disorders: Secondary | ICD-10-CM

## 2023-05-11 LAB — CBC WITH DIFFERENTIAL/PLATELET
Abs Immature Granulocytes: 0.03 10*3/uL (ref 0.00–0.07)
Basophils Absolute: 0 10*3/uL (ref 0.0–0.1)
Basophils Relative: 0 %
Eosinophils Absolute: 0.1 10*3/uL (ref 0.0–0.5)
Eosinophils Relative: 1 %
HCT: 50.8 % (ref 39.0–52.0)
Hemoglobin: 17.1 g/dL — ABNORMAL HIGH (ref 13.0–17.0)
Immature Granulocytes: 0 %
Lymphocytes Relative: 18 %
Lymphs Abs: 1.3 10*3/uL (ref 0.7–4.0)
MCH: 31.4 pg (ref 26.0–34.0)
MCHC: 33.7 g/dL (ref 30.0–36.0)
MCV: 93.2 fL (ref 80.0–100.0)
Monocytes Absolute: 0.8 10*3/uL (ref 0.1–1.0)
Monocytes Relative: 10 %
Neutro Abs: 5.2 10*3/uL (ref 1.7–7.7)
Neutrophils Relative %: 71 %
Platelets: 188 10*3/uL (ref 150–400)
RBC: 5.45 MIL/uL (ref 4.22–5.81)
RDW: 14.1 % (ref 11.5–15.5)
WBC: 7.3 10*3/uL (ref 4.0–10.5)
nRBC: 0 % (ref 0.0–0.2)

## 2023-05-11 LAB — COMPREHENSIVE METABOLIC PANEL
ALT: 20 U/L (ref 0–44)
AST: 10 U/L — ABNORMAL LOW (ref 15–41)
Albumin: 3.1 g/dL — ABNORMAL LOW (ref 3.5–5.0)
Alkaline Phosphatase: 58 U/L (ref 38–126)
Anion gap: 8 (ref 5–15)
BUN: 29 mg/dL — ABNORMAL HIGH (ref 8–23)
CO2: 17 mmol/L — ABNORMAL LOW (ref 22–32)
Calcium: 8.5 mg/dL — ABNORMAL LOW (ref 8.9–10.3)
Chloride: 110 mmol/L (ref 98–111)
Creatinine, Ser: 1.11 mg/dL (ref 0.61–1.24)
GFR, Estimated: >60 mL/min (ref >60)
Glucose, Bld: 183 mg/dL — ABNORMAL HIGH (ref 70–99)
Potassium: 4.6 mmol/L (ref 3.5–5.1)
Sodium: 135 mmol/L (ref 135–145)
Total Bilirubin: 0.5 mg/dL (ref 0.0–1.2)
Total Protein: 7.1 g/dL (ref 6.5–8.1)

## 2023-05-11 LAB — LACTIC ACID, PLASMA: Lactic Acid, Venous: 0.8 mmol/L (ref 0.5–1.9)

## 2023-05-11 NOTE — ED Triage Notes (Signed)
 Pt to ED sent for wound to right heel. +DM. Reports on antibiotics and sent for admission. Denies fevers.

## 2023-05-11 NOTE — ED Provider Triage Note (Signed)
 Emergency Medicine Provider Triage Evaluation Note  Corey Foster , a 67 y.o. male  was evaluated in triage.  Pt complains of being sent from his doctor for evaluation of a wound on the bottom of his right foot. Patient states he has been taking antibiotics orally but that his doctor thought it needed to be further evaluated. Patient is diabetic.  Review of Systems  Positive: Wound on right heel, foot pain Negative:   Physical Exam  There were no vitals taken for this visit. Gen:   Awake, no distress   Resp:  Normal effort  MSK:   Moves extremities without difficulty  Other:    Medical Decision Making  Medically screening exam initiated at 5:09 PM.  Appropriate orders placed.  Betsy Coder was informed that the remainder of the evaluation will be completed by another provider, this initial triage assessment does not replace that evaluation, and the importance of remaining in the ED until their evaluation is complete.     Cameron Ali, PA-C 05/11/23 1711

## 2023-05-12 ENCOUNTER — Inpatient Hospital Stay
Admission: EM | Admit: 2023-05-12 | Discharge: 2023-05-21 | DRG: 623 | Disposition: A | Discharge status: Home Health | Source: Ambulatory Visit | Attending: Student | Admitting: Student

## 2023-05-12 ENCOUNTER — Inpatient Hospital Stay

## 2023-05-12 ENCOUNTER — Encounter: Payer: Self-pay | Admitting: Internal Medicine

## 2023-05-12 DIAGNOSIS — E1142 Type 2 diabetes mellitus with diabetic polyneuropathy: Secondary | ICD-10-CM | POA: Diagnosis present

## 2023-05-12 DIAGNOSIS — E1165 Type 2 diabetes mellitus with hyperglycemia: Secondary | ICD-10-CM | POA: Diagnosis present

## 2023-05-12 DIAGNOSIS — I340 Nonrheumatic mitral (valve) insufficiency: Secondary | ICD-10-CM | POA: Diagnosis not present

## 2023-05-12 DIAGNOSIS — I10 Essential (primary) hypertension: Secondary | ICD-10-CM | POA: Diagnosis present

## 2023-05-12 DIAGNOSIS — L089 Local infection of the skin and subcutaneous tissue, unspecified: Secondary | ICD-10-CM | POA: Diagnosis not present

## 2023-05-12 DIAGNOSIS — E785 Hyperlipidemia, unspecified: Secondary | ICD-10-CM | POA: Diagnosis present

## 2023-05-12 DIAGNOSIS — Z9889 Other specified postprocedural states: Secondary | ICD-10-CM | POA: Diagnosis not present

## 2023-05-12 DIAGNOSIS — I739 Peripheral vascular disease, unspecified: Secondary | ICD-10-CM | POA: Insufficient documentation

## 2023-05-12 DIAGNOSIS — I330 Acute and subacute infective endocarditis: Secondary | ICD-10-CM

## 2023-05-12 DIAGNOSIS — Z79899 Other long term (current) drug therapy: Secondary | ICD-10-CM | POA: Diagnosis not present

## 2023-05-12 DIAGNOSIS — E11628 Type 2 diabetes mellitus with other skin complications: Secondary | ICD-10-CM | POA: Diagnosis present

## 2023-05-12 DIAGNOSIS — E0840 Diabetes mellitus due to underlying condition with diabetic neuropathy, unspecified: Secondary | ICD-10-CM | POA: Diagnosis present

## 2023-05-12 DIAGNOSIS — Z794 Long term (current) use of insulin: Secondary | ICD-10-CM | POA: Diagnosis not present

## 2023-05-12 DIAGNOSIS — G8929 Other chronic pain: Secondary | ICD-10-CM | POA: Diagnosis present

## 2023-05-12 DIAGNOSIS — Z89512 Acquired absence of left leg below knee: Secondary | ICD-10-CM | POA: Diagnosis not present

## 2023-05-12 DIAGNOSIS — E11621 Type 2 diabetes mellitus with foot ulcer: Secondary | ICD-10-CM | POA: Diagnosis present

## 2023-05-12 DIAGNOSIS — E559 Vitamin D deficiency, unspecified: Secondary | ICD-10-CM | POA: Diagnosis present

## 2023-05-12 DIAGNOSIS — Q2112 Patent foramen ovale: Secondary | ICD-10-CM | POA: Diagnosis not present

## 2023-05-12 DIAGNOSIS — Z7982 Long term (current) use of aspirin: Secondary | ICD-10-CM | POA: Diagnosis not present

## 2023-05-12 DIAGNOSIS — Z6833 Body mass index (BMI) 33.0-33.9, adult: Secondary | ICD-10-CM | POA: Diagnosis not present

## 2023-05-12 DIAGNOSIS — E1151 Type 2 diabetes mellitus with diabetic peripheral angiopathy without gangrene: Secondary | ICD-10-CM | POA: Diagnosis present

## 2023-05-12 DIAGNOSIS — R7881 Bacteremia: Secondary | ICD-10-CM | POA: Insufficient documentation

## 2023-05-12 DIAGNOSIS — G4733 Obstructive sleep apnea (adult) (pediatric): Secondary | ICD-10-CM | POA: Diagnosis present

## 2023-05-12 DIAGNOSIS — L97415 Non-pressure chronic ulcer of right heel and midfoot with muscle involvement without evidence of necrosis: Secondary | ICD-10-CM | POA: Diagnosis not present

## 2023-05-12 DIAGNOSIS — L84 Corns and callosities: Secondary | ICD-10-CM | POA: Diagnosis present

## 2023-05-12 DIAGNOSIS — F17210 Nicotine dependence, cigarettes, uncomplicated: Secondary | ICD-10-CM | POA: Diagnosis present

## 2023-05-12 DIAGNOSIS — L97419 Non-pressure chronic ulcer of right heel and midfoot with unspecified severity: Secondary | ICD-10-CM | POA: Diagnosis present

## 2023-05-12 DIAGNOSIS — D751 Secondary polycythemia: Secondary | ICD-10-CM | POA: Diagnosis present

## 2023-05-12 DIAGNOSIS — E66811 Obesity, class 1: Secondary | ICD-10-CM | POA: Diagnosis present

## 2023-05-12 DIAGNOSIS — B9561 Methicillin susceptible Staphylococcus aureus infection as the cause of diseases classified elsewhere: Secondary | ICD-10-CM

## 2023-05-12 DIAGNOSIS — M51369 Other intervertebral disc degeneration, lumbar region without mention of lumbar back pain or lower extremity pain: Secondary | ICD-10-CM | POA: Diagnosis present

## 2023-05-12 DIAGNOSIS — L03115 Cellulitis of right lower limb: Secondary | ICD-10-CM | POA: Diagnosis present

## 2023-05-12 DIAGNOSIS — I70234 Atherosclerosis of native arteries of right leg with ulceration of heel and midfoot: Secondary | ICD-10-CM | POA: Diagnosis not present

## 2023-05-12 LAB — BASIC METABOLIC PANEL
Anion gap: 4 — ABNORMAL LOW (ref 5–15)
BUN: 23 mg/dL (ref 8–23)
CO2: 22 mmol/L (ref 22–32)
Calcium: 8.4 mg/dL — ABNORMAL LOW (ref 8.9–10.3)
Chloride: 108 mmol/L (ref 98–111)
Creatinine, Ser: 1.04 mg/dL (ref 0.61–1.24)
GFR, Estimated: >60 mL/min (ref >60)
Glucose, Bld: 117 mg/dL — ABNORMAL HIGH (ref 70–99)
Potassium: 4.2 mmol/L (ref 3.5–5.1)
Sodium: 134 mmol/L — ABNORMAL LOW (ref 135–145)

## 2023-05-12 LAB — BLOOD CULTURE ID PANEL (REFLEXED) - BCID2

## 2023-05-12 LAB — SEDIMENTATION RATE: Sed Rate: 5 mm/h (ref 0–20)

## 2023-05-12 LAB — CBG MONITORING, ED
Glucose-Capillary: 116 mg/dL — ABNORMAL HIGH (ref 70–99)
Glucose-Capillary: 145 mg/dL — ABNORMAL HIGH (ref 70–99)
Glucose-Capillary: 223 mg/dL — ABNORMAL HIGH (ref 70–99)

## 2023-05-12 LAB — CBC
HCT: 47 % (ref 39.0–52.0)
Hemoglobin: 16.1 g/dL (ref 13.0–17.0)
MCH: 31.4 pg (ref 26.0–34.0)
MCHC: 34.3 g/dL (ref 30.0–36.0)
MCV: 91.8 fL (ref 80.0–100.0)
Platelets: 156 10*3/uL (ref 150–400)
RBC: 5.12 MIL/uL (ref 4.22–5.81)
RDW: 14.1 % (ref 11.5–15.5)
WBC: 6.3 10*3/uL (ref 4.0–10.5)
nRBC: 0 % (ref 0.0–0.2)

## 2023-05-12 LAB — GLUCOSE, CAPILLARY
Glucose-Capillary: 146 mg/dL — ABNORMAL HIGH (ref 70–99)
Glucose-Capillary: 182 mg/dL — ABNORMAL HIGH (ref 70–99)
Glucose-Capillary: 194 mg/dL — ABNORMAL HIGH (ref 70–99)

## 2023-05-12 LAB — PREALBUMIN: Prealbumin: 21 mg/dL (ref 18–38)

## 2023-05-12 LAB — VITAMIN B12: Vitamin B-12: 393 pg/mL (ref 180–914)

## 2023-05-12 LAB — MAGNESIUM: Magnesium: 2.2 mg/dL (ref 1.7–2.4)

## 2023-05-12 LAB — C-REACTIVE PROTEIN: CRP: 0.6 mg/dL (ref <1.0)

## 2023-05-12 LAB — PHOSPHORUS: Phosphorus: 3.1 mg/dL (ref 2.5–4.6)

## 2023-05-12 LAB — VITAMIN D 25 HYDROXY (VIT D DEFICIENCY, FRACTURES): Vit D, 25-Hydroxy: 31.14 ng/mL (ref 30–100)

## 2023-05-12 MED ORDER — PREGABALIN 75 MG PO CAPS
75.0000 mg | ORAL_CAPSULE | Freq: Every evening | ORAL | Status: DC
  Administered 2023-05-12 – 2023-05-20 (×9): 75 mg via ORAL
  Filled 2023-05-12 (×9): qty 1

## 2023-05-12 MED ORDER — ASPIRIN 325 MG PO TBEC
325.0000 mg | DELAYED_RELEASE_TABLET | Freq: Every day | ORAL | Status: DC
  Administered 2023-05-12 – 2023-05-21 (×9): 325 mg via ORAL
  Filled 2023-05-12 (×9): qty 1

## 2023-05-12 MED ORDER — OXYCODONE HCL 5 MG PO TABS
5.0000 mg | ORAL_TABLET | Freq: Four times a day (QID) | ORAL | Status: DC | PRN
  Administered 2023-05-12 – 2023-05-19 (×11): 5 mg via ORAL
  Filled 2023-05-12 (×12): qty 1

## 2023-05-12 MED ORDER — VANCOMYCIN HCL 1250 MG/250ML IV SOLN
1250.0000 mg | Freq: Two times a day (BID) | INTRAVENOUS | Status: DC
  Administered 2023-05-12 – 2023-05-13 (×2): 1250 mg via INTRAVENOUS
  Filled 2023-05-12 (×3): qty 250

## 2023-05-12 MED ORDER — CLINDAMYCIN PHOSPHATE 600 MG/50ML IV SOLN
600.0000 mg | Freq: Once | INTRAVENOUS | Status: AC
  Administered 2023-05-12: 600 mg via INTRAVENOUS
  Filled 2023-05-12: qty 50

## 2023-05-12 MED ORDER — ENOXAPARIN SODIUM 40 MG/0.4ML IJ SOSY
40.0000 mg | PREFILLED_SYRINGE | Freq: Every evening | INTRAMUSCULAR | Status: DC
Start: 2023-05-12 — End: 2023-05-12

## 2023-05-12 MED ORDER — VANCOMYCIN HCL 2000 MG/400ML IV SOLN
2000.0000 mg | Freq: Once | INTRAVENOUS | Status: AC
  Administered 2023-05-12: 2000 mg via INTRAVENOUS
  Filled 2023-05-12: qty 400

## 2023-05-12 MED ORDER — INSULIN ASPART 100 UNIT/ML IJ SOLN
0.0000 [IU] | INTRAMUSCULAR | Status: DC
  Administered 2023-05-12: 3 [IU] via SUBCUTANEOUS
  Administered 2023-05-12: 5 [IU] via SUBCUTANEOUS
  Administered 2023-05-12: 2 [IU] via SUBCUTANEOUS
  Administered 2023-05-12: 3 [IU] via SUBCUTANEOUS
  Administered 2023-05-12 – 2023-05-13 (×2): 2 [IU] via SUBCUTANEOUS
  Administered 2023-05-13: 15 [IU] via SUBCUTANEOUS
  Administered 2023-05-13: 5 [IU] via SUBCUTANEOUS
  Administered 2023-05-13: 3 [IU] via SUBCUTANEOUS
  Administered 2023-05-14: 8 [IU] via SUBCUTANEOUS
  Administered 2023-05-14: 2 [IU] via SUBCUTANEOUS
  Administered 2023-05-14: 8 [IU] via SUBCUTANEOUS
  Filled 2023-05-12 (×13): qty 1

## 2023-05-12 MED ORDER — INSULIN GLARGINE 100 UNIT/ML ~~LOC~~ SOLN
25.0000 [IU] | Freq: Every day | SUBCUTANEOUS | Status: DC
  Filled 2023-05-12: qty 0.25

## 2023-05-12 MED ORDER — SIMVASTATIN 20 MG PO TABS
40.0000 mg | ORAL_TABLET | Freq: Every evening | ORAL | Status: DC
  Administered 2023-05-12 – 2023-05-20 (×9): 40 mg via ORAL
  Filled 2023-05-12 (×9): qty 2

## 2023-05-12 MED ORDER — ONDANSETRON HCL 4 MG PO TABS
4.0000 mg | ORAL_TABLET | Freq: Four times a day (QID) | ORAL | Status: DC | PRN
Start: 2023-05-12 — End: 2023-05-21

## 2023-05-12 MED ORDER — MORPHINE SULFATE (PF) 4 MG/ML IV SOLN
4.0000 mg | Freq: Once | INTRAVENOUS | Status: AC
  Administered 2023-05-12: 4 mg via INTRAVENOUS
  Filled 2023-05-12: qty 1

## 2023-05-12 MED ORDER — ACETAMINOPHEN 325 MG PO TABS: 650.0000 mg | ORAL_TABLET | Freq: Four times a day (QID) | ORAL | Status: DC | PRN

## 2023-05-12 MED ORDER — ACETAMINOPHEN 650 MG RE SUPP: 650.0000 mg | Freq: Four times a day (QID) | RECTAL | Status: DC | PRN

## 2023-05-12 MED ORDER — ENOXAPARIN SODIUM 40 MG/0.4ML IJ SOSY: 40.0000 mg | PREFILLED_SYRINGE | Freq: Every evening | INTRAMUSCULAR | Status: DC

## 2023-05-12 MED ORDER — MORPHINE SULFATE (PF) 2 MG/ML IV SOLN
2.0000 mg | INTRAVENOUS | Status: DC | PRN
  Administered 2023-05-13 – 2023-05-21 (×9): 2 mg via INTRAVENOUS
  Filled 2023-05-12 (×10): qty 1

## 2023-05-12 MED ORDER — SODIUM CHLORIDE 0.9 % IV SOLN
3.0000 g | Freq: Four times a day (QID) | INTRAVENOUS | Status: AC
  Administered 2023-05-12 – 2023-05-13 (×5): 3 g via INTRAVENOUS
  Filled 2023-05-12 (×7): qty 8

## 2023-05-12 MED ORDER — INSULIN GLARGINE 100 UNIT/ML ~~LOC~~ SOLN: 25.0000 [IU] | Freq: Every day | SUBCUTANEOUS | Status: DC

## 2023-05-12 MED ORDER — ENOXAPARIN SODIUM 60 MG/0.6ML IJ SOSY
55.0000 mg | PREFILLED_SYRINGE | Freq: Every evening | INTRAMUSCULAR | Status: DC
  Administered 2023-05-14 – 2023-05-20 (×7): 55 mg via SUBCUTANEOUS
  Filled 2023-05-12 (×7): qty 0.6

## 2023-05-12 MED ORDER — ONDANSETRON HCL 4 MG/2ML IJ SOLN: 4.0000 mg | Freq: Four times a day (QID) | INTRAMUSCULAR | Status: DC | PRN

## 2023-05-12 MED ORDER — INSULIN GLARGINE 100 UNIT/ML ~~LOC~~ SOLN
25.0000 [IU] | Freq: Every day | SUBCUTANEOUS | Status: DC
  Administered 2023-05-12 – 2023-05-14 (×2): 25 [IU] via SUBCUTANEOUS
  Filled 2023-05-12 (×4): qty 0.25

## 2023-05-12 NOTE — Progress Notes (Signed)
 PHARMACY - PHYSICIAN COMMUNICATION CRITICAL VALUE ALERT - BLOOD CULTURE IDENTIFICATION (BCID)  Corey Foster is an 68 y.o. male who presented to [facility] on 05/12/2023 with a chief complaint of a non-healing ulcer, right heel, with X-ray showing no evidence of osteomyelitis.   Assessment:  Bcx 3/18: 2/3 GPC; BCID staphylococcus aureus, no resistance detected  Name of physician (or Provider) Contacted: Dr. Gillis Santa  Current antibiotics:  Unasyn 3g Q6H Vancomycin 1250 Q12H  Changes to prescribed antibiotics recommended:  Provider wants to keep vancomycin for now pending results of the heel wound culture, currently GPCs by gram stain.  Could consider cefazolin if no concerns for MRSA in the wound culture   Results for orders placed or performed during the hospital encounter of 05/12/23  Blood Culture ID Panel (Reflexed) (Collected: 05/12/2023  1:42 AM)  Result Value Ref Range   Enterococcus faecalis NOT DETECTED NOT DETECTED   Enterococcus Faecium NOT DETECTED NOT DETECTED   Listeria monocytogenes NOT DETECTED NOT DETECTED   Staphylococcus species DETECTED (A) NOT DETECTED   Staphylococcus aureus (BCID) DETECTED (A) NOT DETECTED   Staphylococcus epidermidis NOT DETECTED NOT DETECTED   Staphylococcus lugdunensis NOT DETECTED NOT DETECTED   Streptococcus species NOT DETECTED NOT DETECTED   Streptococcus agalactiae NOT DETECTED NOT DETECTED   Streptococcus pneumoniae NOT DETECTED NOT DETECTED   Streptococcus pyogenes NOT DETECTED NOT DETECTED   A.calcoaceticus-baumannii NOT DETECTED NOT DETECTED   Bacteroides fragilis NOT DETECTED NOT DETECTED   Enterobacterales NOT DETECTED NOT DETECTED   Enterobacter cloacae complex NOT DETECTED NOT DETECTED   Escherichia coli NOT DETECTED NOT DETECTED   Klebsiella aerogenes NOT DETECTED NOT DETECTED   Klebsiella oxytoca NOT DETECTED NOT DETECTED   Klebsiella pneumoniae NOT DETECTED NOT DETECTED   Proteus species NOT DETECTED NOT DETECTED    Salmonella species NOT DETECTED NOT DETECTED   Serratia marcescens NOT DETECTED NOT DETECTED   Haemophilus influenzae NOT DETECTED NOT DETECTED   Neisseria meningitidis NOT DETECTED NOT DETECTED   Pseudomonas aeruginosa NOT DETECTED NOT DETECTED   Stenotrophomonas maltophilia NOT DETECTED NOT DETECTED   Candida albicans NOT DETECTED NOT DETECTED   Candida auris NOT DETECTED NOT DETECTED   Candida glabrata NOT DETECTED NOT DETECTED   Candida krusei NOT DETECTED NOT DETECTED   Candida parapsilosis NOT DETECTED NOT DETECTED   Candida tropicalis NOT DETECTED NOT DETECTED   Cryptococcus neoformans/gattii NOT DETECTED NOT DETECTED   Meth resistant mecA/C and MREJ NOT DETECTED NOT DETECTED    Effie Shy, PharmD Pharmacy Resident  05/12/2023 8:56 PM

## 2023-05-12 NOTE — H&P (Addendum)
 History and Physical    Patient: Corey Foster:[id number] DOB: [birth year] DOA: 05/12/2023 DOS: the patient was seen and examined on 05/12/2023 PCP: Emogene Morgan, MD  Patient coming from: Home  Chief Complaint:  Chief Complaint  Patient presents with   Wound Check    HPI: Corey Foster is a 68 y.o. male with medical history significant for DM,  diabetic neuropathy, PVD s/p left BKA, sent by podiatry for evaluation of nonhealing ulcer right heel in spite of outpatient antibiotics and wound care.  He reports pain in the foot but denies fever or chills. ED course and data review: Vitals within normal limits Labs with normal WBC and lactic acid, normal creatinine, mostly unremarkable. X-ray of the foot showing soft tissue ulcer plantar aspect of heel without evidence of osteomyelitis Patient started on clindamycin Hospitalist consulted for admission.   Review of Systems: As mentioned in the history of present illness. All other systems reviewed and are negative.  Past Medical History:  Diagnosis Date   Arthritis    Diabetes mellitus without complication (HCC)    DJD (degenerative joint disease)    GERD (gastroesophageal reflux disease)    Hyperlipidemia    Hypertension    Sciatic nerve pain    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   Sleep apnea    Past Surgical History:  Procedure Laterality Date   AMPUTATION Left 11/29/2022   Procedure: AMPUTATION RAY, PARTIAL 1ST RAY;  Surgeon: Rosetta Posner, DPM;  Location: [facility];  Service: Orthopedics/Podiatry;  Laterality: Left;   AMPUTATION Left 12/19/2022   Procedure: FIRST LEFT RAY AMPUTATION;  Surgeon: Rosetta Posner, DPM;  Location: [facility];  Service: Orthopedics/Podiatry;  Laterality: Left;   AMPUTATION Left 01/09/2023   Procedure: AMPUTATION BELOW KNEE;  Surgeon: Annice Needy, MD;  Location: [facility];  Service: General;  Laterality: Left;   CYSTOSCOPY W/ RETROGRADES Bilateral 05/31/2019   Procedure: CYSTOSCOPY WITH RETROGRADE  PYELOGRAM;  Surgeon: Riki Altes, MD;  Location: [facility];  Service: Urology;  Laterality: Bilateral;   LOWER EXTREMITY ANGIOGRAPHY Left 11/28/2022   Procedure: Lower Extremity Angiography;  Surgeon: Annice Needy, MD;  Location: [facility];  Service: Cardiovascular;  Laterality: Left;   LOWER EXTREMITY ANGIOGRAPHY Left 12/18/2022   Procedure: Lower Extremity Angiography;  Surgeon: Annice Needy, MD;  Location: [facility];  Service: Cardiovascular;  Laterality: Left;   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-C N/A 05/31/2019   Procedure: 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 He has a 10 pack-year smoking history. He has never used smokeless tobacco. He reports current alcohol use of about 18.0 standard drinks of alcohol per week. He reports that he does not currently use drugs after having used the following drugs: Cocaine and Marijuana.  Allergies  Allergen Reactions   Nsaids Other (See Comments)    AKI AKI     Family History  Problem Relation Age of Onset   Diabetes Mother    Heart disease Mother    Diabetes Father    Heart disease Father    Diabetes Brother     Prior to Admission medications   Medication Sig Start Date End Date Taking? Authorizing Provider  ALPRAZolam Prudy Feeler) 1 MG tablet Take 1 tablet (1 mg total) by mouth 3 (three) times daily as needed for anxiety. 01/14/23  Gillis Santa, MD  AMBULATORY NON FORMULARY MEDICATION Trimix (30/1/10)-(Pap/Phent/PGE)  Dosage: Inject 0.3 cc and may increase 0.1cc to achieve and erection lasting no longer than 1 hour per injection   Vial 1ml  Qty #5 refills 2  [facility] [phone] Fax [phone] 02/13/22   Riki Altes, MD  aspirin EC 325 MG tablet Take 325 mg by mouth daily.    [provider]  baclofen (LIORESAL) 10 MG tablet Take 10 mg by mouth 2 (two) times daily.  10/07/13   [provider]  bisacodyl (DULCOLAX) 10 MG suppository Place 1 suppository (10 mg total) rectally daily as needed for severe constipation. 01/14/23   Gillis Santa, MD  bisacodyl (DULCOLAX) 5 MG EC tablet Take 2 tablets (10 mg total) by mouth at bedtime. 01/14/23   Gillis Santa, MD  cholecalciferol (VITAMIN D) 400 UNITS TABS tablet Take 10,000 Units by mouth daily.     [provider]  Continuous Glucose Sensor (FREESTYLE LIBRE 2 SENSOR) MISC  01/23/23   [provider]  furosemide (LASIX) 20 MG tablet Take 1 tablet (20 mg total) by mouth daily as needed for fluid or edema. 01/14/23   Gillis Santa, MD  JARDIANCE 25 MG TABS tablet Take 25 mg by mouth daily. 06/21/21   [provider]  naloxone (NARCAN) 0.4 MG/ML injection Inject as directed as directed    [provider]  NOVOLOG FLEXPEN 100 UNIT/ML FlexPen Inject 40 Units into the skin 3 (three) times daily with meals. 11/29/22   Marrion Coy, MD  Nutritional Supplements (KETO PO) Take 1 capsule by mouth.    [provider]  Omega-3 Fatty Acids (FISH OIL) 1000 MG CAPS Take 1 capsule by mouth daily.  01/14/12   [provider]  oxyCODONE (ROXICODONE) 5 MG immediate release tablet Take 1 tablet (5 mg total) by mouth every 6 (six) hours as needed for severe pain (pain score 7-10). 01/14/23   Gillis Santa, MD  polyethylene glycol (MIRALAX / GLYCOLAX) 17 g packet Take 17 g by mouth 2 (two) times daily. 01/14/23   Gillis Santa, MD  pregabalin (LYRICA) 75 MG capsule Take 1 capsule (75 mg total) by mouth every evening. 03/23/23 09/19/23  Georgiana Spinner, NP  sildenafil (REVATIO) 20 MG tablet TAKE 2 TO 5 TABLETS BY MOUTH 1 HOUR PRIOR TO INTERCOURSE 10/09/22   Stoioff, Verna Czech, MD  simvastatin (ZOCOR) 40 MG tablet Take 1 tablet (40 mg total) by mouth daily. 11/29/22   Marrion Coy, MD   sulfamethoxazole-trimethoprim (BACTRIM DS) 800-160 MG tablet Take 1 tablet by mouth 2 (two) times daily. 03/23/23   Georgiana Spinner, NP  Testosterone 20.25 MG/ACT (1.62%) GEL APPLY 4 APPLICATIONS TOPICALLY DAILY. 05/12/22   Stoioff, Verna Czech, MD  TOUJEO MAX SOLOSTAR 300 UNIT/ML Solostar Pen Inject 80-120 Units into the skin in the morning and at bedtime. Inject 80 Units into the skin daily. 80 units in the am and 120 units at bedtime    [provider]  vitamin C (ASCORBIC ACID) 250 MG tablet Take 250 mg by mouth daily.    [provider]    Physical Exam: Vitals:   05/12/23 0030 05/12/23 0100 05/12/23 0300 05/12/23 0330  BP: 138/85 (!) 177/117 115/76   Pulse: 97 91 88 88  Resp:      Temp:    98.1 F (36.7 C)  SpO2: 96% 98% 96% 97%  Weight:      Height:  Physical Exam Vitals and nursing note reviewed.  Constitutional:      General: He is not in acute distress. HENT:     Head: Normocephalic and atraumatic.  Cardiovascular:     Rate and Rhythm: Normal rate and regular rhythm.     Heart sounds: Normal heart sounds.  Pulmonary:     Effort: Pulmonary effort is normal.     Breath sounds: Normal breath sounds.  Abdominal:     Palpations: Abdomen is soft.     Tenderness: There is no abdominal tenderness.  Musculoskeletal:     Comments: Large deep ulcer right heel, redness swelling and warmth right lower leg.  See pics  Neurological:     Mental Status: Mental status is at baseline.          Labs on Admission: I have personally reviewed following labs and imaging studies  CBC: Recent Labs  Lab 05/11/23 1712  WBC 7.3  NEUTROABS 5.2  HGB 17.1*  HCT 50.8  MCV 93.2  PLT 188   Basic Metabolic Panel: Recent Labs  Lab 05/11/23 1712  NA 135  K 4.6  CL 110  CO2 17*  GLUCOSE 183*  BUN 29*  CREATININE 1.11  CALCIUM 8.5*   GFR: Estimated Creatinine Clearance: 80.3 mL/min (by C-G formula based on SCr of 1.11 mg/dL). Liver Function Tests: Recent  Labs  Lab 05/11/23 1712  AST 10*  ALT 20  ALKPHOS 58  BILITOT 0.5  PROT 7.1  ALBUMIN 3.1*   No results for input(s): "LIPASE", "AMYLASE" in the last 168 hours. No results for input(s): "AMMONIA" in the last 168 hours. Coagulation Profile: No results for input(s): "INR", "PROTIME" in the last 168 hours. Cardiac Enzymes: No results for input(s): "CKTOTAL", "CKMB", "CKMBINDEX", "TROPONINI" in the last 168 hours. BNP (last 3 results) No results for input(s): "PROBNP" in the last 8760 hours. HbA1C: No results for input(s): "HGBA1C" in the last 72 hours. CBG: No results for input(s): "GLUCAP" in the last 168 hours. Lipid Profile: No results for input(s): "CHOL", "HDL", "LDLCALC", "TRIG", "CHOLHDL", "LDLDIRECT" in the last 72 hours. Thyroid Function Tests: No results for input(s): "TSH", "T4TOTAL", "FREET4", "T3FREE", "THYROIDAB" in the last 72 hours. Anemia Panel: No results for input(s): "VITAMINB12", "FOLATE", "FERRITIN", "TIBC", "IRON", "RETICCTPCT" in the last 72 hours. Urine analysis:    Component Value Date/Time   COLORURINE YELLOW (A) 01/07/2023 1638   APPEARANCEUR CLEAR (A) 01/07/2023 1638   APPEARANCEUR Clear 08/19/2021 1501   LABSPEC 1.024 01/07/2023 1638   PHURINE 5.0 01/07/2023 1638   GLUCOSEU >=500 (A) 01/07/2023 1638   HGBUR NEGATIVE 01/07/2023 1638   BILIRUBINUR NEGATIVE 01/07/2023 1638   BILIRUBINUR Negative 08/19/2021 1501   KETONESUR NEGATIVE 01/07/2023 1638   PROTEINUR NEGATIVE 01/07/2023 1638   NITRITE NEGATIVE 01/07/2023 1638   LEUKOCYTESUR NEGATIVE 01/07/2023 1638    Radiological Exams on Admission: DG Foot Complete Right Result Date: 05/11/2023 CLINICAL DATA:  Right heel wound.  Diabetic foot ulcer. EXAM: RIGHT FOOT COMPLETE - 3+ VIEW COMPARISON:  None Available. FINDINGS: Vascular calcifications. Moderate hallux valgus deformity with mild secondary osteoarthritis. Skin defect about the plantar aspect of the calcaneus. No radiopaque foreign object. No  underlying osseous erosion IMPRESSION: Soft tissue ulcer about the plantar aspect of the heel without plain film evidence of osteomyelitis. Electronically Signed   By: Jeronimo Greaves M.D.   On: 05/11/2023 18:29     Data Reviewed: Relevant notes from primary care and specialist visits, past discharge summaries as available in EHR, including Care  Everywhere. Prior diagnostic testing as pertinent to current admission diagnoses Updated medications and problem lists for reconciliation ED course, including vitals, labs, imaging, treatment and response to treatment Triage notes, nursing and pharmacy notes and ED provider's notes Notable results as noted in HPI   Assessment and Plan: Diabetic foot ulcer right heel with infection (HCC) Cellulitis right lower extremity Zosyn and vancomycin Pain control Podiatry consult Vascular consult N.p.o. in case of procedure  PVD (peripheral vascular disease) (HCC) History of left BKA Continue aspirin and simvastatin  Diabetes mellitus due to underlying condition with diabetic neuropathy (HCC) Continue basal insulin Sliding scale insulin coverage        DVT prophylaxis: SCD in case of procedure  Consults: Vascular and podiatry  Advance Care Planning: full code  Family Communication: none  Disposition Plan: Back to previous home environment  Severity of Illness: The appropriate patient status for this patient is INPATIENT. Inpatient status is judged to be reasonable and necessary in order to provide the required intensity of service to ensure the patient's safety. The patient's presenting symptoms, physical exam findings, and initial radiographic and laboratory data in the context of their chronic comorbidities is felt to place them at high risk for further clinical deterioration. Furthermore, it is not anticipated that the patient will be medically stable for discharge from the hospital within 2 midnights of admission.   * I certify that at  the point of admission it is my clinical judgment that the patient will require inpatient hospital care spanning beyond 2 midnights from the point of admission due to high intensity of service, high risk for further deterioration and high frequency of surveillance required.*  Author: Andris Baumann, MD 05/12/2023 4:07 AM  For on call review [email or web address].

## 2023-05-12 NOTE — Assessment & Plan Note (Addendum)
 Cellulitis right lower extremity Zosyn and vancomycin Pain control Podiatry consult Vascular consult N.p.o. in case of procedure

## 2023-05-12 NOTE — Consult Note (Signed)
 ORTHOPAEDIC CONSULTATION  REQUESTING PHYSICIAN: Gillis Santa, MD  Chief Complaint: Right heel ulceration  HPI: Corey Foster is a 68 y.o. male who complains of worsening right heel ulceration.  Seen in the outpatient clinic yesterday with worsening ulcerative site.  Admitted for revascularization and further workup of this right heel.  Longstanding history of peripheral vascular disease.  Is status post left-sided BKA this past December.  Past Medical History:  Diagnosis Date   Arthritis    Diabetes mellitus without complication (HCC)    DJD (degenerative joint disease)    GERD (gastroesophageal reflux disease)    Hyperlipidemia    Hypertension    Sciatic nerve pain    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   Sleep apnea    Past Surgical History:  Procedure Laterality Date   AMPUTATION Left 11/29/2022   Procedure: AMPUTATION RAY, PARTIAL 1ST RAY;  Surgeon: Rosetta Posner, DPM;  Location: [facility];  Service: Orthopedics/Podiatry;  Laterality: Left;   AMPUTATION Left 12/19/2022   Procedure: FIRST LEFT RAY AMPUTATION;  Surgeon: Rosetta Posner, DPM;  Location: [facility];  Service: Orthopedics/Podiatry;  Laterality: Left;   AMPUTATION Left 01/09/2023   Procedure: AMPUTATION BELOW KNEE;  Surgeon: Annice Needy, MD;  Location: [facility];  Service: General;  Laterality: Left;   CYSTOSCOPY W/ RETROGRADES Bilateral 05/31/2019   Procedure: CYSTOSCOPY WITH RETROGRADE PYELOGRAM;  Surgeon: Riki Altes, MD;  Location: [facility];  Service: Urology;  Laterality: Bilateral;   LOWER EXTREMITY ANGIOGRAPHY Left 11/28/2022   Procedure: Lower Extremity Angiography;  Surgeon: Annice Needy, MD;  Location: [facility];  Service: Cardiovascular;  Laterality: Left;   LOWER EXTREMITY ANGIOGRAPHY Left 12/18/2022   Procedure: Lower Extremity Angiography;  Surgeon: Annice Needy, MD;  Location: [facility];  Service: Cardiovascular;  Laterality: Left;   TRANSURETHRAL RESECTION OF BLADDER TUMOR WITH  MITOMYCIN-C N/A 05/31/2019   Procedure: TRANSURETHRAL RESECTION OF BLADDER TUMOR WITH gemcitabine;  Surgeon: Riki Altes, MD;  Location: [facility];  Service: Urology;  Laterality: N/A;   Social History   Socioeconomic History   Marital status: Single    Spouse name: Not on file   Number of children: Not on file   Years of education: Not on file   Highest education level: Not on file  Occupational History   Not on file  Tobacco Use   Smoking status: Every Day    Current packs/day: 0.25    Average packs/day: 0.3 packs/day for 40.0 years (10.0 ttl pk-yrs)    Types: Cigarettes   Smokeless tobacco: Never  Vaping Use   Vaping status: Never Used  Substance and Sexual Activity   Alcohol use: Yes    Alcohol/week: 18.0 standard drinks of alcohol    Types: 12 Cans of beer, 6 Shots of liquor per week   Drug use: Not Currently    Types: Cocaine, Marijuana   Sexual activity: Yes  Other Topics Concern   Not on file  Social History Narrative   Not on file   Social Drivers of Health   Financial Resource Strain: Not on file  Food Insecurity: No Food Insecurity (05/12/2023)   Hunger Vital Sign    Worried About Running Out of Food in the Last Year: Never true    Ran Out of Food in the Last Year: Never true  Transportation Needs: No Transportation Needs (05/12/2023)   PRAPARE - Administrator, Civil Service (Medical): No    Lack of Transportation (Non-Medical): No  Physical Activity:  Not on file  Stress: Not on file  Social Connections: Unknown (05/12/2023)   Social Connection and Isolation Panel [NHANES]    Frequency of Communication with Friends and Family: More than three times a week    Frequency of Social Gatherings with Friends and Family: Three times a week    Attends Religious Services: 1 to 4 times per year    Active Member of Clubs or Organizations: No    Attends Banker Meetings: Never    Marital Status: Patient declined   Family History  Problem  Relation Age of Onset   Diabetes Mother    Heart disease Mother    Diabetes Father    Heart disease Father    Diabetes Brother    Allergies  Allergen Reactions   Nsaids Other (See Comments)    AKI AKI    Prior to Admission medications   Medication Sig Start Date End Date Taking? Authorizing Provider  ALPRAZolam Prudy Feeler) 1 MG tablet Take 1 tablet (1 mg total) by mouth 3 (three) times daily as needed for anxiety. 01/14/23   Gillis Santa, MD  AMBULATORY NON FORMULARY MEDICATION Trimix (30/1/10)-(Pap/Phent/PGE)  Dosage: Inject 0.3 cc and may increase 0.1cc to achieve and erection lasting no longer than 1 hour per injection   Vial 1ml  Qty #5 refills 2  [facility] [phone] Fax [phone] 02/13/22   Riki Altes, MD  aspirin EC 325 MG tablet Take 325 mg by mouth daily.    [provider]  baclofen (LIORESAL) 10 MG tablet Take 10 mg by mouth 2 (two) times daily.  10/07/13   [provider]  bisacodyl (DULCOLAX) 10 MG suppository Place 1 suppository (10 mg total) rectally daily as needed for severe constipation. 01/14/23   Gillis Santa, MD  bisacodyl (DULCOLAX) 5 MG EC tablet Take 2 tablets (10 mg total) by mouth at bedtime. 01/14/23   Gillis Santa, MD  cholecalciferol (VITAMIN D) 400 UNITS TABS tablet Take 10,000 Units by mouth daily.     [provider]  Continuous Glucose Sensor (FREESTYLE LIBRE 2 SENSOR) MISC  01/23/23   [provider]  furosemide (LASIX) 20 MG tablet Take 1 tablet (20 mg total) by mouth daily as needed for fluid or edema. 01/14/23   Gillis Santa, MD  JARDIANCE 25 MG TABS tablet Take 25 mg by mouth daily. 06/21/21   [provider]  naloxone (NARCAN) 0.4 MG/ML injection Inject as directed as directed    [provider]  NOVOLOG FLEXPEN 100 UNIT/ML FlexPen Inject 40 Units into the skin 3 (three) times daily with meals. 11/29/22   Marrion Coy, MD  Nutritional Supplements (KETO PO) Take 1  capsule by mouth.    [provider]  Omega-3 Fatty Acids (FISH OIL) 1000 MG CAPS Take 1 capsule by mouth daily.  01/14/12   [provider]  oxyCODONE (ROXICODONE) 5 MG immediate release tablet Take 1 tablet (5 mg total) by mouth every 6 (six) hours as needed for severe pain (pain score 7-10). 01/14/23   Gillis Santa, MD  polyethylene glycol (MIRALAX / GLYCOLAX) 17 g packet Take 17 g by mouth 2 (two) times daily. 01/14/23   Gillis Santa, MD  pregabalin (LYRICA) 75 MG capsule Take 1 capsule (75 mg total) by mouth every evening. 03/23/23 09/19/23  Georgiana Spinner, NP  sildenafil (REVATIO) 20 MG tablet TAKE 2 TO 5 TABLETS BY MOUTH 1 HOUR PRIOR TO INTERCOURSE 10/09/22   Stoioff, Verna Czech, MD  simvastatin (  ZOCOR) 40 MG tablet Take 1 tablet (40 mg total) by mouth daily. 11/29/22   Marrion Coy, MD  sulfamethoxazole-trimethoprim (BACTRIM DS) 800-160 MG tablet Take 1 tablet by mouth 2 (two) times daily. 03/23/23   Georgiana Spinner, NP  Testosterone 20.25 MG/ACT (1.62%) GEL APPLY 4 APPLICATIONS TOPICALLY DAILY. 05/12/22   Stoioff, Verna Czech, MD  TOUJEO MAX SOLOSTAR 300 UNIT/ML Solostar Pen Inject 80-120 Units into the skin in the morning and at bedtime. Inject 80 Units into the skin daily. 80 units in the am and 120 units at bedtime    [provider]  vitamin C (ASCORBIC ACID) 250 MG tablet Take 250 mg by mouth daily.    [provider]   DG Foot Complete Right Result Date: 05/11/2023 CLINICAL DATA:  Right heel wound.  Diabetic foot ulcer. EXAM: RIGHT FOOT COMPLETE - 3+ VIEW COMPARISON:  None Available. FINDINGS: Vascular calcifications. Moderate hallux valgus deformity with mild secondary osteoarthritis. Skin defect about the plantar aspect of the calcaneus. No radiopaque foreign object. No underlying osseous erosion IMPRESSION: Soft tissue ulcer about the plantar aspect of the heel without plain film evidence of osteomyelitis. Electronically Signed   By: Jeronimo Greaves M.D.   On:  05/11/2023 18:29    Positive ROS: All other systems have been reviewed and were otherwise negative with the exception of those mentioned in the HPI and as above.  12 point ROS was performed.  Physical Exam: General: Alert and oriented.  No apparent distress.  Vascular:  Left foot: Status post BKA  Right foot: Dorsalis Pedis:  absent Posterior Tibial:  absent  Neuro:absent protective sensation  Derm: Full-thickness ulceration about 2 cm in diameter to the plantar aspect of the right heel.  Surrounding erythema is minimal.  No purulent drainage.  Clear to bloody drainage noted to the ulcerative site.  Ortho/MS: Status post left-sided BKA.  I personally reviewed x-rays that were negative for obvious osteomyelitis.  Assessment: Necrotic right heel ulceration full-thickness Diabetes with neuropathy and peripheral vascular disease  Plan: Plan is for angio via vascular surgery tomorrow.  I am going to order an MRI of this right lower extremity just to rule out osteomyelitis and further evaluate this ulceration.  Dr. Excell Seltzer will continue to follow while in house.  Possible debridement to the right heel later this week pending results of MRI.  Bulky bandage applied to the right heel by myself today.    Irean Hong, DPM Cell [phone]   05/12/2023 1:01 PM

## 2023-05-12 NOTE — Plan of Care (Signed)
   Problem: Coping: Goal: Ability to adjust to condition or change in health will improve Outcome: Progressing   Problem: Fluid Volume: Goal: Ability to maintain a balanced intake and output will improve Outcome: Progressing

## 2023-05-12 NOTE — Consult Note (Signed)
 Hospital Consult    Reason for Consult:  Right Heel Ulcer Requesting Physician:  Dr Rosetta Posner MD  MRN #:  [id number]  History of Present Illness: This is a 68 y.o. male male with medical history significant for DM,  diabetic neuropathy, PVD s/p left BKA, sent by podiatry for evaluation of nonhealing ulcer right heel in spite of outpatient antibiotics and wound care.  He reports pain in the foot but denies fever or chills.    On exam this morning in the emergency department patient is resting comfortably in a stretcher.  Patient states that he will to his wound has been there for couple of months now.  He does endorse pain.  Unable to ambulate on his right foot at this time.  Scaler surgery consulted to evaluate prior to podiatry doing any surgery.  Past Medical History:  Diagnosis Date   Arthritis    Diabetes mellitus without complication (HCC)    DJD (degenerative joint disease)    GERD (gastroesophageal reflux disease)    Hyperlipidemia    Hypertension    Sciatic nerve pain    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   Sleep apnea     Past Surgical History:  Procedure Laterality Date   AMPUTATION Left 11/29/2022   Procedure: AMPUTATION RAY, PARTIAL 1ST RAY;  Surgeon: Rosetta Posner, DPM;  Location: [facility];  Service: Orthopedics/Podiatry;  Laterality: Left;   AMPUTATION Left 12/19/2022   Procedure: FIRST LEFT RAY AMPUTATION;  Surgeon: Rosetta Posner, DPM;  Location: [facility];  Service: Orthopedics/Podiatry;  Laterality: Left;   AMPUTATION Left 01/09/2023   Procedure: AMPUTATION BELOW KNEE;  Surgeon: Annice Needy, MD;  Location: [facility];  Service: General;  Laterality: Left;   CYSTOSCOPY W/ RETROGRADES Bilateral 05/31/2019   Procedure: CYSTOSCOPY WITH RETROGRADE PYELOGRAM;  Surgeon: Riki Altes, MD;  Location: [facility];  Service: Urology;  Laterality: Bilateral;   LOWER EXTREMITY ANGIOGRAPHY Left 11/28/2022   Procedure: Lower Extremity Angiography;  Surgeon: Annice Needy, MD;   Location: [facility];  Service: Cardiovascular;  Laterality: Left;   LOWER EXTREMITY ANGIOGRAPHY Left 12/18/2022   Procedure: Lower Extremity Angiography;  Surgeon: Annice Needy, MD;  Location: [facility];  Service: Cardiovascular;  Laterality: Left;   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 Authorizing Provider  ALPRAZolam Prudy Feeler) 1 MG tablet Take 1 tablet (1 mg total) by mouth 3 (three) times daily as needed for anxiety. 01/14/23   Gillis Santa, MD  AMBULATORY NON FORMULARY MEDICATION Trimix (30/1/10)-(Pap/Phent/PGE)  Dosage: Inject 0.3 cc and may increase 0.1cc to achieve and erection lasting no longer than 1 hour per injection   Vial 1ml  Qty #5 refills 2  [facility] [phone] Fax [phone] 02/13/22   Riki Altes, MD  aspirin EC 325 MG tablet Take 325 mg by mouth daily.    [provider]  baclofen (LIORESAL) 10 MG tablet Take 10 mg by mouth 2 (two) times daily.  10/07/13   [provider]  bisacodyl (DULCOLAX) 10 MG suppository Place 1 suppository (10 mg total) rectally daily as needed for severe constipation. 01/14/23   Lucianne Muss,  Dileep, MD  bisacodyl (DULCOLAX) 5 MG EC tablet Take 2 tablets (10 mg total) by mouth at bedtime. 01/14/23   Gillis Santa, MD  cholecalciferol (VITAMIN D) 400 UNITS TABS tablet Take 10,000 Units by mouth daily.     [provider]  Continuous Glucose Sensor (FREESTYLE LIBRE 2 SENSOR) MISC  01/23/23   [provider]  furosemide (LASIX) 20 MG tablet Take 1 tablet (20 mg total) by mouth daily as needed for fluid or edema. 01/14/23    Gillis Santa, MD  JARDIANCE 25 MG TABS tablet Take 25 mg by mouth daily. 06/21/21   [provider]  naloxone (NARCAN) 0.4 MG/ML injection Inject as directed as directed    [provider]  NOVOLOG FLEXPEN 100 UNIT/ML FlexPen Inject 40 Units into the skin 3 (three) times daily with meals. 11/29/22   Marrion Coy, MD  Nutritional Supplements (KETO PO) Take 1 capsule by mouth.    [provider]  Omega-3 Fatty Acids (FISH OIL) 1000 MG CAPS Take 1 capsule by mouth daily.  01/14/12   [provider]  oxyCODONE (ROXICODONE) 5 MG immediate release tablet Take 1 tablet (5 mg total) by mouth every 6 (six) hours as needed for severe pain (pain score 7-10). 01/14/23   Gillis Santa, MD  polyethylene glycol (MIRALAX / GLYCOLAX) 17 g packet Take 17 g by mouth 2 (two) times daily. 01/14/23   Gillis Santa, MD  pregabalin (LYRICA) 75 MG capsule Take 1 capsule (75 mg total) by mouth every evening. 03/23/23 09/19/23  Georgiana Spinner, NP  sildenafil (REVATIO) 20 MG tablet TAKE 2 TO 5 TABLETS BY MOUTH 1 HOUR PRIOR TO INTERCOURSE 10/09/22   Stoioff, Verna Czech, MD  simvastatin (ZOCOR) 40 MG tablet Take 1 tablet (40 mg total) by mouth daily. 11/29/22   Marrion Coy, MD  sulfamethoxazole-trimethoprim (BACTRIM DS) 800-160 MG tablet Take 1 tablet by mouth 2 (two) times daily. 03/23/23   Georgiana Spinner, NP  Testosterone 20.25 MG/ACT (1.62%) GEL APPLY 4 APPLICATIONS TOPICALLY DAILY. 05/12/22   Stoioff, Verna Czech, MD  TOUJEO MAX SOLOSTAR 300 UNIT/ML Solostar Pen Inject 80-120 Units into the skin in the morning and at bedtime. Inject 80 Units into the skin daily. 80 units in the am and 120 units at bedtime    [provider]  vitamin C (ASCORBIC ACID) 250 MG tablet Take 250 mg by mouth daily.    [provider]    Social History   Socioeconomic History   Marital status: Single    Spouse name: Not on file   Number of children: Not on file   Years of education: Not on file    Highest education level: Not on file  Occupational History   Not on file  Tobacco Use   Smoking status: Every Day    Current packs/day: 0.25    Average packs/day: 0.3 packs/day for 40.0 years (10.0 ttl pk-yrs)    Types: Cigarettes   Smokeless tobacco: Never  Vaping Use   Vaping status: Never Used  Substance and Sexual Activity   Alcohol use: Yes    Alcohol/week: 18.0 standard drinks of alcohol    Types: 12 Cans of beer, 6 Shots of liquor per week   Drug use: Not Currently    Types: Cocaine, Marijuana   Sexual activity: Yes  Other Topics Concern   Not on file  Social History Narrative   Not on file   Social Drivers of Health   Financial Resource Strain: Not  on file  Food Insecurity: No Food Insecurity (01/07/2023)   Hunger Vital Sign    Worried About Running Out of Food in the Last Year: Never true    Ran Out of Food in the Last Year: Never true  Transportation Needs: No Transportation Needs (01/07/2023)   PRAPARE - Administrator, Civil Service (Medical): No    Lack of Transportation (Non-Medical): No  Physical Activity: Not on file  Stress: Not on file  Social Connections: Not on file  Intimate Partner Violence: Not At Risk (01/07/2023)   Humiliation, Afraid, Rape, and Kick questionnaire    Fear of Current or Ex-Partner: No    Emotionally Abused: No    Physically Abused: No    Sexually Abused: No     Family History  Problem Relation Age of Onset   Diabetes Mother    Heart disease Mother    Diabetes Father    Heart disease Father    Diabetes Brother     ROS: Otherwise negative unless mentioned in HPI  Physical Examination  Vitals:   05/12/23 0525 05/12/23 0700  BP: 136/81 123/79  Pulse: 90 88  Resp: 20   Temp:    SpO2: 99% 99%   Body mass index is 33.75 kg/m.  General:  WDWN in NAD Gait: Not observed HENT: WNL, normocephalic Pulmonary: normal non-labored breathing, without Rales, rhonchi,  wheezing Cardiac: regular, without   Murmurs, rubs or gallops; without carotid bruits Abdomen: Positive bowel sounds throughout, soft, NT/ND, no masses Skin: without rashes Vascular Exam/Pulses: Right lower extremity cool to touch.  Unable to palpate DP or PT pulse.  History of left BKA. Extremities: with ischemic changes, without Gangrene , without cellulitis; with open wounds;  Musculoskeletal: no muscle wasting or atrophy  Neurologic: A&O X 3;  No focal weakness or paresthesias are detected; speech is fluent/normal Psychiatric:  The pt has Normal affect. Lymph:  Unremarkable  CBC    Component Value Date/Time   WBC 7.3 05/11/2023 1712   RBC 5.45 05/11/2023 1712   HGB 17.1 (H) 05/11/2023 1712   HGB 16.4 08/02/2013 0916   HCT 50.8 05/11/2023 1712   HCT 47.1 03/27/2020 1012   PLT 188 05/11/2023 1712   PLT 205 08/02/2013 0916   MCV 93.2 05/11/2023 1712   MCV 94 08/02/2013 0916   MCH 31.4 05/11/2023 1712   MCHC 33.7 05/11/2023 1712   RDW 14.1 05/11/2023 1712   RDW 15.5 (H) 08/02/2013 0916   LYMPHSABS 1.3 05/11/2023 1712   LYMPHSABS 2.1 08/02/2013 0916   MONOABS 0.8 05/11/2023 1712   MONOABS 1.2 (H) 08/02/2013 0916   EOSABS 0.1 05/11/2023 1712   EOSABS 0.1 08/02/2013 0916   BASOSABS 0.0 05/11/2023 1712   BASOSABS 0.1 08/02/2013 0916    BMET    Component Value Date/Time   NA 135 05/11/2023 1712   K 4.6 05/11/2023 1712   CL 110 05/11/2023 1712   CO2 17 (L) 05/11/2023 1712   GLUCOSE 183 (H) 05/11/2023 1712   BUN 29 (H) 05/11/2023 1712   CREATININE 1.11 05/11/2023 1712   CALCIUM 8.5 (L) 05/11/2023 1712   GFRNONAA >60 05/11/2023 1712   GFRAA 44 (L) 04/15/2019 1432    COAGS: Lab Results  Component Value Date   INR 1.0 12/17/2022   INR 1.0 02/07/2021     Non-Invasive Vascular Imaging:   None ordered.  Statin:  Yes.   Beta Blocker:  No. Aspirin:  Yes.   ACEI:  No. ARB:  No. CCB  use:  No Other antiplatelets/anticoagulants:  No.    ASSESSMENT/PLAN: This is a 68 y.o. male who was sent to [facility]  emergency department after being seen by podiatry for heel wound evaluation.  Patient has a nonhealing ulcer to his right heel despite outpatient antibiotics and wound care.  Podiatry would like an assessment of the patient's PAD.  Vascular surgery plans on taking the patient to the vascular lab on Wednesday, 05/13/2023 for right lower extremity angiogram with possible intervention.  I had a long detailed discussion with the patient at the bedside this morning in the emergency department concerning the procedure, benefits, risk, and complications.  Patient verbalizes understanding and endorses that he has had an angiogram in the past so he understands.  Patient would like to proceed to soon as possible.  I answered all the patient's questions this morning.  Patient will be made n.p.o. after midnight tonight for procedure tomorrow.   -I discussed the case in detail with Dr. [name] MD and he agrees with plan.   Marcie Bal [facility] 05/12/2023 7:17 AM

## 2023-05-12 NOTE — ED Notes (Signed)
 IV Team at bedside attempting to get access

## 2023-05-12 NOTE — ED Notes (Signed)
 Lab called to collect #2 set of blood cultures

## 2023-05-12 NOTE — ED Notes (Signed)
 Crackers PB and diet ginger ale provided.

## 2023-05-12 NOTE — ED Provider Notes (Signed)
 [facility] Provider Note    Event Date/Time   First MD Initiated Contact with Patient 05/12/23 0121     (approximate)   History   Wound Check   HPI  Corey Foster is a 68 y.o. male sent to the ED by his podiatrist Dr. Excell Seltzer for admission for diabetic right heel ulcer.  Patient reports he is on antibiotics currently with wound care.  Seen by podiatry who refers him for admission.  Reports neuropathy but complains of painful right foot.  Denies fever/chills, chest pain, shortness of breath, abdominal pain, nausea, vomiting or dizziness.     Past Medical History   Past Medical History:  Diagnosis Date   Arthritis    Diabetes mellitus without complication (HCC)    DJD (degenerative joint disease)    GERD (gastroesophageal reflux disease)    Hyperlipidemia    Hypertension    Sciatic nerve pain    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   Sleep apnea      Active Problem List   Patient Active Problem List   Diagnosis Date Noted   Diabetic foot infection (HCC) 05/12/2023   PVD (peripheral vascular disease) (HCC) 05/12/2023   Hx of BKA, left (HCC) 04/21/2023   Critical limb ischemia of left lower extremity (HCC) 01/08/2023   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) 01/07/2023   Diabetic foot ulcer associated with secondary diabetes mellitus (HCC) 12/17/2022   Dry gangrene (HCC) 12/17/2022   Wound infection after surgery 12/09/2022   Left foot pain 12/08/2022   Hypoglycemia 12/08/2022   Gangrene (HCC) 11/28/2022   Foot osteomyelitis, left (HCC) 11/27/2022   Hyperosmolar hyponatremia 11/27/2022   CKD stage 3b, GFR 30-44 ml/min (HCC) 11/27/2022   DKA (diabetic ketoacidosis) (HCC) 02/07/2021   Acute renal failure superimposed on stage 3a chronic kidney disease (HCC) 02/07/2021   HLD (hyperlipidemia) 02/07/2021   Anxiety 02/07/2021   Fall at home, initial encounter 02/07/2021   Hyperkalemia 02/07/2021   Elevated lactic acid level  02/07/2021   Type II diabetes mellitus with renal manifestations (HCC) 02/07/2021   Hyperglycemia    History of bladder cancer 09/05/2019   BMI 40.0-44.9, adult (HCC) 03/02/2017   Smoking 03/02/2017   Left-sided low back pain with left-sided sciatica 11/08/2014   Lumbar radiculopathy 11/08/2014   DDD (degenerative disc disease), lumbosacral 11/08/2014   Hip pain, chronic 11/08/2014   Sacro-iliac pain 11/08/2014   HTN (hypertension) 11/08/2014   Diabetes mellitus due to underlying condition with diabetic neuropathy (HCC) 11/08/2014   DDD (degenerative disc disease), cervical 11/08/2014   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   Incomplete bladder emptying 05/23/2013   Elevated prostate specific antigen (PSA) 04/12/2012   Benign prostatic hyperplasia with urinary obstruction 01/14/2012   Decreased libido 01/14/2012   Other long term (current) drug therapy 01/14/2012   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   Flu vaccine need 01/14/2012   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   Encounter for long-term (current) use of medications 01/14/2012     Past Surgical History   Past Surgical History:  Procedure Laterality Date   AMPUTATION Left 11/29/2022   Procedure: AMPUTATION RAY, PARTIAL 1ST RAY;  Surgeon: Rosetta Posner, DPM;  Location: [facility];  Service: Orthopedics/Podiatry;  Laterality: Left;   AMPUTATION Left 12/19/2022   Procedure: FIRST LEFT RAY AMPUTATION;  Surgeon: Rosetta Posner, DPM;  Location: [facility];  Service: Orthopedics/Podiatry;  Laterality: Left;   AMPUTATION Left 01/09/2023   Procedure: AMPUTATION BELOW KNEE;  Surgeon: Annice Needy, MD;  Location:  [facility];  Service: General;  Laterality: Left;   CYSTOSCOPY W/ RETROGRADES Bilateral 05/31/2019   Procedure: CYSTOSCOPY WITH RETROGRADE PYELOGRAM;  Surgeon: Riki Altes, MD;  Location: [facility];  Service: Urology;  Laterality: Bilateral;   LOWER EXTREMITY ANGIOGRAPHY Left 11/28/2022   Procedure: Lower Extremity  Angiography;  Surgeon: Annice Needy, MD;  Location: [facility];  Service: Cardiovascular;  Laterality: Left;   LOWER EXTREMITY ANGIOGRAPHY Left 12/18/2022   Procedure: Lower Extremity Angiography;  Surgeon: Annice Needy, MD;  Location: [facility];  Service: Cardiovascular;  Laterality: Left;   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-C N/A 05/31/2019   Procedure: TRANSURETHRAL RESECTION OF BLADDER TUMOR WITH gemcitabine;  Surgeon: Riki Altes, MD;  Location: [facility];  Service: Urology;  Laterality: N/A;     Home Medications   Prior to Admission medications   Medication Sig Start Date End Date Taking? Authorizing Provider  ALPRAZolam Prudy Feeler) 1 MG tablet Take 1 tablet (1 mg total) by mouth 3 (three) times daily as needed for anxiety. 01/14/23   Gillis Santa, MD  AMBULATORY NON FORMULARY MEDICATION Trimix (30/1/10)-(Pap/Phent/PGE)  Dosage: Inject 0.3 cc and may increase 0.1cc to achieve and erection lasting no longer than 1 hour per injection   Vial 1ml  Qty #5 refills 2  [facility] [phone] Fax [phone] 02/13/22   Riki Altes, MD  aspirin EC 325 MG tablet Take 325 mg by mouth daily.    [provider]  baclofen (LIORESAL) 10 MG tablet Take 10 mg by mouth 2 (two) times daily.  10/07/13   [provider]  bisacodyl (DULCOLAX) 10 MG suppository Place 1 suppository (10 mg total) rectally daily as needed for severe constipation. 01/14/23   Gillis Santa, MD  bisacodyl (DULCOLAX) 5 MG EC tablet Take 2 tablets (10 mg total) by mouth at bedtime. 01/14/23   Gillis Santa, MD  cholecalciferol (VITAMIN D) 400 UNITS TABS tablet Take 10,000 Units by mouth daily.     [provider]  Continuous Glucose Sensor (FREESTYLE LIBRE 2 SENSOR) MISC  01/23/23   [provider]  furosemide (LASIX) 20 MG tablet Take 1 tablet (20 mg total) by mouth daily as needed for fluid or edema. 01/14/23   Gillis Santa, MD   JARDIANCE 25 MG TABS tablet Take 25 mg by mouth daily. 06/21/21   [provider]  naloxone (NARCAN) 0.4 MG/ML injection Inject as directed as directed    [provider]  NOVOLOG FLEXPEN 100 UNIT/ML FlexPen Inject 40 Units into the skin 3 (three) times daily with meals. 11/29/22   Marrion Coy, MD  Nutritional Supplements (KETO PO) Take 1 capsule by mouth.    [provider]  Omega-3 Fatty Acids (FISH OIL) 1000 MG CAPS Take 1 capsule by mouth daily.  01/14/12   [provider]  oxyCODONE (ROXICODONE) 5 MG immediate release tablet Take 1 tablet (5 mg total) by mouth every 6 (six) hours as needed for severe pain (pain score 7-10). 01/14/23   Gillis Santa, MD  polyethylene glycol (MIRALAX / GLYCOLAX) 17 g packet Take 17 g by mouth 2 (two) times daily. 01/14/23   Gillis Santa, MD  pregabalin (LYRICA) 75 MG capsule Take 1 capsule (75 mg total) by mouth every evening. 03/23/23 09/19/23  Georgiana Spinner, NP  sildenafil (REVATIO) 20 MG tablet TAKE 2 TO 5 TABLETS BY MOUTH 1 HOUR PRIOR TO INTERCOURSE 10/09/22   Stoioff, Verna Czech, MD  simvastatin (ZOCOR) 40  MG tablet Take 1 tablet (40 mg total) by mouth daily. 11/29/22   Marrion Coy, MD  sulfamethoxazole-trimethoprim (BACTRIM DS) 800-160 MG tablet Take 1 tablet by mouth 2 (two) times daily. 03/23/23   Georgiana Spinner, NP  Testosterone 20.25 MG/ACT (1.62%) GEL APPLY 4 APPLICATIONS TOPICALLY DAILY. 05/12/22   Stoioff, Verna Czech, MD  TOUJEO MAX SOLOSTAR 300 UNIT/ML Solostar Pen Inject 80-120 Units into the skin in the morning and at bedtime. Inject 80 Units into the skin daily. 80 units in the am and 120 units at bedtime    [provider]  vitamin C (ASCORBIC ACID) 250 MG tablet Take 250 mg by mouth daily.    [provider]     Allergies  Nsaids   Family History   Family History  Problem Relation Age of Onset   Diabetes Mother    Heart disease Mother    Diabetes Father    Heart disease Father     Diabetes Brother      Physical Exam  Triage Vital Signs: ED Triage Vitals [05/11/23 1710]  Encounter Vitals Group     BP (!) 131/98     Systolic BP Percentile      Diastolic BP Percentile      Pulse Rate 100     Resp 18     Temp 98.3 F (36.8 C)     Temp src      SpO2 95 %     Weight 242 lb (109.8 kg)     Height 5\' 11"  (1.803 m)     Head Circumference      Peak Flow      Pain Score 8     Pain Loc      Pain Education      Exclude from Growth Chart     Updated Vital Signs: BP 115/76   Pulse 88   Temp 98.1 F (36.7 C)   Resp 18   Ht 5\' 11"  (1.803 m)   Wt 109.8 kg   SpO2 97%   BMI 33.75 kg/m    General: Awake, mild distress.  CV:  RRR.  Good peripheral perfusion.  Resp:  Normal effort.  CTAB. Abd:  No distention.  Other:  4x2cm right heel ulcer, minimal purulence, foul odor.  Palpable right distal pulses. Left BKA       ED Results / Procedures / Treatments  Labs (all labs ordered are listed, but only abnormal results are displayed) Labs Reviewed  COMPREHENSIVE METABOLIC PANEL - Abnormal; Notable for the following components:      Result Value   CO2 17 (*)    Glucose, Bld 183 (*)    BUN 29 (*)    Calcium 8.5 (*)    Albumin 3.1 (*)    AST 10 (*)    All other components within normal limits  CBC WITH DIFFERENTIAL/PLATELET - Abnormal; Notable for the following components:   Hemoglobin 17.1 (*)    All other components within normal limits  CULTURE, BLOOD (ROUTINE X 2)  CULTURE, BLOOD (ROUTINE X 2)  AEROBIC/ANAEROBIC CULTURE W GRAM STAIN (SURGICAL/DEEP WOUND)  LACTIC ACID, PLASMA  SEDIMENTATION RATE  C-REACTIVE PROTEIN  PREALBUMIN     EKG  None   RADIOLOGY I have independently visualized and interpreted patient's imaging study as well as noted the radiology interpretation:  Right foot x-ray: Soft tissue ulcer, no evidence of osteomyelitis  Official radiology report(s): DG Foot Complete Right Result Date: 05/11/2023 CLINICAL DATA:  Right  heel wound.  Diabetic foot ulcer. EXAM: RIGHT FOOT COMPLETE - 3+ VIEW COMPARISON:  None Available. FINDINGS: Vascular calcifications. Moderate hallux valgus deformity with mild secondary osteoarthritis. Skin defect about the plantar aspect of the calcaneus. No radiopaque foreign object. No underlying osseous erosion IMPRESSION: Soft tissue ulcer about the plantar aspect of the heel without plain film evidence of osteomyelitis. Electronically Signed   By: Jeronimo Greaves M.D.   On: 05/11/2023 18:29     PROCEDURES:  Critical Care performed: No  Procedures   MEDICATIONS ORDERED IN ED: Medications  aspirin EC tablet 325 mg (has no administration in time range)  oxyCODONE (Oxy IR/ROXICODONE) immediate release tablet 5 mg (has no administration in time range)  simvastatin (ZOCOR) tablet 40 mg (has no administration in time range)  insulin glargine (LANTUS) injection 25 Units (has no administration in time range)  pregabalin (LYRICA) capsule 75 mg (has no administration in time range)  acetaminophen (TYLENOL) tablet 650 mg (has no administration in time range)    Or  acetaminophen (TYLENOL) suppository 650 mg (has no administration in time range)  ondansetron (ZOFRAN) tablet 4 mg (has no administration in time range)    Or  ondansetron (ZOFRAN) injection 4 mg (has no administration in time range)  insulin aspart (novoLOG) injection 0-15 Units (has no administration in time range)  morphine (PF) 2 MG/ML injection 2 mg (has no administration in time range)  morphine (PF) 4 MG/ML injection 4 mg (4 mg Intravenous Given 05/12/23 0248)  clindamycin (CLEOCIN) IVPB 600 mg (0 mg Intravenous Stopped 05/12/23 0319)     IMPRESSION / MDM / ASSESSMENT AND PLAN / ED COURSE  I reviewed the triage vital signs and the nursing notes.                             68 year old male sent by podiatrist for nonhealing diabetic heel ulcer failed outpatient antibiotics.  Differential diagnosis includes but is not limited  to osteomyelitis, cellulitis, soft tissue infection, necrotizing fasciitis, etc.  I personally reviewed patient's records and notes podiatry office visit from yesterday.  Patient's presentation is most consistent with acute complicated illness / injury requiring diagnostic workup.  Laboratory results demonstrate normal WBC.  Lactic acid negative.  X-ray negative for osteomyelitis.  Will obtain wound culture, initiate broad-spectrum IV antibiotic, IV morphine for pain.  Will consult hospitalist services for evaluation and admission.      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   Irean Hong, MD 05/12/23 [phone]

## 2023-05-12 NOTE — Progress Notes (Signed)
 Pharmacy Antibiotic Note  Corey Foster is a 68 y.o. male admitted on 05/12/2023 with wound infection (diabetic foot).  Pharmacy has been consulted for Unasyn and Vancomycin dosing.  Plan: Unasyn 3 gm q6hr per indication & renal fxn.  Pt given Vancomycin 2000 mg once. Vancomycin 1250 mg IV Q 12 hrs. Goal AUC 400-550. Expected AUC: 520.9 SCr used: 520.9  Pharmacy will continue to follow and will adjust abx dosing whenever warranted.  Temp (24hrs), Avg:98.1 F (36.7 C), Min:98 F (36.7 C), Max:98.3 F (36.8 C)   Recent Labs  Lab 05/11/23 1712 05/11/23 1713  WBC 7.3  --   CREATININE 1.11  --   LATICACIDVEN  --  0.8    Estimated Creatinine Clearance: 80.3 mL/min (by C-G formula based on SCr of 1.11 mg/dL).    Allergies  Allergen Reactions   Nsaids Other (See Comments)    AKI AKI     Antimicrobials this admission: 3/18 Clindamycin >> x 1 dose 3/18 Unasyn >>  3/18 Vancomycin >>   Microbiology results: 3/18 BCx: Pending 3/18 WoundCx: Pending  Thank you for allowing pharmacy to be a part of this patient's care.  Otelia Sergeant, PharmD, [facility] 05/12/2023 4:36 AM

## 2023-05-12 NOTE — Progress Notes (Signed)
 PHARMACIST - PHYSICIAN COMMUNICATION  CONCERNING:  Enoxaparin (Lovenox) for DVT Prophylaxis    RECOMMENDATION: Patient was prescribed enoxaprin 40mg  q24 hours for VTE prophylaxis.   Filed Weights   05/11/23 1710  Weight: 109.8 kg (242 lb)    Body mass index is 33.75 kg/m.  Estimated Creatinine Clearance: 80.3 mL/min (by C-G formula based on SCr of 1.11 mg/dL).   Based on [facility] policy patient is candidate for enoxaparin 0.5mg /kg TBW SQ every 24 hours based on BMI being >30.  DESCRIPTION: Pharmacy has adjusted enoxaparin dose per [facility] policy.  Patient is now receiving enoxaparin 55 mg every 24 hours   Gardner Candle, PharmD, BCPS Clinical Pharmacist 05/12/2023 8:47 AM

## 2023-05-12 NOTE — Assessment & Plan Note (Signed)
 History of left BKA Continue aspirin and simvastatin

## 2023-05-12 NOTE — H&P (View-Only) (Signed)
 Hospital Consult    Reason for Consult:  Right Heel Ulcer Requesting Physician:  Dr Rosetta Posner MD  MRN #:  [id number]  History of Present Illness: This is a 68 y.o. male male with medical history significant for DM,  diabetic neuropathy, PVD s/p left BKA, sent by podiatry for evaluation of nonhealing ulcer right heel in spite of outpatient antibiotics and wound care.  He reports pain in the foot but denies fever or chills.    On exam this morning in the emergency department patient is resting comfortably in a stretcher.  Patient states that he will to his wound has been there for couple of months now.  He does endorse pain.  Unable to ambulate on his right foot at this time.  Scaler surgery consulted to evaluate prior to podiatry doing any surgery.  Past Medical History:  Diagnosis Date   Arthritis    Diabetes mellitus without complication (HCC)    DJD (degenerative joint disease)    GERD (gastroesophageal reflux disease)    Hyperlipidemia    Hypertension    Sciatic nerve pain    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   Sleep apnea     Past Surgical History:  Procedure Laterality Date   AMPUTATION Left 11/29/2022   Procedure: AMPUTATION RAY, PARTIAL 1ST RAY;  Surgeon: Rosetta Posner, DPM;  Location: [facility] ORS;  Service: Orthopedics/Podiatry;  Laterality: Left;   AMPUTATION Left 12/19/2022   Procedure: FIRST LEFT RAY AMPUTATION;  Surgeon: Rosetta Posner, DPM;  Location: [facility] OR[facility];  Service: Orthopedics/Podiatry;  Laterality: Left;   AMPUTATION Left 01/09/2023   Procedure: AMPUTATION BELOW KNEE;  Surgeon: Annice Needy, MD;  Location: [facility] ORS;  Service: General;  Laterality: Left;   CYSTOSCOPY W/ RETROGRADES Bilateral 05/31/2019   Procedure: CYSTOSCOPY WITH RETROGRADE PYELOGRAM;  Surgeon: Riki Altes, MD;  Location: [facility] OR[facility];  Service: Urology;  Laterality: Bilateral;   LOWER EXTREMITY ANGIOGRAPHY Left 11/28/2022   Procedure: Lower Extremity Angiography;  Surgeon: Annice Needy, MD;   Location: [facility];  Service: Cardiovascular;  Laterality: Left;   LOWER EXTREMITY ANGIOGRAPHY Left 12/18/2022   Procedure: Lower Extremity Angiography;  Surgeon: Annice Needy, MD;  Location: [facility];  Service: Cardiovascular;  Laterality: Left;   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 Authorizing Provider  ALPRAZolam Prudy Feeler) 1 MG tablet Take 1 tablet (1 mg total) by mouth 3 (three) times daily as needed for anxiety. 01/14/23   Gillis Santa, MD  AMBULATORY NON FORMULARY MEDICATION Trimix (30/1/10)-(Pap/Phent/PGE)  Dosage: Inject 0.3 cc and may increase 0.1cc to achieve and erection lasting no longer than 1 hour per injection   Vial 1ml  Qty #5 refills 2  [facility] [phone] Fax [phone] 02/13/22   Riki Altes, MD  aspirin EC 325 MG tablet Take 325 mg by mouth daily.    [provider]  baclofen (LIORESAL) 10 MG tablet Take 10 mg by mouth 2 (two) times daily.  10/07/13   [provider]  bisacodyl (DULCOLAX) 10 MG suppository Place 1 suppository (10 mg total) rectally daily as needed for severe constipation. 01/14/23   Lucianne Muss,  Dileep, MD  bisacodyl (DULCOLAX) 5 MG EC tablet Take 2 tablets (10 mg total) by mouth at bedtime. 01/14/23   Gillis Santa, MD  cholecalciferol (VITAMIN D) 400 UNITS TABS tablet Take 10,000 Units by mouth daily.     [provider]  Continuous Glucose Sensor (FREESTYLE LIBRE 2 SENSOR) MISC  01/23/23   [provider]  furosemide (LASIX) 20 MG tablet Take 1 tablet (20 mg total) by mouth daily as needed for fluid or edema. 01/14/23    Gillis Santa, MD  JARDIANCE 25 MG TABS tablet Take 25 mg by mouth daily. 06/21/21   [provider]  naloxone (NARCAN) 0.4 MG/ML injection Inject as directed as directed    [provider]  NOVOLOG FLEXPEN 100 UNIT/ML FlexPen Inject 40 Units into the skin 3 (three) times daily with meals. 11/29/22   Marrion Coy, MD  Nutritional Supplements (KETO PO) Take 1 capsule by mouth.    [provider]  Omega-3 Fatty Acids (FISH OIL) 1000 MG CAPS Take 1 capsule by mouth daily.  01/14/12   [provider]  oxyCODONE (ROXICODONE) 5 MG immediate release tablet Take 1 tablet (5 mg total) by mouth every 6 (six) hours as needed for severe pain (pain score 7-10). 01/14/23   Gillis Santa, MD  polyethylene glycol (MIRALAX / GLYCOLAX) 17 g packet Take 17 g by mouth 2 (two) times daily. 01/14/23   Gillis Santa, MD  pregabalin (LYRICA) 75 MG capsule Take 1 capsule (75 mg total) by mouth every evening. 03/23/23 09/19/23  Georgiana Spinner, NP  sildenafil (REVATIO) 20 MG tablet TAKE 2 TO 5 TABLETS BY MOUTH 1 HOUR PRIOR TO INTERCOURSE 10/09/22   Stoioff, Verna Czech, MD  simvastatin (ZOCOR) 40 MG tablet Take 1 tablet (40 mg total) by mouth daily. 11/29/22   Marrion Coy, MD  sulfamethoxazole-trimethoprim (BACTRIM DS) 800-160 MG tablet Take 1 tablet by mouth 2 (two) times daily. 03/23/23   Georgiana Spinner, NP  Testosterone 20.25 MG/ACT (1.62%) GEL APPLY 4 APPLICATIONS TOPICALLY DAILY. 05/12/22   Stoioff, Verna Czech, MD  TOUJEO MAX SOLOSTAR 300 UNIT/ML Solostar Pen Inject 80-120 Units into the skin in the morning and at bedtime. Inject 80 Units into the skin daily. 80 units in the am and 120 units at bedtime    [provider]  vitamin C (ASCORBIC ACID) 250 MG tablet Take 250 mg by mouth daily.    [provider]    Social History   Socioeconomic History   Marital status: Single    Spouse name: Not on file   Number of children: Not on file   Years of education: Not on file    Highest education level: Not on file  Occupational History   Not on file  Tobacco Use   Smoking status: Every Day    Current packs/day: 0.25    Average packs/day: 0.3 packs/day for 40.0 years (10.0 ttl pk-yrs)    Types: Cigarettes   Smokeless tobacco: Never  Vaping Use   Vaping status: Never Used  Substance and Sexual Activity   Alcohol use: Yes    Alcohol/week: 18.0 standard drinks of alcohol    Types: 12 Cans of beer, 6 Shots of liquor per week   Drug use: Not Currently    Types: Cocaine, Marijuana   Sexual activity: Yes  Other Topics Concern   Not on file  Social History Narrative   Not on file   Social Drivers of Health   Financial Resource Strain: Not  on file  Food Insecurity: No Food Insecurity (01/07/2023)   Hunger Vital Sign    Worried About Running Out of Food in the Last Year: Never true    Ran Out of Food in the Last Year: Never true  Transportation Needs: No Transportation Needs (01/07/2023)   PRAPARE - Administrator, Civil Service (Medical): No    Lack of Transportation (Non-Medical): No  Physical Activity: Not on file  Stress: Not on file  Social Connections: Not on file  Intimate Partner Violence: Not At Risk (01/07/2023)   Humiliation, Afraid, Rape, and Kick questionnaire    Fear of Current or Ex-Partner: No    Emotionally Abused: No    Physically Abused: No    Sexually Abused: No     Family History  Problem Relation Age of Onset   Diabetes Mother    Heart disease Mother    Diabetes Father    Heart disease Father    Diabetes Brother     ROS: Otherwise negative unless mentioned in HPI  Physical Examination  Vitals:   05/12/23 0525 05/12/23 0700  BP: 136/81 123/79  Pulse: 90 88  Resp: 20   Temp:    SpO2: 99% 99%   Body mass index is 33.75 kg/m.  General:  WDWN in NAD Gait: Not observed HENT: WNL, normocephalic Pulmonary: normal non-labored breathing, without Rales, rhonchi,  wheezing Cardiac: regular, without   Murmurs, rubs or gallops; without carotid bruits Abdomen: Positive bowel sounds throughout, soft, NT/ND, no masses Skin: without rashes Vascular Exam/Pulses: Right lower extremity cool to touch.  Unable to palpate DP or PT pulse.  History of left BKA. Extremities: with ischemic changes, without Gangrene , without cellulitis; with open wounds;  Musculoskeletal: no muscle wasting or atrophy  Neurologic: A&O X 3;  No focal weakness or paresthesias are detected; speech is fluent/normal Psychiatric:  The pt has Normal affect. Lymph:  Unremarkable  CBC    Component Value Date/Time   WBC 7.3 05/11/2023 1712   RBC 5.45 05/11/2023 1712   HGB 17.1 (H) 05/11/2023 1712   HGB 16.4 08/02/2013 0916   HCT 50.8 05/11/2023 1712   HCT 47.1 03/27/2020 1012   PLT 188 05/11/2023 1712   PLT 205 08/02/2013 0916   MCV 93.2 05/11/2023 1712   MCV 94 08/02/2013 0916   MCH 31.4 05/11/2023 1712   MCHC 33.7 05/11/2023 1712   RDW 14.1 05/11/2023 1712   RDW 15.5 (H) 08/02/2013 0916   LYMPHSABS 1.3 05/11/2023 1712   LYMPHSABS 2.1 08/02/2013 0916   MONOABS 0.8 05/11/2023 1712   MONOABS 1.2 (H) 08/02/2013 0916   EOSABS 0.1 05/11/2023 1712   EOSABS 0.1 08/02/2013 0916   BASOSABS 0.0 05/11/2023 1712   BASOSABS 0.1 08/02/2013 0916    BMET    Component Value Date/Time   NA 135 05/11/2023 1712   K 4.6 05/11/2023 1712   CL 110 05/11/2023 1712   CO2 17 (L) 05/11/2023 1712   GLUCOSE 183 (H) 05/11/2023 1712   BUN 29 (H) 05/11/2023 1712   CREATININE 1.11 05/11/2023 1712   CALCIUM 8.5 (L) 05/11/2023 1712   GFRNONAA >60 05/11/2023 1712   GFRAA 44 (L) 04/15/2019 1432    COAGS: Lab Results  Component Value Date   INR 1.0 12/17/2022   INR 1.0 02/07/2021     Non-Invasive Vascular Imaging:   None ordered.  Statin:  Yes.   Beta Blocker:  No. Aspirin:  Yes.   ACEI:  No. ARB:  No. CCB  use:  No Other antiplatelets/anticoagulants:  No.    ASSESSMENT/PLAN: This is a 68 y.o. male who was sent to [facility]  emergency department after being seen by podiatry for heel wound evaluation.  Patient has a nonhealing ulcer to his right heel despite outpatient antibiotics and wound care.  Podiatry would like an assessment of the patient's PAD.  Vascular surgery plans on taking the patient to the vascular lab on Wednesday, 05/13/2023 for right lower extremity angiogram with possible intervention.  I had a long detailed discussion with the patient at the bedside this morning in the emergency department concerning the procedure, benefits, risk, and complications.  Patient verbalizes understanding and endorses that he has had an angiogram in the past so he understands.  Patient would like to proceed to soon as possible.  I answered all the patient's questions this morning.  Patient will be made n.p.o. after midnight tonight for procedure tomorrow.   -I discussed the case in detail with Dr. [name] MD and he agrees with plan.   Marcie Bal [facility] 05/12/2023 7:17 AM

## 2023-05-12 NOTE — Assessment & Plan Note (Signed)
Continue basal insulin.  Sliding scale insulin coverage

## 2023-05-12 NOTE — Plan of Care (Signed)
 Patient was seen and examined at bedside, admitted due to right heel ulcer.  Complaining of chronic back pain due to DDD 8-9/10, feeling improvement after pain medications.  Patient has peripheral neuropathy, does not feel much pain in the right heel ulcer.  Patient was seen by vascular surgery plan is for angiogram tomorrow a.m.  Keep n.p.o. after midnight podiatry is on board. We will continue Unasyn and vancomycin for now.

## 2023-05-12 NOTE — ED Notes (Signed)
 Lab at bedside

## 2023-05-13 ENCOUNTER — Encounter
Admission: EM | Disposition: A | Discharge status: Home Health | Payer: Self-pay | Source: Ambulatory Visit | Attending: Student

## 2023-05-13 ENCOUNTER — Encounter: Payer: Self-pay | Admitting: Vascular Surgery

## 2023-05-13 DIAGNOSIS — E11621 Type 2 diabetes mellitus with foot ulcer: Secondary | ICD-10-CM | POA: Diagnosis not present

## 2023-05-13 DIAGNOSIS — L97419 Non-pressure chronic ulcer of right heel and midfoot with unspecified severity: Secondary | ICD-10-CM

## 2023-05-13 DIAGNOSIS — L97415 Non-pressure chronic ulcer of right heel and midfoot with muscle involvement without evidence of necrosis: Secondary | ICD-10-CM | POA: Diagnosis not present

## 2023-05-13 DIAGNOSIS — E1151 Type 2 diabetes mellitus with diabetic peripheral angiopathy without gangrene: Secondary | ICD-10-CM

## 2023-05-13 DIAGNOSIS — I70234 Atherosclerosis of native arteries of right leg with ulceration of heel and midfoot: Secondary | ICD-10-CM

## 2023-05-13 DIAGNOSIS — E11628 Type 2 diabetes mellitus with other skin complications: Secondary | ICD-10-CM | POA: Diagnosis not present

## 2023-05-13 DIAGNOSIS — Z89512 Acquired absence of left leg below knee: Secondary | ICD-10-CM | POA: Diagnosis not present

## 2023-05-13 DIAGNOSIS — B9561 Methicillin susceptible Staphylococcus aureus infection as the cause of diseases classified elsewhere: Secondary | ICD-10-CM

## 2023-05-13 DIAGNOSIS — R7881 Bacteremia: Secondary | ICD-10-CM | POA: Diagnosis not present

## 2023-05-13 DIAGNOSIS — Z9889 Other specified postprocedural states: Secondary | ICD-10-CM

## 2023-05-13 HISTORY — PX: LOWER EXTREMITY ANGIOGRAPHY: CATH118251

## 2023-05-13 LAB — GLUCOSE, CAPILLARY
Glucose-Capillary: 115 mg/dL — ABNORMAL HIGH (ref 70–99)
Glucose-Capillary: 146 mg/dL — ABNORMAL HIGH (ref 70–99)
Glucose-Capillary: 161 mg/dL — ABNORMAL HIGH (ref 70–99)
Glucose-Capillary: 238 mg/dL — ABNORMAL HIGH (ref 70–99)
Glucose-Capillary: 364 mg/dL — ABNORMAL HIGH (ref 70–99)
Glucose-Capillary: 89 mg/dL (ref 70–99)

## 2023-05-13 LAB — BASIC METABOLIC PANEL
Anion gap: 4 — ABNORMAL LOW (ref 5–15)
BUN: 21 mg/dL (ref 8–23)
CO2: 21 mmol/L — ABNORMAL LOW (ref 22–32)
Calcium: 8.4 mg/dL — ABNORMAL LOW (ref 8.9–10.3)
Chloride: 107 mmol/L (ref 98–111)
Creatinine, Ser: 1.1 mg/dL (ref 0.61–1.24)
GFR, Estimated: >60 mL/min (ref >60)
Glucose, Bld: 206 mg/dL — ABNORMAL HIGH (ref 70–99)
Potassium: 4.4 mmol/L (ref 3.5–5.1)
Sodium: 132 mmol/L — ABNORMAL LOW (ref 135–145)

## 2023-05-13 LAB — CBC
HCT: 46.1 % (ref 39.0–52.0)
Hemoglobin: 15.8 g/dL (ref 13.0–17.0)
MCH: 31.3 pg (ref 26.0–34.0)
MCHC: 34.3 g/dL (ref 30.0–36.0)
MCV: 91.3 fL (ref 80.0–100.0)
Platelets: 168 10*3/uL (ref 150–400)
RBC: 5.05 MIL/uL (ref 4.22–5.81)
RDW: 14 % (ref 11.5–15.5)
WBC: 5.5 10*3/uL (ref 4.0–10.5)
nRBC: 0 % (ref 0.0–0.2)

## 2023-05-13 LAB — PHOSPHORUS: Phosphorus: 3.2 mg/dL (ref 2.5–4.6)

## 2023-05-13 LAB — MAGNESIUM: Magnesium: 2 mg/dL (ref 1.7–2.4)

## 2023-05-13 SURGERY — LOWER EXTREMITY ANGIOGRAPHY
Anesthesia: Moderate Sedation | Laterality: Right

## 2023-05-13 MED ORDER — VITAMIN B-12 1000 MCG PO TABS
500.0000 ug | ORAL_TABLET | Freq: Every day | ORAL | Status: DC
  Administered 2023-05-14 – 2023-05-21 (×8): 500 ug via ORAL
  Filled 2023-05-13 (×9): qty 1

## 2023-05-13 MED ORDER — METHYLPREDNISOLONE SODIUM SUCC 125 MG IJ SOLR
125.0000 mg | Freq: Once | INTRAMUSCULAR | Status: DC | PRN
Start: 2023-05-13 — End: 2023-05-13

## 2023-05-13 MED ORDER — FENTANYL CITRATE (PF) 100 MCG/2ML IJ SOLN
INTRAMUSCULAR | Status: DC | PRN
  Administered 2023-05-13: 50 ug via INTRAVENOUS
  Administered 2023-05-13: 25 ug via INTRAVENOUS

## 2023-05-13 MED ORDER — CEFAZOLIN SODIUM-DEXTROSE 2-4 GM/100ML-% IV SOLN
2.0000 g | INTRAVENOUS | Status: AC
  Administered 2023-05-13: 2 g via INTRAVENOUS

## 2023-05-13 MED ORDER — HEPARIN SODIUM (PORCINE) 1000 UNIT/ML IJ SOLN
INTRAMUSCULAR | Status: AC
  Filled 2023-05-13: qty 10

## 2023-05-13 MED ORDER — ZINC SULFATE 220 (50 ZN) MG PO CAPS
220.0000 mg | ORAL_CAPSULE | Freq: Every day | ORAL | Status: DC
Start: 2023-05-13 — End: 2023-05-21
  Administered 2023-05-13 – 2023-05-21 (×9): 220 mg via ORAL
  Filled 2023-05-13 (×9): qty 1

## 2023-05-13 MED ORDER — FAMOTIDINE 20 MG PO TABS: 40.0000 mg | ORAL_TABLET | Freq: Once | ORAL | Status: DC | PRN

## 2023-05-13 MED ORDER — DIPHENHYDRAMINE HCL 50 MG/ML IJ SOLN: 50.0000 mg | Freq: Once | INTRAMUSCULAR | Status: DC | PRN

## 2023-05-13 MED ORDER — VITAMIN D (ERGOCALCIFEROL) 1.25 MG (50000 UNIT) PO CAPS
50000.0000 [IU] | ORAL_CAPSULE | ORAL | Status: DC
  Administered 2023-05-13 – 2023-05-20 (×2): 50000 [IU] via ORAL
  Filled 2023-05-13 (×2): qty 1

## 2023-05-13 MED ORDER — CEFAZOLIN SODIUM-DEXTROSE 2-4 GM/100ML-% IV SOLN
2.0000 g | Freq: Three times a day (TID) | INTRAVENOUS | Status: DC
  Administered 2023-05-13 – 2023-05-21 (×23): 2 g via INTRAVENOUS
  Filled 2023-05-13 (×23): qty 100

## 2023-05-13 MED ORDER — SODIUM CHLORIDE 0.9 % IV SOLN: INTRAVENOUS | Status: DC

## 2023-05-13 MED ORDER — LIVING WELL WITH DIABETES BOOK
Freq: Once | Status: AC
  Filled 2023-05-13: qty 1

## 2023-05-13 MED ORDER — LIDOCAINE-EPINEPHRINE (PF) 1 %-1:200000 IJ SOLN
INTRAMUSCULAR | Status: DC | PRN
  Administered 2023-05-13: 10 mL

## 2023-05-13 MED ORDER — FENTANYL CITRATE PF 50 MCG/ML IJ SOSY: 12.5000 ug | PREFILLED_SYRINGE | Freq: Once | INTRAMUSCULAR | Status: DC | PRN

## 2023-05-13 MED ORDER — VITAMIN C 500 MG PO TABS
500.0000 mg | ORAL_TABLET | Freq: Two times a day (BID) | ORAL | Status: DC
  Administered 2023-05-13 – 2023-05-21 (×15): 500 mg via ORAL
  Filled 2023-05-13 (×15): qty 1

## 2023-05-13 MED ORDER — METRONIDAZOLE 500 MG PO TABS
500.0000 mg | ORAL_TABLET | Freq: Two times a day (BID) | ORAL | Status: DC
  Administered 2023-05-13 – 2023-05-21 (×16): 500 mg via ORAL
  Filled 2023-05-13 (×16): qty 1

## 2023-05-13 MED ORDER — TRAZODONE HCL 50 MG PO TABS
25.0000 mg | ORAL_TABLET | Freq: Every evening | ORAL | Status: DC | PRN
  Administered 2023-05-13: 25 mg via ORAL
  Filled 2023-05-13: qty 1

## 2023-05-13 MED ORDER — IODIXANOL 320 MG/ML IV SOLN
INTRAVENOUS | Status: DC | PRN
  Administered 2023-05-13: 40 mL via INTRA_ARTERIAL

## 2023-05-13 MED ORDER — JUVEN PO PACK
1.0000 | PACK | Freq: Two times a day (BID) | ORAL | Status: DC
  Administered 2023-05-14 – 2023-05-21 (×12): 1 via ORAL

## 2023-05-13 MED ORDER — MIDAZOLAM HCL 2 MG/2ML IJ SOLN
INTRAMUSCULAR | Status: DC | PRN
  Administered 2023-05-13: 1 mg via INTRAVENOUS
  Administered 2023-05-13: 2 mg via INTRAVENOUS

## 2023-05-13 MED ORDER — FENTANYL CITRATE (PF) 100 MCG/2ML IJ SOLN
INTRAMUSCULAR | Status: AC
  Filled 2023-05-13: qty 2

## 2023-05-13 MED ORDER — ADULT MULTIVITAMIN W/MINERALS CH
1.0000 | ORAL_TABLET | Freq: Every day | ORAL | Status: DC
  Administered 2023-05-13 – 2023-05-21 (×9): 1 via ORAL
  Filled 2023-05-13 (×9): qty 1

## 2023-05-13 MED ORDER — MIDAZOLAM HCL 5 MG/5ML IJ SOLN
INTRAMUSCULAR | Status: AC
  Filled 2023-05-13: qty 5

## 2023-05-13 MED ORDER — HEPARIN (PORCINE) IN NACL 2000-0.9 UNIT/L-% IV SOLN
INTRAVENOUS | Status: DC | PRN
  Administered 2023-05-13: 1000 mL

## 2023-05-13 MED ORDER — MIDAZOLAM HCL 2 MG/ML PO SYRP: 8.0000 mg | ORAL_SOLUTION | Freq: Once | ORAL | Status: DC | PRN

## 2023-05-13 SURGICAL SUPPLY — 10 items
CATH ANGIO 5F PIGTAIL 65CM (CATHETERS) IMPLANT
CATH TEMPO 5F RIM 65CM (CATHETERS) IMPLANT
COVER PROBE ULTRASOUND 5X96 (MISCELLANEOUS) IMPLANT
DEVICE STARCLOSE SE CLOSURE (Vascular Products) IMPLANT
GLIDEWIRE ADV .035X260CM (WIRE) IMPLANT
PACK ANGIOGRAPHY (CUSTOM PROCEDURE TRAY) ×2 IMPLANT
SHEATH BRITE TIP 5FRX11 (SHEATH) IMPLANT
SYR MEDRAD MARK 7 150ML (SYRINGE) IMPLANT
TUBING CONTRAST HIGH PRESS 72 (TUBING) IMPLANT
WIRE J 3MM .035X145CM (WIRE) IMPLANT

## 2023-05-13 NOTE — Consult Note (Addendum)
 WOC Nurse Consult Note: Consult requested for Vac application.  Podiatry notes from today indicate dressing was just changed. Secure chat message sent as follows, "An order was just entered to apply A Vac for this patient. I am working on another campus today. Can I please apply it tomorrow and change Q Mon and Thurs?"  Podiatry agrees to application tomorrow and changes Q Mon and Thurs.   ATTN: Case management: Pt will need to discharge home with a Vac machine and home health assistance for Vac changes.  Thank-you,  Cammie Mcgee MSN, RN, CWOCN, [place], CNS [phone]

## 2023-05-13 NOTE — Progress Notes (Signed)
Patient reports chronic back pain.

## 2023-05-13 NOTE — Op Note (Signed)
 [facility]  Percutaneous Study/Intervention Procedural Note   Date of Surgery: 05/13/2023  Surgeon(s):Abie Killian    Assistants:none  Pre-operative Diagnosis: PAD with ulceration RLE, s/p  left BKA  Post-operative diagnosis:  Same  Procedure(s) Performed:             1.  Ultrasound guidance for vascular access left femoral artery             2.  Catheter placement into right SFA from left femoral approach             3.  Aortogram and selective right lower extremity angiogram             4.  StarClose closure device left femoral artery  EBL: 5 cc  Contrast: 40 cc  Fluoro Time: 3.8 minutes  Moderate Conscious Sedation Time: approximately 21 minutes using 3 mg of Versed and 75 mcg of Fentanyl              Indications:  Patient is a 68 y.o.male with a nonhealing ulceration on the right heel.  He is already status post left below-knee amputation and had significant tibial disease on the left side. The patient is brought in for angiography for further evaluation and potential treatment.  Due to the limb threatening nature of the situation, angiogram was performed for attempted limb salvage. The patient is aware that if the procedure fails, amputation would be expected.  The patient also understands that even with successful revascularization, amputation may still be required due to the severity of the situation.  Risks and benefits are discussed and informed consent is obtained.   Procedure:  The patient was identified and appropriate procedural time out was performed.  The patient was then placed supine on the table and prepped and draped in the usual sterile fashion. Moderate conscious sedation was administered during a face to face encounter with the patient throughout the procedure with my supervision of the RN administering medicines and monitoring the patient's vital signs, pulse oximetry, telemetry and mental status throughout from the start of the procedure  until the patient was taken to the recovery room. Ultrasound was used to evaluate the left common femoral artery.  It was patent .  A digital ultrasound image was acquired.  A Seldinger needle was used to access the left common femoral artery under direct ultrasound guidance and a permanent image was performed.  A 0.035 J wire was advanced without resistance and a 5Fr sheath was placed.  Pigtail catheter was placed into the aorta and an AP aortogram was performed. This demonstrated normal renal arteries and normal aorta and iliac segments without significant stenosis. I then crossed the aortic bifurcation with a rim catheter and advanced to the right femoral head.  After the initial image, advanced into the mid right SFA to opacify the tibial vessels better.  Selective right lower extremity angiogram was then performed. This demonstrated normal common femoral artery, profunda femoris artery, superficial femoral and popliteal arteries.  There was a high takeoff of the posterior tibial artery but this was large and continuous into the foot without focal stenosis.  The peroneal artery was also fairly large and continued down to the ankle with good collateral flow into the foot.  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 The patient had adequate perfusion for wound healing and no revascularization would be of benefit today.  I elected to terminate the procedure. The sheath was removed and StarClose closure device was deployed in the  left femoral artery with excellent hemostatic result. The patient was taken to the recovery room in stable condition having tolerated the procedure well.  Findings:               Aortogram:  This demonstrated normal renal arteries and normal aorta and iliac segments without significant stenosis.             Right Lower Extremity:  This demonstrated normal common femoral artery, profunda femoris artery, superficial femoral and popliteal arteries.  There was a high takeoff of  the posterior tibial artery but this was large and continuous into the foot without focal stenosis.  The peroneal artery was also fairly large and continued down to the ankle with good collateral flow into the foot.  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   Disposition: Patient was taken to the recovery room in stable condition having tolerated the procedure well.  Complications: None  [name] 05/13/2023 9:54 AM   This note was created with Dragon Medical transcription system. Any errors in dictation are purely unintentional.

## 2023-05-13 NOTE — Consult Note (Signed)
 NAME: Corey Foster  DOB: [birth year]  MRN: [id number]  Date/Time: 05/13/2023 1:31 PM  REQUESTING PROVIDER: Dr.Kumar Subjective:  REASON FOR CONSULT: MSSA bacteremia ? Corey Foster is a 68 y.o. 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 , bladder ca s/p TURBT has had an ulcer rt heel for few months and followed by podiatrist as OP, he was seen by Dr.Baker in his office on 05/11/23 and asked to get admitted for antibiotics and MRI  Pt has been on keflex since 3/12  05/11/23  BP 131/98 (H)  Temp 98.3 F (36.8 C)  Pulse Rate 100  Resp 18  SpO2 95 %     Latest Reference Range & Units 05/11/23  WBC 4.0 - 10.5 K/uL 7.3  Hemoglobin 13.0 - 17.0 g/dL 40.9 (H)  HCT 81.1 - 91.4 % 50.8  Platelets 150 - 400 K/uL 188  Creatinine 0.61 - 1.24 mg/dL 7.82   MRI of the rt foot- no osteo of the calcaneum Blood culture sent and started on Iv antibiotics Culture positive for MSSA and I am seeing the patient He also had angio and no intervention to the lower limb arteries of the rt leg yesterday In Oct 2024, he had balloon angioplasty of left PTA, left peroneal artery  Current smoker, uses cocaine ( no IVDA) occasional alcohol   Past Medical History:  Diagnosis Date   Arthritis    Diabetes mellitus without complication (HCC)    DJD (degenerative joint disease)    GERD (gastroesophageal reflux disease)    Hyperlipidemia    Hypertension    Sciatic nerve pain    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   Sleep apnea     Past Surgical History:  Procedure Laterality Date   AMPUTATION Left 11/29/2022   Procedure: AMPUTATION RAY, PARTIAL 1ST RAY;  Surgeon: Rosetta Posner, DPM;  Location: [facility];  Service: Orthopedics/Podiatry;  Laterality: Left;   AMPUTATION Left 12/19/2022   Procedure: FIRST LEFT RAY AMPUTATION;  Surgeon: Rosetta Posner, DPM;  Location: [facility];  Service: Orthopedics/Podiatry;  Laterality: Left;   AMPUTATION Left 01/09/2023   Procedure: AMPUTATION BELOW  KNEE;  Surgeon: Annice Needy, MD;  Location: [facility];  Service: General;  Laterality: Left;   CYSTOSCOPY W/ RETROGRADES Bilateral 05/31/2019   Procedure: CYSTOSCOPY WITH RETROGRADE PYELOGRAM;  Surgeon: Riki Altes, MD;  Location: [facility];  Service: Urology;  Laterality: Bilateral;   LOWER EXTREMITY ANGIOGRAPHY Left 11/28/2022   Procedure: Lower Extremity Angiography;  Surgeon: Annice Needy, MD;  Location: [facility];  Service: Cardiovascular;  Laterality: Left;   LOWER EXTREMITY ANGIOGRAPHY Left 12/18/2022   Procedure: Lower Extremity Angiography;  Surgeon: Annice Needy, MD;  Location: [facility];  Service: Cardiovascular;  Laterality: Left;   LOWER EXTREMITY ANGIOGRAPHY Right 05/13/2023   Procedure: Lower Extremity Angiography;  Surgeon: Annice Needy, MD;  Location: [facility];  Service: Cardiovascular;  Laterality: Right;   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-C N/A 05/31/2019   Procedure: TRANSURETHRAL RESECTION OF BLADDER TUMOR WITH gemcitabine;  Surgeon: Riki Altes, MD;  Location: [facility];  Service: Urology;  Laterality: N/A;    Social History   Socioeconomic History   Marital status: Single    Spouse name: Not on file   Number of children: Not on file   Years of education: Not on file   Highest education level: Not on file  Occupational History   Not on file  Tobacco  Use   Smoking status: Every Day    Current packs/day: 0.25    Average packs/day: 0.3 packs/day for 40.0 years (10.0 ttl pk-yrs)    Types: Cigarettes   Smokeless tobacco: Never  Vaping Use   Vaping status: Never Used  Substance and Sexual Activity   Alcohol use: Yes    Alcohol/week: 18.0 standard drinks of alcohol    Types: 12 Cans of beer, 6 Shots of liquor per week   Drug use: Not Currently    Types: Cocaine, Marijuana   Sexual activity: Yes  Other Topics Concern   Not on file  Social History Narrative   Not on file   Social Drivers of Health    Financial Resource Strain: Not on file  Food Insecurity: No Food Insecurity (05/12/2023)   Hunger Vital Sign    Worried About Running Out of Food in the Last Year: Never true    Ran Out of Food in the Last Year: Never true  Transportation Needs: No Transportation Needs (05/12/2023)   PRAPARE - Administrator, Civil Service (Medical): No    Lack of Transportation (Non-Medical): No  Physical Activity: Not on file  Stress: Not on file  Social Connections: Unknown (05/12/2023)   Social Connection and Isolation Panel [NHANES]    Frequency of Communication with Friends and Family: More than three times a week    Frequency of Social Gatherings with Friends and Family: Three times a week    Attends Religious Services: 1 to 4 times per year    Active Member of Clubs or Organizations: No    Attends Banker Meetings: Never    Marital Status: Patient declined  Intimate Partner Violence: Not At Risk (05/12/2023)   Humiliation, Afraid, Rape, and Kick questionnaire    Fear of Current or Ex-Partner: No    Emotionally Abused: No    Physically Abused: No    Sexually Abused: No    Family History  Problem Relation Age of Onset   Diabetes Mother    Heart disease Mother    Diabetes Father    Heart disease Father    Diabetes Brother    No Known Allergies I? Current Facility-Administered Medications  Medication Dose Route Frequency Provider Last Rate Last Admin   acetaminophen (TYLENOL) tablet 650 mg  650 mg Oral Q6H PRN Annice Needy, MD       Or   acetaminophen (TYLENOL) suppository 650 mg  650 mg Rectal Q6H PRN Wyn Quaker, Marlow Baars, MD       Ampicillin-Sulbactam (UNASYN) 3 g in sodium chloride 0.9 % 100 mL IVPB  3 g Intravenous Q6H Annice Needy, MD 200 mL/hr at 05/13/23 0455 3 g at 05/13/23 0455   aspirin EC tablet 325 mg  325 mg Oral Daily Annice Needy, MD   325 mg at 05/12/23 1009   cyanocobalamin (VITAMIN B12) tablet 500 mcg  500 mcg Oral Daily Annice Needy, MD       [START  ON 05/14/2023] enoxaparin (LOVENOX) injection 55 mg  55 mg Subcutaneous QPM Dew, Marlow Baars, MD       insulin aspart (novoLOG) injection 0-15 Units  0-15 Units Subcutaneous Q4H Annice Needy, MD   3 Units at 05/13/23 0450   insulin glargine (LANTUS) injection 25 Units  25 Units Subcutaneous Daily Annice Needy, MD   25 Units at 05/12/23 1009   living well with diabetes book MISC   Does not apply Once Gillis Santa, MD  morphine (PF) 2 MG/ML injection 2 mg  2 mg Intravenous Q2H PRN Annice Needy, MD       ondansetron [facility]) tablet 4 mg  4 mg Oral Q6H PRN Annice Needy, MD       Or   ondansetron (ZOFRAN) injection 4 mg  4 mg Intravenous Q6H PRN Annice Needy, MD       oxyCODONE (Oxy IR/ROXICODONE) immediate release tablet 5 mg  5 mg Oral Q6H PRN Annice Needy, MD   5 mg at 05/13/23 1104   pregabalin (LYRICA) capsule 75 mg  75 mg Oral QPM Annice Needy, MD   75 mg at 05/12/23 1654   simvastatin (ZOCOR) tablet 40 mg  40 mg Oral QPM Annice Needy, MD   40 mg at 05/12/23 1655   vancomycin (VANCOREADY) IVPB 1250 mg/250 mL  1,250 mg Intravenous Q12H Annice Needy, MD 166.7 mL/hr at 05/13/23 0546 1,250 mg at 05/13/23 0546   Vitamin D (Ergocalciferol) (DRISDOL) 1.25 MG (50000 UNIT) capsule 50,000 Units  50,000 Units Oral Q7 days Annice Needy, MD         Abtx:  Anti-infectives (From admission, onward)    Start     Dose/Rate Route Frequency Ordered Stop   05/13/23 0803  ceFAZolin (ANCEF) IVPB 2g/100 mL premix        2 g 200 mL/hr over 30 Minutes Intravenous 30 min pre-op 05/13/23 0803 05/13/23 1000   05/12/23 1800  vancomycin (VANCOREADY) IVPB 1250 mg/250 mL       Placed in "Followed by" Linked Group   1,250 mg 166.7 mL/hr over 90 Minutes Intravenous Every 12 hours 05/12/23 0436     05/12/23 0600  Ampicillin-Sulbactam (UNASYN) 3 g in sodium chloride 0.9 % 100 mL IVPB        3 g 200 mL/hr over 30 Minutes Intravenous Every 6 hours 05/12/23 0434     05/12/23 0600  vancomycin (VANCOREADY) IVPB 2000 mg/400 mL        Placed in "Followed by" Linked Group   2,000 mg 200 mL/hr over 120 Minutes Intravenous  Once 05/12/23 0436 05/12/23 1006   05/12/23 0130  clindamycin (CLEOCIN) IVPB 600 mg        600 mg 100 mL/hr over 30 Minutes Intravenous  Once 05/12/23 0129 05/12/23 0319       REVIEW OF SYSTEMS:  Const: negative fever, negative chills, negative weight loss Eyes: negative diplopia or visual changes, negative eye pain ENT: negative coryza, negative sore throat Resp: negative cough, hemoptysis, dyspnea Cards: negative for chest pain, palpitations, lower extremity edema GU: negative for frequency, dysuria and hematuria GI: Negative for abdominal pain, diarrhea, bleeding, constipation Skin: negative for rash and pruritus Heme: negative for easy bruising and gum/nose bleeding MS: weakness Neurolo:negative for headaches, dizziness, vertigo, memory problems  Psych:  anxiety, depression  Endocrine:  diabetes Allergy/Immunology- negative for any medication or food allergies ? Objective:  VITALS:  BP 124/70 (BP Location: Right Wrist)   Pulse 81   Temp 98 F (36.7 C) (Oral)   Resp 16   Ht 5\' 11"  (1.803 m)   Wt 109.8 kg   SpO2 97%   BMI 33.75 kg/m  L PHYSICAL EXAM:  General: Alert, cooperative, no distress, appears stated age. Chronically ill Head: Normocephalic, without obvious abnormality, atraumatic. Eyes: Conjunctivae clear, anicteric sclerae. Pupils are equal ENT Nares normal. No drainage or sinus tenderness. Lips, mucosa, and tongue normal. No Thrush Neck:  symmetrical, no adenopathy, thyroid: non  tender no carotid bruit and no JVD.  Lungs: b/l air entry Heart: s1s2 Abdomen: Soft, non-tender,not distended. Bowel sounds normal. No masses Extremities: left BKA Rt heel ulcer     Skin: No rashes or lesions. Or bruising Lymph: Cervical, supraclavicular normal. Neurologic: peripheral neuropathy Pertinent Labs Lab Results CBC    Component Value Date/Time   WBC 5.5  05/13/2023 0111   RBC 5.05 05/13/2023 0111   HGB 15.8 05/13/2023 0111   HGB 16.4 08/02/2013 0916   HCT 46.1 05/13/2023 0111   HCT 47.1 03/27/2020 1012   PLT 168 05/13/2023 0111   PLT 205 08/02/2013 0916   MCV 91.3 05/13/2023 0111   MCV 94 08/02/2013 0916   MCH 31.3 05/13/2023 0111   MCHC 34.3 05/13/2023 0111   RDW 14.0 05/13/2023 0111   RDW 15.5 (H) 08/02/2013 0916   LYMPHSABS 1.3 05/11/2023 1712   LYMPHSABS 2.1 08/02/2013 0916   MONOABS 0.8 05/11/2023 1712   MONOABS 1.2 (H) 08/02/2013 0916   EOSABS 0.1 05/11/2023 1712   EOSABS 0.1 08/02/2013 0916   BASOSABS 0.0 05/11/2023 1712   BASOSABS 0.1 08/02/2013 0916       Latest Ref Rng & Units 05/13/2023    1:11 AM 05/12/2023    8:46 AM 05/11/2023    5:12 PM  CMP  Glucose 70 - 99 mg/dL 213  086  578   BUN 8 - 23 mg/dL 21  23  29    Creatinine 0.61 - 1.24 mg/dL 4.69  6.29  5.28   Sodium 135 - 145 mmol/L 132  134  135   Potassium 3.5 - 5.1 mmol/L 4.4  4.2  4.6   Chloride 98 - 111 mmol/L 107  108  110   CO2 22 - 32 mmol/L 21  22  17    Calcium 8.9 - 10.3 mg/dL 8.4  8.4  8.5   Total Protein 6.5 - 8.1 g/dL   7.1   Total Bilirubin 0.0 - 1.2 mg/dL   0.5   Alkaline Phos 38 - 126 U/L   58   AST 15 - 41 U/L   10   ALT 0 - 44 U/L   20       Microbiology: Recent Results (from the past 240 hours)  Culture, blood (routine x 2)     Status: None (Preliminary result)   Collection Time: 05/12/23  1:42 AM   Specimen: BLOOD  Result Value Ref Range Status   Specimen Description   Final    BLOOD LAC Performed at [facility], [address] [place], [place] [address]    Special Requests   Final    BOTTLES DRAWN AEROBIC AND ANAEROBIC Blood Culture results may not be optimal due to an inadequate volume of blood received in culture bottles Performed at [facility], [address] [place], [place] [address]    Culture  Setup Time   Final    GRAM POSITIVE COCCI IN BOTH AEROBIC AND ANAEROBIC BOTTLES CRITICAL RESULT  CALLED TO, READ BACK BY AND VERIFIED WITH: ANDREA DOBBS AT 1905 ON 05/12/23 BY SS    Culture GRAM POSITIVE COCCI  Final   Report Status PENDING  Incomplete  Aerobic/Anaerobic Culture w Gram Stain (surgical/deep wound)     Status: None (Preliminary result)   Collection Time: 05/12/23  1:42 AM   Specimen: Heel  Result Value Ref Range Status   Specimen Description   Final    HEEL Performed at [facility], [address] [place],  [place] [address]    Special Requests   Final    NONE Performed at [facility], [address] [place], [place] [address]    Gram Stain   Final    FEW WBC PRESENT, PREDOMINANTLY MONONUCLEAR FEW GRAM POSITIVE COCCI IN PAIRS IN SINGLES    Culture   Final    CULTURE REINCUBATED FOR BETTER GROWTH Performed at [facility], [address] [place], [place] [address]    Report Status PENDING  Incomplete  Blood Culture ID Panel (Reflexed)     Status: Abnormal   Collection Time: 05/12/23  1:42 AM  Result Value Ref Range Status   Enterococcus faecalis NOT DETECTED NOT DETECTED Final   Enterococcus Faecium NOT DETECTED NOT DETECTED Final   Listeria monocytogenes NOT DETECTED NOT DETECTED Final   Staphylococcus species DETECTED (A) NOT DETECTED Final    Comment: CRITICAL RESULT CALLED TO, READ BACK BY AND VERIFIED WITH: ANDREA DOBBS AT 1905 ON 05/12/23 BY SS    Staphylococcus aureus (BCID) DETECTED (A) NOT DETECTED Final    Comment: CRITICAL RESULT CALLED TO, READ BACK BY AND VERIFIED WITH: ANDREA DOBBS AT 1905 ON 05/12/23 BY SS    Staphylococcus epidermidis NOT DETECTED NOT DETECTED Final   Staphylococcus lugdunensis NOT DETECTED NOT DETECTED Final   Streptococcus species NOT DETECTED NOT DETECTED Final   Streptococcus agalactiae NOT DETECTED NOT DETECTED Final   Streptococcus pneumoniae NOT DETECTED NOT DETECTED Final   Streptococcus pyogenes NOT DETECTED NOT DETECTED Final   A.calcoaceticus-baumannii NOT DETECTED NOT DETECTED  Final   Bacteroides fragilis NOT DETECTED NOT DETECTED Final   Enterobacterales NOT DETECTED NOT DETECTED Final   Enterobacter cloacae complex NOT DETECTED NOT DETECTED Final   Escherichia coli NOT DETECTED NOT DETECTED Final   Klebsiella aerogenes NOT DETECTED NOT DETECTED Final   Klebsiella oxytoca NOT DETECTED NOT DETECTED Final   Klebsiella pneumoniae NOT DETECTED NOT DETECTED Final   Proteus species NOT DETECTED NOT DETECTED Final   Salmonella species NOT DETECTED NOT DETECTED Final   Serratia marcescens NOT DETECTED NOT DETECTED Final   Haemophilus influenzae NOT DETECTED NOT DETECTED Final   Neisseria meningitidis NOT DETECTED NOT DETECTED Final   Pseudomonas aeruginosa NOT DETECTED NOT DETECTED Final   Stenotrophomonas maltophilia NOT DETECTED NOT DETECTED Final   Candida albicans NOT DETECTED NOT DETECTED Final   Candida auris NOT DETECTED NOT DETECTED Final   Candida glabrata NOT DETECTED NOT DETECTED Final   Candida krusei NOT DETECTED NOT DETECTED Final   Candida parapsilosis NOT DETECTED NOT DETECTED Final   Candida tropicalis NOT DETECTED NOT DETECTED Final   Cryptococcus neoformans/gattii NOT DETECTED NOT DETECTED Final   Meth resistant mecA/C and MREJ NOT DETECTED NOT DETECTED Final    Comment: Performed at [facility], [address] [place], [place] [address]  Culture, blood (routine x 2)     Status: None (Preliminary result)   Collection Time: 05/12/23  3:00 AM   Specimen: BLOOD  Result Value Ref Range Status   Specimen Description BLOOD LEFT HAND  Final   Special Requests   Final    BOTTLES DRAWN AEROBIC ONLY Blood Culture adequate volume   Culture   Final    NO GROWTH 1 DAY Performed at [facility], [address] [place] [address] [place], [place] [address]    Report Status PENDING  Incomplete    IMAGING RESULTS: MRI of the rt foot reviewed- no osteo I have personally reviewed the films ? Impression/Recommendation ? ?Diabetes mellulits  with  peripheral neuropathy with ulcer rt heel with infection  Staph aureus bacteremia 1 of 2 culture- time to positivity around 18 hours   Source could be the rt foot ulcer Need 2 d echo May need TEE as we dont know the duration of the Staph bacteremia After 48 hrs of antibiotic will repeat blood culture  DM on insulin  PAD  Cocaine use  Current smoker  Secondary erythrocytosis   ? _I have personally spent  75--minutes involved in face-to-face and non-face-to-face activities for this patient on the day of the visit. Professional time spent includes the following activities: Preparing to see the patient (review of tests), Obtaining and/or reviewing separately obtained history (admission/discharge record), Performing a medically appropriate examination and/or evaluation , Ordering medications/tests/procedures, referring and communicating with other health care professionals, Documenting clinical information in the EMR, Independently interpreting results (not separately reported), Communicating results to the patient/family/caregiver, Counseling and educating the patient/family/caregiver and Care coordination (not separately reported). This involved complicated antimicrobial counseling and management   ________________________________________________  Note:  This document was prepared using Dragon voice recognition software and may include unintentional dictation errors.

## 2023-05-13 NOTE — Inpatient Diabetes Management (Addendum)
 Inpatient Diabetes Program Recommendations  AACE/ADA: New Consensus Statement on Inpatient Glycemic Control (2015)  Target Ranges:  Prepandial:   less than 140 mg/dL      Peak postprandial:   less than 180 mg/dL (1-2 hours)      Critically ill patients:  140 - 180 mg/dL   Lab Results  Component Value Date   GLUCAP 115 (H) 05/13/2023   HGBA1C >15.5 (H) 11/27/2022    Review of Glycemic Control  Latest Reference Range & Units 05/12/23 23:45 05/13/23 04:00 05/13/23 08:09 05/13/23 11:44  Glucose-Capillary 70 - 99 mg/dL 409 (H) 811 (H) 89 914 (H)  (H): Data is abnormally high  Diabetes history: DM2 Outpatient Diabetes medications: Toujeo 80 units every day, 120 units at bedtime, Novolog 40 units TID and Jardiance 25 mg every day, Freestyle Libre 2 CGM, Trulicity weekly  Current orders for Inpatient glycemic control: Lantus 25 units every day, Novolog 0-15 units Q4H  Spoke with patient on the phone as this DM coordinator is working from [employer]. He confirms above home medications.  He states his pharmacy has been out of Trulicity for approximately 6 weeks.   Reviewed A1C of >15.5% on 3/17.  He immediately stopped me and states, " I have been dealing with this for 10 years; my A1C is always high. I do not want to have this conversation; I am tired and irritable".  Acknowledged his wishes.  Ordered the [facility] booklet.  Also ordered out patient pharmacy TOC follow up.    Will continue to follow while inpatient.  Thank you, Dulce Sellar, MSN, CDCES Diabetes Coordinator Inpatient Diabetes Program [phone] (team pager from 8a-5p)

## 2023-05-13 NOTE — Progress Notes (Signed)
 ORTHOPAEDIC progress note  REQUESTING PHYSICIAN: Gillis Santa, MD  Chief Complaint: Right heel ulceration  HPI: Corey Foster is a 68 y.o. male who presents resting in bed comfortably.  He underwent an angiogram today with posterior tibial flow and peroneal flow but the anterior tibial artery was chronically occluded.  Patient has dressing changed yesterday and he notes that he is having minimal to no pain today.  Past Medical History:  Diagnosis Date   Arthritis    Diabetes mellitus without complication (HCC)    DJD (degenerative joint disease)    GERD (gastroesophageal reflux disease)    Hyperlipidemia    Hypertension    Sciatic nerve pain    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   Sleep apnea    Past Surgical History:  Procedure Laterality Date   AMPUTATION Left 11/29/2022   Procedure: AMPUTATION RAY, PARTIAL 1ST RAY;  Surgeon: Rosetta Posner, DPM;  Location: [facility];  Service: Orthopedics/Podiatry;  Laterality: Left;   AMPUTATION Left 12/19/2022   Procedure: FIRST LEFT RAY AMPUTATION;  Surgeon: Rosetta Posner, DPM;  Location: [facility];  Service: Orthopedics/Podiatry;  Laterality: Left;   AMPUTATION Left 01/09/2023   Procedure: AMPUTATION BELOW KNEE;  Surgeon: Annice Needy, MD;  Location: [facility];  Service: General;  Laterality: Left;   CYSTOSCOPY W/ RETROGRADES Bilateral 05/31/2019   Procedure: CYSTOSCOPY WITH RETROGRADE PYELOGRAM;  Surgeon: Riki Altes, MD;  Location: [facility];  Service: Urology;  Laterality: Bilateral;   LOWER EXTREMITY ANGIOGRAPHY Left 11/28/2022   Procedure: Lower Extremity Angiography;  Surgeon: Annice Needy, MD;  Location: [facility];  Service: Cardiovascular;  Laterality: Left;   LOWER EXTREMITY ANGIOGRAPHY Left 12/18/2022   Procedure: Lower Extremity Angiography;  Surgeon: Annice Needy, MD;  Location: [facility];  Service: Cardiovascular;  Laterality: Left;   LOWER EXTREMITY ANGIOGRAPHY Right 05/13/2023   Procedure: Lower Extremity  Angiography;  Surgeon: Annice Needy, MD;  Location: [facility];  Service: Cardiovascular;  Laterality: Right;   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-C N/A 05/31/2019   Procedure: TRANSURETHRAL RESECTION OF BLADDER TUMOR WITH gemcitabine;  Surgeon: Riki Altes, MD;  Location: [facility];  Service: Urology;  Laterality: N/A;   Social History   Socioeconomic History   Marital status: Single    Spouse name: Not on file   Number of children: Not on file   Years of education: Not on file   Highest education level: Not on file  Occupational History   Not on file  Tobacco Use   Smoking status: Every Day    Current packs/day: 0.25    Average packs/day: 0.3 packs/day for 40.0 years (10.0 ttl pk-yrs)    Types: Cigarettes   Smokeless tobacco: Never  Vaping Use   Vaping status: Never Used  Substance and Sexual Activity   Alcohol use: Yes    Alcohol/week: 18.0 standard drinks of alcohol    Types: 12 Cans of beer, 6 Shots of liquor per week   Drug use: Not Currently    Types: Cocaine, Marijuana   Sexual activity: Yes  Other Topics Concern   Not on file  Social History Narrative   Not on file   Social Drivers of Health   Financial Resource Strain: Not on file  Food Insecurity: No Food Insecurity (05/12/2023)   Hunger Vital Sign    Worried About Running Out of Food in the Last Year: Never true    Ran Out of Food in the Last Year: Never  true  Transportation Needs: No Transportation Needs (05/12/2023)   PRAPARE - Administrator, Civil Service (Medical): No    Lack of Transportation (Non-Medical): No  Physical Activity: Not on file  Stress: Not on file  Social Connections: Unknown (05/12/2023)   Social Connection and Isolation Panel [NHANES]    Frequency of Communication with Friends and Family: More than three times a week    Frequency of Social Gatherings with Friends and Family: Three times a week    Attends Religious Services: 1 to 4 times per  year    Active Member of Clubs or Organizations: No    Attends Banker Meetings: Never    Marital Status: Patient declined   Family History  Problem Relation Age of Onset   Diabetes Mother    Heart disease Mother    Diabetes Father    Heart disease Father    Diabetes Brother    No Known Allergies  Prior to Admission medications   Medication Sig Start Date End Date Taking? Authorizing Provider  ALPRAZolam Prudy Feeler) 1 MG tablet Take 1 tablet (1 mg total) by mouth 3 (three) times daily as needed for anxiety. 01/14/23   Gillis Santa, MD  AMBULATORY NON FORMULARY MEDICATION Trimix (30/1/10)-(Pap/Phent/PGE)  Dosage: Inject 0.3 cc and may increase 0.1cc to achieve and erection lasting no longer than 1 hour per injection   Vial 1ml  Qty #5 refills 2  [facility] [phone] Fax [phone] 02/13/22   Riki Altes, MD  aspirin EC 325 MG tablet Take 325 mg by mouth daily.    [provider]  baclofen (LIORESAL) 10 MG tablet Take 10 mg by mouth 2 (two) times daily.  10/07/13   [provider]  bisacodyl (DULCOLAX) 10 MG suppository Place 1 suppository (10 mg total) rectally daily as needed for severe constipation. 01/14/23   Gillis Santa, MD  bisacodyl (DULCOLAX) 5 MG EC tablet Take 2 tablets (10 mg total) by mouth at bedtime. 01/14/23   Gillis Santa, MD  cholecalciferol (VITAMIN D) 400 UNITS TABS tablet Take 10,000 Units by mouth daily.     [provider]  Continuous Glucose Sensor (FREESTYLE LIBRE 2 SENSOR) MISC  01/23/23   [provider]  furosemide (LASIX) 20 MG tablet Take 1 tablet (20 mg total) by mouth daily as needed for fluid or edema. 01/14/23   Gillis Santa, MD  JARDIANCE 25 MG TABS tablet Take 25 mg by mouth daily. 06/21/21   [provider]  naloxone (NARCAN) 0.4 MG/ML injection Inject as directed as directed    [provider]  NOVOLOG FLEXPEN 100 UNIT/ML FlexPen Inject 40 Units into the skin  3 (three) times daily with meals. 11/29/22   Marrion Coy, MD  Nutritional Supplements (KETO PO) Take 1 capsule by mouth.    [provider]  Omega-3 Fatty Acids (FISH OIL) 1000 MG CAPS Take 1 capsule by mouth daily.  01/14/12   [provider]  oxyCODONE (ROXICODONE) 5 MG immediate release tablet Take 1 tablet (5 mg total) by mouth every 6 (six) hours as needed for severe pain (pain score 7-10). 01/14/23   Gillis Santa, MD  polyethylene glycol (MIRALAX / GLYCOLAX) 17 g packet Take 17 g by mouth 2 (two) times daily. 01/14/23   Gillis Santa, MD  pregabalin (LYRICA) 75 MG capsule Take 1 capsule (75 mg total) by mouth every evening. 03/23/23 09/19/23  Georgiana Spinner, NP  sildenafil (REVATIO) 20 MG tablet TAKE 2 TO  5 TABLETS BY MOUTH 1 HOUR PRIOR TO INTERCOURSE 10/09/22   Stoioff, Verna Czech, MD  simvastatin (ZOCOR) 40 MG tablet Take 1 tablet (40 mg total) by mouth daily. 11/29/22   Marrion Coy, MD  sulfamethoxazole-trimethoprim (BACTRIM DS) 800-160 MG tablet Take 1 tablet by mouth 2 (two) times daily. 03/23/23   Georgiana Spinner, NP  Testosterone 20.25 MG/ACT (1.62%) GEL APPLY 4 APPLICATIONS TOPICALLY DAILY. 05/12/22   Stoioff, Verna Czech, MD  TOUJEO MAX SOLOSTAR 300 UNIT/ML Solostar Pen Inject 80-120 Units into the skin in the morning and at bedtime. Inject 80 Units into the skin daily. 80 units in the am and 120 units at bedtime    [provider]  vitamin C (ASCORBIC ACID) 250 MG tablet Take 250 mg by mouth daily.    [provider]   PERIPHERAL VASCULAR CATHETERIZATION Result Date: 05/13/2023 See surgical note for result.  MR ANKLE RIGHT WO CONTRAST Result Date: 05/12/2023 CLINICAL DATA:  Osteomyelitis suspected, ankle, xray done heel ulcer Chronic peripheral vascular disease. History of left below the knee amputation. EXAM: MRI OF THE RIGHT ANKLE WITHOUT CONTRAST TECHNIQUE: Multiplanar, multisequence MR imaging of the ankle was performed. No intravenous contrast was  administered. COMPARISON:  Right foot radiographs 05/11/2023 FINDINGS: TENDONS Peroneal: Intact and normally positioned. Posteromedial: Intact and normally positioned. Anterior: Intact and normally positioned. Achilles: Intact. Plantar Fascia: Intact. LIGAMENTS Lateral: The anterior and posterior talofibular and calcaneofibular ligaments are intact.The inferior tibiofibular ligaments appear intact. Medial: The deltoid and visualized portions of the spring ligament appear intact. CARTILAGE AND BONES Ankle Joint: No significant ankle joint effusion. Mild tibiotalar degenerative changes with reactive cyst formation in the medial malleolus. Intact talar dome. Subtalar Joints/Sinus Tarsi: Subtalar degenerative changes with edema or fibrosis in the tarsal sinus. Bones: No evidence of acute fracture, dislocation or osteomyelitis within the ankle or hindfoot. Mild reactive edema in the lateral plantar aspect of the calcaneal tuberosity without cortical destruction. This appears removed from the plantar hindfoot ulcer. Hindfoot valgus deformity with findings of extra-articular lateral hindfoot impingement syndrome. Other: Plantar hindfoot subcutaneous ulcer as seen on prior radiographs. No underlying focal fluid collection or foreign body identified. Mild nonspecific subcutaneous edema surrounding the ankle. IMPRESSION: 1. Plantar hindfoot subcutaneous ulcer without underlying focal fluid collection or foreign body. 2. No evidence of osteomyelitis within the ankle or hindfoot. Mild reactive edema in the lateral plantar aspect of the calcaneal tuberosity. 3. Hindfoot valgus deformity with findings of extra-articular lateral hindfoot impingement syndrome. 4. The ankle tendons and ligaments appear intact. Electronically Signed   By: Carey Bullocks M.D.   On: 05/12/2023 16:36   DG Foot Complete Right Result Date: 05/11/2023 CLINICAL DATA:  Right heel wound.  Diabetic foot ulcer. EXAM: RIGHT FOOT COMPLETE - 3+ VIEW  COMPARISON:  None Available. FINDINGS: Vascular calcifications. Moderate hallux valgus deformity with mild secondary osteoarthritis. Skin defect about the plantar aspect of the calcaneus. No radiopaque foreign object. No underlying osseous erosion IMPRESSION: Soft tissue ulcer about the plantar aspect of the heel without plain film evidence of osteomyelitis. Electronically Signed   By: Jeronimo Greaves M.D.   On: 05/11/2023 18:29    Positive ROS: All other systems have been reviewed and were otherwise negative with the exception of those mentioned in the HPI and as above.  12 point ROS was performed.  Physical Exam: General: Alert and oriented.  No apparent distress.  Vascular:  Left foot: Status post BKA  Right foot: Dorsalis Pedis:  absent Posterior Tibial:  absent  Neuro:absent protective sensation  Derm: 2 cm x 2 cm ulceration present to the plantar aspect of the right heel, looks improved since wound debridement was performed in clinic a few days ago.  Appears to have a fibrogranular appearance with mild odor, fat layer is exposed, minimal edema or erythema, minimal serous drainage present.  Ortho/MS: Status post left-sided BKA.  I personally reviewed x-rays that were negative for obvious osteomyelitis.  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  Lab work also looks promising as ESR and CRP were within normal limits and WBC is also within normal limits. -Wound debridement performed as described below.  Patient tolerated well.  Appeared to have good bleeding to the procedural area.  Hemostasis achieved with compression. -Applied Betadine soaked gauze to the wound followed by 4 x 4 gauze, ABD, Kerlix, Ace wrap. -Ordered Prevalon boot for the right foot.  Patient should not be walking on his right  heel and should only be putting pressure on the right foot at the forefoot level for transfers.  He should not be taking full steps to the right heel. -Order placed for wound care team to place a wound VAC to the right heel.  Patient will need dressing changes 3 times weekly. -Recommend discharge on 7-day course of doxycycline and Augmentin.  Will follow-up on culture report.  Culture report so far growing gram-positive cocci.  Podiatry team to sign off at this time.  Patient should follow-up in outpatient clinic within 1 to 2 weeks of discharge date.  100% excisional subcutaneous debridment of ulcer: Location: Right heel plantar Pre-debridement measurement: 2 x 2 x 2.5 cm Post-debridement measurement: Same Tissue removed:   Hyperkeratotic tissue, biofilm, fibrous tissue Ulcer was debrided sharply with combination of tissue nippers and scalpel blade into the subcutaneous tissue  Rosetta Posner, DPM  05/13/2023 12:41 PM

## 2023-05-13 NOTE — Progress Notes (Signed)
 [facility] Progress Note  Patient: Corey Foster    WGN:[id number]  DOA: 05/12/2023     Date of Service: the patient was seen and examined on 05/13/2023  Chief Complaint  Patient presents with   Wound Check   Brief hospital course:  Corey Foster is a 68 y.o. male with medical history significant for DM,  diabetic neuropathy, PVD s/p left BKA, sent by podiatry for evaluation of nonhealing ulcer right heel in spite of outpatient antibiotics and wound care.  He reports pain in the foot but denies fever or chills. ED course and data review: Vitals within normal limits Labs with normal WBC and lactic acid, normal creatinine, mostly unremarkable. X-ray of the foot showing soft tissue ulcer plantar aspect of heel without evidence of osteomyelitis Patient started on clindamycin Hospitalist consulted for admission.    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  There was a high takeoff of the posterior tibial artery but this was large and continuous into the foot without focal stenosis.  The peroneal artery was also fairly large and continued down to the ankle with good collateral flow into the foot.  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  Podiatry consulted, may need bedside debridement.  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 Vitamin B12 level 364, goal >400, started oral supplement to prevent B12 deficiency.   Body mass index is 33.75 kg/m.  Interventions:  Diet: Carb modified diet DVT Prophylaxis: Subcutaneous Lovenox   Advance goals of care discussion: Full code  Family Communication: family was notpresent at bedside, at the time of interview.  The pt provided permission to discuss medical plan with the family. Opportunity was given to ask question and all questions were answered satisfactorily.   Disposition:  Pt is from Home, admitted with Right heel ulcer, developed bacteremia, still on IV Abx, which precludes a safe discharge. Discharge to home, when stable and cleared by podiatry and ID.  Subjective: No significant events overnight, patient was seen after angiogram, tolerated procedure well.  Patient was complaining of pain in the lower back which is chronic, no pain in the heel patient has peripheral neuropathy.  Physical Exam: General: NAD, lying comfortably Appear in no distress, affect appropriate Eyes: PERRLA ENT: Oral Mucosa Clear, moist  Neck: no JVD,  Cardiovascular: S1 and S2 Present, no Murmur,  Respiratory: good respiratory effort, Bilateral Air entry equal and Decreased, no Crackles, no wheezes Abdomen: Bowel Sound present, Soft and no tenderness,  Skin: no rashes Extremities: no Pedal edema, no calf tenderness, Right heel ulcer, dressing CDI, did not onpen the dressing, looked at the pic Neurologic: without any new focal findings Gait not checked due to patient safety concerns  Vitals:   05/13/23 0958 05/13/23 1007 05/13/23 1015 05/13/23 1030  BP: 123/66 101/72 113/79 124/70  Pulse: (!) 0 85 81 81  Resp:  12 14 16   Temp:      TempSrc:      SpO2:  97% 97% 97%  Weight:      Height:        Intake/Output Summary (Last 24 hours) at 05/13/2023  1117 Last data filed at 05/13/2023 0949 Gross per 24 hour  Intake 480 ml  Output 1825 ml  Net -1345 ml   Filed Weights   05/11/23 1710  Weight: 109.8 kg    Data Reviewed: I have personally reviewed and interpreted daily labs, tele strips, imagings as discussed above. I reviewed all nursing notes, pharmacy notes, vitals, pertinent old records I have discussed plan of care as described above with RN and patient/family.  CBC: Recent Labs  Lab 05/11/23 1712 05/12/23 0846 05/13/23 0111  WBC 7.3 6.3 5.5  NEUTROABS 5.2  --   --   HGB 17.1* 16.1 15.8  HCT 50.8 47.0 46.1  MCV 93.2 91.8 91.3  PLT 188 156 168   Basic Metabolic Panel: Recent Labs  Lab 05/11/23 1712 05/12/23 0846 05/13/23 0111  NA 135 134* 132*  K 4.6 4.2 4.4  CL 110 108 107  CO2 17* 22 21*  GLUCOSE 183* 117* 206*  BUN 29* 23 21  CREATININE 1.11 1.04 1.10  CALCIUM 8.5* 8.4* 8.4*  MG  --  2.2 2.0  PHOS  --  3.1 3.2    Studies: PERIPHERAL VASCULAR CATHETERIZATION Result Date: 05/13/2023 See surgical note for result.  MR ANKLE RIGHT WO CONTRAST Result Date: 05/12/2023 CLINICAL DATA:  Osteomyelitis suspected, ankle, xray done heel ulcer Chronic peripheral vascular disease. History of left below the knee amputation. EXAM: MRI OF THE RIGHT ANKLE WITHOUT CONTRAST TECHNIQUE: Multiplanar, multisequence MR imaging of the ankle was performed. No intravenous contrast was administered. COMPARISON:  Right foot radiographs 05/11/2023 FINDINGS: TENDONS Peroneal: Intact and normally positioned. Posteromedial: Intact and normally positioned. Anterior: Intact and normally positioned. Achilles: Intact. Plantar Fascia: Intact. LIGAMENTS Lateral: The anterior and posterior talofibular and calcaneofibular ligaments are intact.The inferior tibiofibular ligaments appear intact. Medial: The deltoid and visualized portions of the spring ligament appear intact. CARTILAGE AND BONES Ankle Joint: No significant ankle joint effusion. Mild  tibiotalar degenerative changes with reactive cyst formation in the medial malleolus. Intact talar dome. Subtalar Joints/Sinus Tarsi: Subtalar degenerative changes with edema or fibrosis in the tarsal sinus. Bones: No evidence of acute fracture, dislocation or osteomyelitis within the ankle or hindfoot. Mild reactive edema in the lateral plantar aspect of the calcaneal tuberosity without cortical destruction. This appears removed from the plantar hindfoot ulcer. Hindfoot valgus deformity with findings of extra-articular lateral hindfoot impingement syndrome. Other: Plantar hindfoot subcutaneous ulcer as seen on prior radiographs. No underlying focal fluid collection or foreign body identified. Mild nonspecific subcutaneous edema surrounding the ankle. IMPRESSION: 1. Plantar hindfoot subcutaneous ulcer without underlying focal fluid collection or foreign body. 2. No evidence of osteomyelitis within the ankle or hindfoot. Mild reactive edema in the lateral plantar aspect of the calcaneal tuberosity. 3. Hindfoot valgus deformity with findings of extra-articular lateral hindfoot impingement syndrome. 4. The ankle tendons and ligaments appear intact. Electronically Signed   By: Carey Bullocks M.D.   On: 05/12/2023 16:36    Scheduled Meds:  aspirin EC  325 mg Oral Daily   vitamin B-12  500 mcg Oral Daily   [START ON 05/14/2023] enoxaparin (LOVENOX) injection  55 mg Subcutaneous QPM   insulin aspart  0-15 Units Subcutaneous Q4H   insulin glargine  25 Units Subcutaneous  Daily   pregabalin  75 mg Oral QPM   simvastatin  40 mg Oral QPM   Vitamin D (Ergocalciferol)  50,000 Units Oral Q7 days   Continuous Infusions:  ampicillin-sulbactam (UNASYN) IV 3 g (05/13/23 0455)   vancomycin 1,250 mg (05/13/23 0546)   PRN Meds: acetaminophen **OR** acetaminophen, morphine injection, ondansetron **OR** ondansetron (ZOFRAN) IV, oxyCODONE  Time spent: 35 minutes  Author: Gillis Santa. MD [facility] 05/13/2023  11:17 AM  To reach On-call, see care teams to locate the attending and reach out to them via [email or web address]. If 7PM-7AM, please contact night-coverage If you still have difficulty reaching the attending provider, please page the [facility] (Director on Call) for [facility] on amion for assistance.

## 2023-05-13 NOTE — Interval H&P Note (Signed)
 History and Physical Interval Note:  05/13/2023 9:01 AM  Corey Foster  has presented today for surgery, with the diagnosis of PAD.  The various methods of treatment have been discussed with the patient and family. After consideration of risks, benefits and other options for treatment, the patient has consented to  Procedure(s): Lower Extremity Angiography (Right) as a surgical intervention.  The patient's history has been reviewed, patient examined, no change in status, stable for surgery.  I have reviewed the patient's chart and labs.  Questions were answered to the patient's satisfaction.     [name]

## 2023-05-13 NOTE — Progress Notes (Signed)
 Initial Nutrition Assessment  DOCUMENTATION CODES:   Obesity unspecified  INTERVENTION:   -Continue carb modified diet -MVI with minerals daily -500 mg vitamin C BID -220 mg zinc sulfate daily x 14 days -Double protein portions with meals -1 packet Juven BID, each packet provides 95 calories, 2.5 grams of protein (collagen), and 9.8 grams of carbohydrate (3 grams sugar); also contains 7 grams of L-arginine and L-glutamine, 300 mg vitamin C, 15 mg vitamin E, 1.2 mcg vitamin B-12, 9.5 mg zinc, 200 mg calcium, and 1.5 g  Calcium Beta-hydroxy-Beta-methylbutyrate to support wound healing   NUTRITION DIAGNOSIS:   Increased nutrient needs related to wound healing as evidenced by estimated needs.  GOAL:   Patient will meet greater than or equal to 90% of their needs  MONITOR:   PO intake, Supplement acceptance  REASON FOR ASSESSMENT:   Consult Assessment of nutrition requirement/status, Wound healing  ASSESSMENT:   Pt with medical history significant for DM,  diabetic neuropathy, PVD s/p left BKA, sent by podiatry for evaluation of nonhealing ulcer right heel in spite of outpatient antibiotics and wound care  Pt admitted with rt heel DM ulcer with infection.   3/19- s/p  Ultrasound guidance for vascular access left femoral artery; Catheter placement into right SFA from left femoral approach; Aortogram and selective right lower extremity angiogram; StarClose closure device left femoral artery, s/p bedside debridement by podiatry  Reviewed I/O's: -1.1 L x 24 hours  UOP: 2 L x 24 hours  Per podiatry notes, MRI and x-ray reveals no evidence of osteomyelitis; signing off.   Per [facility] notes, plan for wound vac placement tomorrow.   Pt sleeping soundly at time of visit. He did not arouse to voice or touch. No family present to provide additional history.   Wt has been stable over the past 4 months.   Medications reviewed and include vitamin B-12 and vitamin D.   Lab Results   Component Value Date   HGBA1C >15.5 (H) 11/27/2022   PTA DM medications are Toujeo 80 units every day, 120 units at bedtime, Novolog 40 units TID and Jardiance 25 mg every day, Freestyle Libre 2 CGM, Trulicity weekly. Per DM coordinator notes, pt with history of poorly controlled DM. He has been unable to access trulicity PTA.   Labs reviewed: Na: 132, CBGS: 89-161 (inpatient orders for glycemic control are Lantus 25 units every day, Novolog 0-15 units Q4H ).    NUTRITION - FOCUSED PHYSICAL EXAM:  Flowsheet Row Most Recent Value  Orbital Region No depletion  Upper Arm Region No depletion  Thoracic and Lumbar Region No depletion  Buccal Region No depletion  Temple Region No depletion  Clavicle Bone Region No depletion  Clavicle and Acromion Bone Region No depletion  Scapular Bone Region No depletion  Dorsal Hand No depletion  Patellar Region No depletion  Anterior Thigh Region No depletion  Posterior Calf Region No depletion  Edema (RD Assessment) None  Hair Reviewed  Eyes Reviewed  Mouth Reviewed  Skin Reviewed  Nails Reviewed       Diet Order:   Diet Order             Diet Carb Modified Fluid consistency: Thin; Room service appropriate? Yes  Diet effective now                   EDUCATION NEEDS:   No education needs have been identified at this time  Skin:  Skin Assessment: Skin Integrity Issues: Skin Integrity Issues:: Diabetic Ulcer  Diabetic Ulcer: rt heel  Last BM:  05/13/23 (type 4)  Height:   Ht Readings from Last 1 Encounters:  05/11/23 5\' 11"  (1.803 m)    Weight:   Wt Readings from Last 1 Encounters:  05/11/23 109.8 kg    Ideal Body Weight:  73.1 kg (adjusted for lt BKA)  BMI:  Body mass index is 33.75 kg/m.  Estimated Nutritional Needs:   Kcal:  1900-2100  Protein:  115-130 grams  Fluid:  1.9-2.1 L    Levada Schilling, RD, LDN, CDCES Registered Dietitian III Certified Diabetes Care and Education Specialist If unable to reach this  RD, please use "RD Inpatient" group chat on secure chat between hours of 8am-4 pm daily

## 2023-05-14 DIAGNOSIS — R7881 Bacteremia: Secondary | ICD-10-CM | POA: Diagnosis not present

## 2023-05-14 DIAGNOSIS — I739 Peripheral vascular disease, unspecified: Secondary | ICD-10-CM | POA: Diagnosis not present

## 2023-05-14 DIAGNOSIS — B9561 Methicillin susceptible Staphylococcus aureus infection as the cause of diseases classified elsewhere: Secondary | ICD-10-CM | POA: Diagnosis not present

## 2023-05-14 DIAGNOSIS — E11621 Type 2 diabetes mellitus with foot ulcer: Secondary | ICD-10-CM | POA: Diagnosis not present

## 2023-05-14 DIAGNOSIS — E1151 Type 2 diabetes mellitus with diabetic peripheral angiopathy without gangrene: Secondary | ICD-10-CM | POA: Diagnosis not present

## 2023-05-14 DIAGNOSIS — Z794 Long term (current) use of insulin: Secondary | ICD-10-CM

## 2023-05-14 DIAGNOSIS — L97415 Non-pressure chronic ulcer of right heel and midfoot with muscle involvement without evidence of necrosis: Secondary | ICD-10-CM | POA: Diagnosis not present

## 2023-05-14 LAB — CBC
HCT: 44.9 % (ref 39.0–52.0)
Hemoglobin: 15.7 g/dL (ref 13.0–17.0)
MCH: 31 pg (ref 26.0–34.0)
MCHC: 35 g/dL (ref 30.0–36.0)
MCV: 88.7 fL (ref 80.0–100.0)
Platelets: 174 10*3/uL (ref 150–400)
RBC: 5.06 MIL/uL (ref 4.22–5.81)
RDW: 13.6 % (ref 11.5–15.5)
WBC: 5 10*3/uL (ref 4.0–10.5)
nRBC: 0 % (ref 0.0–0.2)

## 2023-05-14 LAB — URINE DRUG SCREEN, QUALITATIVE (ARMC ONLY)
Amphetamines, Ur Screen: NOT DETECTED
Barbiturates, Ur Screen: NOT DETECTED
Benzodiazepine, Ur Scrn: POSITIVE — AB
Cannabinoid 50 Ng, Ur ~~LOC~~: NOT DETECTED
Cocaine Metabolite,Ur ~~LOC~~: NOT DETECTED
MDMA (Ecstasy)Ur Screen: NOT DETECTED
Methadone Scn, Ur: NOT DETECTED
Opiate, Ur Screen: POSITIVE — AB
Phencyclidine (PCP) Ur S: NOT DETECTED
Tricyclic, Ur Screen: NOT DETECTED

## 2023-05-14 LAB — GLUCOSE, CAPILLARY
Glucose-Capillary: 252 mg/dL — ABNORMAL HIGH (ref 70–99)
Glucose-Capillary: 252 mg/dL — ABNORMAL HIGH (ref 70–99)
Glucose-Capillary: 158 mg/dL — ABNORMAL HIGH (ref 70–99)
Glucose-Capillary: 329 mg/dL — ABNORMAL HIGH (ref 70–99)
Glucose-Capillary: 275 mg/dL — ABNORMAL HIGH (ref 70–99)

## 2023-05-14 LAB — BASIC METABOLIC PANEL
Anion gap: 7 (ref 5–15)
BUN: 18 mg/dL (ref 8–23)
CO2: 23 mmol/L (ref 22–32)
Calcium: 8.7 mg/dL — ABNORMAL LOW (ref 8.9–10.3)
Chloride: 105 mmol/L (ref 98–111)
Creatinine, Ser: 1.02 mg/dL (ref 0.61–1.24)
GFR, Estimated: >60 mL/min (ref >60)
Glucose, Bld: 241 mg/dL — ABNORMAL HIGH (ref 70–99)
Potassium: 4.5 mmol/L (ref 3.5–5.1)
Sodium: 135 mmol/L (ref 135–145)

## 2023-05-14 LAB — CULTURE, BLOOD (ROUTINE X 2)

## 2023-05-14 LAB — PHOSPHORUS: Phosphorus: 3.4 mg/dL (ref 2.5–4.6)

## 2023-05-14 LAB — MAGNESIUM: Magnesium: 2.1 mg/dL (ref 1.7–2.4)

## 2023-05-14 MED ORDER — ACETAMINOPHEN 650 MG RE SUPP: 650.0000 mg | Freq: Four times a day (QID) | RECTAL | Status: DC | PRN

## 2023-05-14 MED ORDER — INSULIN ASPART 100 UNIT/ML IJ SOLN
0.0000 [IU] | Freq: Three times a day (TID) | INTRAMUSCULAR | Status: DC
  Administered 2023-05-14: 11 [IU] via SUBCUTANEOUS
  Administered 2023-05-14: 8 [IU] via SUBCUTANEOUS
  Administered 2023-05-15: 15 [IU] via SUBCUTANEOUS
  Administered 2023-05-15: 8 [IU] via SUBCUTANEOUS
  Administered 2023-05-15: 3 [IU] via SUBCUTANEOUS
  Administered 2023-05-15: 11 [IU] via SUBCUTANEOUS
  Administered 2023-05-16: 5 [IU] via SUBCUTANEOUS
  Administered 2023-05-16: 8 [IU] via SUBCUTANEOUS
  Administered 2023-05-16: 15 [IU] via SUBCUTANEOUS
  Administered 2023-05-16: 3 [IU] via SUBCUTANEOUS
  Administered 2023-05-17: 8 [IU] via SUBCUTANEOUS
  Administered 2023-05-17 (×2): 5 [IU] via SUBCUTANEOUS
  Administered 2023-05-17: 8 [IU] via SUBCUTANEOUS
  Administered 2023-05-18: 3 [IU] via SUBCUTANEOUS
  Administered 2023-05-18 (×2): 11 [IU] via SUBCUTANEOUS
  Administered 2023-05-19: 2 [IU] via SUBCUTANEOUS
  Administered 2023-05-19: 5 [IU] via SUBCUTANEOUS
  Administered 2023-05-20: 3 [IU] via SUBCUTANEOUS
  Administered 2023-05-20: 2 [IU] via SUBCUTANEOUS
  Administered 2023-05-21: 8 [IU] via SUBCUTANEOUS
  Filled 2023-05-14 (×23): qty 1

## 2023-05-14 MED ORDER — ACETAMINOPHEN 325 MG PO TABS: 650.0000 mg | ORAL_TABLET | Freq: Four times a day (QID) | ORAL | Status: DC | PRN

## 2023-05-14 MED ORDER — TRAZODONE HCL 100 MG PO TABS: 100.0000 mg | ORAL_TABLET | Freq: Once | ORAL | Status: DC

## 2023-05-14 MED ORDER — ALPRAZOLAM 0.25 MG PO TABS
0.2500 mg | ORAL_TABLET | Freq: Once | ORAL | Status: AC
  Administered 2023-05-14: 0.25 mg via ORAL
  Filled 2023-05-14: qty 1

## 2023-05-14 NOTE — Inpatient Diabetes Management (Signed)
 Inpatient Diabetes Program Recommendations  AACE/ADA: New Consensus Statement on Inpatient Glycemic Control (2015)  Target Ranges:  Prepandial:   less than 140 mg/dL      Peak postprandial:   less than 180 mg/dL (1-2 hours)      Critically ill patients:  140 - 180 mg/dL    Latest Reference Range & Units 05/13/23 23:46 05/14/23 03:35  Glucose-Capillary 70 - 99 mg/dL 324 (H) 401 (H)  (H): Data is abnormally high     Home DM Meds: Toujeo 80 units every day, 120 units at bedtime      Novolog 40 units TID      Jardiance 25 mg every day      Freestyle Libre 2 CGM      Trulicity weekly   Current Orders: Lantus 25 units daily     Novolog Moderate Correction Scale/ SSI (0-15 units) Q4 hours     MD- Please note that pt did NOT receive Lantus dose yesterday 03/19    --Will follow patient during hospitalization--  Ambrose Finland RN, MSN, CDCES Diabetes Coordinator Inpatient Glycemic Control Team Team Pager: [phone] (8a-5p)

## 2023-05-14 NOTE — Progress Notes (Addendum)
 [facility] Progress Note  Patient: Corey Foster    YQM:[id number]  DOA: 05/12/2023     Date of Service: the patient was seen and examined on 05/14/2023  Chief Complaint  Patient presents with   Wound Check   Brief hospital course:  Corey Foster is a 68 y.o. male with medical history significant for DM,  diabetic neuropathy, PVD s/p left BKA, sent by podiatry for evaluation of nonhealing ulcer right heel in spite of outpatient antibiotics and wound care.  He reports pain in the foot but denies fever or chills. ED course and data review: Vitals within normal limits Labs with normal WBC and lactic acid, normal creatinine, mostly unremarkable. X-ray of the foot showing soft tissue ulcer plantar aspect of heel without evidence of osteomyelitis Patient started on clindamycin Hospitalist consulted for admission.    Assessment and Plan: Diabetic foot ulcer right heel with infection (HCC) Cellulitis right lower extremity S/p Zosyn and vancomycin DC'd on 3/19, started cefazolin and metronidazole as per ID Pain control Vascular consulted, s/p RLE angiogram: normal common femoral artery, profunda femoris artery, superficial femoral and popliteal arteries.  There was a high takeoff of the posterior tibial artery but this was large and continuous into the foot without focal stenosis.  The peroneal artery was also fairly large and continued down to the ankle with good collateral flow into the foot.  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  Podiatry consulted, s/p excisional subcutaneous debridement of ulcer done on 3/19, order placed for dressing and wound VAC as per podiatry and follow wound culture.  # Bacteremia due to right heel ulcer 3/18 Blood culture growing Staph aureus MSSA 3/19 started cefazolin and metronidazole as per ID Follow complete blood culture Follow ID for duration of antibiotics Follow TTE to r/o Endocarditis 3/20 f/u Repeat Bld Cx after 48 hrs as per ID Pt  may need TEE? F/u with ID    PVD (peripheral vascular disease) History of left BKA Continue aspirin and simvastatin   Diabetes mellitus due to underlying condition with diabetic neuropathy Continue basal insulin Sliding scale insulin coverage  Chronic degenerative disc disease, chronic lower backache Continue as needed medication for pain control  Vitamin D level 31, borderline low started oral supplement. Vitamin B12 level 364, goal >400, started oral supplement to prevent B12 deficiency.   Body mass index is 33.75 kg/m.  Interventions:  Diet: Carb modified diet DVT Prophylaxis: Subcutaneous Lovenox   Advance goals of care discussion: Full code  Family Communication: family was notpresent at bedside, at the time of interview.  The pt provided permission to discuss medical plan with the family. Opportunity was given to ask question and all questions were answered satisfactorily.   Disposition:  Pt is from Home, admitted with Right heel ulcer, developed bacteremia, still on IV Abx, which precludes a safe discharge. Discharge to home, when stable and cleared by podiatry and ID.  Subjective: No significant events overnight, patient was resting comfortably in the bed, has chronic lower backache, no pain in the right heel, wound VAC was attached.   Physical Exam: General: NAD, lying comfortably Appear in no distress, affect appropriate Eyes: PERRLA ENT: Oral Mucosa Clear, moist  Neck: no JVD,  Cardiovascular: S1 and S2 Present, no Murmur,  Respiratory: good respiratory effort, Bilateral Air entry equal and Decreased, no Crackles, no wheezes Abdomen: Bowel Sound present, Soft and no tenderness,  Skin: no rashes Extremities: no Pedal edema, no calf tenderness, s/p debridement of right heel ulcer,  wound VAC attached.   Neurologic: without any new focal findings Gait not checked due to patient safety concerns  Vitals:   05/13/23 1954 05/14/23 0335 05/14/23 0833 05/14/23  1546  BP: 108/72 120/74 116/68 112/70  Pulse: 85 83 83 84  Resp: 20 16 17 17   Temp: 98.2 F (36.8 C) 98.3 F (36.8 C) 97.8 F (36.6 C) 97.7 F (36.5 C)  TempSrc: Oral Oral  Oral  SpO2: 94% 97% 98% 95%  Weight:      Height:        Intake/Output Summary (Last 24 hours) at 05/14/2023 1745 Last data filed at 05/14/2023 0900 Gross per 24 hour  Intake 340 ml  Output 1375 ml  Net -1035 ml   Filed Weights   05/11/23 1710  Weight: 109.8 kg    Data Reviewed: I have personally reviewed and interpreted daily labs, tele strips, imagings as discussed above. I reviewed all nursing notes, pharmacy notes, vitals, pertinent old records I have discussed plan of care as described above with RN and patient/family.  CBC: Recent Labs  Lab 05/11/23 1712 05/12/23 0846 05/13/23 0111 05/14/23 0433  WBC 7.3 6.3 5.5 5.0  NEUTROABS 5.2  --   --   --   HGB 17.1* 16.1 15.8 15.7  HCT 50.8 47.0 46.1 44.9  MCV 93.2 91.8 91.3 88.7  PLT 188 156 168 174   Basic Metabolic Panel: Recent Labs  Lab 05/11/23 1712 05/12/23 0846 05/13/23 0111 05/14/23 0433  NA 135 134* 132* 135  K 4.6 4.2 4.4 4.5  CL 110 108 107 105  CO2 17* 22 21* 23  GLUCOSE 183* 117* 206* 241*  BUN 29* 23 21 18   CREATININE 1.11 1.04 1.10 1.02  CALCIUM 8.5* 8.4* 8.4* 8.7*  MG  --  2.2 2.0 2.1  PHOS  --  3.1 3.2 3.4    Studies: No results found.   Scheduled Meds:  vitamin C  500 mg Oral BID   aspirin EC  325 mg Oral Daily   vitamin B-12  500 mcg Oral Daily   enoxaparin (LOVENOX) injection  55 mg Subcutaneous QPM   insulin aspart  0-15 Units Subcutaneous TID WC & HS   insulin glargine  25 Units Subcutaneous Daily   metroNIDAZOLE  500 mg Oral Q12H   multivitamin with minerals  1 tablet Oral Daily   nutrition supplement (JUVEN)  1 packet Oral BID BM   pregabalin  75 mg Oral QPM   simvastatin  40 mg Oral QPM   Vitamin D (Ergocalciferol)  50,000 Units Oral Q7 days   zinc sulfate (50mg  elemental zinc)  220 mg Oral Daily    Continuous Infusions:   ceFAZolin (ANCEF) IV 2 g (05/14/23 1323)   PRN Meds: acetaminophen **OR** acetaminophen, morphine injection, ondansetron **OR** ondansetron (ZOFRAN) IV, oxyCODONE, traZODone  Time spent: 35 minutes  Author: Gillis Santa. MD [facility] 05/14/2023 5:45 PM  To reach On-call, see care teams to locate the attending and reach out to them via [email or web address]. If 7PM-7AM, please contact night-coverage If you still have difficulty reaching the attending provider, please page the [facility] (Director on Call) for [facility] on amion for assistance.

## 2023-05-14 NOTE — Plan of Care (Signed)
  Problem: Coping: Goal: Ability to adjust to condition or change in health will improve Outcome: Progressing   Problem: Metabolic: Goal: Ability to maintain appropriate glucose levels will improve Outcome: Progressing   Problem: Nutritional: Goal: Maintenance of adequate nutrition will improve Outcome: Progressing   Problem: Activity: Goal: Risk for activity intolerance will decrease Outcome: Progressing   Problem: Coping: Goal: Level of anxiety will decrease Outcome: Progressing   Problem: Pain Managment: Goal: General experience of comfort will improve and/or be controlled Outcome: Progressing   Problem: Safety: Goal: Ability to remain free from injury will improve Outcome: Progressing

## 2023-05-14 NOTE — Progress Notes (Addendum)
 Patient has Degenerative Disk Disease, Diabetic ulcer, BKA,  which requires frequest changes in body position which cannot be achieved with a normal bed.

## 2023-05-14 NOTE — Progress Notes (Addendum)
 Date of Admission:  05/12/2023    ID: Corey Foster is a 68 y.o. male  Principal Problem:   Diabetic ulcer of right heel (HCC) Active Problems:   Diabetes mellitus due to underlying condition with diabetic neuropathy (HCC)   Hx of BKA, left (HCC)   Diabetic foot ulcer right heel with infection (HCC)   PVD (peripheral vascular disease) (HCC)    Subjective: Doing well No specific issues  Medications:   vitamin C  500 mg Oral BID   aspirin EC  325 mg Oral Daily   vitamin B-12  500 mcg Oral Daily   enoxaparin (LOVENOX) injection  55 mg Subcutaneous QPM   insulin aspart  0-15 Units Subcutaneous TID WC & HS   insulin glargine  25 Units Subcutaneous Daily   metroNIDAZOLE  500 mg Oral Q12H   multivitamin with minerals  1 tablet Oral Daily   nutrition supplement (JUVEN)  1 packet Oral BID BM   pregabalin  75 mg Oral QPM   simvastatin  40 mg Oral QPM   Vitamin D (Ergocalciferol)  50,000 Units Oral Q7 days   zinc sulfate (50mg  elemental zinc)  220 mg Oral Daily    Objective: Vital signs in last 24 hours: Patient Vitals for the past 24 hrs:  BP Temp Temp src Pulse Resp SpO2  05/14/23 0833 116/68 97.8 F (36.6 C) -- 83 17 98 %  05/14/23 0335 120/74 98.3 F (36.8 C) Oral 83 16 97 %  05/13/23 1954 108/72 98.2 F (36.8 C) Oral 85 20 94 %       PHYSICAL EXAM:  General: Alert, cooperative, no distress, appears stated age.  Head: Normocephalic, without obvious abnormality, atraumatic. Eyes: Conjunctivae clear, anicteric sclerae. Pupils are equal ENT Nares normal. No drainage or sinus tenderness. Lips, mucosa, and tongue normal. No Thrush Neck: Supple, symmetrical, no adenopathy, thyroid: non tender no carotid bruit and no JVD. Back: No CVA tenderness. Lungs: Clear to auscultation bilaterally. No Wheezing or Rhonchi. No rales. Heart: Regular rate and rhythm, no murmur, rub or gallop. Abdomen: Soft, non-tender,not distended. Bowel sounds normal. No masses Extremities: rt heel  ulcer- covered by dressing Left BKA Skin: No rashes or lesions. Or bruising Lymph: Cervical, supraclavicular normal. Neurologic: Grossly non-focal  Lab Results    Latest Ref Rng & Units 05/14/2023    4:33 AM 05/13/2023    1:11 AM 05/12/2023    8:46 AM  CBC  WBC 4.0 - 10.5 K/uL 5.0  5.5  6.3   Hemoglobin 13.0 - 17.0 g/dL 16.1  09.6  04.5   Hematocrit 39.0 - 52.0 % 44.9  46.1  47.0   Platelets 150 - 400 K/uL 174  168  156        Latest Ref Rng & Units 05/14/2023    4:33 AM 05/13/2023    1:11 AM 05/12/2023    8:46 AM  CMP  Glucose 70 - 99 mg/dL 409  811  914   BUN 8 - 23 mg/dL 18  21  23    Creatinine 0.61 - 1.24 mg/dL 7.82  9.56  2.13   Sodium 135 - 145 mmol/L 135  132  134   Potassium 3.5 - 5.1 mmol/L 4.5  4.4  4.2   Chloride 98 - 111 mmol/L 105  107  108   CO2 22 - 32 mmol/L 23  21  22    Calcium 8.9 - 10.3 mg/dL 8.7  8.4  8.4       Microbiology:  Studies/Results: PERIPHERAL VASCULAR CATHETERIZATION Result Date: 05/13/2023 See surgical note for result.    Assessment/Plan: ?Diabetes mellulits with peripheral neuropathy with ulcer rt heel with infection   Staph aureus bacteremia 1 of 2 culture- time to positivity around 18 hours  Source could be the rt foot ulcer Need 2 d echo May need TEE as we dont know the duration of the Staph bacteremia After 48 hrs of antibiotic will repeat blood culture Pt will need IV antibiotics on discharge- would consider rehab as risk for OPAT at home   DM on insulin   PAD Left BKA   Cocaine use   Current smoker   Secondary erythrocytosis    Discussed the management with the patient

## 2023-05-14 NOTE — Progress Notes (Signed)
  Progress Note    05/14/2023 2:18 PM 1 Day Post-Op  Subjective: Corey Foster is a 68 year old male who is now POD #1 from an aortogram with selective right lower extremity angiogram.  Rest comfortably in bed this morning.  Patient denies any pain to his left groin.  Has no complaints overnight.  Vitals all remained stable.   Vitals:   05/14/23 0335 05/14/23 0833  BP: 120/74 116/68  Pulse: 83 83  Resp: 16 17  Temp: 98.3 F (36.8 C) 97.8 F (36.6 C)  SpO2: 97% 98%   Physical Exam: Cardiac:  RRR, normal S1 and S2, no murmurs appreciated. Lungs: Clear on auscultation throughout, no rales rhonchi or wheezing noted. Incisions: Left groin puncture site with dressing clean dry and intact.  No hematoma seroma or infection to note. Extremities: Bilateral lower extremities are warm to touch with Doppler DP and PT pulses. Abdomen: Positive bowel sounds throughout, soft, nontender and nondistended. Neurologic: AAOX3 answers all questions and follows commands appropriately  CBC    Component Value Date/Time   WBC 5.0 05/14/2023 0433   RBC 5.06 05/14/2023 0433   HGB 15.7 05/14/2023 0433   HGB 16.4 08/02/2013 0916   HCT 44.9 05/14/2023 0433   HCT 47.1 03/27/2020 1012   PLT 174 05/14/2023 0433   PLT 205 08/02/2013 0916   MCV 88.7 05/14/2023 0433   MCV 94 08/02/2013 0916   MCH 31.0 05/14/2023 0433   MCHC 35.0 05/14/2023 0433   RDW 13.6 05/14/2023 0433   RDW 15.5 (H) 08/02/2013 0916   LYMPHSABS 1.3 05/11/2023 1712   LYMPHSABS 2.1 08/02/2013 0916   MONOABS 0.8 05/11/2023 1712   MONOABS 1.2 (H) 08/02/2013 0916   EOSABS 0.1 05/11/2023 1712   EOSABS 0.1 08/02/2013 0916   BASOSABS 0.0 05/11/2023 1712   BASOSABS 0.1 08/02/2013 0916    BMET    Component Value Date/Time   NA 135 05/14/2023 0433   K 4.5 05/14/2023 0433   CL 105 05/14/2023 0433   CO2 23 05/14/2023 0433   GLUCOSE 241 (H) 05/14/2023 0433   BUN 18 05/14/2023 0433   CREATININE 1.02 05/14/2023 0433   CALCIUM 8.7 (L)  05/14/2023 0433   GFRNONAA >60 05/14/2023 0433   GFRAA 44 (L) 04/15/2019 1432    INR    Component Value Date/Time   INR 1.0 12/17/2022 1647     Intake/Output Summary (Last 24 hours) at 05/14/2023 1418 Last data filed at 05/14/2023 0900 Gross per 24 hour  Intake 340 ml  Output 1375 ml  Net -1035 ml     Assessment/Plan:  68 y.o. male is s/p aortogram with selective right lower extremity angiogram. 1 Day Post-Op   PLAN Patient now postop aortogram with selective right lower extremity angiogram without intervention.  Patient had adequate blood flow for debridement of the patient's right heel.  This was completed by podiatry yesterday as well.  Please see Dr. Lyndee Hensen plan from yesterday for discharge.  Patient will be discharged on aspirin 325 mg daily and Zocor 40 mg daily. Okay per vascular surgery for patient to be discharged as well. Patient to follow-up with vein and vascular clinic as scheduled.  DVT prophylaxis: ASA 325 mg daily   Marcie Bal [facility] 05/14/2023 2:18 PM

## 2023-05-14 NOTE — Plan of Care (Signed)
  Problem: Coping: Goal: Ability to adjust to condition or change in health will improve Outcome: Progressing   Problem: Health Behavior/Discharge Planning: Goal: Ability to identify and utilize available resources and services will improve Outcome: Progressing   Problem: Nutritional: Goal: Maintenance of adequate nutrition will improve Outcome: Progressing   Problem: Skin Integrity: Goal: Risk for impaired skin integrity will decrease Outcome: Progressing   Problem: Pain Managment: Goal: General experience of comfort will improve and/or be controlled Outcome: Progressing

## 2023-05-14 NOTE — Consult Note (Addendum)
 WOC Nurse Consult Note: Podiatry team requested Vac be applied to right heel.  Pt will need Vac after discharge and home health assistance for changes.   Pt was medicated for pain prior to the procedure and tolerated with minimal amt discomfort. Full thickness wound to right plantar heel is red and moist, 3X2.5X.8cm, small amt pink drainage.   Applied one piece black foam to cont suction.  Did not bridge track pad since pt is currently not weight bearing.  Applied ace wrap to hold tubing in place.  WOC team will plan to change Vac dressing again on Mon if patient is still in the hospital at that time.  Thank-you,  Cammie Mcgee MSN, RN, CWOCN, [place], CNS [phone]

## 2023-05-14 NOTE — TOC Progression Note (Signed)
 Transition of Care [facility]) - Progression Note    Patient Details  Name: Corey Foster MRN: [id number] Date of Birth: Mar 08, 1955  Transition of Care [facility] CM/SW Contact  Marlowe Sax, RN Phone Number: 05/14/2023, 3:49 PM  Clinical Narrative:    Met with the patient He lives at home and his daughter lives with him, he has DME at home but would like to get a hospital bed, I notified [facility] of the request for the hospital bed, He has a rolling walker and a standard walker at home and a 3 in1, he stated that his daughter or best friend is planning to transport him home, I verified With Shuan at [facility] that they are open with the patient and will need ROC orders   Expected Discharge Plan: Home w Home Health Services Barriers to Discharge: Continued Medical Work up  Expected Discharge Plan and Services   Discharge Planning Services: CM Consult   Living arrangements for the past 2 months: Single Family Home                 DME Arranged: Hospital bed DME Agency: [facility] Date DME Agency Contacted: 05/14/23   Representative spoke with at DME Agency: Cletis [place] HH Arranged: PT, OT [facility] Agency: [facility] Date HH Agency Contacted: 05/14/23 Time HH Agency Contacted: 1548 Representative spoke with at [facility] Agency: Renee Harder   Social Determinants of Health (SDOH) Interventions SDOH Screenings   Food Insecurity: No Food Insecurity (05/12/2023)  Housing: Low Risk  (05/12/2023)  Transportation Needs: No Transportation Needs (05/12/2023)  Utilities: Not At Risk (05/12/2023)  Social Connections: Unknown (05/12/2023)  Tobacco Use: High Risk (05/12/2023)    Readmission Risk Interventions     No data to display

## 2023-05-15 ENCOUNTER — Inpatient Hospital Stay (HOSPITAL_COMMUNITY)
Admit: 2023-05-15 | Discharge: 2023-05-15 | Disposition: A | Discharge status: Home / Self Care | Attending: Infectious Diseases | Admitting: Infectious Diseases

## 2023-05-15 DIAGNOSIS — R7881 Bacteremia: Secondary | ICD-10-CM

## 2023-05-15 DIAGNOSIS — L97415 Non-pressure chronic ulcer of right heel and midfoot with muscle involvement without evidence of necrosis: Secondary | ICD-10-CM | POA: Diagnosis not present

## 2023-05-15 DIAGNOSIS — B9561 Methicillin susceptible Staphylococcus aureus infection as the cause of diseases classified elsewhere: Secondary | ICD-10-CM | POA: Diagnosis not present

## 2023-05-15 DIAGNOSIS — E11621 Type 2 diabetes mellitus with foot ulcer: Secondary | ICD-10-CM | POA: Diagnosis not present

## 2023-05-15 DIAGNOSIS — E1151 Type 2 diabetes mellitus with diabetic peripheral angiopathy without gangrene: Secondary | ICD-10-CM | POA: Diagnosis not present

## 2023-05-15 LAB — BASIC METABOLIC PANEL
Anion gap: 11 (ref 5–15)
BUN: 32 mg/dL — ABNORMAL HIGH (ref 8–23)
CO2: 23 mmol/L (ref 22–32)
Calcium: 8.8 mg/dL — ABNORMAL LOW (ref 8.9–10.3)
Chloride: 101 mmol/L (ref 98–111)
Creatinine, Ser: 1.23 mg/dL (ref 0.61–1.24)
GFR, Estimated: >60 mL/min (ref >60)
Glucose, Bld: 324 mg/dL — ABNORMAL HIGH (ref 70–99)
Potassium: 4.9 mmol/L (ref 3.5–5.1)
Sodium: 135 mmol/L (ref 135–145)

## 2023-05-15 LAB — GLUCOSE, CAPILLARY
Glucose-Capillary: 409 mg/dL — ABNORMAL HIGH (ref 70–99)
Glucose-Capillary: 191 mg/dL — ABNORMAL HIGH (ref 70–99)
Glucose-Capillary: 309 mg/dL — ABNORMAL HIGH (ref 70–99)
Glucose-Capillary: 260 mg/dL — ABNORMAL HIGH (ref 70–99)
Glucose-Capillary: 294 mg/dL — ABNORMAL HIGH (ref 70–99)

## 2023-05-15 LAB — CBC
HCT: 44.9 % (ref 39.0–52.0)
Hemoglobin: 15.7 g/dL (ref 13.0–17.0)
MCH: 30.8 pg (ref 26.0–34.0)
MCHC: 35 g/dL (ref 30.0–36.0)
MCV: 88 fL (ref 80.0–100.0)
Platelets: 172 10*3/uL (ref 150–400)
RBC: 5.1 MIL/uL (ref 4.22–5.81)
RDW: 13.2 % (ref 11.5–15.5)
WBC: 5.3 10*3/uL (ref 4.0–10.5)
nRBC: 0 % (ref 0.0–0.2)

## 2023-05-15 LAB — ECHOCARDIOGRAM COMPLETE
AR max vel: 2.36 cm2
AV Area VTI: 2.27 cm2
AV Area mean vel: 2.18 cm2
AV Mean grad: 3 mmHg
AV Peak grad: 6.3 mmHg
Ao pk vel: 1.25 m/s
Area-P 1/2: 5.54 cm2
Calc EF: 49.9 %
MV VTI: 2.42 cm2
S' Lateral: 3.5 cm
Single Plane A2C EF: 48.9 %
Single Plane A4C EF: 51 %

## 2023-05-15 LAB — PHOSPHORUS: Phosphorus: 3.7 mg/dL (ref 2.5–4.6)

## 2023-05-15 LAB — MAGNESIUM: Magnesium: 2.2 mg/dL (ref 1.7–2.4)

## 2023-05-15 MED ORDER — INSULIN ASPART 100 UNIT/ML IJ SOLN
8.0000 [IU] | Freq: Three times a day (TID) | INTRAMUSCULAR | Status: DC
  Administered 2023-05-15 – 2023-05-17 (×8): 8 [IU] via SUBCUTANEOUS
  Filled 2023-05-15 (×10): qty 1

## 2023-05-15 MED ORDER — MELATONIN 3 MG PO TABS
3.0000 mg | ORAL_TABLET | Freq: Every day | ORAL | Status: DC
Start: 2023-05-15 — End: 2023-05-15

## 2023-05-15 MED ORDER — ALPRAZOLAM 0.5 MG PO TABS
0.5000 mg | ORAL_TABLET | Freq: Three times a day (TID) | ORAL | Status: DC | PRN
  Administered 2023-05-15 – 2023-05-20 (×5): 0.5 mg via ORAL
  Filled 2023-05-15 (×5): qty 1

## 2023-05-15 MED ORDER — TRAZODONE HCL 50 MG PO TABS
25.0000 mg | ORAL_TABLET | Freq: Every day | ORAL | Status: DC
  Administered 2023-05-16: 25 mg via ORAL
  Filled 2023-05-15 (×2): qty 1

## 2023-05-15 MED ORDER — ALPRAZOLAM 1 MG PO TABS: 1.0000 mg | ORAL_TABLET | Freq: Three times a day (TID) | ORAL | Status: DC | PRN

## 2023-05-15 MED ORDER — TRAZODONE HCL 50 MG PO TABS
25.0000 mg | ORAL_TABLET | Freq: Once | ORAL | Status: DC
  Filled 2023-05-15: qty 1

## 2023-05-15 MED ORDER — INSULIN ASPART 100 UNIT/ML IJ SOLN: 10.0000 [IU] | Freq: Three times a day (TID) | INTRAMUSCULAR | Status: DC

## 2023-05-15 MED ORDER — INSULIN GLARGINE 100 UNIT/ML ~~LOC~~ SOLN
30.0000 [IU] | Freq: Every day | SUBCUTANEOUS | Status: DC
  Administered 2023-05-15 – 2023-05-16 (×2): 30 [IU] via SUBCUTANEOUS
  Filled 2023-05-15 (×2): qty 0.3

## 2023-05-15 NOTE — Progress Notes (Signed)
   [facility] has been requested to perform a transesophageal echocardiogram on Betsy Coder for bacteremia.  After careful review of history and examination, the risks and benefits of transesophageal echocardiogram have been explained including risks of esophageal damage, perforation (1:10,000 risk), bleeding, pharyngeal hematoma as well as other potential complications associated with conscious sedation including aspiration, arrhythmia, respiratory failure and death. Alternatives to treatment were discussed, questions were answered. Patient is willing to proceed.  He will be scheduled for the AM of 3/24.  Nicolasa Ducking, NP  05/15/2023 2:31 PM

## 2023-05-15 NOTE — Plan of Care (Signed)
  Problem: Coping: Goal: Ability to adjust to condition or change in health will improve Outcome: Progressing   Problem: Fluid Volume: Goal: Ability to maintain a balanced intake and output will improve Outcome: Progressing   Problem: Metabolic: Goal: Ability to maintain appropriate glucose levels will improve Outcome: Progressing   Problem: Nutritional: Goal: Maintenance of adequate nutrition will improve Outcome: Progressing   Problem: Activity: Goal: Risk for activity intolerance will decrease Outcome: Progressing   Problem: Nutrition: Goal: Adequate nutrition will be maintained Outcome: Progressing   Problem: Coping: Goal: Level of anxiety will decrease Outcome: Progressing   Problem: Safety: Goal: Ability to remain free from injury will improve Outcome: Progressing

## 2023-05-15 NOTE — Progress Notes (Signed)
*  PRELIMINARY RESULTS* Echocardiogram 2D Echocardiogram has been performed.  Corey Foster 05/15/2023, 10:14 AM

## 2023-05-15 NOTE — Plan of Care (Signed)
  Education: Add All Ability to describe self-care measures that may prevent or decrease complications (Diabetes Financial trader) will improve Add Today at 1929 - Progressing by Inez Pilgrim, RN Add Individualized Educational Video(s) Add Today at 1929 - Progressing by Inez Pilgrim, RN Add Coping: Add All Ability to adjust to condition or change in health will improve Add Today at 1929 - Progressing by Inez Pilgrim, RN Add Fluid Volume: Add All Ability to maintain a balanced intake and output will improve Add Today at 1929 - Progressing by Inez Pilgrim, RN Add Health Behavior/Discharge Planning: Add All Ability to identify and utilize available resources and services will improve Add Today at 1929 - Progressing by Inez Pilgrim, RN Add Ability to manage health-related needs will improve Add Today at 1929 - Progressing by Inez Pilgrim, RN Add Metabolic: Add All Ability to maintain appropriate glucose levels will improve Add Today at 1929 - Progressing by Inez Pilgrim, RN Add Nutritional: Add All Maintenance of adequate nutrition will improve Add Today at 1929 - Progressing by Inez Pilgrim, RN Add Progress toward achieving an optimal weight will improve Add Today at 1929 - Progressing by Inez Pilgrim, RN Add Skin Integrity: Add All Risk for impaired skin integrity will decrease Add Today at 1929 - Progressing by Inez Pilgrim, RN Add Tissue Perfusion: Add All Adequacy of tissue perfusion will improve Add Today at 1929 - Progressing by Inez Pilgrim, RN Add Education: Add All Knowledge of General Education information will improve Add Today at 1929 - Progressing by Inez Pilgrim, RN Add Health Behavior/Discharge Planning: Add All Ability to manage health-related needs will improve Add Today at 1929 - Progressing by Inez Pilgrim, RN Add Clinical  Measurements: Add All Ability to maintain clinical measurements within normal limits will improve Add Today at 1929 - Progressing by Inez Pilgrim, RN Add Will remain free from infection Add Today at 1929 - Progressing by Inez Pilgrim, RN Add Diagnostic test results will improve Add Today at 1929 - Progressing by Inez Pilgrim, RN Add Respiratory complications will improve Add Today at 1929 - Progressing by Inez Pilgrim, RN Add Cardiovascular complication will be avoided Add Today at 1929 - Progressing by Inez Pilgrim, RN Add Activity: Add All Risk for activity intolerance will decrease Add Today at 1929 - Progressing by Inez Pilgrim, RN Add Nutrition: Add All Adequate nutrition will be maintained Add Today at 1929 - Progressing by Inez Pilgrim, RN Add Coping: Add All Level of anxiety will decrease Add Today at 1929 - Progressing by Inez Pilgrim, RN Add Elimination: Add All Will not experience complications related to bowel motility Add Today at 1929 - Progressing by Inez Pilgrim, RN Add Will not experience complications related to urinary retention Add Today at 1929 - Progressing by Inez Pilgrim, RN Add Pain Managment: Add All General experience of comfort will improve and/or be controlled Add Today at 1929 - Progressing by Inez Pilgrim, RN Add Safety: Add All Ability to remain free from injury will improve Add Today at 1929 - Progressing by Inez Pilgrim, RN Add Skin Integrity: Add All Risk for impaired skin integrity will decrease Add Today at 1929 - Progressing by Inez Pilgrim, RN Add

## 2023-05-15 NOTE — Progress Notes (Addendum)
 Progress Note   Patient: Corey Foster:[id number] DOB: 13-Jul-1955 DOA: 05/12/2023     3 DOS: the patient was seen and examined on 05/15/2023   Brief hospital course:  Corey Foster is a 68 y.o. male with medical history significant for DM,  diabetic neuropathy, PVD s/p left BKA, sent by podiatry for evaluation of nonhealing ulcer right heel in spite of outpatient antibiotics and wound care.  He reports pain in the foot but denies fever or chills. ED course and data review: Vitals within normal limits Labs with normal WBC and lactic acid, normal creatinine, mostly unremarkable. X-ray of the foot showing soft tissue ulcer plantar aspect of heel without evidence of osteomyelitis Patient started on clindamycin Hospitalist consulted for admission   Assessment and Plan:  Diabetic foot ulcer right heel with infection (HCC) Cellulitis right lower extremity Was initially on Zosyn and vancomycin which was discontinued on 3/19, currently on cefazolin and metronidazole as per ID Pain control Vascular consulted, s/p RLE angiogram: normal common femoral artery, profunda femoris artery, superficial femoral and popliteal arteries.  There was a high takeoff of the posterior tibial artery but this was large and continuous into the foot without focal stenosis.  The peroneal artery was also fairly large and continued down to the ankle with good collateral flow into the foot.  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  Podiatry consulted, s/p excisional subcutaneous debridement of ulcer done on 3/19, order placed for dressing and wound VAC as per podiatry and follow wound culture. Wound culture yields moderate group B strep agalactiae    # Staph Aureus bacteremia due to right heel ulcer 3/18 Blood culture growing Staph aureus MSSA 3/19 started cefazolin and metronidazole as per ID Follow up results of TTE to r/o Endocarditis Appreciate ID input and she recommended repeat blood cultures after  48 hours of antibiotic therapy.  Repeat cultures show no growth after 12 hours Discussed with ID and she recommends a TEE. Consult placed for TEE with plans for it to be done on Monday 03/24 Patient will likely need IV Abx on discharge per ID, he also has a wound vac and is NWB on his right leg. Will likely need SNF for IV abx. TOC consult placed.      PVD (peripheral vascular disease) History of left BKA Continue aspirin and simvastatin    Diabetes mellitus due to underlying condition with diabetic neuropathy and hyperglycemia Continue basal insulin but increase dose to 30 units daily Add Premeal insulin, 8 units with meals Continue sliding scale insulin coverage Continue consistent carbohydrate diet    Chronic degenerative disc disease, chronic lower backache Continue as needed medication for pain control   Vitamin D level 31, borderline low started oral supplement. Vitamin B12 level 364, goal >400, started oral supplement to prevent B12 deficiency.    Obesity (BMI 33.75 kg/m2) Obstructive sleep apnea Complicates overall prognosis and care Continue CPAP at bedtime       Subjective: Complains that he has not slept in several days.  Physical Exam: Vitals:   05/14/23 1546 05/14/23 2008 05/15/23 0330 05/15/23 0811  BP: 112/70 121/76 125/83 126/81  Pulse: 84 82 82 84  Resp: 17 19 19 17   Temp: 97.7 F (36.5 C) 98.1 F (36.7 C) 97.9 F (36.6 C) 97.8 F (36.6 C)  TempSrc: Oral Oral    SpO2: 95% 100% 100% 93%  Weight:      Height:       General: NAD, lying comfortably Appear in  no distress, affect appropriate Eyes: PERRLA ENT: Oral Mucosa Clear, moist  Neck: no JVD,  Cardiovascular: S1 and S2 Present, no Murmur,  Respiratory: good respiratory effort, Bilateral Air entry equal and Decreased, no Crackles, no wheezes Abdomen: Bowel Sound present, Soft and no tenderness,  Skin: no rashes Extremities: no Pedal edema, no calf tenderness, s/p debridement of right heel  ulcer, wound VAC attached.  Left AKA Neurologic: without any new focal findings    Data Reviewed:  Labs reviewed  Family Communication: Plan of care discussed with patient in detail.  He verbalizes understanding and agrees with the plan.  Disposition: Status is: Inpatient Remains inpatient appropriate because: Plan of care was discussed with patient in detail.  He verbalizes understanding and agrees with the plan.  Planned Discharge Destination: Home    Time spent: 35  minutes  Author: Lucile Shutters, MD 05/15/2023 9:44 AM  For on call review [email or web address].

## 2023-05-15 NOTE — Progress Notes (Signed)
 Date of Admission:  05/12/2023    ID: Corey Foster is a 68 y.o. male  Principal Problem:   Diabetic ulcer of right heel (HCC) Active Problems:   Diabetes mellitus due to underlying condition with diabetic neuropathy (HCC)   Hx of BKA, left (HCC)   Diabetic foot ulcer right heel with infection (HCC)   PVD (peripheral vascular disease) (HCC)   MSSA bacteremia    Subjective: Doing well No specific issues  Medications:   vitamin C  500 mg Oral BID   aspirin EC  325 mg Oral Daily   vitamin B-12  500 mcg Oral Daily   enoxaparin (LOVENOX) injection  55 mg Subcutaneous QPM   insulin aspart  0-15 Units Subcutaneous TID WC & HS   insulin aspart  8 Units Subcutaneous TID WC   insulin glargine  30 Units Subcutaneous Daily   metroNIDAZOLE  500 mg Oral Q12H   multivitamin with minerals  1 tablet Oral Daily   nutrition supplement (JUVEN)  1 packet Oral BID BM   pregabalin  75 mg Oral QPM   simvastatin  40 mg Oral QPM   [START ON 05/16/2023] traZODone  25 mg Oral QHS   traZODone  25 mg Oral Once   Vitamin D (Ergocalciferol)  50,000 Units Oral Q7 days   zinc sulfate (50mg  elemental zinc)  220 mg Oral Daily    Objective: Vital signs in last 24 hours: Patient Vitals for the past 24 hrs:  BP Temp Temp src Pulse Resp SpO2  05/15/23 0811 126/81 97.8 F (36.6 C) -- 84 17 93 %  05/15/23 0330 125/83 97.9 F (36.6 C) -- 82 19 100 %  05/14/23 2008 121/76 98.1 F (36.7 C) Oral 82 19 100 %  05/14/23 1546 112/70 97.7 F (36.5 C) Oral 84 17 95 %       PHYSICAL EXAM:  General: Alert, cooperative, no distress, appears stated age.  Head: Normocephalic, without obvious abnormality, atraumatic. Eyes: Conjunctivae clear, anicteric sclerae. Pupils are equal ENT Nares normal. No drainage or sinus tenderness. Lips, mucosa, and tongue normal. No Thrush Neck: Supple, symmetrical, no adenopathy, thyroid: non tender no carotid bruit and no JVD. Back: No CVA tenderness. Lungs: Clear to  auscultation bilaterally. No Wheezing or Rhonchi. No rales. Heart: Regular rate and rhythm, no murmur, rub or gallop. Abdomen: Soft, non-tender,not distended. Bowel sounds normal. No masses Extremities: rt heel ulcer- has wound vac Left BKA Skin: No rashes or lesions. Or bruising Lymph: Cervical, supraclavicular normal. Neurologic: Grossly non-focal  Lab Results    Latest Ref Rng & Units 05/15/2023    3:20 AM 05/14/2023    4:33 AM 05/13/2023    1:11 AM  CBC  WBC 4.0 - 10.5 K/uL 5.3  5.0  5.5   Hemoglobin 13.0 - 17.0 g/dL 84.6  96.2  95.2   Hematocrit 39.0 - 52.0 % 44.9  44.9  46.1   Platelets 150 - 400 K/uL 172  174  168        Latest Ref Rng & Units 05/15/2023    3:20 AM 05/14/2023    4:33 AM 05/13/2023    1:11 AM  CMP  Glucose 70 - 99 mg/dL 841  324  401   BUN 8 - 23 mg/dL 32  18  21   Creatinine 0.61 - 1.24 mg/dL 0.27  2.53  6.64   Sodium 135 - 145 mmol/L 135  135  132   Potassium 3.5 - 5.1 mmol/L 4.9  4.5  4.4   Chloride 98 - 111 mmol/L 101  105  107   CO2 22 - 32 mmol/L 23  23  21    Calcium 8.9 - 10.3 mg/dL 8.8  8.7  8.4       Microbiology: MSSA bactermia Studies/Results: No results found.    Assessment/Plan: ?Diabetes mellulits with peripheral neuropathy with ulcer rt heel with infection   Staph aureus bacteremia 1 of 2 culture- time to positivity around 18 hours  Source rt foot ulcer  2 d echo no vegetation Will  need TEE as we dont know the duration of the Staph bacteremia After 48 hrs of antibiotic will repeat blood culture Pt will need IV antibiotics on discharge-  Pt says he has never done IV drugs Past h/o cocaine use   DM on insulin   PAD Left BKA     Current smoker   Secondary erythrocytosis    Discussed the management with the patient and hospitalist ID will not see him ths weekend On call physician available by phone for urgent issues

## 2023-05-15 NOTE — Care Management Important Message (Signed)
 Important Message  Patient Details  Name: Corey Foster MRN: [id number] Date of Birth: 08/22/1955   Important Message Given:  Yes - Medicare IM     Cristela Blue, CMA 05/15/2023, 11:26 AM

## 2023-05-16 ENCOUNTER — Inpatient Hospital Stay: Admit: 2023-05-16

## 2023-05-16 DIAGNOSIS — L97415 Non-pressure chronic ulcer of right heel and midfoot with muscle involvement without evidence of necrosis: Secondary | ICD-10-CM | POA: Diagnosis not present

## 2023-05-16 DIAGNOSIS — E11621 Type 2 diabetes mellitus with foot ulcer: Secondary | ICD-10-CM | POA: Diagnosis not present

## 2023-05-16 LAB — BASIC METABOLIC PANEL
Anion gap: 6 (ref 5–15)
BUN: 33 mg/dL — ABNORMAL HIGH (ref 8–23)
CO2: 26 mmol/L (ref 22–32)
Calcium: 8.5 mg/dL — ABNORMAL LOW (ref 8.9–10.3)
Chloride: 99 mmol/L (ref 98–111)
Creatinine, Ser: 1.29 mg/dL — ABNORMAL HIGH (ref 0.61–1.24)
GFR, Estimated: >60 mL/min (ref >60)
Glucose, Bld: 504 mg/dL (ref 70–99)
Potassium: 4.8 mmol/L (ref 3.5–5.1)
Sodium: 131 mmol/L — ABNORMAL LOW (ref 135–145)

## 2023-05-16 LAB — GLUCOSE, CAPILLARY
Glucose-Capillary: 487 mg/dL — ABNORMAL HIGH (ref 70–99)
Glucose-Capillary: 363 mg/dL — ABNORMAL HIGH (ref 70–99)
Glucose-Capillary: 207 mg/dL — ABNORMAL HIGH (ref 70–99)
Glucose-Capillary: 190 mg/dL — ABNORMAL HIGH (ref 70–99)
Glucose-Capillary: 298 mg/dL — ABNORMAL HIGH (ref 70–99)

## 2023-05-16 LAB — CBC
HCT: 45.2 % (ref 39.0–52.0)
Hemoglobin: 15.4 g/dL (ref 13.0–17.0)
MCH: 31.1 pg (ref 26.0–34.0)
MCHC: 34.1 g/dL (ref 30.0–36.0)
MCV: 91.3 fL (ref 80.0–100.0)
Platelets: 179 10*3/uL (ref 150–400)
RBC: 4.95 MIL/uL (ref 4.22–5.81)
RDW: 13.4 % (ref 11.5–15.5)
WBC: 4.8 10*3/uL (ref 4.0–10.5)
nRBC: 0 % (ref 0.0–0.2)

## 2023-05-16 MED ORDER — INSULIN ASPART 100 UNIT/ML IJ SOLN
20.0000 [IU] | Freq: Once | INTRAMUSCULAR | Status: AC
  Administered 2023-05-16: 20 [IU] via SUBCUTANEOUS
  Filled 2023-05-16: qty 1

## 2023-05-16 MED ORDER — INSULIN GLARGINE 100 UNIT/ML ~~LOC~~ SOLN
40.0000 [IU] | Freq: Every day | SUBCUTANEOUS | Status: DC
  Administered 2023-05-17 – 2023-05-19 (×3): 40 [IU] via SUBCUTANEOUS
  Filled 2023-05-16 (×3): qty 0.4

## 2023-05-16 NOTE — Plan of Care (Signed)

## 2023-05-16 NOTE — Progress Notes (Signed)
 Progress Note   Patient: Corey Foster:[id number] DOB: 10-31-1955 DOA: 05/12/2023     4 DOS: the patient was seen and examined on 05/16/2023   Brief hospital course:  RIKKI SMESTAD is a 68 y.o. male with medical history significant for DM,  diabetic neuropathy, PVD s/p left BKA, sent by podiatry for evaluation of nonhealing ulcer right heel in spite of outpatient antibiotics and wound care.  He reports pain in the foot but denies fever or chills. ED course and data review: Vitals within normal limits Labs with normal WBC and lactic acid, normal creatinine, mostly unremarkable. X-ray of the foot showing soft tissue ulcer plantar aspect of heel without evidence of osteomyelitis Patient started on clindamycin Hospitalist consulted for admission   Assessment and Plan:  Diabetic foot ulcer right heel with infection (HCC) Cellulitis right lower extremity Was initially on Zosyn and vancomycin which was discontinued on 3/19, currently on cefazolin and metronidazole as per ID Pain control Vascular consulted, s/p RLE angiogram: normal common femoral artery, profunda femoris artery, superficial femoral and popliteal arteries.  There was a high takeoff of the posterior tibial artery but this was large and continuous into the foot without focal stenosis.  The peroneal artery was also fairly large and continued down to the ankle with good collateral flow into the foot.  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  Podiatry consulted, s/p excisional subcutaneous debridement of ulcer done on 3/19, order placed for dressing and wound VAC as per podiatry and follow wound culture. Wound culture yields moderate group B strep agalactiae    # Staph Aureus bacteremia due to right heel ulcer 3/18 Blood culture growing Staph aureus MSSA 3/19 started cefazolin and metronidazole as per ID Follow up results of TTE to r/o Endocarditis Appreciate ID input and she recommended repeat blood cultures after  48 hours of antibiotic therapy.  Repeat cultures show no growth after 12 hours Discussed with ID and she recommends a TEE. Consult placed for TEE with plans for it to be done on Monday 03/24 Patient will likely need IV Abx on discharge per ID, he also has a wound vac and is NWB on his right leg. Will likely need SNF for IV abx. TOC consult placed.      PVD (peripheral vascular disease) History of left BKA Continue aspirin and simvastatin    Diabetes mellitus due to underlying condition with diabetic neuropathy and hyperglycemia Continue basal insulin but increase dose to 30 units daily Add Premeal insulin, 8 units with meals Continue sliding scale insulin coverage Continue consistent carbohydrate diet    Chronic degenerative disc disease, chronic lower backache Continue as needed medication for pain control   Vitamin D level 31, borderline low started oral supplement. Vitamin B12 level 364, goal >400, started oral supplement to prevent B12 deficiency.    Obesity (BMI 33.75 kg/m2) Obstructive sleep apnea Complicates overall prognosis and care Continue CPAP at bedtime       Subjective: No significant events overnight, patient is having backache 7-8/10, which is chronic.  Denied any other complaints.   Physical Exam: Vitals:   05/15/23 1633 05/15/23 2215 05/16/23 0326 05/16/23 0742  BP: 124/77 137/86 (!) 136/92 123/77  Pulse: 82 86 80 81  Resp: 17 14 18 17   Temp: 98.2 F (36.8 C) 98.1 F (36.7 C) 98.6 F (37 C) 98 F (36.7 C)  TempSrc:    Oral  SpO2: 97% 98% 99% 99%  Weight:      Height:  General: NAD, lying comfortably Appear in no distress, affect appropriate Eyes: PERRLA ENT: Oral Mucosa Clear, moist  Neck: no JVD,  Cardiovascular: S1 and S2 Present, no Murmur,  Respiratory: good respiratory effort, Bilateral Air entry equal and Decreased, no Crackles, no wheezes Abdomen: Bowel Sound present, Soft and no tenderness,  Skin: no rashes Extremities: no  Pedal edema, no calf tenderness, s/p debridement of right heel ulcer, wound VAC attached.  S/p Left AKA (chronic) Neurologic: without any new focal findings    Data Reviewed:  Labs reviewed  Family Communication: Plan of care discussed with patient in detail.  He verbalizes understanding and agrees with the plan.  Disposition: Status is: Inpatient Remains inpatient appropriate because: Plan of care was discussed with patient in detail.  He verbalizes understanding and agrees with the plan.  Planned Discharge Destination: Home    Time spent: 35  minutes  Author: Gillis Santa, MD 05/16/2023 2:50 PM  For on call review [email or web address].

## 2023-05-17 DIAGNOSIS — L97415 Non-pressure chronic ulcer of right heel and midfoot with muscle involvement without evidence of necrosis: Secondary | ICD-10-CM | POA: Diagnosis not present

## 2023-05-17 DIAGNOSIS — E11621 Type 2 diabetes mellitus with foot ulcer: Secondary | ICD-10-CM | POA: Diagnosis not present

## 2023-05-17 LAB — AEROBIC/ANAEROBIC CULTURE W GRAM STAIN (SURGICAL/DEEP WOUND)

## 2023-05-17 LAB — CBC
HCT: 45.5 % (ref 39.0–52.0)
Hemoglobin: 15.5 g/dL (ref 13.0–17.0)
MCH: 31.3 pg (ref 26.0–34.0)
MCHC: 34.1 g/dL (ref 30.0–36.0)
MCV: 91.7 fL (ref 80.0–100.0)
Platelets: 181 10*3/uL (ref 150–400)
RBC: 4.96 MIL/uL (ref 4.22–5.81)
RDW: 13.4 % (ref 11.5–15.5)
WBC: 6.1 10*3/uL (ref 4.0–10.5)
nRBC: 0 % (ref 0.0–0.2)

## 2023-05-17 LAB — BASIC METABOLIC PANEL
Anion gap: 7 (ref 5–15)
BUN: 37 mg/dL — ABNORMAL HIGH (ref 8–23)
CO2: 25 mmol/L (ref 22–32)
Calcium: 8.3 mg/dL — ABNORMAL LOW (ref 8.9–10.3)
Chloride: 100 mmol/L (ref 98–111)
Creatinine, Ser: 1.3 mg/dL — ABNORMAL HIGH (ref 0.61–1.24)
GFR, Estimated: 60 mL/min — ABNORMAL LOW (ref >60)
Glucose, Bld: 219 mg/dL — ABNORMAL HIGH (ref 70–99)
Potassium: 4.8 mmol/L (ref 3.5–5.1)
Sodium: 132 mmol/L — ABNORMAL LOW (ref 135–145)

## 2023-05-17 LAB — GLUCOSE, CAPILLARY
Glucose-Capillary: 216 mg/dL — ABNORMAL HIGH (ref 70–99)
Glucose-Capillary: 237 mg/dL — ABNORMAL HIGH (ref 70–99)
Glucose-Capillary: 300 mg/dL — ABNORMAL HIGH (ref 70–99)
Glucose-Capillary: 269 mg/dL — ABNORMAL HIGH (ref 70–99)

## 2023-05-17 LAB — CULTURE, BLOOD (ROUTINE X 2)
Culture: NO GROWTH
Special Requests: ADEQUATE

## 2023-05-17 MED ORDER — SODIUM CHLORIDE 0.9 % IV SOLN: INTRAVENOUS | Status: AC

## 2023-05-17 NOTE — Plan of Care (Signed)
  Problem: Education: Goal: Ability to describe self-care measures that may prevent or decrease complications (Diabetes Survival Skills Education) will improve Outcome: Progressing   Problem: Coping: Goal: Ability to adjust to condition or change in health will improve Outcome: Progressing   Problem: Skin Integrity: Goal: Risk for impaired skin integrity will decrease Outcome: Progressing   Problem: Education: Goal: Knowledge of General Education information will improve Description: Including pain rating scale, medication(s)/side effects and non-pharmacologic comfort measures Outcome: Progressing

## 2023-05-17 NOTE — Progress Notes (Signed)
 Progress Note   Patient: Corey Foster ZOX:[id number] DOB: 02-21-1956 DOA: 05/12/2023     5 DOS: the patient was seen and examined on 05/17/2023   Brief hospital course:  VOLLIE BRUNTY is a 68 y.o. male with medical history significant for DM,  diabetic neuropathy, PVD s/p left BKA, sent by podiatry for evaluation of nonhealing ulcer right heel in spite of outpatient antibiotics and wound care.  He reports pain in the foot but denies fever or chills. ED course and data review: Vitals within normal limits Labs with normal WBC and lactic acid, normal creatinine, mostly unremarkable. X-ray of the foot showing soft tissue ulcer plantar aspect of heel without evidence of osteomyelitis Patient started on clindamycin Hospitalist consulted for admission   Assessment and Plan:  Diabetic foot ulcer right heel with infection (HCC) Cellulitis right lower extremity Was initially on Zosyn and vancomycin which was discontinued on 3/19, currently on cefazolin and metronidazole as per ID Pain control Vascular consulted, s/p RLE angiogram: normal common femoral artery, profunda femoris artery, superficial femoral and popliteal arteries.  There was a high takeoff of the posterior tibial artery but this was large and continuous into the foot without focal stenosis.  The peroneal artery was also fairly large and continued down to the ankle with good collateral flow into the foot.  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  Podiatry consulted, s/p excisional subcutaneous debridement of ulcer done on 3/19, order placed for dressing and wound VAC as per podiatry and follow wound culture. Wound culture yields moderate group B strep agalactiae    # Staph Aureus bacteremia due to right heel ulcer 3/18 Blood culture growing Staph aureus MSSA 3/19 started cefazolin and metronidazole as per ID Follow up results of TTE to r/o Endocarditis Appreciate ID input and she recommended repeat blood cultures after  48 hours of antibiotic therapy.  Repeat cultures show no growth after 12 hours Discussed with ID and she recommends a TEE. Consult placed for TEE with plans for it to be done on Monday 03/24 Patient will likely need IV Abx on discharge per ID, he also has a wound vac and is NWB on his right leg. Will likely need SNF for IV abx. TOC consult placed.      PVD (peripheral vascular disease) History of left BKA Continue aspirin and simvastatin    Diabetes mellitus due to underlying condition with diabetic neuropathy and hyperglycemia Continue basal insulin but increase dose to 30 units daily Add Premeal insulin, 8 units with meals Continue sliding scale insulin coverage Continue consistent carbohydrate diet    Chronic degenerative disc disease, chronic lower backache Continue as needed medication for pain control   Vitamin D level 31, borderline low started oral supplement. Vitamin B12 level 364, goal >400, started oral supplement to prevent B12 deficiency.    Obesity (BMI 33.75 kg/m2) Obstructive sleep apnea Complicates overall prognosis and care Continue CPAP at bedtime       Subjective: No significant events overnight, c/o chronic lower backache 7/10, no nasal complaints. Patient is aware about TEE tomorrow a.m.   Physical Exam: Vitals:   05/16/23 1634 05/16/23 1944 05/17/23 0515 05/17/23 0728  BP: 114/61 126/73 125/77 115/68  Pulse: 79 82 79 72  Resp: 16   16  Temp: 98.3 F (36.8 C)  98.4 F (36.9 C) 97.8 F (36.6 C)  TempSrc: Oral  Oral Oral  SpO2: 95% 99% 98% 100%  Weight:      Height:  General: NAD, lying comfortably Appear in no distress, affect appropriate Eyes: PERRLA ENT: Oral Mucosa Clear, moist  Neck: no JVD,  Cardiovascular: S1 and S2 Present, no Murmur,  Respiratory: good respiratory effort, Bilateral Air entry equal and Decreased, no Crackles, no wheezes Abdomen: Bowel Sound present, Soft and no tenderness,  Skin: no rashes Extremities: no  Pedal edema, no calf tenderness, s/p debridement of right heel ulcer, wound VAC attached.  S/p Left AKA (chronic) Neurologic: without any new focal findings    Data Reviewed:  Labs reviewed  Family Communication: Plan of care discussed with patient in detail.  He verbalizes understanding and agrees with the plan.  Disposition: Status is: Inpatient Remains inpatient appropriate because: Plan of care was discussed with patient in detail.  He verbalizes understanding and agrees with the plan.  Planned Discharge Destination: Home    Time spent: 35  minutes  Author: Gillis Santa, MD 05/17/2023 1:51 PM  For on call review [email or web address].

## 2023-05-17 NOTE — Plan of Care (Signed)

## 2023-05-18 ENCOUNTER — Encounter: Payer: Self-pay | Admitting: Internal Medicine

## 2023-05-18 ENCOUNTER — Inpatient Hospital Stay
Admit: 2023-05-18 | Discharge: 2023-05-18 | Disposition: A | Discharge status: Home / Self Care | Attending: Internal Medicine | Admitting: Internal Medicine

## 2023-05-18 DIAGNOSIS — E11621 Type 2 diabetes mellitus with foot ulcer: Secondary | ICD-10-CM | POA: Diagnosis not present

## 2023-05-18 DIAGNOSIS — L97415 Non-pressure chronic ulcer of right heel and midfoot with muscle involvement without evidence of necrosis: Secondary | ICD-10-CM | POA: Diagnosis not present

## 2023-05-18 LAB — CBC
HCT: 46.2 % (ref 39.0–52.0)
Hemoglobin: 15.5 g/dL (ref 13.0–17.0)
MCH: 31.4 pg (ref 26.0–34.0)
MCHC: 33.5 g/dL (ref 30.0–36.0)
MCV: 93.5 fL (ref 80.0–100.0)
Platelets: 173 10*3/uL (ref 150–400)
RBC: 4.94 MIL/uL (ref 4.22–5.81)
RDW: 13.4 % (ref 11.5–15.5)
WBC: 5.6 10*3/uL (ref 4.0–10.5)
nRBC: 0 % (ref 0.0–0.2)

## 2023-05-18 LAB — GLUCOSE, CAPILLARY
Glucose-Capillary: 197 mg/dL — ABNORMAL HIGH (ref 70–99)
Glucose-Capillary: 144 mg/dL — ABNORMAL HIGH (ref 70–99)
Glucose-Capillary: 306 mg/dL — ABNORMAL HIGH (ref 70–99)

## 2023-05-18 LAB — BASIC METABOLIC PANEL
Anion gap: 9 (ref 5–15)
BUN: 37 mg/dL — ABNORMAL HIGH (ref 8–23)
CO2: 26 mmol/L (ref 22–32)
Calcium: 8.6 mg/dL — ABNORMAL LOW (ref 8.9–10.3)
Chloride: 100 mmol/L (ref 98–111)
Creatinine, Ser: 1.28 mg/dL — ABNORMAL HIGH (ref 0.61–1.24)
GFR, Estimated: >60 mL/min (ref >60)
Glucose, Bld: 204 mg/dL — ABNORMAL HIGH (ref 70–99)
Potassium: 5.2 mmol/L — ABNORMAL HIGH (ref 3.5–5.1)
Sodium: 135 mmol/L (ref 135–145)

## 2023-05-18 LAB — MAGNESIUM: Magnesium: 2.5 mg/dL — ABNORMAL HIGH (ref 1.7–2.4)

## 2023-05-18 LAB — PHOSPHORUS: Phosphorus: 3.1 mg/dL (ref 2.5–4.6)

## 2023-05-18 MED ORDER — SODIUM ZIRCONIUM CYCLOSILICATE 10 G PO PACK
10.0000 g | PACK | Freq: Three times a day (TID) | ORAL | Status: AC
  Administered 2023-05-18 (×2): 10 g via ORAL
  Filled 2023-05-18 (×2): qty 1

## 2023-05-18 MED ORDER — SODIUM CHLORIDE 0.9 % IV SOLN: INTRAVENOUS | Status: DC

## 2023-05-18 MED ORDER — LIDOCAINE VISCOUS HCL 2 % MT SOLN
OROMUCOSAL | Status: AC
  Filled 2023-05-18: qty 15

## 2023-05-18 MED ORDER — BUTAMBEN-TETRACAINE-BENZOCAINE 2-2-14 % EX AERO
INHALATION_SPRAY | CUTANEOUS | Status: AC
  Filled 2023-05-18: qty 5

## 2023-05-18 NOTE — Progress Notes (Signed)
 Progress Note   Patient: Corey Foster ZOX:[id number] DOB: 1955/07/17 DOA: 05/12/2023     6 DOS: the patient was seen and examined on 05/18/2023   Brief hospital course:  Corey Foster is a 68 y.o. male with medical history significant for DM,  diabetic neuropathy, PVD s/p left BKA, sent by podiatry for evaluation of nonhealing ulcer right heel in spite of outpatient antibiotics and wound care.  He reports pain in the foot but denies fever or chills. ED course and data review: Vitals within normal limits Labs with normal WBC and lactic acid, normal creatinine, mostly unremarkable. X-ray of the foot showing soft tissue ulcer plantar aspect of heel without evidence of osteomyelitis Patient started on clindamycin Hospitalist consulted for admission   Assessment and Plan:  Diabetic foot ulcer right heel with infection (HCC) Cellulitis right lower extremity Was initially on Zosyn and vancomycin which was discontinued on 3/19, currently on cefazolin and metronidazole as per ID Pain control Vascular consulted, s/p RLE angiogram: normal common femoral artery, profunda femoris artery, superficial femoral and popliteal arteries.  There was a high takeoff of the posterior tibial artery but this was large and continuous into the foot without focal stenosis.  The peroneal artery was also fairly large and continued down to the ankle with good collateral flow into the foot.  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  Podiatry consulted, s/p excisional subcutaneous debridement of ulcer done on 3/19, order placed for dressing and wound VAC as per podiatry and follow wound culture. Wound culture yields moderate group B strep agalactiae    # Staph Aureus bacteremia due to right heel ulcer 3/18 Blood culture growing Staph aureus MSSA 3/19 started cefazolin and metronidazole as per ID Follow up results of TTE to r/o Endocarditis Appreciate ID input and she recommended repeat blood cultures after  48 hours of antibiotic therapy.  Repeat cultures show no growth after 12 hours Discussed with ID and she recommends a TEE. Consult placed for TEE with plans for it to be done on Tuesday 03/25 Patient will likely need IV Abx on discharge per ID, he also has a wound vac and is NWB on his right leg. Will likely need SNF for IV abx. TOC consult placed. TEE postponed to tomorrow as per RN     PVD (peripheral vascular disease) History of left BKA Continue aspirin and simvastatin    Diabetes mellitus due to underlying condition with diabetic neuropathy and hyperglycemia Continue basal insulin but increase dose to 30 units daily Add Premeal insulin, 8 units with meals Continue sliding scale insulin coverage Continue consistent carbohydrate diet    Chronic degenerative disc disease, chronic lower backache Continue as needed medication for pain control   Vitamin D level 31, borderline low started oral supplement. Vitamin B12 level 364, goal >400, started oral supplement to prevent B12 deficiency.    Obesity (BMI 33.75 kg/m2) Obstructive sleep apnea Complicates overall prognosis and care Continue CPAP at bedtime       Subjective: No significant events overnight, still has chronic back pain, stated that it is killing me.  Denied any other complaints.    Physical Exam: Vitals:   05/18/23 0610 05/18/23 0859 05/18/23 1243 05/18/23 1336  BP: 133/83 116/81 108/66 114/76  Pulse: 78 73 66 73  Resp: 20 18 12 18   Temp: 98 F (36.7 C) 97.8 F (36.6 C) 98 F (36.7 C) 97.8 F (36.6 C)  TempSrc:   Oral   SpO2: 99% 97% 98% 97%  Weight:   109.8 kg   Height:   5\' 11"  (1.803 m)    General: NAD, lying comfortably Appear in no distress, affect appropriate Eyes: PERRLA ENT: Oral Mucosa Clear, moist  Neck: no JVD,  Cardiovascular: S1 and S2 Present, no Murmur,  Respiratory: good respiratory effort, Bilateral Air entry equal and Decreased, no Crackles, no wheezes Abdomen: Bowel Sound  present, Soft and no tenderness,  Skin: no rashes Extremities: no Pedal edema, no calf tenderness, s/p debridement of right heel ulcer, wound VAC attached.  S/p Left AKA (chronic) Neurologic: without any new focal findings    Data Reviewed:  Labs reviewed  Family Communication: Plan of care discussed with patient in detail.  He verbalizes understanding and agrees with the plan.  Disposition: Status is: Inpatient Remains inpatient appropriate because: Plan of care was discussed with patient in detail.  He verbalizes understanding and agrees with the plan.  Planned Discharge Destination: Home    Time spent: 35  minutes  Author: Gillis Santa, MD 05/18/2023 2:03 PM  For on call review [email or web address].

## 2023-05-18 NOTE — Anesthesia Preprocedure Evaluation (Addendum)
 Anesthesia Evaluation  Patient identified by MRN, date of birth, ID band Patient awake    Reviewed: Allergy & Precautions, NPO status , Patient's Chart, lab work & pertinent test results  History of Anesthesia Complications Negative for: history of anesthetic complications  Airway Mallampati: III   Neck ROM: Full    Dental  (+) Missing   Pulmonary sleep apnea and Continuous Positive Airway Pressure Ventilation , Current Smoker (4-5 cigarettes per day) and Patient abstained from smoking.   Pulmonary exam normal breath sounds clear to auscultation       Cardiovascular hypertension, + Peripheral Vascular Disease (s/p L BKA)   Rhythm:Irregular Rate:Normal     Neuro/Psych  PSYCHIATRIC DISORDERS Anxiety      Neuromuscular disease (diabetic neuropathy)    GI/Hepatic ,GERD  ,,  Endo/Other  diabetes, Type 2, Insulin Dependent  Obesity   Renal/GU Renal disease (stage III CKD)     Musculoskeletal  (+) Arthritis ,    Abdominal   Peds  Hematology negative hematology ROS (+)   Anesthesia Other Findings   Reproductive/Obstetrics                             Anesthesia Physical Anesthesia Plan  ASA: 3  Anesthesia Plan: General   Post-op Pain Management:    Induction: Intravenous  PONV Risk Score and Plan: 1 and Propofol infusion, TIVA and Treatment may vary due to age or medical condition  Airway Management Planned: Natural Airway  Additional Equipment:   Intra-op Plan:   Post-operative Plan:   Informed Consent: I have reviewed the patients History and Physical, chart, labs and discussed the procedure including the risks, benefits and alternatives for the proposed anesthesia with the patient or authorized representative who has indicated his/her understanding and acceptance.       Plan Discussed with: CRNA  Anesthesia Plan Comments: (LMA/GETA backup discussed.  Patient consented for  risks of anesthesia including but not limited to:  - adverse reactions to medications - damage to eyes, teeth, lips or other oral mucosa - nerve damage due to positioning  - sore throat or hoarseness - damage to heart, brain, nerves, lungs, other parts of body or loss of life  Informed patient about role of CRNA in peri- and intra-operative care.  Patient voiced understanding.)       Anesthesia Quick Evaluation

## 2023-05-18 NOTE — Evaluation (Signed)
 Physical Therapy Evaluation Patient Details Name: Corey Foster MRN: [id number] DOB: 11/06/1955 Today's Date: 05/18/2023  History of Present Illness  68 y.o. male male with medical history significant for DM,  diabetic neuropathy, PVD s/p left BKA, sent by podiatry for evaluation of nonhealing ulcer right heel in spite of outpatient antibiotics and wound care  Clinical Impression  PT/OT co-evaluation performed this date for energy conservation. Pt is a pleasant 68 year old male who was admitted for R heel nonhealing ulcer. Pt demonstrates all bed mobility/transfers/ambulation at baseline level with ability to maintain correct wbing restrictions. Pt does not require any further PT needs at this time. Pt will be dc in house and does not require follow up. Will recommend mobility services and nursing staff to assist with mobility needs. Will dc current orders.       If plan is discharge home, recommend the following: Assist for transportation;Help with stairs or ramp for entrance   Can travel by private vehicle        Equipment Recommendations None recommended by PT  Recommendations for Other Services       Functional Status Assessment Patient has not had a recent decline in their functional status     Precautions / Restrictions Precautions Precautions: Fall Recall of Precautions/Restrictions: Intact Restrictions Weight Bearing Restrictions Per Provider Order: Yes RLE Weight Bearing Per Provider Order: Touchdown weight bearing LLE Weight Bearing Per Provider Order: Non weight bearing      Mobility  Bed Mobility Overal bed mobility: Independent             General bed mobility comments: safe technique with ease of transition    Transfers Overall transfer level: Modified independent Equipment used: None               General transfer comment: able to perform SPT from bed<>transport chair with mod I. Needed equipment set up and supervision for line/lead  management. Able to comply with wbing restrictions.    Ambulation/Gait               General Gait Details: not able due to wb restrictions  Stairs            Wheelchair Mobility     Tilt Bed    Modified Rankin (Stroke Patients Only)       Balance Overall balance assessment: Modified Independent                                           Pertinent Vitals/Pain Pain Assessment Pain Assessment: No/denies pain    Home Living Family/patient expects to be discharged to:: Private residence Living Arrangements: Children (daughter) Available Help at Discharge: Family;Available 24 hours/day Type of Home: Apartment Home Access: Level entry       Home Layout: One level Home Equipment: Agricultural consultant (2 wheels);Cane - single point;Hospital bed;Wheelchair - manual;BSC/3in1 Additional Comments: pt reports he just received hospital bed for home.    Prior Function Prior Level of Function : Needs assist             Mobility Comments: transfers betwen surfaces and wheelchair ADLs Comments: indep with ADLs and med management. Reports no falls     Extremity/Trunk Assessment   Upper Extremity Assessment Upper Extremity Assessment: Overall WFL for tasks assessed    Lower Extremity Assessment Lower Extremity Assessment: Overall WFL for tasks assessed  Communication   Communication Communication: No apparent difficulties    Cognition Arousal: Alert Behavior During Therapy: WFL for tasks assessed/performed   PT - Cognitive impairments: No apparent impairments                       PT - Cognition Comments: pleasant and agreeable to session Following commands: Intact       Cueing Cueing Techniques: Verbal cues     General Comments      Exercises     Assessment/Plan    PT Assessment All further PT needs can be met in the next venue of care  PT Problem List Decreased balance;Decreased mobility;Decreased skin  integrity       PT Treatment Interventions      PT Goals (Current goals can be found in the Care Plan section)  Acute Rehab PT Goals Patient Stated Goal: to go home PT Goal Formulation: All assessment and education complete, DC therapy Time For Goal Achievement: 05/18/23 Potential to Achieve Goals: Good    Frequency       Co-evaluation               AM-PAC PT "6 Clicks" Mobility  Outcome Measure Help needed turning from your back to your side while in a flat bed without using bedrails?: None Help needed moving from lying on your back to sitting on the side of a flat bed without using bedrails?: None Help needed moving to and from a bed to a chair (including a wheelchair)?: None Help needed standing up from a chair using your arms (e.g., wheelchair or bedside chair)?: None Help needed to walk in hospital room?: Total Help needed climbing 3-5 steps with a railing? : Total 6 Click Score: 18    End of Session   Activity Tolerance: Patient tolerated treatment well Patient left: in bed Nurse Communication: Mobility status PT Visit Diagnosis: Difficulty in walking, not elsewhere classified (R26.2)    Time: 1610-9604 PT Time Calculation (min) (ACUTE ONLY): 16 min   Charges:   PT Evaluation $PT Eval Low Complexity: 1 Low   PT General Charges $$ ACUTE PT VISIT: 1 Visit         Elizabeth Palau, PT, DPT, GCS [phone]   Mareli Antunes 05/18/2023, 3:40 PM

## 2023-05-18 NOTE — Progress Notes (Signed)
 TEE scheduled for 12:30 today.

## 2023-05-18 NOTE — Consult Note (Signed)
 WOC Nurse wound follow up Wound type: surgical  S/P debridement per podiatry  Measurement: 2.5cm x 2.0cm x 0.5cm  Wound bed: pale, non granular  Drainage (amount, consistency, odor) none Periwound: hyperkeratotic  Dressing procedure/placement/frequency: Removed old NPWT dressing Skin protected to the dorsal foot with VAC drape for foam bridge  Filled wound with 1pc for bridge and 1 piece of black foam for wound bed.  Sealed NPWT dressing at HG/134mmHG  Patient received IV pain medication per bedside nurse prior to dressing change, however appears during dressing patient has insensate foot Patient tolerated procedure well  WOC nurse will continue to provide NPWT dressing changed due to the complexity of the dressing change. Q Mon/Thur    Riddhi Grether [employer], RN,CWOCN, CNS, CWON-AP [phone])

## 2023-05-18 NOTE — Inpatient Diabetes Management (Signed)
 Inpatient Diabetes Program Recommendations  AACE/ADA: New Consensus Statement on Inpatient Glycemic Control  Target Ranges:  Prepandial:   less than 140 mg/dL      Peak postprandial:   less than 180 mg/dL (1-2 hours)      Critically ill patients:  140 - 180 mg/dL    Latest Reference Range & Units 05/17/23 07:29 05/17/23 11:23 05/17/23 17:41 05/17/23 19:53 05/18/23 08:04  Glucose-Capillary 70 - 99 mg/dL 161 (H) 096 (H) 045 (H) 269 (H) 197 (H)   Review of Glycemic Control  Diabetes history: DM2 Outpatient Diabetes medications: Toujeo 80 units QAM, Toujeo 120 units at bedtime, Novolog 40 units TID, Jardiance 25 mg daily  Current orders for Inpatient glycemic control: Lantus 40 units daily, Novolog 8 units TID with meals, Novolog 0-15 units AC&HS  Inpatient Diabetes Program Recommendations:    Insulin: Please consider increasing Lantus to 43 units daily and meal coverage to Novolog 13 units TID with meals.  Thanks, Orlando Penner, RN, MSN, CDCES Diabetes Coordinator Inpatient Diabetes Program [phone] (Team Pager from 8am to 5pm)

## 2023-05-18 NOTE — [place] FL2 (Signed)
 [place] MEDICAID FL2 LEVEL OF CARE FORM     IDENTIFICATION  Patient Name: Corey Foster Birthdate: 07-29-1955 Sex: male Admission Date (Current Location): 05/12/2023  [facility] and [place] Number:  Chiropodist and Address:  [facility], [address] [place], [place] [address]      Provider Number: [id number]  Attending Physician Name and Address:  Gillis Santa, MD  Relative Name and Phone Number:  Loreen Freud  Son  Emergency Contact  [phone]    Current Level of Care: Hospital Recommended Level of Care: Skilled Nursing Facility Prior Approval Number:    Date Approved/Denied:   PASRR Number: [id number]  Discharge Plan: SNF    Current Diagnoses: Patient Active Problem List   Diagnosis Date Noted   MSSA bacteremia 05/14/2023   Diabetic foot ulcer right heel with infection (HCC) 05/12/2023   PVD (peripheral vascular disease) (HCC) 05/12/2023   Diabetic ulcer of right heel (HCC) 05/12/2023   Bacteremia 05/11/2023   Hx of BKA, left (HCC) 04/21/2023   Critical limb ischemia of left lower extremity (HCC) 01/08/2023   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) 01/07/2023   Diabetic foot ulcer associated with secondary diabetes mellitus (HCC) 12/17/2022   Dry gangrene (HCC) 12/17/2022   Wound infection after surgery 12/09/2022   Left foot pain 12/08/2022   Hypoglycemia 12/08/2022   Gangrene (HCC) 11/28/2022   Foot osteomyelitis, left (HCC) 11/27/2022   Hyperosmolar hyponatremia 11/27/2022   CKD stage 3b, GFR 30-44 ml/min (HCC) 11/27/2022   DKA (diabetic ketoacidosis) (HCC) 02/07/2021   Acute renal failure superimposed on stage 3a chronic kidney disease (HCC) 02/07/2021   HLD (hyperlipidemia) 02/07/2021   Anxiety 02/07/2021   Fall at home, initial encounter 02/07/2021   Hyperkalemia 02/07/2021   Elevated lactic acid level 02/07/2021   Type II diabetes mellitus with renal manifestations (HCC) 02/07/2021    Hyperglycemia    History of bladder cancer 09/05/2019   BMI 40.0-44.9, adult (HCC) 03/02/2017   Smoking 03/02/2017   Left-sided low back pain with left-sided sciatica 11/08/2014   Lumbar radiculopathy 11/08/2014   DDD (degenerative disc disease), lumbosacral 11/08/2014   Hip pain, chronic 11/08/2014   Sacro-iliac pain 11/08/2014   HTN (hypertension) 11/08/2014   Diabetes mellitus due to underlying condition with diabetic neuropathy (HCC) 11/08/2014   DDD (degenerative disc disease), cervical 11/08/2014   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   Incomplete bladder emptying 05/23/2013   Elevated prostate specific antigen (PSA) 04/12/2012   Benign prostatic hyperplasia with urinary obstruction 01/14/2012   Decreased libido 01/14/2012   Other long term (current) drug therapy 01/14/2012   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   Flu vaccine need 01/14/2012   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   Encounter for long-term (current) use of medications 01/14/2012    Orientation RESPIRATION BLADDER Height & Weight     Self, Time, Situation, Place  Normal Continent Weight: 109.8 kg Height:  5\' 11"  (180.3 cm)  BEHAVIORAL SYMPTOMS/MOOD NEUROLOGICAL BOWEL NUTRITION STATUS      Continent Diet (see dc summary)  AMBULATORY STATUS COMMUNICATION OF NEEDS Skin   Extensive Assist Verbally Normal, Wound Vac, Surgical wounds, Other (Comment) (debridement per podiatry   Measurement: 2.5cm x 2.0cm x 0.5cm)                       Personal Care Assistance Level of Assistance              Functional Limitations Info  Sight, Speech, Hearing  Sight Info: Adequate Hearing Info: Adequate Speech Info: Adequate    SPECIAL CARE FACTORS FREQUENCY  PT (By licensed PT), OT (By licensed OT) (IV ABX)     PT Frequency: 5 times per week OT Frequency: 5 times per week            Contractures Contractures Info: Not present    Additional Factors Info  Code Status, Allergies Code  Status Info: full code Allergies Info: NKDA           Current Medications (05/18/2023):  This is the current hospital active medication list Current Facility-Administered Medications  Medication Dose Route Frequency Provider Last Rate Last Admin   0.9 %  sodium chloride infusion   Intravenous Continuous Gillis Santa, MD 100 mL/hr at 05/18/23 1010 New Bag at 05/18/23 1010   [MAR Hold] acetaminophen (TYLENOL) tablet 650 mg  650 mg Oral Q6H PRN Gillis Santa, MD       Or   Mitzi Hansen Hold] acetaminophen (TYLENOL) suppository 650 mg  650 mg Rectal Q6H PRN Gillis Santa, MD       Mitzi Hansen Hold] ALPRAZolam Prudy Feeler) tablet 0.5 mg  0.5 mg Oral TID PRN Agbata, Tochukwu, MD   0.5 mg at 05/17/23 2112   [facility] Hold] ascorbic acid (VITAMIN C) tablet 500 mg  500 mg Oral BID Gillis Santa, MD   500 mg at 05/17/23 2112   [facility] Hold] aspirin EC tablet 325 mg  325 mg Oral Daily Annice Needy, MD   325 mg at 05/18/23 0900   [MAR Hold] ceFAZolin (ANCEF) IVPB 2g/100 mL premix  2 g Intravenous Q8H Ravishankar, Rhodia Albright, MD 200 mL/hr at 05/18/23 0615 2 g at 05/18/23 0615   [MAR Hold] cyanocobalamin (VITAMIN B12) tablet 500 mcg  500 mcg Oral Daily Annice Needy, MD   500 mcg at 05/17/23 0842   [MAR Hold] enoxaparin (LOVENOX) injection 55 mg  55 mg Subcutaneous QPM Annice Needy, MD   55 mg at 05/17/23 1758   [MAR Hold] insulin aspart (novoLOG) injection 0-15 Units  0-15 Units Subcutaneous TID WC & HS Agbata, Tochukwu, MD   3 Units at 05/18/23 0900   [MAR Hold] insulin aspart (novoLOG) injection 8 Units  8 Units Subcutaneous TID WC Agbata, Tochukwu, MD   8 Units at 05/17/23 1759   [MAR Hold] insulin glargine (LANTUS) injection 40 Units  40 Units Subcutaneous Daily Gillis Santa, MD   40 Units at 05/18/23 0900   [MAR Hold] metroNIDAZOLE (FLAGYL) tablet 500 mg  500 mg Oral Q12H Ravishankar, Rhodia Albright, MD   500 mg at 05/18/23 0900   [MAR Hold] morphine (PF) 2 MG/ML injection 2 mg  2 mg Intravenous Q2H PRN Annice Needy, MD   2 mg at  05/18/23 0908   [MAR Hold] multivitamin with minerals tablet 1 tablet  1 tablet Oral Daily Gillis Santa, MD   1 tablet at 05/17/23 0842   [MAR Hold] nutrition supplement (JUVEN) (JUVEN) powder packet 1 packet  1 packet Oral BID BM Gillis Santa, MD   1 packet at 05/17/23 1759   [MAR Hold] ondansetron (ZOFRAN) tablet 4 mg  4 mg Oral Q6H PRN Annice Needy, MD       Or   Mitzi Hansen Hold] ondansetron (ZOFRAN) injection 4 mg  4 mg Intravenous Q6H PRN Annice Needy, MD       [MAR Hold] oxyCODONE (Oxy IR/ROXICODONE) immediate release tablet 5 mg  5 mg Oral Q6H PRN Annice Needy, MD   5 mg  at 05/17/23 0841   [MAR Hold] pregabalin (LYRICA) capsule 75 mg  75 mg Oral QPM Annice Needy, MD   75 mg at 05/17/23 1758   [MAR Hold] simvastatin (ZOCOR) tablet 40 mg  40 mg Oral QPM Annice Needy, MD   40 mg at 05/17/23 1758   [MAR Hold] sodium zirconium cyclosilicate (LOKELMA) packet 10 g  10 g Oral TID Gillis Santa, MD   10 g at 05/18/23 0900   [MAR Hold] traZODone (DESYREL) tablet 25 mg  25 mg Oral QHS Agbata, Tochukwu, MD   25 mg at 05/16/23 2126   [facility] Hold] traZODone (DESYREL) tablet 25 mg  25 mg Oral Once Agbata, Elwyn Lade, MD       [MAR Hold] Vitamin D (Ergocalciferol) (DRISDOL) 1.25 MG (50000 UNIT) capsule 50,000 Units  50,000 Units Oral Q7 days Annice Needy, MD   50,000 Units at 05/13/23 1400   [MAR Hold] zinc sulfate (50mg  elemental zinc) capsule 220 mg  220 mg Oral Daily Gillis Santa, MD   220 mg at 05/17/23 4098     Discharge Medications: Please see discharge summary for a list of discharge medications.  Relevant Imaging Results:  Relevant Lab Results:   Additional Information [id number]  Marlowe Sax, RN

## 2023-05-18 NOTE — Evaluation (Signed)
 Occupational Therapy Evaluation Patient Details Name: Corey Foster MRN: [id number] DOB: 06/26/1955 Today's Date: 05/18/2023   History of Present Illness   68 y.o. male male with medical history significant for DM,  diabetic neuropathy, PVD s/p left BKA, sent by podiatry for evaluation of nonhealing ulcer right heel in spite of outpatient antibiotics and wound care     Clinical Impressions Pt was seen for PT/OT co-evaluation this date to maximize pt/therapist safety. Prior to hospital admission, pt was living at home with his daughter who was assisting him with ADLs as needed. He reports being MOD I with SPT from W/C<>commode<>bed. Had a hospital bed delivered and was receiving [facility] therapy. Pt presents to acute OT demonstrating little to no functional decline or ADL impairment from baseline. He demo SPT following TTWB precautions on RLE with MOD I from bed<>transport chair. BUE strength is very good and pt does not have acute OT needs and would like to resume HH therapy upon DC from the hospital.      If plan is discharge home, recommend the following:   A little help with bathing/dressing/bathroom     Functional Status Assessment   Patient has not had a recent decline in their functional status     Equipment Recommendations   None recommended by OT     Recommendations for Other Services         Precautions/Restrictions   Precautions Precautions: Fall Recall of Precautions/Restrictions: Intact Restrictions Weight Bearing Restrictions Per Provider Order: Yes RLE Weight Bearing Per Provider Order: Touchdown weight bearing LLE Weight Bearing Per Provider Order: Non weight bearing     Mobility Bed Mobility Overal bed mobility: Independent             General bed mobility comments: safe technique with ease of transition    Transfers Overall transfer level: Modified independent Equipment used: None               General transfer comment: able to  perform SPT from bed<>transport chair with mod I. Needed equipment set up and supervision for line/lead management. Able to comply with wbing restrictions.      Balance Overall balance assessment: Modified Independent                                         ADL either performed or assessed with clinical judgement   ADL Overall ADL's : At baseline                                       General ADL Comments: Pt reports he is at baseline with ADLs at this time and his daughter lives with him and assists as needed. Simulated commode transfer with MOD I.     Vision         Perception         Praxis         Pertinent Vitals/Pain Pain Assessment Pain Assessment: No/denies pain     Extremity/Trunk Assessment Upper Extremity Assessment Upper Extremity Assessment: Overall WFL for tasks assessed   Lower Extremity Assessment Lower Extremity Assessment: Overall WFL for tasks assessed       Communication Communication Communication: No apparent difficulties   Cognition Arousal: Alert Behavior During Therapy: [facility] for tasks assessed/performed  Following commands: Intact       Cueing  General Comments   Cueing Techniques: Verbal cues      Exercises     Shoulder Instructions      Home Living Family/patient expects to be discharged to:: Private residence Living Arrangements: Children (daughter) Available Help at Discharge: Family;Available 24 hours/day Type of Home: Apartment Home Access: Level entry     Home Layout: One level     Bathroom Shower/Tub: Chief Strategy Officer: Standard     Home Equipment: Agricultural consultant (2 wheels);Cane - single point;Hospital bed;Wheelchair - manual;BSC/3in1   Additional Comments: pt reports he just received hospital bed for home.      Prior Functioning/Environment Prior Level of Function : Needs assist             Mobility  Comments: transfers betwen surfaces and wheelchair ADLs Comments: indep with ADLs and med management. Reports no falls    OT Problem List:     OT Treatment/Interventions:        OT Goals(Current goals can be found in the care plan section)       OT Frequency:       Co-evaluation              AM-PAC OT "6 Clicks" Daily Activity     Outcome Measure Help from another person eating meals?: None Help from another person taking care of personal grooming?: None Help from another person toileting, which includes using toliet, bedpan, or urinal?: A Little Help from another person bathing (including washing, rinsing, drying)?: A Little Help from another person to put on and taking off regular upper body clothing?: None Help from another person to put on and taking off regular lower body clothing?: A Little 6 Click Score: 21   End of Session Equipment Utilized During Treatment:  (wound vac) Nurse Communication: Mobility status  Activity Tolerance: Patient tolerated treatment well Patient left: in bed;with call bell/phone within reach;with bed alarm set  OT Visit Diagnosis: Other abnormalities of gait and mobility (R26.89)                Time: 4098-1191 OT Time Calculation (min): 17 min Charges:  OT General Charges $OT Visit: 1 Visit OT Evaluation $OT Eval Low Complexity: 1 Low Ruther Ephraim, OTR/L 05/18/23, 4:29 PM  [place] Cl[place] E Yonna Alwin 05/18/2023, 4:25 PM

## 2023-05-18 NOTE — Progress Notes (Signed)
 TEE cancelled today due to schedule.  Will plan for TEE at 13:00 on 05/19/2023 with Dr. Mariah Milling. Patient to be made NPO at midnight, sips with medications are ok. Please see consent dated 05/15/2023.

## 2023-05-18 NOTE — TOC Progression Note (Signed)
 Transition of Care [facility]) - Progression Note    Patient Details  Name: Corey Foster MRN: [id number] Date of Birth: 05/08/1955  [facility] CM/SW Contact  Marlowe Sax, RN Phone Number: 05/18/2023, 1:12 PM  Clinical Narrative:    The patient will need IV ABX at dc and STR, Sent out the bed search will review bed offers once obtained   Expected Discharge Plan: Home w Home Health Services Barriers to Discharge: Continued Medical Work up  Expected Discharge Plan and Services   Discharge Planning Services: CM Consult   Living arrangements for the past 2 months: Single Family Home                 DME Arranged: Hospital bed DME Agency: [employer] Date DME Agency Contacted: 05/14/23   Representative spoke with at DME Agency: Cletis [place] HH Arranged: PT, OT [facility] Agency: [facility] Date HH Agency Contacted: 05/14/23 Time HH Agency Contacted: [id number] Representative spoke with at [facility] Agency: Renee Harder   Social Determinants of Health (SDOH) Interventions SDOH Screenings   Food Insecurity: No Food Insecurity (05/12/2023)  Housing: Low Risk  (05/12/2023)  Transportation Needs: No Transportation Needs (05/12/2023)  Utilities: Not At Risk (05/12/2023)  Social Connections: Unknown (05/12/2023)  Tobacco Use: High Risk (05/18/2023)    Readmission Risk Interventions    05/14/2023    3:51 PM  Readmission Risk Prevention Plan  Transportation Screening Complete  PCP or Specialist Appt within 3-5 Days Complete  HRI or Home Care Consult Complete  Social Work Consult for Recovery Care Planning/Counseling Complete  Palliative Care Screening Not Applicable  Medication Review Oceanographer) Referral to Pharmacy

## 2023-05-18 NOTE — Plan of Care (Signed)
  Problem: Coping: Goal: Ability to adjust to condition or change in health will improve Outcome: Progressing   Problem: Fluid Volume: Goal: Ability to maintain a balanced intake and output will improve Outcome: Progressing   Problem: Nutritional: Goal: Maintenance of adequate nutrition will improve Outcome: Progressing   Problem: Education: Goal: Knowledge of General Education information will improve Description: Including pain rating scale, medication(s)/side effects and non-pharmacologic comfort measures Outcome: Progressing   Problem: Health Behavior/Discharge Planning: Goal: Ability to manage health-related needs will improve Outcome: Progressing

## 2023-05-19 ENCOUNTER — Encounter: Payer: Self-pay | Admitting: Certified Registered"

## 2023-05-19 ENCOUNTER — Other Ambulatory Visit: Payer: Self-pay | Admitting: Cardiovascular Disease

## 2023-05-19 ENCOUNTER — Inpatient Hospital Stay: Payer: Self-pay

## 2023-05-19 ENCOUNTER — Encounter
Admission: EM | Disposition: A | Discharge status: Home Health | Payer: Self-pay | Source: Ambulatory Visit | Attending: Student

## 2023-05-19 ENCOUNTER — Inpatient Hospital Stay (HOSPITAL_COMMUNITY)
Admit: 2023-05-19 | Discharge: 2023-05-19 | Disposition: A | Discharge status: Home / Self Care | Attending: Cardiovascular Disease | Admitting: Cardiovascular Disease

## 2023-05-19 DIAGNOSIS — L97415 Non-pressure chronic ulcer of right heel and midfoot with muscle involvement without evidence of necrosis: Secondary | ICD-10-CM | POA: Diagnosis not present

## 2023-05-19 DIAGNOSIS — E1151 Type 2 diabetes mellitus with diabetic peripheral angiopathy without gangrene: Secondary | ICD-10-CM | POA: Diagnosis not present

## 2023-05-19 DIAGNOSIS — I330 Acute and subacute infective endocarditis: Secondary | ICD-10-CM

## 2023-05-19 DIAGNOSIS — R7881 Bacteremia: Secondary | ICD-10-CM | POA: Diagnosis not present

## 2023-05-19 DIAGNOSIS — I340 Nonrheumatic mitral (valve) insufficiency: Secondary | ICD-10-CM

## 2023-05-19 DIAGNOSIS — E11621 Type 2 diabetes mellitus with foot ulcer: Secondary | ICD-10-CM | POA: Diagnosis not present

## 2023-05-19 DIAGNOSIS — B9561 Methicillin susceptible Staphylococcus aureus infection as the cause of diseases classified elsewhere: Secondary | ICD-10-CM | POA: Diagnosis not present

## 2023-05-19 HISTORY — PX: TEE WITHOUT CARDIOVERSION: SHX5443

## 2023-05-19 LAB — CBC
HCT: 43.8 % (ref 39.0–52.0)
Hemoglobin: 14.9 g/dL (ref 13.0–17.0)
MCH: 31 pg (ref 26.0–34.0)
MCHC: 34 g/dL (ref 30.0–36.0)
MCV: 91.3 fL (ref 80.0–100.0)
Platelets: 182 10*3/uL (ref 150–400)
RBC: 4.8 MIL/uL (ref 4.22–5.81)
RDW: 13.5 % (ref 11.5–15.5)
WBC: 5.7 10*3/uL (ref 4.0–10.5)
nRBC: 0 % (ref 0.0–0.2)

## 2023-05-19 LAB — BASIC METABOLIC PANEL
Anion gap: 6 (ref 5–15)
BUN: 33 mg/dL — ABNORMAL HIGH (ref 8–23)
CO2: 30 mmol/L (ref 22–32)
Calcium: 8.6 mg/dL — ABNORMAL LOW (ref 8.9–10.3)
Chloride: 99 mmol/L (ref 98–111)
Creatinine, Ser: 1.28 mg/dL — ABNORMAL HIGH (ref 0.61–1.24)
GFR, Estimated: >60 mL/min (ref >60)
Glucose, Bld: 276 mg/dL — ABNORMAL HIGH (ref 70–99)
Potassium: 4.6 mmol/L (ref 3.5–5.1)
Sodium: 135 mmol/L (ref 135–145)

## 2023-05-19 LAB — GLUCOSE, CAPILLARY
Glucose-Capillary: 241 mg/dL — ABNORMAL HIGH (ref 70–99)
Glucose-Capillary: 157 mg/dL — ABNORMAL HIGH (ref 70–99)
Glucose-Capillary: 136 mg/dL — ABNORMAL HIGH (ref 70–99)
Glucose-Capillary: 121 mg/dL — ABNORMAL HIGH (ref 70–99)

## 2023-05-19 LAB — MAGNESIUM: Magnesium: 2.4 mg/dL (ref 1.7–2.4)

## 2023-05-19 LAB — PHOSPHORUS: Phosphorus: 3.7 mg/dL (ref 2.5–4.6)

## 2023-05-19 SURGERY — ECHOCARDIOGRAM, TRANSESOPHAGEAL
Anesthesia: General

## 2023-05-19 MED ORDER — LIDOCAINE VISCOUS HCL 2 % MT SOLN
OROMUCOSAL | Status: AC
  Filled 2023-05-19: qty 15

## 2023-05-19 MED ORDER — INSULIN ASPART 100 UNIT/ML IJ SOLN
13.0000 [IU] | Freq: Three times a day (TID) | INTRAMUSCULAR | Status: DC
  Administered 2023-05-19 – 2023-05-21 (×5): 13 [IU] via SUBCUTANEOUS
  Filled 2023-05-19 (×5): qty 1

## 2023-05-19 MED ORDER — SODIUM CHLORIDE 0.9% FLUSH
3.0000 mL | INTRAVENOUS | Status: DC | PRN
Start: 2023-05-19 — End: 2023-05-19

## 2023-05-19 MED ORDER — INSULIN GLARGINE-YFGN 100 UNIT/ML ~~LOC~~ SOLN
43.0000 [IU] | Freq: Every day | SUBCUTANEOUS | Status: DC
Start: 2023-05-20 — End: 2023-05-21
  Administered 2023-05-20 – 2023-05-21 (×2): 43 [IU] via SUBCUTANEOUS
  Filled 2023-05-19 (×2): qty 0.43

## 2023-05-19 MED ORDER — SODIUM CHLORIDE 0.9 % IV SOLN: INTRAVENOUS | Status: DC | PRN

## 2023-05-19 MED ORDER — BUTAMBEN-TETRACAINE-BENZOCAINE 2-2-14 % EX AERO
INHALATION_SPRAY | CUTANEOUS | Status: AC
  Filled 2023-05-19: qty 5

## 2023-05-19 MED ORDER — PROPOFOL 10 MG/ML IV BOLUS
INTRAVENOUS | Status: DC | PRN
  Administered 2023-05-19 (×2): 10 mg via INTRAVENOUS
  Administered 2023-05-19: 40 mg via INTRAVENOUS
  Administered 2023-05-19: 20 mg via INTRAVENOUS
  Administered 2023-05-19: 10 mg via INTRAVENOUS
  Administered 2023-05-19 (×2): 20 mg via INTRAVENOUS
  Administered 2023-05-19 (×4): 10 mg via INTRAVENOUS

## 2023-05-19 MED ORDER — SODIUM CHLORIDE 0.9% FLUSH
3.0000 mL | Freq: Two times a day (BID) | INTRAVENOUS | Status: DC
Start: 2023-05-19 — End: 2023-05-19

## 2023-05-19 NOTE — Anesthesia Postprocedure Evaluation (Signed)
 Anesthesia Post Note  Patient: Corey Foster  Procedure(s) Performed: ECHOCARDIOGRAM, TRANSESOPHAGEAL  Patient location during evaluation: PACU Anesthesia Type: General Level of consciousness: awake and alert, oriented and patient cooperative Pain management: pain level controlled Vital Signs Assessment: post-procedure vital signs reviewed and stable Respiratory status: spontaneous breathing, nonlabored ventilation and respiratory function stable Cardiovascular status: blood pressure returned to baseline and stable Postop Assessment: adequate PO intake Anesthetic complications: no   No notable events documented.   Last Vitals:  Vitals:   05/19/23 1331 05/19/23 1332  BP:    Pulse: 80 85  Resp: (!) 21 (!) 21  Temp:    SpO2: 95% 95%    Last Pain:  Vitals:   05/19/23 1247  TempSrc: Oral  PainSc: 0-No pain                 Reed Breech

## 2023-05-19 NOTE — Progress Notes (Signed)
*  PRELIMINARY RESULTS* Echocardiogram Echocardiogram Transesophageal has been performed.  Cristela Blue 05/19/2023, 1:44 PM

## 2023-05-19 NOTE — Transfer of Care (Signed)
 Immediate Anesthesia Transfer of Care Note  Patient: Corey Foster  Procedure(s) Performed: ECHOCARDIOGRAM, TRANSESOPHAGEAL  Patient Location: Nursing Unit  Anesthesia Type:General  Level of Consciousness: awake, alert , and oriented  Airway & Oxygen Therapy: Patient Spontanous Breathing and Patient connected to nasal cannula oxygen  Post-op Assessment: Report given to RN and Post -op Vital signs reviewed and stable  Post vital signs: Reviewed and stable  Last Vitals:  Vitals Value Taken Time  BP 99/61   Temp    Pulse 80   Resp 16   SpO2 97     Last Pain:  Vitals:   05/19/23 1247  TempSrc: Oral  PainSc: 0-No pain         Complications: No notable events documented.

## 2023-05-19 NOTE — Progress Notes (Signed)
 Transesophageal Echocardiogram :  Indication: Bacteremia, rule out endocarditis Requesting/ordering  physician: Dr. Lucianne Muss  Procedure: Benzocaine spray x2 and 2 mls x 2 of viscous lidocaine were given orally to provide local anesthesia to the oropharynx. The patient was positioned supine on the left side, bite block provided. The patient was moderately sedated with the doses of versed and fentanyl as detailed below.  Using digital technique an omniplane probe was advanced into the distal esophagus without incident.   Moderate sedation: 1. Sedation used: Sedation per anesthesia  See report in EPIC  for complete details: In brief,  No valve vegetation transgastric imaging revealed normal LV function with no RWMAs and no mural apical thrombus.  .  Estimated ejection fraction was 55%.  Right sided cardiac chambers were normal with no evidence of pulmonary hypertension.  Imaging of the septum showed no ASD or VSD Bubble study was positive for small PFO, bubble study positive 2D and color flow confirmed small PFO  The LA was well visualized in orthogonal views.  There was no spontaneous contrast and no thrombus in the LA and LA appendage   The descending thoracic aorta had no  mural aortic debris with no evidence of aneurysmal dilation or dissection, there is mild diffuse aortic atherosclerosis arch and descending aorta   Corey Foster 05/19/2023 1:46 PM

## 2023-05-19 NOTE — Inpatient Diabetes Management (Signed)
 Inpatient Diabetes Program Recommendations  AACE/ADA: New Consensus Statement on Inpatient Glycemic Control (2015)  Target Ranges:  Prepandial:   less than 140 mg/dL      Peak postprandial:   less than 180 mg/dL (1-2 hours)      Critically ill patients:  140 - 180 mg/dL   Lab Results  Component Value Date   GLUCAP 241 (H) 05/19/2023   HGBA1C >15.5 (H) 11/27/2022    Latest Reference Range & Units 05/17/23 07:29 05/17/23 11:23 05/17/23 17:41 05/17/23 19:53 05/18/23 08:04 05/18/23 12:10 05/18/23 16:08 05/19/23 08:41  Glucose-Capillary 70 - 99 mg/dL 086 (H) 578 (H) 469 (H) 269 (H) 197 (H) 144 (H) 306 (H) 241 (H)  (H): Data is abnormally high  Diabetes history: DM2 Outpatient Diabetes medications: Toujeo 80 units QAM, Toujeo 120 units at bedtime, Novolog 40 units TID, Jardiance 25 mg daily  Current orders for Inpatient glycemic control: Lantus 40 units daily, Novolog 8 units TID with meals, Novolog 0-15 units AC&HS   Inpatient Diabetes Program Recommendations:     Please consider: -Increasing Lantus to 43 units daily -Increase Novolog to 13 units TID with meals if eats 50%.  Thank you, Billy Fischer. Dov Dill, RN, MSN, CDCES  Diabetes Coordinator Inpatient Glycemic Control Team Team Pager [phone] (8am-5pm) 05/19/2023 10:16 AM

## 2023-05-19 NOTE — Progress Notes (Addendum)
 Progress Note   Patient: Corey Foster:[id number] DOB: 07/18/1955 DOA: 05/12/2023     7 DOS: the patient was seen and examined on 05/19/2023   Brief hospital course:  Corey Foster is a 68 y.o. male with medical history significant for DM,  diabetic neuropathy, PVD s/p left BKA, sent by podiatry for evaluation of nonhealing ulcer right heel in spite of outpatient antibiotics and wound care.  He reports pain in the foot but denies fever or chills. ED course and data review: Vitals within normal limits Labs with normal WBC and lactic acid, normal creatinine, mostly unremarkable. X-ray of the foot showing soft tissue ulcer plantar aspect of heel without evidence of osteomyelitis Patient started on clindamycin Hospitalist consulted for admission   Assessment and Plan:  Diabetic foot ulcer right heel with infection (HCC) Cellulitis right lower extremity Was initially on Zosyn and vancomycin which was discontinued on 3/19, currently on cefazolin and metronidazole as per ID Pain control Vascular consulted, s/p RLE angiogram: normal common femoral artery, profunda femoris artery, superficial femoral and popliteal arteries.  There was a high takeoff of the posterior tibial artery but this was large and continuous into the foot without focal stenosis.  The peroneal artery was also fairly large and continued down to the ankle with good collateral flow into the foot.  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  Podiatry consulted, s/p excisional subcutaneous debridement of ulcer done on 3/19, order placed for dressing and wound VAC as per podiatry and follow wound culture. Wound culture yields moderate group B strep agalactiae    # Staph Aureus bacteremia due to right heel ulcer 3/18 Blood culture growing Staph aureus MSSA 3/19 started cefazolin and metronidazole as per ID Follow up results of TTE to r/o Endocarditis Appreciate ID input and she recommended repeat blood cultures after  48 hours of antibiotic therapy.  Repeat cultures show no growth after 12 hours Discussed with ID and she recommends a TEE. Consult placed for TEE with plans for it to be done on Tuesday 03/25 Patient will likely need IV Abx on discharge per ID, he also has a wound vac and is NWB on his right leg. Will likely need SNF for IV abx. TOC consult placed. TEE scheduled today      PVD (peripheral vascular disease) History of left BKA Continue aspirin and simvastatin    Diabetes mellitus due to underlying condition with diabetic neuropathy and hyperglycemia Increased basal insulin Semglee 43 units subcu daily Increase NovoLog 14 units 3 times daily with meals Continue NovoLog sliding scale, monitor CBG Continue diabetic diet    Chronic degenerative disc disease, chronic lower backache Continue as needed medication for pain control   Vitamin D level 31, borderline low started oral supplement. Vitamin B12 level 364, goal >400, started oral supplement to prevent B12 deficiency.    Obesity (BMI 33.75 kg/m2) Obstructive sleep apnea Complicates overall prognosis and care Continue CPAP at bedtime   Subjective: No significant events overnight, c/o chronic backache 8/10, right heel pain is very little, he feels it is there, no any other active issues.  Patient is aware that he is scheduled for TEE and then we will plan for disposition tomorrow a.m.   Physical Exam: Vitals:   05/18/23 2058 05/19/23 0412 05/19/23 0839 05/19/23 1247  BP: 138/79 (!) 145/88 123/67 117/63  Pulse: 84 72 74 78  Resp: 19 18 16 15   Temp: (!) 97.4 F (36.3 C) 98.2 F (36.8 C) 97.9 F (36.6 C)  98.3 F (36.8 C)  TempSrc:    Oral  SpO2: 96% 95% 96% 95%  Weight:      Height:       General: NAD, lying comfortably Appear in no distress, affect appropriate Eyes: PERRLA ENT: Oral Mucosa Clear, moist  Neck: no JVD,  Cardiovascular: S1 and S2 Present, no Murmur,  Respiratory: good respiratory effort, Bilateral Air  entry equal and Decreased, no Crackles, no wheezes Abdomen: Bowel Sound present, Soft and no tenderness,  Skin: no rashes Extremities: no Pedal edema, no calf tenderness, s/p debridement of right heel ulcer, wound VAC attached.  S/p Left AKA (chronic) Neurologic: without any new focal findings    Data Reviewed:  Labs reviewed  Family Communication: Plan of care discussed with patient in detail.  He verbalizes understanding and agrees with the plan.  Disposition: Status is: Inpatient Remains inpatient appropriate because: Plan of care was discussed with patient in detail.  He verbalizes understanding and agrees with the plan.  Planned Discharge Destination: Home vs SNF    Time spent: 35  minutes  Author: Gillis Santa, MD 05/19/2023 1:29 PM  For on call review [email or web address].

## 2023-05-19 NOTE — Progress Notes (Signed)
 Date of Admission:  05/12/2023    ID: Corey Foster is a 68 y.o. male  Principal Problem:   Diabetic ulcer of right heel (HCC) Active Problems:   Diabetes mellitus due to underlying condition with diabetic neuropathy (HCC)   Hx of BKA, left (HCC)   Diabetic foot ulcer right heel with infection (HCC)   PVD (peripheral vascular disease) (HCC)   MSSA bacteremia   Bacteremia   Bacterial endocarditis    Subjective: Doing well No specific issues He had TEE Novegetation He prefers to get PO at home  Medications:   [MAR Hold] vitamin C  500 mg Oral BID   [MAR Hold] aspirin EC  325 mg Oral Daily   [MAR Hold] vitamin B-12  500 mcg Oral Daily   [MAR Hold] enoxaparin (LOVENOX) injection  55 mg Subcutaneous QPM   [MAR Hold] insulin aspart  0-15 Units Subcutaneous TID WC & HS   [MAR Hold] insulin aspart  13 Units Subcutaneous TID WC   [MAR Hold] insulin glargine-yfgn  43 Units Subcutaneous Daily   [MAR Hold] metroNIDAZOLE  500 mg Oral Q12H   [MAR Hold] multivitamin with minerals  1 tablet Oral Daily   [MAR Hold] nutrition supplement (JUVEN)  1 packet Oral BID BM   [MAR Hold] pregabalin  75 mg Oral QPM   [MAR Hold] simvastatin  40 mg Oral QPM   sodium chloride flush  3-10 mL Intravenous Q12H   [MAR Hold] traZODone  25 mg Oral QHS   [MAR Hold] traZODone  25 mg Oral Once   [MAR Hold] Vitamin D (Ergocalciferol)  50,000 Units Oral Q7 days   [MAR Hold] zinc sulfate (50mg  elemental zinc)  220 mg Oral Daily    Objective: Vital signs in last 24 hours: Patient Vitals for the past 24 hrs:  BP Temp Temp src Pulse Resp SpO2  05/19/23 1415 119/66 -- -- 82 20 95 %  05/19/23 1345 104/63 -- -- 83 20 96 %  05/19/23 1332 -- -- -- 85 (!) 21 95 %  05/19/23 1331 -- -- -- 80 (!) 21 95 %  05/19/23 1330 106/70 -- -- 78 19 93 %  05/19/23 1329 -- -- -- 80 (!) 26 92 %  05/19/23 1328 -- -- -- 79 (!) 23 93 %  05/19/23 1327 120/72 -- -- 78 (!) 21 93 %  05/19/23 1326 -- -- -- 82 (!) 21 95 %  05/19/23  1325 -- -- -- 82 (!) 22 93 %  05/19/23 1324 129/85 -- -- 87 (!) 29 95 %  05/19/23 1323 -- -- -- 85 15 97 %  05/19/23 1322 -- -- -- 85 18 98 %  05/19/23 1321 105/81 -- -- 85 18 98 %  05/19/23 1320 -- -- -- 79 20 96 %  05/19/23 1319 -- -- -- 80 20 95 %  05/19/23 1318 124/69 -- -- 82 17 97 %  05/19/23 1317 113/74 -- -- 83 14 98 %  05/19/23 1316 -- -- -- 81 15 97 %  05/19/23 1315 (!) 85/75 -- -- -- 19 --  05/19/23 1314 -- -- -- 85 19 98 %  05/19/23 1313 -- -- -- 82 18 97 %  05/19/23 1312 -- -- -- 75 (!) 21 97 %  05/19/23 1311 -- -- -- 76 18 97 %  05/19/23 1310 -- -- -- 77 16 96 %  05/19/23 1309 -- -- -- 76 17 95 %  05/19/23 1308 126/72 -- -- 76 15 96 %  05/19/23 1247 117/63 98.3 F (36.8 C) Oral 78 15 95 %  05/19/23 0839 123/67 97.9 F (36.6 C) -- 74 16 96 %  05/19/23 0412 (!) 145/88 98.2 F (36.8 C) -- 72 18 95 %  05/18/23 2058 138/79 (!) 97.4 F (36.3 C) -- 84 19 96 %  05/18/23 1526 130/79 97.9 F (36.6 C) -- 86 18 96 %       PHYSICAL EXAM:  General: Alert, cooperative, no distress, appears stated age.  Head: Normocephalic, without obvious abnormality, atraumatic. Eyes: Conjunctivae clear, anicteric sclerae. Pupils are equal ENT Nares normal. No drainage or sinus tenderness. Lips, mucosa, and tongue normal. No Thrush Neck: Supple, symmetrical, no adenopathy, thyroid: non tender no carotid bruit and no JVD. Back: No CVA tenderness. Lungs: Clear to auscultation bilaterally. No Wheezing or Rhonchi. No rales. Heart: Regular rate and rhythm, no murmur, rub or gallop. Abdomen: Soft, non-tender,not distended. Bowel sounds normal. No masses Extremities: rt heel ulcer- has wound vac Left BKA Skin: No rashes or lesions. Or bruising Lymph: Cervical, supraclavicular normal. Neurologic: Grossly non-focal  Lab Results    Latest Ref Rng & Units 05/19/2023    4:42 AM 05/18/2023    5:19 AM 05/17/2023    2:15 AM  CBC  WBC 4.0 - 10.5 K/uL 5.7  5.6  6.1   Hemoglobin 13.0 - 17.0 g/dL  40.9  81.1  91.4   Hematocrit 39.0 - 52.0 % 43.8  46.2  45.5   Platelets 150 - 400 K/uL 182  173  181        Latest Ref Rng & Units 05/19/2023    4:42 AM 05/18/2023    5:19 AM 05/17/2023    2:15 AM  CMP  Glucose 70 - 99 mg/dL 782  956  213   BUN 8 - 23 mg/dL 33  37  37   Creatinine 0.61 - 1.24 mg/dL 0.86  5.78  4.69   Sodium 135 - 145 mmol/L 135  135  132   Potassium 3.5 - 5.1 mmol/L 4.6  5.2  4.8   Chloride 98 - 111 mmol/L 99  100  100   CO2 22 - 32 mmol/L 30  26  25    Calcium 8.9 - 10.3 mg/dL 8.6  8.6  8.3       Microbiology: MSSA bactermia Studies/Results: No results found.    Assessment/Plan: ?Diabetes mellulits with peripheral neuropathy with ulcer rt heel with infection   Staph aureus bacteremia 1 of 2 culture- time to positivity around 18 hours  Source rt foot ulcer  2 d echo no vegetation TEE no endocarditis 9 PFO+) repeat blood culture NG Pt will need IV antibiotics on discharge-  But he has no one relaible to give it every day- He prefers PO- will give him a dose of Iv dalbavancin weekly x 2  and send him on PO cefadroil   DM on insulin   PAD Left BKA     Current smoker   Secondary erythrocytosis    Discussed the management with the patient and hospitalist

## 2023-05-19 NOTE — Plan of Care (Signed)

## 2023-05-20 ENCOUNTER — Encounter: Payer: Self-pay | Admitting: Cardiovascular Disease

## 2023-05-20 ENCOUNTER — Other Ambulatory Visit (HOSPITAL_COMMUNITY): Payer: Self-pay

## 2023-05-20 DIAGNOSIS — R7881 Bacteremia: Secondary | ICD-10-CM | POA: Diagnosis not present

## 2023-05-20 DIAGNOSIS — E11621 Type 2 diabetes mellitus with foot ulcer: Secondary | ICD-10-CM | POA: Diagnosis not present

## 2023-05-20 DIAGNOSIS — B9561 Methicillin susceptible Staphylococcus aureus infection as the cause of diseases classified elsewhere: Secondary | ICD-10-CM | POA: Diagnosis not present

## 2023-05-20 DIAGNOSIS — E1151 Type 2 diabetes mellitus with diabetic peripheral angiopathy without gangrene: Secondary | ICD-10-CM | POA: Diagnosis not present

## 2023-05-20 DIAGNOSIS — L97415 Non-pressure chronic ulcer of right heel and midfoot with muscle involvement without evidence of necrosis: Secondary | ICD-10-CM | POA: Diagnosis not present

## 2023-05-20 LAB — BASIC METABOLIC PANEL
Anion gap: 6 (ref 5–15)
BUN: 21 mg/dL (ref 8–23)
CO2: 26 mmol/L (ref 22–32)
Calcium: 8.6 mg/dL — ABNORMAL LOW (ref 8.9–10.3)
Chloride: 103 mmol/L (ref 98–111)
Creatinine, Ser: 1.01 mg/dL (ref 0.61–1.24)
GFR, Estimated: >60 mL/min (ref >60)
Glucose, Bld: 122 mg/dL — ABNORMAL HIGH (ref 70–99)
Potassium: 4.6 mmol/L (ref 3.5–5.1)
Sodium: 135 mmol/L (ref 135–145)

## 2023-05-20 LAB — CBC
HCT: 45.7 % (ref 39.0–52.0)
Hemoglobin: 15.4 g/dL (ref 13.0–17.0)
MCH: 30.7 pg (ref 26.0–34.0)
MCHC: 33.7 g/dL (ref 30.0–36.0)
MCV: 91.2 fL (ref 80.0–100.0)
Platelets: 188 10*3/uL (ref 150–400)
RBC: 5.01 MIL/uL (ref 4.22–5.81)
RDW: 13.4 % (ref 11.5–15.5)
WBC: 5.2 10*3/uL (ref 4.0–10.5)
nRBC: 0 % (ref 0.0–0.2)

## 2023-05-20 LAB — GLUCOSE, CAPILLARY
Glucose-Capillary: 118 mg/dL — ABNORMAL HIGH (ref 70–99)
Glucose-Capillary: 134 mg/dL — ABNORMAL HIGH (ref 70–99)
Glucose-Capillary: 161 mg/dL — ABNORMAL HIGH (ref 70–99)
Glucose-Capillary: 113 mg/dL — ABNORMAL HIGH (ref 70–99)

## 2023-05-20 LAB — MAGNESIUM: Magnesium: 2.1 mg/dL (ref 1.7–2.4)

## 2023-05-20 LAB — PHOSPHORUS: Phosphorus: 3.1 mg/dL (ref 2.5–4.6)

## 2023-05-20 LAB — ECHO TEE

## 2023-05-20 MED ORDER — BISACODYL 10 MG RE SUPP: 10.0000 mg | Freq: Every day | RECTAL | Status: DC | PRN

## 2023-05-20 MED ORDER — POLYETHYLENE GLYCOL 3350 17 G PO PACK
17.0000 g | PACK | Freq: Two times a day (BID) | ORAL | Status: DC
  Filled 2023-05-20 (×2): qty 1

## 2023-05-20 MED ORDER — BISACODYL 5 MG PO TBEC
10.0000 mg | DELAYED_RELEASE_TABLET | Freq: Once | ORAL | Status: AC
  Administered 2023-05-20: 10 mg via ORAL
  Filled 2023-05-20: qty 2

## 2023-05-20 MED ORDER — BISACODYL 5 MG PO TBEC
10.0000 mg | DELAYED_RELEASE_TABLET | Freq: Every day | ORAL | Status: DC
  Filled 2023-05-20: qty 2

## 2023-05-20 NOTE — TOC Progression Note (Addendum)
 Transition of Care [facility]) - Progression Note    Patient Details  Name: Corey Foster MRN: [id number] Date of Birth: 06/21/1955  Transition of Care [facility] CM/SW Contact  Margarito Liner, LCSW Phone Number: 05/20/2023, 11:13 AM  Clinical Narrative:  Patient does not want SNF placement. He prefers to return home with home health through [facility]. ID working out antibiotic plan with [facility]. Patient confirmed the hospital bed has been delivered to his home and family will transport him home at discharge.   2:27 pm: Patient will require home wound vac. [facility] liaison has been notified. CSW ordered home vac through [place].  Expected Discharge Plan: Home w Home Health Services Barriers to Discharge: Continued Medical Work up  Expected Discharge Plan and Services   Discharge Planning Services: CM Consult   Living arrangements for the past 2 months: Single Family Home                 DME Arranged: Hospital bed DME Agency: [facility] Date DME Agency Contacted: 05/14/23   Representative spoke with at DME Agency: Cletis [place] HH Arranged: PT, OT [facility] Agency: [facility] Date HH Agency Contacted: 05/14/23 Time HH Agency Contacted: [phone] Representative spoke with at [facility] Agency: Renee Harder   Social Determinants of Health (SDOH) Interventions SDOH Screenings   Food Insecurity: No Food Insecurity (05/12/2023)  Housing: Low Risk  (05/12/2023)  Transportation Needs: No Transportation Needs (05/12/2023)  Utilities: Not At Risk (05/12/2023)  Social Connections: Unknown (05/12/2023)  Tobacco Use: High Risk (05/18/2023)    Readmission Risk Interventions    05/14/2023    3:51 PM  Readmission Risk Prevention Plan  Transportation Screening Complete  PCP or Specialist Appt within 3-5 Days Complete  HRI or Home Care Consult Complete  Social Work Consult for Recovery Care Planning/Counseling Complete  Palliative Care Screening Not Applicable  Medication Review Oceanographer) Referral to  Pharmacy

## 2023-05-20 NOTE — TOC Benefit Eligibility Note (Signed)
 Pharmacy Patient Advocate Encounter  Insurance verification completed.   The patient is insured through [employer] Part D  Ran test claim for Dalvance. Currently a quantity of 1500 is a 1 day supply and the co-pay is $0.00 ** . **This is a transition fill. Medication is non-formulary and would require a prior authorization  This test claim was processed through [facility]- copay amounts may vary at other pharmacies due to pharmacy/plan contracts, or as the patient moves through the different stages of their insurance plan.

## 2023-05-20 NOTE — Progress Notes (Signed)
 Nutrition Follow-up  DOCUMENTATION CODES:   Obesity unspecified  INTERVENTION:   -Liberalize to carb modified diet -Continue MVI with minerals daily -Continue 500 mg vitamin C BID -Continue 220 mg zinc sulfate daily x 14 days -Continue double protein portions with meals -Continue 1 packet Juven BID, each packet provides 95 calories, 2.5 grams of protein (collagen), and 9.8 grams of carbohydrate (3 grams sugar); also contains 7 grams of L-arginine and L-glutamine, 300 mg vitamin C, 15 mg vitamin E, 1.2 mcg vitamin B-12, 9.5 mg zinc, 200 mg calcium, and 1.5 g  Calcium Beta-hydroxy-Beta-methylbutyrate to support wound healing   NUTRITION DIAGNOSIS:   Increased nutrient needs related to wound healing as evidenced by estimated needs.  Ongoing  GOAL:   Patient will meet greater than or equal to 90% of their needs  Progressing   MONITOR:   PO intake, Supplement acceptance  REASON FOR ASSESSMENT:   Consult Assessment of nutrition requirement/status, Wound healing  ASSESSMENT:   Pt with medical history significant for DM,  diabetic neuropathy, PVD s/p left BKA, sent by podiatry for evaluation of nonhealing ulcer right heel in spite of outpatient antibiotics and wound care  3/19- s/p  Ultrasound guidance for vascular access left femoral artery; Catheter placement into right SFA from left femoral approach; Aortogram and selective right lower extremity angiogram; StarClose closure device left femoral artery, s/p bedside debridement by podiatry  3/20- wound vac placed by [facility] 3/25- s/p TEE- no vegetations noted  Reviewed I/O's: -2.3 L x 24 hours and -10.5 L since admission  UOP: 3.1 L x 24 hours  Per podiatry notes, MRI and x-ray reveals no evidence of osteomyelitis; signing off.   Pt remains with good meal intake on heart healthy/ carb modified diet. Noted meal completions 75-100%.   Per TOC notes, pt refusing SNF placement. Plan for home health with wound vac and IV  antibiotics.   Wt has been stable since admission.   Medications reviewed and include vitamin C, vitamin B-12, miralax, vitamin D, and zinc sulfate.   Labs reviewed: CBGS: 118-241 (inpatient orders for glycemic control are 0-15 units insulin aspart TYID with meals and at bedtime, 13 units insulin aspart TID with meals, and 43 units insulin glargine-yfgn daily).    Diet Order:   Diet Order             Diet heart healthy/carb modified Room service appropriate? Yes; Fluid consistency: Thin  Diet effective now                   EDUCATION NEEDS:   No education needs have been identified at this time  Skin:  Skin Assessment: Skin Integrity Issues: Skin Integrity Issues:: Diabetic Ulcer Diabetic Ulcer: rt heel  Last BM:  05/13/23 (type 4)  Height:   Ht Readings from Last 1 Encounters:  05/18/23 5\' 11"  (1.803 m)    Weight:   Wt Readings from Last 1 Encounters:  05/18/23 109.8 kg    Ideal Body Weight:  73.1 kg (adjusted for lt BKA)  BMI:  Body mass index is 33.76 kg/m.  Estimated Nutritional Needs:   Kcal:  1900-2100  Protein:  115-130 grams  Fluid:  1.9-2.1 L    Levada Schilling, RD, LDN, CDCES Registered Dietitian III Certified Diabetes Care and Education Specialist If unable to reach this RD, please use "RD Inpatient" group chat on secure chat between hours of 8am-4 pm daily

## 2023-05-20 NOTE — Plan of Care (Signed)
  Problem: Fluid Volume: Goal: Ability to maintain a balanced intake and output will improve Outcome: Progressing   Problem: Health Behavior/Discharge Planning: Goal: Ability to manage health-related needs will improve Outcome: Progressing   Problem: Nutritional: Goal: Maintenance of adequate nutrition will improve Outcome: Progressing   Problem: Activity: Goal: Risk for activity intolerance will decrease Outcome: Progressing   Problem: Pain Managment: Goal: General experience of comfort will improve and/or be controlled Outcome: Progressing

## 2023-05-20 NOTE — Progress Notes (Signed)
 Noted no BM since 3/23. Provider notified, bowel regimen requested. See new orders.

## 2023-05-20 NOTE — Progress Notes (Addendum)
 Progress Note   Patient: Corey Foster:[id number] DOB: Oct 02, 1955 DOA: 05/12/2023     8 DOS: the patient was seen and examined on 05/20/2023   Brief hospital course:  Corey Foster is a 68 y.o. male with medical history significant for DM,  diabetic neuropathy, PVD s/p left BKA, sent by podiatry for evaluation of nonhealing ulcer right heel in spite of outpatient antibiotics and wound care.  He reports pain in the foot but denies fever or chills. ED course and data review: Vitals within normal limits Labs with normal WBC and lactic acid, normal creatinine, mostly unremarkable. X-ray of the foot showing soft tissue ulcer plantar aspect of heel without evidence of osteomyelitis Patient started on clindamycin Hospitalist consulted for admission   Assessment and Plan:  # Diabetic foot ulcer right heel with infection (HCC) # Cellulitis right lower extremity Was initially on Zosyn and vancomycin which was discontinued on 3/19, currently on cefazolin and metronidazole as per ID Pain control Vascular consulted, s/p RLE angiogram: normal common femoral artery, profunda femoris artery, superficial femoral and popliteal arteries.  There was a high takeoff of the posterior tibial artery but this was large and continuous into the foot without focal stenosis.  The peroneal artery was also fairly large and continued down to the ankle with good collateral flow into the foot.  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  Podiatry consulted, s/p excisional subcutaneous debridement of ulcer done on 3/19, order placed for dressing and wound VAC as per podiatry and follow wound culture. Wound culture yields moderate group B strep agalactiae    # Staph Aureus bacteremia due to right heel ulcer 3/18 Blood culture growing Staph aureus MSSA 3/19 started cefazolin and metronidazole as per ID Follow up results of TTE to r/o Endocarditis Appreciate ID input and she recommended repeat blood cultures  after 48 hours of antibiotic therapy.  Repeat cultures show no growth after 12 hours Discussed with ID and she recommends a TEE which was done on 3/25.  TEE negative for endocarditis, positive PFO.  ID recommended dalbavancin 1 dose tomorrow and [place] at the dose as an outpatient. Wound VAC will be changed tomorrow and wound VAC will be arranged by TOC for home use  # PVD (peripheral vascular disease) # History of left BKA Continue aspirin and simvastatin    # Diabetes mellitus due to underlying condition with diabetic neuropathy and hyperglycemia Increased basal insulin Semglee 43 units subcu daily Increase NovoLog 14 units 3 times daily with meals Continue NovoLog sliding scale, monitor CBG Continue diabetic diet    # Chronic degenerative disc disease, chronic lower backache Continue as needed medication for pain control   # Vitamin D level 31, borderline low started oral supplement. # Vitamin B12 level 364, goal >400, started oral supplement to prevent B12 deficiency.    # Obesity (BMI 33.75 kg/m2) # Obstructive sleep apnea Complicates overall prognosis and care Continue CPAP at bedtime   Subjective: No significant events overnight, patient was sitting on the wheelchair, he used restroom, still has significant pain in the back which is chronic, he always has it, no new complaints. Patient was advised that he will be getting antibiotics before discharge and he will need a wound VAC change.  Most likely plan will be to discharge him home tomorrow a.m.   Physical Exam: Vitals:   05/19/23 1955 05/20/23 0502 05/20/23 0803 05/20/23 1524  BP: 118/68 135/76 121/72 (!) 152/75  Pulse: 73 74 75 80  Resp: 18  17 18 19   Temp: 98.6 F (37 C) 98.9 F (37.2 C) 97.7 F (36.5 C) 98 F (36.7 C)  TempSrc:    Oral  SpO2: 100% 100% 98% 99%  Weight:      Height:       General: NAD, lying comfortably Appear in no distress, affect appropriate Eyes: PERRLA ENT: Oral Mucosa Clear, moist   Neck: no JVD,  Cardiovascular: S1 and S2 Present, no Murmur,  Respiratory: good respiratory effort, Bilateral Air entry equal and Decreased, no Crackles, no wheezes Abdomen: Bowel Sound present, Soft and no tenderness,  Skin: no rashes Extremities: no Pedal edema, no calf tenderness, s/p debridement of right heel ulcer, wound VAC attached.  S/p Left AKA (chronic) Neurologic: without any new focal findings    Data Reviewed:  Labs reviewed  Family Communication: Plan of care discussed with patient in detail.  He verbalizes understanding and agrees with the plan.  Disposition: Status is: Inpatient Remains inpatient appropriate because: Plan of care was discussed with patient in detail.  He verbalizes understanding and agrees with the plan.  Planned Discharge Destination: Home with [facility] DC plan tomorrow a.m. on 05/21/2023   Time spent: 35  minutes  Author: Gillis Santa, MD 05/20/2023 3:33 PM  For on call review [email or web address].

## 2023-05-20 NOTE — Progress Notes (Signed)
 Date of Admission:  05/12/2023    ID: Corey Foster is a 68 y.o. male  Principal Problem:   Diabetic ulcer of right heel (HCC) Active Problems:   Diabetes mellitus due to underlying condition with diabetic neuropathy (HCC)   Hx of BKA, left (HCC)   Diabetic foot ulcer right heel with infection (HCC)   PVD (peripheral vascular disease) (HCC)   MSSA bacteremia   Bacteremia   Bacterial endocarditis    Subjective: Doing well No specific issues He had TEE Novegetation He prefers to get PO at home  Medications:   vitamin C  500 mg Oral BID   aspirin EC  325 mg Oral Daily   bisacodyl  10 mg Oral QHS   vitamin B-12  500 mcg Oral Daily   enoxaparin (LOVENOX) injection  55 mg Subcutaneous QPM   insulin aspart  0-15 Units Subcutaneous TID WC & HS   insulin aspart  13 Units Subcutaneous TID WC   insulin glargine-yfgn  43 Units Subcutaneous Daily   metroNIDAZOLE  500 mg Oral Q12H   multivitamin with minerals  1 tablet Oral Daily   nutrition supplement (JUVEN)  1 packet Oral BID BM   polyethylene glycol  17 g Oral BID   pregabalin  75 mg Oral QPM   simvastatin  40 mg Oral QPM   traZODone  25 mg Oral QHS   traZODone  25 mg Oral Once   Vitamin D (Ergocalciferol)  50,000 Units Oral Q7 days   zinc sulfate (50mg  elemental zinc)  220 mg Oral Daily    Objective: Vital signs in last 24 hours: Patient Vitals for the past 24 hrs:  BP Temp Temp src Pulse Resp SpO2  05/20/23 1524 (!) 152/75 98 F (36.7 C) Oral 80 19 99 %  05/20/23 0803 121/72 97.7 F (36.5 C) -- 75 18 98 %  05/20/23 0502 135/76 98.9 F (37.2 C) -- 74 17 100 %  05/19/23 1954 118/68 98.6 F (37 C) -- 73 18 100 %       PHYSICAL EXAM:  General: Alert, cooperative, no distress, appears stated age.  Head: Normocephalic, without obvious abnormality, atraumatic. Eyes: Conjunctivae clear, anicteric sclerae. Pupils are equal ENT Nares normal. No drainage or sinus tenderness. Lips, mucosa, and tongue normal. No  Thrush Neck: Supple, symmetrical, no adenopathy, thyroid: non tender no carotid bruit and no JVD. Back: No CVA tenderness. Lungs: Clear to auscultation bilaterally. No Wheezing or Rhonchi. No rales. Heart: Regular rate and rhythm, no murmur, rub or gallop. Abdomen: Soft, non-tender,not distended. Bowel sounds normal. No masses Extremities: rt heel ulcer- has wound vac Left BKA Skin: No rashes or lesions. Or bruising Lymph: Cervical, supraclavicular normal. Neurologic: Grossly non-focal  Lab Results    Latest Ref Rng & Units 05/20/2023    5:05 AM 05/19/2023    4:42 AM 05/18/2023    5:19 AM  CBC  WBC 4.0 - 10.5 K/uL 5.2  5.7  5.6   Hemoglobin 13.0 - 17.0 g/dL 69.6  29.5  28.4   Hematocrit 39.0 - 52.0 % 45.7  43.8  46.2   Platelets 150 - 400 K/uL 188  182  173        Latest Ref Rng & Units 05/20/2023    5:05 AM 05/19/2023    4:42 AM 05/18/2023    5:19 AM  CMP  Glucose 70 - 99 mg/dL 132  440  102   BUN 8 - 23 mg/dL 21  33  37  Creatinine 0.61 - 1.24 mg/dL 1.61  0.96  0.45   Sodium 135 - 145 mmol/L 135  135  135   Potassium 3.5 - 5.1 mmol/L 4.6  4.6  5.2   Chloride 98 - 111 mmol/L 103  99  100   CO2 22 - 32 mmol/L 26  30  26    Calcium 8.9 - 10.3 mg/dL 8.6  8.6  8.6       Microbiology: MSSA bactermia Studies/Results: ECHO TEE Result Date: 05/20/2023    TRANSESOPHOGEAL ECHO REPORT   Patient Name:   Corey Foster Date of Exam: 05/19/2023 Medical Rec #:  [id number]     Height:       71.0 in Accession #:    [id number]    Weight:       242.1 lb Date of Birth:  08-Jul-1955    BSA:          2.287 m Patient Age:    67 years      BP:           106/70 mmHg Patient Gender: M             HR:           85 bpm. Exam Location:  [facility] Procedure: Transesophageal Echo, Cardiac Doppler, Color Doppler, Saline Contrast            Bubble Study and 3D Echo (Both Spectral and Color Flow Doppler were            utilized during procedure). Indications:     Bacteremia  History:         Patient has prior history  of Echocardiogram examinations, most                  recent 05/15/2023. Risk Factors:Diabetes, Hypertension and Sleep                  Apnea.  Sonographer:     Cristela Blue Referring Phys:  [id number] Antonieta Iba Diagnosing Phys: Julien Nordmann MD PROCEDURE: After discussion of the risks and benefits of a TEE, an informed consent was obtained from the patient. TEE procedure time was 30 minutes. The transesophogeal probe was passed without difficulty through the esophogus of the patient. Imaged were obtained with the patient in a left lateral decubitus position. Local oropharyngeal anesthetic was provided with viscous lidocaine and Cetacaine. Sedation performed by different physician. The patient was monitored while under deep sedation. The patient's vital signs; including heart rate, blood pressure, and oxygen saturation; remained stable throughout the procedure. The patient developed no complications during the procedure.  IMPRESSIONS  1. Left ventricular ejection fraction, by estimation, is 60 to 65%. The left ventricle has normal function. The left ventricle has no regional wall motion abnormalities.  2. Right ventricular systolic function is normal. The right ventricular size is normal.  3. No left atrial/left atrial appendage thrombus was detected.  4. The mitral valve is normal in structure. Mild mitral valve regurgitation. No evidence of mitral stenosis.  5. The aortic valve is tricuspid. Aortic valve regurgitation is not visualized. No aortic stenosis is present.  6. There is mild (Grade II) atheroma plaque involving the aortic arch and descending aorta.  7. The inferior vena cava is normal in size with greater than 50% respiratory variability, suggesting right atrial pressure of 3 mmHg.  8. Agitated saline contrast bubble study was positive with shunting observed within 3-6 cardiac cycles suggestive of interatrial shunt. There is a small  patent foramen ovale.  9. 3D performed of the mitral valve and  demonstrates normal valve. Conclusion(s)/Recommendation(s): Normal biventricular function without evidence of hemodynamically significant valvular heart disease. positive for PFO, no vegetation noted FINDINGS  Left Ventricle: Left ventricular ejection fraction, by estimation, is 60 to 65%. The left ventricle has normal function. The left ventricle has no regional wall motion abnormalities. The left ventricular internal cavity size was normal in size. There is  no left ventricular hypertrophy. Right Ventricle: The right ventricular size is normal. No increase in right ventricular wall thickness. Right ventricular systolic function is normal. Left Atrium: Left atrial size was normal in size. No left atrial/left atrial appendage thrombus was detected. Right Atrium: Right atrial size was normal in size. Pericardium: There is no evidence of pericardial effusion. Mitral Valve: The mitral valve is normal in structure. Mild mitral valve regurgitation. No evidence of mitral valve stenosis. There is no evidence of mitral valve vegetation. Tricuspid Valve: The tricuspid valve is normal in structure. Tricuspid valve regurgitation is not demonstrated. No evidence of tricuspid stenosis. There is no evidence of tricuspid valve vegetation. Aortic Valve: The aortic valve is tricuspid. Aortic valve regurgitation is not visualized. No aortic stenosis is present. There is no evidence of aortic valve vegetation. Pulmonic Valve: The pulmonic valve was normal in structure. Pulmonic valve regurgitation is not visualized. No evidence of pulmonic stenosis. There is no evidence of pulmonic valve vegetation. Aorta: The aortic root is normal in size and structure. There is mild (Grade II) atheroma plaque involving the aortic arch and descending aorta. Venous: The inferior vena cava is normal in size with greater than 50% respiratory variability, suggesting right atrial pressure of 3 mmHg. IAS/Shunts: No atrial level shunt detected by color flow  Doppler. Agitated saline contrast was given intravenously to evaluate for intracardiac shunting. Agitated saline contrast bubble study was positive with shunting observed within 3-6 cardiac cycles suggestive of interatrial shunt. A small patent foramen ovale is detected. Additional Comments: 3D was performed not requiring image post processing on an independent workstation and was normal. Julien Nordmann MD Electronically signed by Julien Nordmann MD Signature Date/Time: 05/20/2023/11:53:33 AM    Final       Assessment/Plan: ?Diabetes mellulits with peripheral neuropathy with ulcer rt heel with infection   Staph aureus bacteremia 1 of 2 culture- time to positivity around 18 hours  Source rt foot ulcer  2 d echo no vegetation TEE no endocarditis 9 PFO+) repeat blood culture NG Pt will need IV antibiotics on discharge-  But he has no one reliable to give it every day- He prefers PO- will give him a dose of Iv dalbavancin weekly x 2  and send him on PO cefadroil   DM on insulin   PAD Left BKA     Current smoker   Secondary erythrocytosis    Discussed the management with the patient and hospitalist

## 2023-05-21 ENCOUNTER — Other Ambulatory Visit: Payer: Self-pay

## 2023-05-21 ENCOUNTER — Encounter: Payer: Self-pay | Admitting: Internal Medicine

## 2023-05-21 DIAGNOSIS — E11621 Type 2 diabetes mellitus with foot ulcer: Secondary | ICD-10-CM | POA: Diagnosis not present

## 2023-05-21 DIAGNOSIS — L97415 Non-pressure chronic ulcer of right heel and midfoot with muscle involvement without evidence of necrosis: Secondary | ICD-10-CM | POA: Diagnosis not present

## 2023-05-21 LAB — CBC
HCT: 44.3 % (ref 39.0–52.0)
Hemoglobin: 15.3 g/dL (ref 13.0–17.0)
MCH: 31 pg (ref 26.0–34.0)
MCHC: 34.5 g/dL (ref 30.0–36.0)
MCV: 89.9 fL (ref 80.0–100.0)
Platelets: 178 10*3/uL (ref 150–400)
RBC: 4.93 MIL/uL (ref 4.22–5.81)
RDW: 13.2 % (ref 11.5–15.5)
WBC: 6 10*3/uL (ref 4.0–10.5)
nRBC: 0 % (ref 0.0–0.2)

## 2023-05-21 LAB — BASIC METABOLIC PANEL WITH GFR
Anion gap: 7 (ref 5–15)
BUN: 22 mg/dL (ref 8–23)
CO2: 26 mmol/L (ref 22–32)
Calcium: 8.3 mg/dL — ABNORMAL LOW (ref 8.9–10.3)
Chloride: 99 mmol/L (ref 98–111)
Creatinine, Ser: 1.09 mg/dL (ref 0.61–1.24)
GFR, Estimated: >60 mL/min (ref >60)
Glucose, Bld: 243 mg/dL — ABNORMAL HIGH (ref 70–99)
Potassium: 4.4 mmol/L (ref 3.5–5.1)
Sodium: 132 mmol/L — ABNORMAL LOW (ref 135–145)

## 2023-05-21 LAB — GLUCOSE, CAPILLARY: Glucose-Capillary: 264 mg/dL — ABNORMAL HIGH (ref 70–99)

## 2023-05-21 MED ORDER — ZINC SULFATE 220 (50 ZN) MG PO TABS
220.0000 mg | ORAL_TABLET | Freq: Every day | ORAL | 0 refills | Status: AC
  Filled 2023-05-21: qty 7, 7d supply, fill #0

## 2023-05-21 MED ORDER — CYANOCOBALAMIN 500 MCG PO TABS
500.0000 ug | ORAL_TABLET | Freq: Every day | ORAL | 2 refills | Status: AC
Start: 2023-05-22 — End: 2023-08-20
  Filled 2023-05-21: qty 30, 30d supply, fill #0

## 2023-05-21 MED ORDER — VITAMIN D (ERGOCALCIFEROL) 1.25 MG (50000 UNIT) PO CAPS
50000.0000 [IU] | ORAL_CAPSULE | ORAL | 0 refills | Status: AC
Start: 2023-05-27 — End: 2023-08-25
  Filled 2023-05-21: qty 12, 84d supply, fill #0

## 2023-05-21 MED ORDER — DEXTROSE 5 % IV SOLN: 1500.0000 mg | Freq: Once | INTRAVENOUS | 0 refills | Status: AC

## 2023-05-21 MED ORDER — CEFADROXIL 500 MG PO CAPS
1000.0000 mg | ORAL_CAPSULE | Freq: Two times a day (BID) | ORAL | 0 refills | Status: AC
  Filled 2023-05-21: qty 28, 7d supply, fill #0

## 2023-05-21 MED ORDER — DEXTROSE 5 % IV SOLN
1500.0000 mg | Freq: Once | INTRAVENOUS | Status: AC
  Administered 2023-05-21: 1500 mg via INTRAVENOUS
  Filled 2023-05-21: qty 75

## 2023-05-21 MED ORDER — ACETAMINOPHEN 325 MG PO TABS
650.0000 mg | ORAL_TABLET | Freq: Four times a day (QID) | ORAL | Status: AC | PRN
Start: 2023-05-21 — End: ?

## 2023-05-21 MED ORDER — SIMVASTATIN 40 MG PO TABS
40.0000 mg | ORAL_TABLET | Freq: Every day | ORAL | 0 refills | Status: AC
  Filled 2023-05-21: qty 30, 30d supply, fill #0

## 2023-05-21 NOTE — Plan of Care (Signed)
 Problem: Education: Goal: Ability to describe self-care measures that may prevent or decrease complications (Diabetes Survival Skills Education) will improve Outcome: Progressing   Problem: Coping: Goal: Ability to adjust to condition or change in health will improve Outcome: Progressing   Problem: Fluid Volume: Goal: Ability to maintain a balanced intake and output will improve Outcome: Progressing   Problem: Health Behavior/Discharge Planning: Goal: Ability to identify and utilize available resources and services will improve Outcome: Progressing Goal: Ability to manage health-related needs will improve Outcome: Progressing   Problem: Metabolic: Goal: Ability to maintain appropriate glucose levels will improve Outcome: Progressing   Problem: Nutritional: Goal: Maintenance of adequate nutrition will improve Outcome: Progressing Goal: Progress toward achieving an optimal weight will improve Outcome: Progressing   Problem: Skin Integrity: Goal: Risk for impaired skin integrity will decrease Outcome: Progressing   Problem: Tissue Perfusion: Goal: Adequacy of tissue perfusion will improve Outcome: Progressing   Problem: Education: Goal: Knowledge of General Education information will improve Description: Including pain rating scale, medication(s)/side effects and non-pharmacologic comfort measures Outcome: Progressing   Problem: Health Behavior/Discharge Planning: Goal: Ability to manage health-related needs will improve Outcome: Progressing   Problem: Clinical Measurements: Goal: Ability to maintain clinical measurements within normal limits will improve Outcome: Progressing Goal: Will remain free from infection Outcome: Progressing Goal: Diagnostic test results will improve Outcome: Progressing Goal: Respiratory complications will improve Outcome: Progressing Goal: Cardiovascular complication will be avoided Outcome: Progressing   Problem: Activity: Goal:  Risk for activity intolerance will decrease Outcome: Progressing   Problem: Nutrition: Goal: Adequate nutrition will be maintained Outcome: Progressing   Problem: Coping: Goal: Level of anxiety will decrease Outcome: Progressing   Problem: Elimination: Goal: Will not experience complications related to bowel motility Outcome: Progressing Goal: Will not experience complications related to urinary retention Outcome: Progressing   Problem: Pain Managment: Goal: General experience of comfort will improve and/or be controlled Outcome: Progressing   Problem: Safety: Goal: Ability to remain free from injury will improve Outcome: Progressing   Problem: Skin Integrity: Goal: Risk for impaired skin integrity will decrease Outcome: Progressing   Problem: Education: Goal: Ability to describe self-care measures that may prevent or decrease complications (Diabetes Survival Skills Education) will improve 05/21/2023 1017 by Emilio Aspen, RN Outcome: Adequate for Discharge 05/21/2023 1017 by Emilio Aspen, RN Outcome: Progressing   Problem: Coping: Goal: Ability to adjust to condition or change in health will improve 05/21/2023 1017 by Emilio Aspen, RN Outcome: Adequate for Discharge 05/21/2023 1017 by Emilio Aspen, RN Outcome: Progressing   Problem: Fluid Volume: Goal: Ability to maintain a balanced intake and output will improve 05/21/2023 1017 by Emilio Aspen, RN Outcome: Adequate for Discharge 05/21/2023 1017 by Emilio Aspen, RN Outcome: Progressing   Problem: Health Behavior/Discharge Planning: Goal: Ability to identify and utilize available resources and services will improve 05/21/2023 1017 by Emilio Aspen, RN Outcome: Adequate for Discharge 05/21/2023 1017 by Emilio Aspen, RN Outcome: Progressing Goal: Ability to manage health-related needs will improve 05/21/2023 1017 by Emilio Aspen, RN Outcome: Adequate for Discharge 05/21/2023 1017 by  Emilio Aspen, RN Outcome: Progressing   Problem: Metabolic: Goal: Ability to maintain appropriate glucose levels will improve 05/21/2023 1017 by Emilio Aspen, RN Outcome: Adequate for Discharge 05/21/2023 1017 by Emilio Aspen, RN Outcome: Progressing   Problem: Nutritional: Goal: Maintenance of adequate nutrition will improve 05/21/2023 1017 by Emilio Aspen, RN Outcome: Adequate for Discharge 05/21/2023 1017 by Emilio Aspen, RN  Outcome: Progressing Goal: Progress toward achieving an optimal weight will improve 05/21/2023 1017 by Emilio Aspen, RN Outcome: Adequate for Discharge 05/21/2023 1017 by Emilio Aspen, RN Outcome: Progressing   Problem: Skin Integrity: Goal: Risk for impaired skin integrity will decrease 05/21/2023 1017 by Emilio Aspen, RN Outcome: Adequate for Discharge 05/21/2023 1017 by Emilio Aspen, RN Outcome: Progressing   Problem: Tissue Perfusion: Goal: Adequacy of tissue perfusion will improve 05/21/2023 1017 by Emilio Aspen, RN Outcome: Adequate for Discharge 05/21/2023 1017 by Emilio Aspen, RN Outcome: Progressing   Problem: Education: Goal: Knowledge of General Education information will improve Description: Including pain rating scale, medication(s)/side effects and non-pharmacologic comfort measures 05/21/2023 1017 by Emilio Aspen, RN Outcome: Adequate for Discharge 05/21/2023 1017 by Emilio Aspen, RN Outcome: Progressing   Problem: Health Behavior/Discharge Planning: Goal: Ability to manage health-related needs will improve 05/21/2023 1017 by Emilio Aspen, RN Outcome: Adequate for Discharge 05/21/2023 1017 by Emilio Aspen, RN Outcome: Progressing   Problem: Clinical Measurements: Goal: Ability to maintain clinical measurements within normal limits will improve 05/21/2023 1017 by Emilio Aspen, RN Outcome: Adequate for Discharge 05/21/2023 1017 by Emilio Aspen, RN Outcome:  Progressing Goal: Will remain free from infection 05/21/2023 1017 by Emilio Aspen, RN Outcome: Adequate for Discharge 05/21/2023 1017 by Emilio Aspen, RN Outcome: Progressing Goal: Diagnostic test results will improve 05/21/2023 1017 by Emilio Aspen, RN Outcome: Adequate for Discharge 05/21/2023 1017 by Emilio Aspen, RN Outcome: Progressing Goal: Respiratory complications will improve 05/21/2023 1017 by Emilio Aspen, RN Outcome: Adequate for Discharge 05/21/2023 1017 by Emilio Aspen, RN Outcome: Progressing Goal: Cardiovascular complication will be avoided 05/21/2023 1017 by Emilio Aspen, RN Outcome: Adequate for Discharge 05/21/2023 1017 by Emilio Aspen, RN Outcome: Progressing   Problem: Activity: Goal: Risk for activity intolerance will decrease 05/21/2023 1017 by Emilio Aspen, RN Outcome: Adequate for Discharge 05/21/2023 1017 by Emilio Aspen, RN Outcome: Progressing   Problem: Nutrition: Goal: Adequate nutrition will be maintained 05/21/2023 1017 by Emilio Aspen, RN Outcome: Adequate for Discharge 05/21/2023 1017 by Emilio Aspen, RN Outcome: Progressing   Problem: Coping: Goal: Level of anxiety will decrease 05/21/2023 1017 by Emilio Aspen, RN Outcome: Adequate for Discharge 05/21/2023 1017 by Emilio Aspen, RN Outcome: Progressing   Problem: Elimination: Goal: Will not experience complications related to bowel motility 05/21/2023 1017 by Emilio Aspen, RN Outcome: Adequate for Discharge 05/21/2023 1017 by Emilio Aspen, RN Outcome: Progressing Goal: Will not experience complications related to urinary retention 05/21/2023 1017 by Emilio Aspen, RN Outcome: Adequate for Discharge 05/21/2023 1017 by Emilio Aspen, RN Outcome: Progressing   Problem: Pain Managment: Goal: General experience of comfort will improve and/or be controlled 05/21/2023 1017 by Emilio Aspen, RN Outcome: Adequate for  Discharge 05/21/2023 1017 by Emilio Aspen, RN Outcome: Progressing   Problem: Safety: Goal: Ability to remain free from injury will improve 05/21/2023 1017 by Emilio Aspen, RN Outcome: Adequate for Discharge 05/21/2023 1017 by Emilio Aspen, RN Outcome: Progressing   Problem: Skin Integrity: Goal: Risk for impaired skin integrity will decrease 05/21/2023 1017 by Emilio Aspen, RN Outcome: Adequate for Discharge 05/21/2023 1017 by Emilio Aspen, RN Outcome: Progressing

## 2023-05-21 NOTE — Consult Note (Signed)
 WOC Nurse wound follow up Patient is medicated for pain prior to dressing change.   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  Hx left BKA.   Measurement: 3 cm x 2 cm x 0.4 cm  there is an abrasion to right medial malleolus, circular, dark and intact.  This could have been medical adhesive related from drape.  Area is cleansed and protected with skin prep.   Wound bed: ruddy red  photo in chart Drainage (amount, consistency, odor) minimal serosanguinous  musty odor to foot Periwound: callous present to periwound Dressing procedure/placement/frequency: cleanse wound with NS.  Apply 1 piece black foam to wound bed.  Bridge of black foam to bridge to dorsal foot (over protected intact skin).   This will aide in offloading pressure from [facility] pad. COvered with drape and TRAC pad. Seal achieved at 125 MMhg.  Gauze placed beneath tubing to minimize pressure.  Change Mon/Thurs  Home unit to be delivered to room today, per patient and then he anticipates discharge.  Informed that bedside RN can just transition current tubing to home unit without disrupting the dressing.   Encouarge patient to consume a balanced diet once home, including protein and hydration.  Maintain blood sugar within acceptable range. Discussed minimizing weight bearing to the wound for optimal wound healing.  Due to left leg BKA, patient will do his best.  He has a daughter that lives with him and intends to help him.   Will follow through discharge.   Mike Gip MSN, RN, FNP-BC [facility]ON Wound, Ostomy, Continence Nurse Outpatient [facility] [phone] Pager [phone]

## 2023-05-21 NOTE — Discharge Summary (Signed)
 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   Specialty: Vascular Surgery Why: U/S Right Lower Extremity with ABI Contact information: [address] [place] [address] [phone]         Emogene Morgan, MD Follow up in 1 week(s).   Specialty: Family Medicine Contact information: [address] [place] [address] [phone]         Rosetta Posner, DPM Follow up in 1 week(s).   Specialty: Podiatry Contact information: [address] [place] [address] [phone]                Diet recommendation: Cardiac and Carb modified diet  Activity: The patient is advised to gradually reintroduce usual activities, as tolerated  Discharge Condition: stable  Code Status: Full code   History of present illness: As per the H and P dictated on admission  Hospital Course:  Corey Foster is a 68 y.o. male with medical history significant for DM,  diabetic neuropathy, PVD s/p left BKA, sent by podiatry for evaluation of nonhealing ulcer right heel in spite of outpatient antibiotics and wound care.  He reports pain in the  foot but denies fever or chills. ED course and data review: Vitals within normal limits Labs with normal WBC and lactic acid, normal creatinine, mostly unremarkable. X-ray of the foot showing soft tissue ulcer plantar aspect of heel without evidence of osteomyelitis Patient started on clindamycin Hospitalist consulted for admission     Assessment and Plan:   # Diabetic foot ulcer right heel with infection # Cellulitis right lower extremity Was initially on Zosyn and vancomycin which was discontinued on 3/19, currently on cefazolin and metronidazole as per ID Pain control Vascular consulted, s/p RLE angiogram: normal common femoral artery, profunda femoris artery, superficial femoral and popliteal arteries.  There was a high takeoff of the posterior tibial artery but this was large and continuous into the foot without focal stenosis.  The peroneal artery was also fairly large and continued down to the ankle with good collateral flow into the foot.  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  Podiatry consulted, s/p excisional subcutaneous debridement of ulcer done on 3/19, order placed for dressing and wound VAC as per podiatry and follow wound culture. Wound culture yields moderate group B strep agalactiae     # Staph Aureus bacteremia due to right heel ulcer 3/18 Blood culture growing Staph aureus MSSA 3/19 started cefazolin and metronidazole as per ID Follow up results of TTE to r/o Endocarditis Appreciate ID input and she recommended repeat blood cultures after 48  hours of antibiotic therapy.  Repeat cultures show no growth after 12 hours Discussed with ID and she recommends a TEE which was done on 3/25.  TEE negative for endocarditis, positive PFO.  ID recommended dalbavancin 1 dose today and one dose after one week as an outpatient. Wound VAC was changed today and wound VAC was arranged by TOC for home use   # PVD (peripheral vascular disease) # History of left  BKA Continue aspirin and simvastatin     # Diabetes mellitus due to underlying condition with diabetic neuropathy and hyperglycemia S/p Semglee 43 units subcu daily and NovoLog 14 units 3 times daily with meals and NovoLog sliding scale.  Resumed home dose insulin on discharge, patient was advised to monitor CBG at home and Continue diabetic diet   # Chronic degenerative disc disease, chronic lower backache Continue as needed medication for pain control   # Vitamin D level 31, borderline low started oral supplement. # Vitamin B12 level 364, goal >400, started oral supplement to prevent B12 deficiency.  # Obstructive sleep apnea: Complicates overall prognosis and care. Continue CPAP at bedtime  # Obesity class I  Body mass index is 33.76 kg/m.  Nutrition Problem: Increased nutrient needs Etiology: wound healing Nutrition Interventions: Interventions: MVI, Juven, Premier Protein   supply was provided. - Patient was instructed, not to drive, operate heavy machinery, perform activities at heights, swimming or participation in water activities or provide baby sitting services while on Pain, Sleep and Anxiety Medications; until his outpatient Physician has advised to do so again.  - Also recommended to not to take more than prescribed Pain, Sleep and Anxiety Medications.  Patient was seen by physical therapy, who recommended Home health, which was arranged. On the day of the discharge the patient's vitals were stable, and no other acute medical condition were reported by patient. the patient was felt safe to be discharge at Home with Home health.  Consultants: Podiatry, vascular surgery, ID Procedures: s/p angiogram and right heel debridement  Discharge Exam: General: Appear in no distress, no Rash; Oral Mucosa Clear, moist. Cardiovascular: S1 and S2 Present, no Murmur, Respiratory: normal respiratory effort, Bilateral Air entry present and no Crackles, no wheezes Abdomen: Bowel Sound  present, Soft and no tenderness, no hernia Extremities: s/p left BKA, s/p right heel ulcer debridement and wound VAC attached. no Pedal edema, no calf tenderness Neurology: alert and oriented to time, place, and person affect appropriate.  Filed Weights   05/11/23 1710 05/18/23 1243  Weight: 109.8 kg 109.8 kg   Vitals:   05/21/23 0543 05/21/23 0822  BP: 131/82 (!) 151/78  Pulse: 77 86  Resp: 16   Temp: 98.5 F (36.9 C)   SpO2: 95% 100%    DISCHARGE MEDICATION: Allergies as of 05/21/2023   No Known Allergies      Medication List     STOP taking these medications    cephALEXin 500 MG capsule Commonly known as: KEFLEX   cholecalciferol 10 MCG (400 UNIT) Tabs tablet Commonly known as: VITAMIN D3   furosemide 20 MG tablet Commonly known as: LASIX   Testosterone 20.25 MG/ACT (1.62%) Gel       TAKE these medications    acetaminophen 325 MG tablet Commonly known as: TYLENOL Take 2 tablets (650 mg total) by mouth every 6 (six) hours as needed for mild pain (pain score 1-3), moderate pain (pain score 4-6), fever or headache (or Fever >/= 101).   ALPRAZolam 1 MG tablet Commonly known as: Prudy Feeler  Take 1 tablet (1 mg total) by mouth 3 (three) times daily as needed for anxiety.   AMBULATORY NON FORMULARY MEDICATION Trimix (30/1/10)-(Pap/Phent/PGE)  Dosage: Inject 0.3 cc and may increase 0.1cc to achieve and erection lasting no longer than 1 hour per injection   Vial 1ml  Qty #5 refills 2  [facility] [phone] Fax [phone]   aspirin EC 325 MG tablet Take 325 mg by mouth daily.   baclofen 10 MG tablet Commonly known as: LIORESAL Take 10 mg by mouth 2 (two) times daily.   bisacodyl 5 MG EC tablet Commonly known as: DULCOLAX Take 2 tablets (10 mg total) by mouth at bedtime.   bisacodyl 10 MG suppository Commonly known as: DULCOLAX Place 1 suppository (10 mg total) rectally daily as needed for severe constipation.   cefadroxil 500 MG  capsule Commonly known as: DURICEF Take 2 capsules (1,000 mg total) by mouth 2 (two) times daily for 7 days.   cyanocobalamin 500 MCG tablet Commonly known as: VITAMIN B12 Take 1 tablet (500 mcg total) by mouth daily. Start taking on: May 22, 2023   dalbavancin 1,500 mg in dextrose 5 % 500 mL Inject 1,500 mg into the vein once for 1 dose. Indication:  MSSA Heel ulcer and MSSA bacteremia  First Dose: Yes Last Day of Therapy:  one time dose on 05/28/2023 Labs - Once on 05/28/23:  CBC/D and CMP, Method of administration: IVPB Method of administration may be changed at the discretion of home infusion pharmacist based upon assessment of the patient and/or caregiver's ability to self-administer the medication ordered. Start taking on: May 28, 2023   FreeStyle Libre 2 Sensor Misc   Jardiance 25 MG Tabs tablet Generic drug: empagliflozin Take 25 mg by mouth daily.   KETO PO Take 1 capsule by mouth.   naloxone 0.4 MG/ML injection Commonly known as: NARCAN Inject as directed as directed   NovoLOG FlexPen 100 UNIT/ML FlexPen Generic drug: insulin aspart Inject 40 Units into the skin 3 (three) times daily with meals.   Oxycodone HCl 20 MG Tabs Take 1 tablet by mouth 4 (four) times daily as needed.   polyethylene glycol 17 g packet Commonly known as: MIRALAX / GLYCOLAX Take 17 g by mouth 2 (two) times daily.   pregabalin 75 MG capsule Commonly known as: LYRICA Take 1 capsule (75 mg total) by mouth every evening.   simvastatin 40 MG tablet Commonly known as: ZOCOR Take 1 tablet (40 mg total) by mouth daily.   Toujeo Max SoloStar 300 UNIT/ML Solostar Pen Generic drug: insulin glargine (2 Unit Dial) Inject 80-120 Units into the skin in the morning and at bedtime. Inject 80 Units into the skin daily. 80 units in the am and 120 units at bedtime   vitamin C 250 MG tablet Commonly known as: ASCORBIC ACID Take 250 mg by mouth daily.   Vitamin D (Ergocalciferol) 1.25 MG (50000  UNIT) Caps capsule Commonly known as: DRISDOL Take 1 capsule (50,000 Units total) by mouth every 7 (seven) days. Start taking on: May 27, 2023   zinc sulfate (50mg  elemental zinc) 220 (50 Zn) MG capsule Take 1 capsule (220 mg total) by mouth daily for 7 days. Start taking on: May 22, 2023               Durable Medical Equipment  (From admission, onward)           Start     Ordered   05/14/23 1554  For home use only  DME Hospital bed  Once       Question Answer Comment  Length of Need Lifetime   Bed type Semi-electric      05/14/23 1553              Discharge Care Instructions  (From admission, onward)           Start     Ordered   05/21/23 0000  Discharge wound care:       Comments: As above   05/21/23 1030           No Known Allergies Discharge Instructions     AMB Referral VBCI Care Management   Complete by: As directed    DM2, Hx of BKA, A1C >15.5%   Expected date of contact: Emergent - 3 Days   Service: Pharmacy   Pharmacy Service For:  Disease Management Medication Adherence     Disease states to manage: Diabetes   [facility] pharmacist to adjust dose for Vancomycin, Aminoglycosides and other anti-infective therapies as requested by physician.   Complete by: As directed    Anaphylaxis Kit: Provided to treat any anaphylactic reaction to the medication being provided to the patient if First Dose or when requested by physician   Complete by: As directed    Epinephrine 1mg /ml vial / amp: Administer 0.3mg  (0.44ml) subcutaneously once for moderate to severe anaphylaxis, nurse to call physician and pharmacy when reaction occurs and call 911 if needed for immediate care   Diphenhydramine 50mg /ml IV vial: Administer 25-50mg  IV/IM PRN for first dose reaction, rash, itching, mild reaction, nurse to call physician and pharmacy when reaction occurs   Sodium Chloride 0.9% NS IV: Administer if needed for hypovolemic blood pressure  drop or as ordered by physician after call to physician with anaphylactic reaction   Call MD for:  difficulty breathing, headache or visual disturbances   Complete by: As directed    Call MD for:  extreme fatigue   Complete by: As directed    Call MD for:  persistant dizziness or light-headedness   Complete by: As directed    Call MD for:  redness, tenderness, or signs of infection (pain, swelling, redness, odor or green/yellow discharge around incision site)   Complete by: As directed    Call MD for:  severe uncontrolled pain   Complete by: As directed    Call MD for:  temperature >100.4   Complete by: As directed    Diet - low sodium heart healthy   Complete by: As directed    Discharge instructions   Complete by: As directed    Follow-up with PCP in 1 week Follow-up with podiatry in 1 to 2 weeks Follow-up with vascular surgery in 1 to 2 weeks Follow-up with ID in 1 week   Discharge wound care:   Complete by: As directed    As above   Flush IV access with Sodium Chloride 0.9% and Heparin 10 units/ml or 100 units/ml   Complete by: As directed    Increase activity slowly   Complete by: As directed    Method of administration may be changed at the discretion of home infusion pharmacist based upon assessment of the patient and/or caregiver's ability to self-administer the medication ordered   Complete by: As directed        The results of significant diagnostics from this hospitalization (including imaging, microbiology, ancillary and laboratory) are listed below for reference.    Significant Diagnostic Studies: ECHO TEE Result Date:  05/20/2023    TRANSESOPHOGEAL ECHO REPORT   Patient Name:   Corey Foster Date of Exam: 05/19/2023 Medical Rec #:  [id number]     Height:       71.0 in Accession #:    [id number]    Weight:       242.1 lb Date of Birth:  [birth year]    BSA:          2.287 m Patient Age:    67 years      BP:           106/70 mmHg Patient Gender: M             HR:            85 bpm. Exam Location:  [facility] Procedure: Transesophageal Echo, Cardiac Doppler, Color Doppler, Saline Contrast            Bubble Study and 3D Echo (Both Spectral and Color Flow Doppler were            utilized during procedure). Indications:     Bacteremia  History:         Patient has prior history of Echocardiogram examinations, most                  recent 05/15/2023. Risk Factors:Diabetes, Hypertension and Sleep                  Apnea.  Sonographer:     Cristela Blue Referring Phys:  [id number] Antonieta Iba Diagnosing Phys: Julien Nordmann MD PROCEDURE: After discussion of the risks and benefits of a TEE, an informed consent was obtained from the patient. TEE procedure time was 30 minutes. The transesophogeal probe was passed without difficulty through the esophogus of the patient. Imaged were obtained with the patient in a left lateral decubitus position. Local oropharyngeal anesthetic was provided with viscous lidocaine and Cetacaine. Sedation performed by different physician. The patient was monitored while under deep sedation. The patient's vital signs; including heart rate, blood pressure, and oxygen saturation; remained stable throughout the procedure. The patient developed no complications during the procedure.  IMPRESSIONS  1. Left ventricular ejection fraction, by estimation, is 60 to 65%. The left ventricle has normal function. The left ventricle has no regional wall motion abnormalities.  2. Right ventricular systolic function is normal. The right ventricular size is normal.  3. No left atrial/left atrial appendage thrombus was detected.  4. The mitral valve is normal in structure. Mild mitral valve regurgitation. No evidence of mitral stenosis.  5. The aortic valve is tricuspid. Aortic valve regurgitation is not visualized. No aortic stenosis is present.  6. There is mild (Grade II) atheroma plaque involving the aortic arch and descending aorta.  7. The inferior vena cava is normal in size with greater  than 50% respiratory variability, suggesting right atrial pressure of 3 mmHg.  8. Agitated saline contrast bubble study was positive with shunting observed within 3-6 cardiac cycles suggestive of interatrial shunt. There is a small patent foramen ovale.  9. 3D performed of the mitral valve and demonstrates normal valve. Conclusion(s)/Recommendation(s): Normal biventricular function without evidence of hemodynamically significant valvular heart disease. positive for PFO, no vegetation noted FINDINGS  Left Ventricle: Left ventricular ejection fraction, by estimation, is 60 to 65%. The left ventricle has normal function. The left ventricle has no regional wall motion abnormalities. The left ventricular internal cavity size was normal in size. There is  no left ventricular hypertrophy. Right Ventricle: The right  ventricular size is normal. No increase in right ventricular wall thickness. Right ventricular systolic function is normal. Left Atrium: Left atrial size was normal in size. No left atrial/left atrial appendage thrombus was detected. Right Atrium: Right atrial size was normal in size. Pericardium: There is no evidence of pericardial effusion. Mitral Valve: The mitral valve is normal in structure. Mild mitral valve regurgitation. No evidence of mitral valve stenosis. There is no evidence of mitral valve vegetation. Tricuspid Valve: The tricuspid valve is normal in structure. Tricuspid valve regurgitation is not demonstrated. No evidence of tricuspid stenosis. There is no evidence of tricuspid valve vegetation. Aortic Valve: The aortic valve is tricuspid. Aortic valve regurgitation is not visualized. No aortic stenosis is present. There is no evidence of aortic valve vegetation. Pulmonic Valve: The pulmonic valve was normal in structure. Pulmonic valve regurgitation is not visualized. No evidence of pulmonic stenosis. There is no evidence of pulmonic valve vegetation. Aorta: The aortic root is normal in size and  structure. There is mild (Grade II) atheroma plaque involving the aortic arch and descending aorta. Venous: The inferior vena cava is normal in size with greater than 50% respiratory variability, suggesting right atrial pressure of 3 mmHg. IAS/Shunts: No atrial level shunt detected by color flow Doppler. Agitated saline contrast was given intravenously to evaluate for intracardiac shunting. Agitated saline contrast bubble study was positive with shunting observed within 3-6 cardiac cycles suggestive of interatrial shunt. A small patent foramen ovale is detected. Additional Comments: 3D was performed not requiring image post processing on an independent workstation and was normal. Julien Nordmann MD Electronically signed by Julien Nordmann MD Signature Date/Time: 05/20/2023/11:53:33 AM    Final    ECHOCARDIOGRAM COMPLETE Result Date: 05/15/2023    ECHOCARDIOGRAM REPORT   Patient Name:   Corey Foster Date of Exam: 05/15/2023 Medical Rec #:  [id number]     Height:       71.0 in Accession #:    [id number]    Weight:       242.0 lb Date of Birth:  03-05-1955    BSA:          2.286 m Patient Age:    67 years      BP:           126/81 mmHg Patient Gender: M             HR:           81 bpm. Exam Location:  [facility] Procedure: 2D Echo, Cardiac Doppler and Color Doppler (Both Spectral and Color            Flow Doppler were utilized during procedure). Indications:     Bacteremia  History:         Patient has no prior history of Echocardiogram examinations.                  Signs/Symptoms:Bacteremia; Risk Factors:Hypertension, Diabetes                  and Dyslipidemia.  Sonographer:     Mikki Harbor Referring Phys:  [id number] Lynn Ito Diagnosing Phys: Debbe Odea MD  Sonographer Comments: Technically difficult study due to poor echo windows and patient is obese. IMPRESSIONS  1. Left ventricular ejection fraction, by estimation, is 55 to 60%. The left ventricle has normal function. The left ventricle has no  regional wall motion abnormalities. There is mild left ventricular hypertrophy. Left ventricular diastolic parameters are consistent with Grade I diastolic dysfunction (impaired  relaxation).  2. Right ventricular systolic function is normal. The right ventricular size is normal.  3. The mitral valve is normal in structure. No evidence of mitral valve regurgitation.  4. The aortic valve is tricuspid. Aortic valve regurgitation is not visualized.  5. The inferior vena cava is normal in size with greater than 50% respiratory variability, suggesting right atrial pressure of 3 mmHg. FINDINGS  Left Ventricle: Left ventricular ejection fraction, by estimation, is 55 to 60%. The left ventricle has normal function. The left ventricle has no regional wall motion abnormalities. Strain was performed and the global longitudinal strain is indeterminate. The left ventricular internal cavity size was normal in size. There is mild left ventricular hypertrophy. Left ventricular diastolic parameters are consistent with Grade I diastolic dysfunction (impaired relaxation). Right Ventricle: The right ventricular size is normal. No increase in right ventricular wall thickness. Right ventricular systolic function is normal. Left Atrium: Left atrial size was normal in size. Right Atrium: Right atrial size was normal in size. Pericardium: There is no evidence of pericardial effusion. Mitral Valve: The mitral valve is normal in structure. No evidence of mitral valve regurgitation. MV peak gradient, 3.5 mmHg. The mean mitral valve gradient is 2.0 mmHg. Tricuspid Valve: The tricuspid valve is normal in structure. Tricuspid valve regurgitation is not demonstrated. Aortic Valve: The aortic valve is tricuspid. Aortic valve regurgitation is not visualized. Aortic valve mean gradient measures 3.0 mmHg. Aortic valve peak gradient measures 6.2 mmHg. Aortic valve area, by VTI measures 2.27 cm. Pulmonic Valve: The pulmonic valve was normal in  structure. Pulmonic valve regurgitation is not visualized. Aorta: The aortic root is normal in size and structure. Venous: The inferior vena cava is normal in size with greater than 50% respiratory variability, suggesting right atrial pressure of 3 mmHg. IAS/Shunts: No atrial level shunt detected by color flow Doppler. Additional Comments: 3D was performed not requiring image post processing on an independent workstation and was indeterminate.  LEFT VENTRICLE PLAX 2D LVIDd:         4.70 cm     Diastology LVIDs:         3.50 cm     LV e' medial:    6.64 cm/s LV PW:         1.50 cm     LV E/e' medial:  9.2 LV IVS:        1.40 cm     LV e' lateral:   8.27 cm/s LVOT diam:     2.00 cm     LV E/e' lateral: 7.4 LV SV:         51 LV SV Index:   22 LVOT Area:     3.14 cm  LV Volumes (MOD) LV vol d, MOD A2C: 53.0 ml LV vol d, MOD A4C: 48.4 ml LV vol s, MOD A2C: 27.1 ml LV vol s, MOD A4C: 23.7 ml LV SV MOD A2C:     25.9 ml LV SV MOD A4C:     48.4 ml LV SV MOD BP:      26.1 ml RIGHT VENTRICLE RV Basal diam:  3.70 cm    PULMONARY VEINS RV Mid diam:    2.70 cm    Diastolic Velocity: 6.42 cm/s RV S prime:     9.90 cm/s LEFT ATRIUM             Index        RIGHT ATRIUM           Index LA diam:  3.40 cm 1.49 cm/m   RA Area:     19.90 cm LA Vol (A2C):   49.0 ml 21.43 ml/m  RA Volume:   55.30 ml  24.19 ml/m LA Vol (A4C):   43.0 ml 18.81 ml/m LA Biplane Vol: 50.5 ml 22.09 ml/m  AORTIC VALVE                    PULMONIC VALVE AV Area (Vmax):    2.36 cm     PV Vmax:       1.33 m/s AV Area (Vmean):   2.18 cm     PV Peak grad:  7.1 mmHg AV Area (VTI):     2.27 cm AV Vmax:           125.00 cm/s AV Vmean:          80.600 cm/s AV VTI:            0.223 m AV Peak Grad:      6.2 mmHg AV Mean Grad:      3.0 mmHg LVOT Vmax:         93.80 cm/s LVOT Vmean:        56.000 cm/s LVOT VTI:          0.161 m LVOT/AV VTI ratio: 0.72  AORTA Ao Root diam: 3.80 cm MITRAL VALVE MV Area (PHT): 5.54 cm    SHUNTS MV Area VTI:   2.42 cm    Systemic  VTI:  0.16 m MV Peak grad:  3.5 mmHg    Systemic Diam: 2.00 cm MV Mean grad:  2.0 mmHg MV Vmax:       0.94 m/s MV Vmean:      62.3 cm/s MV Decel Time: 137 msec MV E velocity: 60.80 cm/s MV A velocity: 98.90 cm/s MV E/A ratio:  0.61 Debbe Odea MD Electronically signed by Debbe Odea MD Signature Date/Time: 05/15/2023/12:22:01 PM    Final    PERIPHERAL VASCULAR CATHETERIZATION Result Date: 05/13/2023 See surgical note for result.  MR ANKLE RIGHT WO CONTRAST Result Date: 05/12/2023 CLINICAL DATA:  Osteomyelitis suspected, ankle, xray done heel ulcer Chronic peripheral vascular disease. History of left below the knee amputation. EXAM: MRI OF THE RIGHT ANKLE WITHOUT CONTRAST TECHNIQUE: Multiplanar, multisequence MR imaging of the ankle was performed. No intravenous contrast was administered. COMPARISON:  Right foot radiographs 05/11/2023 FINDINGS: TENDONS Peroneal: Intact and normally positioned. Posteromedial: Intact and normally positioned. Anterior: Intact and normally positioned. Achilles: Intact. Plantar Fascia: Intact. LIGAMENTS Lateral: The anterior and posterior talofibular and calcaneofibular ligaments are intact.The inferior tibiofibular ligaments appear intact. Medial: The deltoid and visualized portions of the spring ligament appear intact. CARTILAGE AND BONES Ankle Joint: No significant ankle joint effusion. Mild tibiotalar degenerative changes with reactive cyst formation in the medial malleolus. Intact talar dome. Subtalar Joints/Sinus Tarsi: Subtalar degenerative changes with edema or fibrosis in the tarsal sinus. Bones: No evidence of acute fracture, dislocation or osteomyelitis within the ankle or hindfoot. Mild reactive edema in the lateral plantar aspect of the calcaneal tuberosity without cortical destruction. This appears removed from the plantar hindfoot ulcer. Hindfoot valgus deformity with findings of extra-articular lateral hindfoot impingement syndrome. Other: Plantar hindfoot  subcutaneous ulcer as seen on prior radiographs. No underlying focal fluid collection or foreign body identified. Mild nonspecific subcutaneous edema surrounding the ankle. IMPRESSION: 1. Plantar hindfoot subcutaneous ulcer without underlying focal fluid collection or foreign body. 2. No evidence of osteomyelitis within the ankle or hindfoot. Mild reactive edema in the lateral  plantar aspect of the calcaneal tuberosity. 3. Hindfoot valgus deformity with findings of extra-articular lateral hindfoot impingement syndrome. 4. The ankle tendons and ligaments appear intact. Electronically Signed   By: Carey Bullocks M.D.   On: 05/12/2023 16:36   DG Foot Complete Right Result Date: 05/11/2023 CLINICAL DATA:  Right heel wound.  Diabetic foot ulcer. EXAM: RIGHT FOOT COMPLETE - 3+ VIEW COMPARISON:  None Available. FINDINGS: Vascular calcifications. Moderate hallux valgus deformity with mild secondary osteoarthritis. Skin defect about the plantar aspect of the calcaneus. No radiopaque foreign object. No underlying osseous erosion IMPRESSION: Soft tissue ulcer about the plantar aspect of the heel without plain film evidence of osteomyelitis. Electronically Signed   By: Jeronimo Greaves M.D.   On: 05/11/2023 18:29    Microbiology: Recent Results (from the past 240 hours)  Culture, blood (routine x 2)     Status: Abnormal   Collection Time: 05/12/23  1:42 AM   Specimen: BLOOD  Result Value Ref Range Status   Specimen Description   Final    BLOOD LAC Performed at [facility], [address] [place], [place] [address]    Special Requests   Final    BOTTLES DRAWN AEROBIC AND ANAEROBIC Blood Culture results may not be optimal due to an inadequate volume of blood received in culture bottles Performed at [facility], [address] [place], [place] [address]    Culture  Setup Time   Final    GRAM POSITIVE COCCI IN BOTH AEROBIC AND ANAEROBIC BOTTLES CRITICAL RESULT CALLED TO, READ BACK BY AND  VERIFIED WITH: ANDREA DOBBS AT 1905 ON 05/12/23 BY SS    Culture STAPHYLOCOCCUS AUREUS (A)  Final   Report Status 05/14/2023 FINAL  Final   Organism ID, Bacteria STAPHYLOCOCCUS AUREUS  Final      Susceptibility   Staphylococcus aureus - MIC*    CIPROFLOXACIN <=0.5 SENSITIVE Sensitive     ERYTHROMYCIN >=8 RESISTANT Resistant     GENTAMICIN <=0.5 SENSITIVE Sensitive     OXACILLIN <=0.25 SENSITIVE Sensitive     TETRACYCLINE <=1 SENSITIVE Sensitive     VANCOMYCIN 1 SENSITIVE Sensitive     TRIMETH/SULFA <=10 SENSITIVE Sensitive     CLINDAMYCIN RESISTANT Resistant     RIFAMPIN <=0.5 SENSITIVE Sensitive     Inducible Clindamycin POSITIVE Resistant     LINEZOLID 2 SENSITIVE Sensitive     * STAPHYLOCOCCUS AUREUS  Aerobic/Anaerobic Culture w Gram Stain (surgical/deep wound)     Status: None   Collection Time: 05/12/23  1:42 AM   Specimen: Heel  Result Value Ref Range Status   Specimen Description   Final    HEEL Performed at [facility], [address] [place], [place] [address]    Special Requests   Final    NONE Performed at [facility], [address] [place], [place] [address]    Gram Stain   Final    FEW WBC PRESENT, PREDOMINANTLY MONONUCLEAR FEW GRAM POSITIVE COCCI IN PAIRS IN SINGLES    Culture   Final    MODERATE GROUP B STREP(S.AGALACTIAE)ISOLATED TESTING AGAINST S. AGALACTIAE NOT ROUTINELY PERFORMED DUE TO PREDICTABILITY OF AMP/PEN/VAN SUSCEPTIBILITY. FEW STAPHYLOCOCCUS AUREUS NO ANAEROBES ISOLATED Performed at [facility], [address], [place], [place] [address]    Report Status 05/17/2023 FINAL  Final   Organism ID, Bacteria STAPHYLOCOCCUS AUREUS  Final      Susceptibility   Staphylococcus aureus - MIC*    CIPROFLOXACIN <=0.5 SENSITIVE Sensitive  ERYTHROMYCIN RESISTANT Resistant     GENTAMICIN <=0.5 SENSITIVE Sensitive     OXACILLIN 0.5 SENSITIVE Sensitive     TETRACYCLINE <=1 SENSITIVE Sensitive     VANCOMYCIN <=0.5 SENSITIVE  Sensitive     TRIMETH/SULFA <=10 SENSITIVE Sensitive     CLINDAMYCIN RESISTANT Resistant     RIFAMPIN <=0.5 SENSITIVE Sensitive     Inducible Clindamycin POSITIVE Resistant     LINEZOLID 4 SENSITIVE Sensitive     * FEW STAPHYLOCOCCUS AUREUS  Blood Culture ID Panel (Reflexed)     Status: Abnormal   Collection Time: 05/12/23  1:42 AM  Result Value Ref Range Status   Enterococcus faecalis NOT DETECTED NOT DETECTED Final   Enterococcus Faecium NOT DETECTED NOT DETECTED Final   Listeria monocytogenes NOT DETECTED NOT DETECTED Final   Staphylococcus species DETECTED (A) NOT DETECTED Final    Comment: CRITICAL RESULT CALLED TO, READ BACK BY AND VERIFIED WITH: ANDREA DOBBS AT 1905 ON 05/12/23 BY SS    Staphylococcus aureus (BCID) DETECTED (A) NOT DETECTED Final    Comment: CRITICAL RESULT CALLED TO, READ BACK BY AND VERIFIED WITH: ANDREA DOBBS AT 1905 ON 05/12/23 BY SS    Staphylococcus epidermidis NOT DETECTED NOT DETECTED Final   Staphylococcus lugdunensis NOT DETECTED NOT DETECTED Final   Streptococcus species NOT DETECTED NOT DETECTED Final   Streptococcus agalactiae NOT DETECTED NOT DETECTED Final   Streptococcus pneumoniae NOT DETECTED NOT DETECTED Final   Streptococcus pyogenes NOT DETECTED NOT DETECTED Final   A.calcoaceticus-baumannii NOT DETECTED NOT DETECTED Final   Bacteroides fragilis NOT DETECTED NOT DETECTED Final   Enterobacterales NOT DETECTED NOT DETECTED Final   Enterobacter cloacae complex NOT DETECTED NOT DETECTED Final   Escherichia coli NOT DETECTED NOT DETECTED Final   Klebsiella aerogenes NOT DETECTED NOT DETECTED Final   Klebsiella oxytoca NOT DETECTED NOT DETECTED Final   Klebsiella pneumoniae NOT DETECTED NOT DETECTED Final   Proteus species NOT DETECTED NOT DETECTED Final   Salmonella species NOT DETECTED NOT DETECTED Final   Serratia marcescens NOT DETECTED NOT DETECTED Final   Haemophilus influenzae NOT DETECTED NOT DETECTED Final   Neisseria meningitidis  NOT DETECTED NOT DETECTED Final   Pseudomonas aeruginosa NOT DETECTED NOT DETECTED Final   Stenotrophomonas maltophilia NOT DETECTED NOT DETECTED Final   Candida albicans NOT DETECTED NOT DETECTED Final   Candida auris NOT DETECTED NOT DETECTED Final   Candida glabrata NOT DETECTED NOT DETECTED Final   Candida krusei NOT DETECTED NOT DETECTED Final   Candida parapsilosis NOT DETECTED NOT DETECTED Final   Candida tropicalis NOT DETECTED NOT DETECTED Final   Cryptococcus neoformans/gattii NOT DETECTED NOT DETECTED Final   Meth resistant mecA/C and MREJ NOT DETECTED NOT DETECTED Final    Comment: Performed at [facility], [address] [place], [place] [address]  Culture, blood (routine x 2)     Status: None   Collection Time: 05/12/23  3:00 AM   Specimen: BLOOD  Result Value Ref Range Status   Specimen Description BLOOD LEFT HAND  Final   Special Requests   Final    BOTTLES DRAWN AEROBIC ONLY Blood Culture adequate volume   Culture   Final    NO GROWTH 5 DAYS Performed at [facility], [address] [place], [place] [address]    Report Status 05/17/2023 FINAL  Final  Culture, blood (Routine X 2) w Reflex to ID Panel     Status: None (Preliminary result)   Collection Time: 05/14/23  3:18 PM  Specimen: BLOOD  Result Value Ref Range Status   Specimen Description BLOOD BLOOD RIGHT ARM  Final   Special Requests   Final    BOTTLES DRAWN AEROBIC AND ANAEROBIC Blood Culture adequate volume   Culture   Final    NO GROWTH 4 DAYS Performed at [facility], [address] [place], [place] [address]    Report Status PENDING  Incomplete  Culture, blood (Routine X 2) w Reflex to ID Panel     Status: None (Preliminary result)   Collection Time: 05/14/23  3:18 PM   Specimen: BLOOD  Result Value Ref Range Status   Specimen Description BLOOD BLOOD RIGHT HAND  Final   Special Requests   Final    BOTTLES DRAWN AEROBIC AND ANAEROBIC Blood Culture adequate volume    Culture   Final    NO GROWTH 4 DAYS Performed at [facility], [address] [place], [place] [address]    Report Status PENDING  Incomplete     Labs: CBC: Recent Labs  Lab 05/17/23 0215 05/18/23 0519 05/19/23 0442 05/20/23 0505 05/21/23 0402  WBC 6.1 5.6 5.7 5.2 6.0  HGB 15.5 15.5 14.9 15.4 15.3  HCT 45.5 46.2 43.8 45.7 44.3  MCV 91.7 93.5 91.3 91.2 89.9  PLT 181 173 182 188 178   Basic Metabolic Panel: Recent Labs  Lab 05/15/23 0320 05/16/23 0449 05/17/23 0215 05/18/23 0519 05/19/23 0442 05/20/23 0505 05/21/23 0402  NA 135   < > 132* 135 135 135 132*  K 4.9   < > 4.8 5.2* 4.6 4.6 4.4  CL 101   < > 100 100 99 103 99  CO2 23   < > 25 26 30 26 26   GLUCOSE 324*   < > 219* 204* 276* 122* 243*  BUN 32*   < > 37* 37* 33* 21 22  CREATININE 1.23   < > 1.30* 1.28* 1.28* 1.01 1.09  CALCIUM 8.8*   < > 8.3* 8.6* 8.6* 8.6* 8.3*  MG 2.2  --   --  2.5* 2.4 2.1  --   PHOS 3.7  --   --  3.1 3.7 3.1  --    < > = values in this interval not displayed.   Liver Function Tests: No results for input(s): "AST", "ALT", "ALKPHOS", "BILITOT", "PROT", "ALBUMIN" in the last 168 hours. No results for input(s): "LIPASE", "AMYLASE" in the last 168 hours. No results for input(s): "AMMONIA" in the last 168 hours. Cardiac Enzymes: No results for input(s): "CKTOTAL", "CKMB", "CKMBINDEX", "TROPONINI" in the last 168 hours. BNP (last 3 results) No results for input(s): "BNP" in the last 8760 hours. CBG: Recent Labs  Lab 05/20/23 0800 05/20/23 1151 05/20/23 1608 05/20/23 2202 05/21/23 0815  GLUCAP 118* 134* 161* 113* 264*    Time spent: 35 minutes  Signed:  Gillis Santa  [facility] 05/21/2023 10:56 AM

## 2023-05-21 NOTE — Plan of Care (Signed)
   Problem: Fluid Volume: Goal: Ability to maintain a balanced intake and output will improve Outcome: Progressing   Problem: Metabolic: Goal: Ability to maintain appropriate glucose levels will improve Outcome: Progressing   Problem: Nutritional: Goal: Maintenance of adequate nutrition will improve Outcome: Progressing

## 2023-05-21 NOTE — TOC Progression Note (Signed)
 Transition of Care [facility]) - Progression Note    Patient Details  Name: Corey Foster MRN: [id number] Date of Birth: May 28, 1955  Transition of Care [facility]) CM/SW Contact  Marlowe Sax, RN Phone Number: 05/21/2023, 11:48 AM  Clinical Narrative:    Reached out to [facility] at 3 M to inquire about when the wound vac will be delivered, awaiting a response   Expected Discharge Plan: Home w Home Health Services Barriers to Discharge: Continued Medical Work up  Expected Discharge Plan and Services   Discharge Planning Services: CM Consult   Living arrangements for the past 2 months: Single Family Home Expected Discharge Date: 05/21/23               DME Arranged: Hospital bed DME Agency: [employer] Date DME Agency Contacted: 05/14/23   Representative spoke with at DME Agency: Cletis [place] HH Arranged: PT, OT [facility] Agency: [facility] Date HH Agency Contacted: 05/14/23 Time HH Agency Contacted: [id number] Representative spoke with at [facility] Agency: Renee Harder   Social Determinants of Health (SDOH) Interventions SDOH Screenings   Food Insecurity: No Food Insecurity (05/12/2023)  Housing: Low Risk  (05/12/2023)  Transportation Needs: No Transportation Needs (05/12/2023)  Utilities: Not At Risk (05/12/2023)  Social Connections: Unknown (05/12/2023)  Tobacco Use: High Risk (05/18/2023)    Readmission Risk Interventions    05/14/2023    3:51 PM  Readmission Risk Prevention Plan  Transportation Screening Complete  PCP or Specialist Appt within 3-5 Days Complete  HRI or Home Care Consult Complete  Social Work Consult for Recovery Care Planning/Counseling Complete  Palliative Care Screening Not Applicable  Medication Review Oceanographer) Referral to Pharmacy

## 2023-05-21 NOTE — Progress Notes (Signed)
 PHARMACY CONSULT NOTE FOR:  OUTPATIENT  PARENTERAL ANTIBIOTIC THERAPY (OPAT)  Indication: MSSA heel ulcer and MSSA bacteremia Regimen: Dalbavancin 1500mg  IV x 1 on 05/28/2023   IV antibiotic discharge orders are pended. To discharging provider:  please sign these orders via discharge navigator,  Select New Orders & click on the button choice - Manage This Unsigned Work.     Thank you for allowing pharmacy to be a part of this patient's care.  Juliette Alcide, PharmD, BCPS, BCIDP Work Cell: [phone] 05/21/2023 10:11 AM

## 2023-05-22 LAB — CULTURE, BLOOD (ROUTINE X 2)
Culture: NO GROWTH
Culture: NO GROWTH
Special Requests: ADEQUATE
Special Requests: ADEQUATE

## 2023-05-26 ENCOUNTER — Encounter (INDEPENDENT_AMBULATORY_CARE_PROVIDER_SITE_OTHER)

## 2023-05-26 ENCOUNTER — Ambulatory Visit (INDEPENDENT_AMBULATORY_CARE_PROVIDER_SITE_OTHER): Admitting: Vascular Surgery

## 2023-05-28 ENCOUNTER — Telehealth: Payer: Self-pay

## 2023-05-28 ENCOUNTER — Inpatient Hospital Stay: Admitting: Infectious Diseases

## 2023-05-28 NOTE — Progress Notes (Signed)
 Patient referred to [facility] Pharmacy team due to uncontrolled diabetes on admission. Patient's primary care provider is Ngwe Aycock with [facility] in [place], [place]  Patient was admitted from 05/12/23 to 05/21/23 with S aureus bacteremia due to R heel ulcer. PMH includes T2DM, PAD with hx of L BKA. In the hospital was getting Semglee (insulin glargine) 43 units daily and Novolog 14 units TID with meals - was running 200-300 mg/dL throughout the day.   Outreached patient to discuss diabetes control and medication management  Patient reports he is doing well since discharge. Still in a lot of pain which he associates with higher blood sugars. He has since Dr. Letta Pate since discharge and says he has another appointment coming up. No issues picking up or administering insulin. Denies mised doses.   Lab Results  Component Value Date   HGBA1C >15.5 (H) 11/27/2022    Current antihyperglycemics: Jardiance 25 mg daily, Toujeo U300 (insulin glargine) 80 units in the AM and 120 units in the PM - only taking 80 units in the evening, Novolog (flexpen, insulin aspart) 40 units TID with meals - taking 15-20 units with meals   Patient has a glucose monitoring device - Freestyle Libre 2. Reports that it is working well for him.   Endorses that he is having some hypoglycemia around 3-4 PM starts to go down. Reports lowest BG 69 since he has been out of the hospital. Associated with symptoms of weakness. Reports appropriate hypoglycemia management.   Current blood glucose readings: Highest reported BG 325 mg/dL. Reports usually BG is < 200 mg/dL throughout the day. Rarely over 220 mg/dL.   Reports that BG depends on what he eats - he is using Splenda to help reduce the amount of sugar he eats. Repots that skittles are one of the main foods he "binges" with. Also reports that he binges on chocolate.   Denies s/sx of hyperglycemia.  Assessment/Plan: - Patient with last A1C recorded in medical record >  15.5 % in Oct 2024 - per patient reported BG sounds like glycemic control has improved. Suspect that suboptimal control is likely due to dietary indiscretion.  - Patient has been self-adjusting meal time insulin doses to prevent hypoglycemia. Encouraged patient to discuss adjustment in his insulin dose with his PCP if he continues to have hypoglycemia.  - Discussed importance of medication adherence and continued follow-up for BG management.  - No follow-up scheduled, as patient is established with an outside health system   Nils Pyle, PharmD PGY1 Pharmacy Resident

## 2023-06-02 ENCOUNTER — Inpatient Hospital Stay: Admitting: Infectious Diseases

## 2023-06-04 ENCOUNTER — Telehealth: Payer: Self-pay

## 2023-06-04 ENCOUNTER — Ambulatory Visit: Attending: Infectious Diseases | Admitting: Infectious Diseases

## 2023-06-04 ENCOUNTER — Encounter: Payer: Self-pay | Admitting: Infectious Diseases

## 2023-06-04 ENCOUNTER — Ambulatory Visit
Admission: RE | Admit: 2023-06-04 | Discharge: 2023-06-04 | Disposition: A | Discharge status: Home / Self Care | Source: Ambulatory Visit | Attending: Infectious Diseases | Admitting: Infectious Diseases

## 2023-06-04 VITALS — BP 160/84 | HR 93 | Temp 98.1°F | Resp 17

## 2023-06-04 VITALS — BP 130/81 | HR 93 | Temp 97.9°F

## 2023-06-04 DIAGNOSIS — Z5982 Transportation insecurity: Secondary | ICD-10-CM | POA: Insufficient documentation

## 2023-06-04 DIAGNOSIS — R7881 Bacteremia: Secondary | ICD-10-CM

## 2023-06-04 DIAGNOSIS — L97418 Non-pressure chronic ulcer of right heel and midfoot with other specified severity: Secondary | ICD-10-CM | POA: Insufficient documentation

## 2023-06-04 DIAGNOSIS — Z7984 Long term (current) use of oral hypoglycemic drugs: Secondary | ICD-10-CM | POA: Insufficient documentation

## 2023-06-04 DIAGNOSIS — Z794 Long term (current) use of insulin: Secondary | ICD-10-CM | POA: Diagnosis not present

## 2023-06-04 DIAGNOSIS — E11628 Type 2 diabetes mellitus with other skin complications: Secondary | ICD-10-CM | POA: Diagnosis not present

## 2023-06-04 DIAGNOSIS — B9561 Methicillin susceptible Staphylococcus aureus infection as the cause of diseases classified elsewhere: Secondary | ICD-10-CM | POA: Diagnosis not present

## 2023-06-04 DIAGNOSIS — L97415 Non-pressure chronic ulcer of right heel and midfoot with muscle involvement without evidence of necrosis: Secondary | ICD-10-CM | POA: Diagnosis present

## 2023-06-04 DIAGNOSIS — L97419 Non-pressure chronic ulcer of right heel and midfoot with unspecified severity: Secondary | ICD-10-CM

## 2023-06-04 DIAGNOSIS — L089 Local infection of the skin and subcutaneous tissue, unspecified: Secondary | ICD-10-CM | POA: Diagnosis not present

## 2023-06-04 DIAGNOSIS — E11621 Type 2 diabetes mellitus with foot ulcer: Secondary | ICD-10-CM | POA: Insufficient documentation

## 2023-06-04 DIAGNOSIS — S90819A Abrasion, unspecified foot, initial encounter: Secondary | ICD-10-CM

## 2023-06-04 LAB — CBC WITH DIFFERENTIAL/PLATELET
Abs Immature Granulocytes: 0.04 10*3/uL (ref 0.00–0.07)
Basophils Absolute: 0 10*3/uL (ref 0.0–0.1)
Basophils Relative: 0 %
Eosinophils Absolute: 0.1 10*3/uL (ref 0.0–0.5)
Eosinophils Relative: 1 %
HCT: 47.4 % (ref 39.0–52.0)
Hemoglobin: 15.8 g/dL (ref 13.0–17.0)
Immature Granulocytes: 1 %
Lymphocytes Relative: 18 %
Lymphs Abs: 1.4 10*3/uL (ref 0.7–4.0)
MCH: 30.9 pg (ref 26.0–34.0)
MCHC: 33.3 g/dL (ref 30.0–36.0)
MCV: 92.8 fL (ref 80.0–100.0)
Monocytes Absolute: 0.9 10*3/uL (ref 0.1–1.0)
Monocytes Relative: 12 %
Neutro Abs: 5.5 10*3/uL (ref 1.7–7.7)
Neutrophils Relative %: 68 %
Platelets: 211 10*3/uL (ref 150–400)
RBC: 5.11 MIL/uL (ref 4.22–5.81)
RDW: 14.7 % (ref 11.5–15.5)
WBC: 8.1 10*3/uL (ref 4.0–10.5)
nRBC: 0 % (ref 0.0–0.2)

## 2023-06-04 LAB — COMPREHENSIVE METABOLIC PANEL WITH GFR
ALT: 24 U/L (ref 0–44)
AST: 12 U/L — ABNORMAL LOW (ref 15–41)
Albumin: 3.1 g/dL — ABNORMAL LOW (ref 3.5–5.0)
Alkaline Phosphatase: 61 U/L (ref 38–126)
Anion gap: 8 (ref 5–15)
BUN: 21 mg/dL (ref 8–23)
CO2: 21 mmol/L — ABNORMAL LOW (ref 22–32)
Calcium: 8.5 mg/dL — ABNORMAL LOW (ref 8.9–10.3)
Chloride: 110 mmol/L (ref 98–111)
Creatinine, Ser: 1.12 mg/dL (ref 0.61–1.24)
GFR, Estimated: >60 mL/min (ref >60)
Glucose, Bld: 251 mg/dL — ABNORMAL HIGH (ref 70–99)
Potassium: 4.1 mmol/L (ref 3.5–5.1)
Sodium: 139 mmol/L (ref 135–145)
Total Bilirubin: 0.2 mg/dL (ref 0.0–1.2)
Total Protein: 7.1 g/dL (ref 6.5–8.1)

## 2023-06-04 LAB — SEDIMENTATION RATE: Sed Rate: 7 mm/h (ref 0–20)

## 2023-06-04 LAB — C-REACTIVE PROTEIN: CRP: 1.5 mg/dL — ABNORMAL HIGH (ref <1.0)

## 2023-06-04 MED ORDER — DEXTROSE 5 % IV SOLN
1500.0000 mg | Freq: Once | INTRAVENOUS | Status: AC
  Administered 2023-06-04: 1500 mg via INTRAVENOUS
  Filled 2023-06-04: qty 75

## 2023-06-04 NOTE — Patient Instructions (Signed)
 VISIT SUMMARY:  You came in today for a follow-up on your foot wound infection. You were previously hospitalized for a bacterial infection in your blood and wound, and you were treated with antibiotics. Since your discharge, you have had some issues with your wound care and missed a follow-up appointment for additional antibiotics. You also experienced gastrointestinal distress from medications given in the hospital, which you believe affected your foot condition.  YOUR PLAN:  -MSSA BACTEREMIA AND WOUND INFECTION: You have a bacterial infection in your blood and foot wound caused by methicillin-sensitive Staphylococcus aureus (MSSA). We need to ensure you complete your antibiotic treatment and properly care for your wound. Blood tests will be done to check your kidney and liver function before giving you another dose of intravenous antibiotics. We will arrange for you to receive this treatment today or tomorrow. Please make sure to attend your appointment with the podiatrist tomorrow.   INSTRUCTIONS:  Please go for blood tests to check your kidney and liver function before your next dose of intravenous antibiotics. Attend your appointment with the podiatrist tomorrow, and we will help arrange transportation if needed.

## 2023-06-04 NOTE — Telephone Encounter (Signed)
 Patient informed of lab results and verbalized understanding. Patient had no questions.

## 2023-06-04 NOTE — Progress Notes (Signed)
 NAME: Corey Foster  DOB: 09-20-1955  MRN: [id number]  Date/Time: 06/04/2023 9:05 AM   Subjective:   ?pt here to follow up for rt foot infection and MSSA bacteremia  Corey Foster is a 68 y.o. 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  Culture of the wound was MSSA and GBS. MRI of the rt foot-  No evidence of osteomyelitis within the ankle or hindfoot. Mild reactive edema in the lateral plantar aspect of the calcaneal tuberosity.During his hospital stay, he received intravenous antibiotics and underwent a transesophageal echocardiogram (TEE), which showed no cardiac involvement. As he did not want PICC and home IV , he was given long acting dalbavancin on 05/21/23  Upon discharge, and was asked to come to day surgery in a week to get the 2nd dose  Since being discharged, he has experienced issues with his wound care. He was supposed to receive a second dose of intravenous antibiotics a week after discharge but missed the appointment due to lack of transportation. His wound vacuum device has not been functioning for the past four days due to a dead battery, and he was unsure whom to inform. The wound dressing was changed only once since discharge, and his daughter recently changed the bandage, but the wound vacuum was not changed He missed my appt and did not go to day surgery- he missed appt with Dr.Baker foot surgeon HE was also given cefadroxil 1 gram PO BID for 7 days He has been taking his oral antibiotics, two pills in the morning and two at night, but still has four pills remaining, suggesting he may have missed doses. He also experienced gastrointestinal distress due to laxatives given in the hospital,  which he felt were unnecessary. This led to episodes of incontinence and difficulty managing his personal hygiene, which he believes contributed to the deterioration of his foot condition.  No fever or chills. He did not bring his medications to the appointment, so the exact medications and dosages could not be verified.   He finally found transportation and got his friend to bring him to the visit Past Medical History:  Diagnosis Date   Arthritis    Diabetes mellitus without complication (HCC)    DJD (degenerative joint disease)    GERD (gastroesophageal reflux disease)    Hyperlipidemia    Hypertension    Sciatic nerve pain    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   Sleep apnea     Past Surgical History:  Procedure Laterality Date   AMPUTATION Left 11/29/2022   Procedure: AMPUTATION RAY, PARTIAL 1ST RAY;  Surgeon: Rosetta Posner, DPM;  Location: [facility];  Service: Orthopedics/Podiatry;  Laterality: Left;   AMPUTATION Left 12/19/2022   Procedure: FIRST LEFT RAY AMPUTATION;  Surgeon: Rosetta Posner, DPM;  Location: [facility];  Service: Orthopedics/Podiatry;  Laterality: Left;   AMPUTATION Left 01/09/2023   Procedure: AMPUTATION BELOW KNEE;  Surgeon: Annice Needy, MD;  Location: [facility];  Service: General;  Laterality: Left;   CYSTOSCOPY W/ RETROGRADES Bilateral 05/31/2019   Procedure: CYSTOSCOPY WITH RETROGRADE PYELOGRAM;  Surgeon: Riki Altes, MD;  Location: [facility];  Service: Urology;  Laterality: Bilateral;   LOWER EXTREMITY ANGIOGRAPHY Left  11/28/2022   Procedure: Lower Extremity Angiography;  Surgeon: Annice Needy, MD;  Location: [facility];  Service: Cardiovascular;  Laterality: Left;   LOWER EXTREMITY ANGIOGRAPHY Left 12/18/2022   Procedure: Lower Extremity Angiography;  Surgeon: Annice Needy, MD;  Location: [facility];  Service: Cardiovascular;  Laterality: Left;   LOWER EXTREMITY ANGIOGRAPHY Right 05/13/2023   Procedure: Lower Extremity Angiography;  Surgeon:  Annice Needy, MD;  Location: [facility];  Service: Cardiovascular;  Laterality: Right;   TEE WITHOUT CARDIOVERSION N/A 05/19/2023   Procedure: ECHOCARDIOGRAM, TRANSESOPHAGEAL;  Surgeon: Antonieta Iba, MD;  Location: [facility];  Service: Cardiovascular;  Laterality: N/A;   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-C N/A 05/31/2019   Procedure: TRANSURETHRAL RESECTION OF BLADDER TUMOR WITH gemcitabine;  Surgeon: Riki Altes, MD;  Location: [facility];  Service: Urology;  Laterality: N/A;    Social History   Socioeconomic History   Marital status: Single    Spouse name: Not on file   Number of children: Not on file   Years of education: Not on file   Highest education level: Not on file  Occupational History   Not on file  Tobacco Use   Smoking status: Every Day    Current packs/day: 0.25    Average packs/day: 0.3 packs/day for 40.0 years (10.0 ttl pk-yrs)    Types: Cigarettes   Smokeless tobacco: Never  Vaping Use   Vaping status: Never Used  Substance and Sexual Activity   Alcohol use: Yes    Alcohol/week: 18.0 standard drinks of alcohol    Types: 12 Cans of beer, 6 Shots of liquor per week   Drug use: Not Currently    Types: Cocaine, Marijuana   Sexual activity: Yes  Other Topics Concern   Not on file  Social History Narrative   Not on file   Social Drivers of Health   Financial Resource Strain: Not on file  Food Insecurity: No Food Insecurity (05/12/2023)   Hunger Vital Sign    Worried About Running Out of Food in the Last Year: Never true    Ran Out of Food in the Last Year: Never true  Transportation Needs: No Transportation Needs (05/12/2023)   PRAPARE - Administrator, Civil Service (Medical): No    Lack of Transportation (Non-Medical): No  Physical Activity: Not on file  Stress: Not on file  Social Connections: Unknown (05/12/2023)   Social Connection and Isolation Panel [NHANES]    Frequency of Communication with Friends and  Family: More than three times a week    Frequency of Social Gatherings with Friends and Family: Three times a week    Attends Religious Services: 1 to 4 times per year    Active Member of Clubs or Organizations: No    Attends Banker Meetings: Never    Marital Status: Patient declined  Intimate Partner Violence: Not At Risk (05/12/2023)   Humiliation, Afraid, Rape, and Kick questionnaire    Fear of Current or Ex-Partner: No    Emotionally Abused: No    Physically Abused: No    Sexually Abused: No    Family History  Problem Relation Age of Onset   Diabetes Mother    Heart disease Mother    Diabetes Father    Heart disease Father    Diabetes Brother    No Known Allergies I? Current Outpatient Medications  Medication Sig Dispense Refill   acetaminophen (TYLENOL) 325 MG tablet  Take 2 tablets (650 mg total) by mouth every 6 (six) hours as needed for mild pain (pain score 1-3), moderate pain (pain score 4-6), fever or headache (or Fever >/= 101).     ALPRAZolam (XANAX) 1 MG tablet Take 1 tablet (1 mg total) by mouth 3 (three) times daily as needed for anxiety. 10 tablet 0   AMBULATORY NON FORMULARY MEDICATION Trimix (30/1/10)-(Pap/Phent/PGE)  Dosage: Inject 0.3 cc and may increase 0.1cc to achieve and erection lasting no longer than 1 hour per injection   Vial 1ml  Qty #5 refills 2  [facility] [phone] Fax [phone] 5 mL 2   aspirin EC 325 MG tablet Take 325 mg by mouth daily.     baclofen (LIORESAL) 10 MG tablet Take 10 mg by mouth 2 (two) times daily.      bisacodyl (DULCOLAX) 10 MG suppository Place 1 suppository (10 mg total) rectally daily as needed for severe constipation.     bisacodyl (DULCOLAX) 5 MG EC tablet Take 2 tablets (10 mg total) by mouth at bedtime.     Continuous Glucose Sensor (FREESTYLE LIBRE 2 SENSOR) MISC      cyanocobalamin (VITAMIN B12) 500 MCG tablet Take 1 tablet (500 mcg total) by mouth daily. 30 tablet 2   JARDIANCE 25  MG TABS tablet Take 25 mg by mouth daily.     naloxone (NARCAN) 0.4 MG/ML injection Inject as directed as directed     NOVOLOG FLEXPEN 100 UNIT/ML FlexPen Inject 40 Units into the skin 3 (three) times daily with meals.     Nutritional Supplements (KETO PO) Take 1 capsule by mouth.     Oxycodone HCl 20 MG TABS Take 1 tablet by mouth 4 (four) times daily as needed.     polyethylene glycol (MIRALAX / GLYCOLAX) 17 g packet Take 17 g by mouth 2 (two) times daily.     pregabalin (LYRICA) 75 MG capsule Take 1 capsule (75 mg total) by mouth every evening. 30 capsule 5   simvastatin (ZOCOR) 40 MG tablet Take 1 tablet (40 mg total) by mouth daily. 30 tablet 0   TOUJEO MAX SOLOSTAR 300 UNIT/ML Solostar Pen Inject 80-120 Units into the skin in the morning and at bedtime. Inject 80 Units into the skin daily. 80 units in the am and 120 units at bedtime     vitamin C (ASCORBIC ACID) 250 MG tablet Take 250 mg by mouth daily.     Vitamin D, Ergocalciferol, (DRISDOL) 1.25 MG (50000 UNIT) CAPS capsule Take 1 capsule (50,000 Units total) by mouth every 7 (seven) days. 12 capsule 0   No current facility-administered medications for this visit.     Abtx:  Anti-infectives (From admission, onward)    None       REVIEW OF SYSTEMS:  Const: negative fever, negative chills, negative weight loss, wheel chair bound Eyes: negative diplopia or visual changes, negative eye pain ENT: negative coryza, negative sore throat Resp: negative cough, hemoptysis, dyspnea Cards: negative for chest pain, palpitations, lower extremity edema GU: negative for frequency, dysuria and hematuria GI: Negative for abdominal pain, diarrhea, bleeding, constipation Skin: negative for rash and pruritus Heme: negative for easy bruising and gum/nose bleeding MS: left BKA Neurolo:negative for headaches, dizziness, vertigo, memory problems  Psych: anxiety, depression  Endocrine: , diabetes Allergy/Immunology- negative for any medication or  food allergies ?  Objective:  VITALS:  BP 130/81   Pulse 93   Temp 97.9 F (36.6 C) (Temporal)   SpO2 95%  PHYSICAL EXAM:  General: Alert, cooperative, disheveled, in wheel chair. Lungs: Clear to auscultation bilaterally. No Wheezing or Rhonchi. No rales. Heart: Regular rate and rhythm, no murmur, rub or gallop. Abdomen: did not examine Extremities:  Rt foot dresisng removed The wound vac was soaked with fluid and was hanging loose Rt calcanela heel ulcer Maceration of the skin around         Skin tear on the shin  Left BKA Skin: No rashes or lesions. Or bruising Lymph: Cervical, supraclavicular normal. Neurologic: Grossly non-focal Pertinent Labs None     ? Impression/Recommendation ? MSSA bacteremia secondary to infected rt heel ulcer with no osteomyelitis   treated with intravenous antibiotics for ten days, and discharged with long-acting weekly IV dalbavancin and oral cefadroxil. He missed a follow-up for the second intravenous antibiotic dose due to transportation issues. The wound vacuum has been non-functional for four days, and the dressing has not been changed for a week, leading to fluid accumulation and skin maceration. He has not completed the oral antibiotic course, with four pills remaining. Blood tests are needed to assess renal and hepatic function before the next intravenous Dalbavancin dose. Transportation arrangements are being considered to ensure attendance at necessary appointments and treatment. Order blood tests to assess renal and hepatic function. Arrange for intravenous Dalbavancin administration today . Instructed him to attend the appointment with the podiatrist tomorrow. Coordinate transportation if needed. ? Labs today Follow up with Dr.Baker tomorrow ? _ DM on insulin and jardiance Not well controlled  Will decide whether he needs another dose of dalbavancin after his visit with Dr.Baker tomorrow  Discussed the management with  patient

## 2023-06-04 NOTE — Telephone Encounter (Signed)
-----   Message from Lynn Ito sent at 06/04/2023  2:41 PM EDT ----- Please let him know Except for high blood glucose other labs look good- Will decide whether he needs next week dalbavancin after his visit with Dr.Baker tomorrow. I may tentatively schedule for next thursday ----- Message ----- From: Interface, Lab In [place] Sent: 06/04/2023  10:38 AM EDT To: Lynn Ito, MD

## 2023-06-10 NOTE — Telephone Encounter (Signed)
 Patient scheduled for 2nd dalvance infusion and aware of appointment.  Guido Comp Adel Holt, CMA

## 2023-06-11 ENCOUNTER — Ambulatory Visit: Attending: Infectious Diseases | Admitting: Infectious Diseases

## 2023-06-11 ENCOUNTER — Ambulatory Visit
Admission: RE | Admit: 2023-06-11 | Discharge: 2023-06-11 | Disposition: A | Discharge status: Home / Self Care | Source: Ambulatory Visit | Attending: Family Medicine | Admitting: Family Medicine

## 2023-06-11 ENCOUNTER — Encounter: Payer: Self-pay | Admitting: Infectious Diseases

## 2023-06-11 VITALS — BP 140/88 | HR 92

## 2023-06-11 DIAGNOSIS — T368X6A Underdosing of other systemic antibiotics, initial encounter: Secondary | ICD-10-CM | POA: Insufficient documentation

## 2023-06-11 DIAGNOSIS — R7881 Bacteremia: Secondary | ICD-10-CM | POA: Insufficient documentation

## 2023-06-11 DIAGNOSIS — A4901 Methicillin susceptible Staphylococcus aureus infection, unspecified site: Secondary | ICD-10-CM

## 2023-06-11 DIAGNOSIS — E11621 Type 2 diabetes mellitus with foot ulcer: Secondary | ICD-10-CM | POA: Diagnosis not present

## 2023-06-11 DIAGNOSIS — Z794 Long term (current) use of insulin: Secondary | ICD-10-CM | POA: Insufficient documentation

## 2023-06-11 DIAGNOSIS — B9561 Methicillin susceptible Staphylococcus aureus infection as the cause of diseases classified elsewhere: Secondary | ICD-10-CM | POA: Insufficient documentation

## 2023-06-11 DIAGNOSIS — L089 Local infection of the skin and subcutaneous tissue, unspecified: Secondary | ICD-10-CM | POA: Insufficient documentation

## 2023-06-11 DIAGNOSIS — L97418 Non-pressure chronic ulcer of right heel and midfoot with other specified severity: Secondary | ICD-10-CM | POA: Insufficient documentation

## 2023-06-11 DIAGNOSIS — E1165 Type 2 diabetes mellitus with hyperglycemia: Secondary | ICD-10-CM | POA: Insufficient documentation

## 2023-06-11 DIAGNOSIS — Z5982 Transportation insecurity: Secondary | ICD-10-CM | POA: Insufficient documentation

## 2023-06-11 DIAGNOSIS — L97415 Non-pressure chronic ulcer of right heel and midfoot with muscle involvement without evidence of necrosis: Secondary | ICD-10-CM | POA: Insufficient documentation

## 2023-06-11 DIAGNOSIS — L97419 Non-pressure chronic ulcer of right heel and midfoot with unspecified severity: Secondary | ICD-10-CM | POA: Diagnosis not present

## 2023-06-11 DIAGNOSIS — B951 Streptococcus, group B, as the cause of diseases classified elsewhere: Secondary | ICD-10-CM | POA: Diagnosis not present

## 2023-06-11 DIAGNOSIS — Z7984 Long term (current) use of oral hypoglycemic drugs: Secondary | ICD-10-CM | POA: Insufficient documentation

## 2023-06-11 MED ORDER — DALBAVANCIN HCL 500 MG IV SOLR
1000.0000 mg | Freq: Once | INTRAVENOUS | Status: AC
  Administered 2023-06-11: 1000 mg via INTRAVENOUS
  Filled 2023-06-11: qty 50

## 2023-06-11 NOTE — Progress Notes (Signed)
 NAME: Corey Foster  DOB: [birth year]  MRN: [id number]  Date/Time: 06/11/2023 2:41 PM   Subjective:   ?pt here to follow up for rt foot infection and MSSA bacteremia  Last seen a week ago Got Iv dalbavancin  last Thursday Doing better He missed podiatrist appt on 4/11 as no transportation  Corey Foster is a 68 y.o. 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  Culture of the wound was MSSA and GBS. MRI of the rt foot-  No evidence of osteomyelitis within the ankle or hindfoot. Mild reactive edema in the lateral plantar aspect of the calcaneal tuberosity.During his hospital stay, he received intravenous antibiotics and underwent a transesophageal echocardiogram (TEE), which showed no cardiac involvement. As he did not want PICC and home IV , he was given long acting dalbavancin on 05/21/23  Upon discharge, and was asked to come to day surgery in a week to get the 2nd dose  Since being discharged, he has experienced issues with his wound care. He was supposed to receive a second dose of intravenous antibiotics a week after discharge but missed the appointment due to lack of transportation. His wound vacuum device has not been functioning for the past four days due to a dead battery, and he was unsure whom to inform. The wound dressing was changed only once since discharge, and his daughter recently changed the bandage, but the wound vacuum was not changed He missed my appt and did not go to day surgery- he missed appt with Dr.Baker foot surgeon He was non compliant with oral meds HE was sent to Day surgery and received 1500mg  Dalbavancin on 06/04/23 Past Medical History:  Diagnosis Date   Arthritis     Diabetes mellitus without complication (HCC)    DJD (degenerative joint disease)    GERD (gastroesophageal reflux disease)    Hyperlipidemia    Hypertension    Sciatic nerve pain    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   Sleep apnea     Past Surgical History:  Procedure Laterality Date   AMPUTATION Left 11/29/2022   Procedure: AMPUTATION RAY, PARTIAL 1ST RAY;  Surgeon: Rosetta Posner, DPM;  Location: [facility];  Service: Orthopedics/Podiatry;  Laterality: Left;   AMPUTATION Left 12/19/2022   Procedure: FIRST LEFT RAY AMPUTATION;  Surgeon: Rosetta Posner, DPM;  Location: [facility];  Service: Orthopedics/Podiatry;  Laterality: Left;   AMPUTATION Left 01/09/2023   Procedure: AMPUTATION BELOW KNEE;  Surgeon: Annice Needy, MD;  Location: [facility];  Service: General;  Laterality: Left;   CYSTOSCOPY W/ RETROGRADES Bilateral 05/31/2019   Procedure: CYSTOSCOPY WITH RETROGRADE PYELOGRAM;  Surgeon: Riki Altes, MD;  Location: [facility];  Service: Urology;  Laterality: Bilateral;   LOWER EXTREMITY ANGIOGRAPHY Left 11/28/2022   Procedure: Lower Extremity Angiography;  Surgeon: Annice Needy, MD;  Location: [facility];  Service: Cardiovascular;  Laterality: Left;   LOWER EXTREMITY ANGIOGRAPHY Left 12/18/2022   Procedure: Lower Extremity Angiography;  Surgeon: Annice Needy, MD;  Location: [facility];  Service: Cardiovascular;  Laterality: Left;   LOWER EXTREMITY ANGIOGRAPHY Right 05/13/2023   Procedure: Lower Extremity Angiography;  Surgeon: Annice Needy, MD;  [facility]  INVASIVE CV LAB;  Service: Cardiovascular;  Laterality: Right;   TEE WITHOUT CARDIOVERSION N/A 05/19/2023   Procedure: ECHOCARDIOGRAM, TRANSESOPHAGEAL;  Surgeon: Devorah Fonder, MD;  Location: [facility];  Service: Cardiovascular;  Laterality: N/A;   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-C N/A 05/31/2019   Procedure: TRANSURETHRAL RESECTION OF BLADDER TUMOR WITH gemcitabine;  Surgeon: Geraline Knapp, MD;   Location: [facility];  Service: Urology;  Laterality: N/A;    Social History   Socioeconomic History   Marital status: Single    Spouse name: Not on file   Number of children: Not on file   Years of education: Not on file   Highest education level: Not on file  Occupational History   Not on file  Tobacco Use   Smoking status: Every Day    Current packs/day: 0.25    Average packs/day: 0.3 packs/day for 40.0 years (10.0 ttl pk-yrs)    Types: Cigarettes   Smokeless tobacco: Never  Vaping Use   Vaping status: Never Used  Substance and Sexual Activity   Alcohol use: Yes    Alcohol/week: 18.0 standard drinks of alcohol    Types: 12 Cans of beer, 6 Shots of liquor per week   Drug use: Not Currently    Types: Cocaine, Marijuana   Sexual activity: Yes  Other Topics Concern   Not on file  Social History Narrative   Not on file   Social Drivers of Health   Financial Resource Strain: Not on file  Food Insecurity: No Food Insecurity (05/12/2023)   Hunger Vital Sign    Worried About Running Out of Food in the Last Year: Never true    Ran Out of Food in the Last Year: Never true  Transportation Needs: No Transportation Needs (05/12/2023)   PRAPARE - Administrator, Civil Service (Medical): No    Lack of Transportation (Non-Medical): No  Physical Activity: Not on file  Stress: Not on file  Social Connections: Unknown (05/12/2023)   Social Connection and Isolation Panel [NHANES]    Frequency of Communication with Friends and Family: More than three times a week    Frequency of Social Gatherings with Friends and Family: Three times a week    Attends Religious Services: 1 to 4 times per year    Active Member of Clubs or Organizations: No    Attends Banker Meetings: Never    Marital Status: Patient declined  Intimate Partner Violence: Not At Risk (05/12/2023)   Humiliation, Afraid, Rape, and Kick questionnaire    Fear of Current or Ex-Partner: No    Emotionally  Abused: No    Physically Abused: No    Sexually Abused: No    Family History  Problem Relation Age of Onset   Diabetes Mother    Heart disease Mother    Diabetes Father    Heart disease Father    Diabetes Brother    No Known Allergies I? Current Outpatient Medications  Medication Sig Dispense Refill   acetaminophen (TYLENOL) 325 MG tablet Take 2 tablets (650 mg total) by mouth every 6 (six) hours as needed for mild pain (pain score 1-3), moderate pain (pain score 4-6), fever or headache (or Fever >/= 101).     ALPRAZolam (XANAX) 1 MG tablet Take 1 tablet (1 mg total) by mouth 3 (three) times daily as needed for anxiety. 10 tablet 0   AMBULATORY NON FORMULARY MEDICATION Trimix (30/1/10)-(Pap/Phent/PGE)  Dosage: Inject 0.3 cc and may increase 0.1cc to  achieve and erection lasting no longer than 1 hour per injection   Vial 1ml  Qty #5 refills 2  [facility] [phone] Fax [phone] 5 mL 2   aspirin EC 325 MG tablet Take 325 mg by mouth daily.     baclofen (LIORESAL) 10 MG tablet Take 10 mg by mouth 2 (two) times daily.      bisacodyl (DULCOLAX) 10 MG suppository Place 1 suppository (10 mg total) rectally daily as needed for severe constipation.     bisacodyl (DULCOLAX) 5 MG EC tablet Take 2 tablets (10 mg total) by mouth at bedtime.     Continuous Glucose Sensor (FREESTYLE LIBRE 2 SENSOR) MISC      cyanocobalamin (VITAMIN B12) 500 MCG tablet Take 1 tablet (500 mcg total) by mouth daily. 30 tablet 2   JARDIANCE 25 MG TABS tablet Take 25 mg by mouth daily.     naloxone (NARCAN) 0.4 MG/ML injection Inject as directed as directed     NOVOLOG FLEXPEN 100 UNIT/ML FlexPen Inject 40 Units into the skin 3 (three) times daily with meals.     Nutritional Supplements (KETO PO) Take 1 capsule by mouth.     Oxycodone HCl 20 MG TABS Take 1 tablet by mouth 4 (four) times daily as needed.     polyethylene glycol (MIRALAX / GLYCOLAX) 17 g packet Take 17 g by mouth 2 (two) times  daily.     pregabalin (LYRICA) 75 MG capsule Take 1 capsule (75 mg total) by mouth every evening. 30 capsule 5   simvastatin (ZOCOR) 40 MG tablet Take 1 tablet (40 mg total) by mouth daily. 30 tablet 0   TOUJEO MAX SOLOSTAR 300 UNIT/ML Solostar Pen Inject 80-120 Units into the skin in the morning and at bedtime. Inject 80 Units into the skin daily. 80 units in the am and 120 units at bedtime     vitamin C (ASCORBIC ACID) 250 MG tablet Take 250 mg by mouth daily.     Vitamin D, Ergocalciferol, (DRISDOL) 1.25 MG (50000 UNIT) CAPS capsule Take 1 capsule (50,000 Units total) by mouth every 7 (seven) days. 12 capsule 0   No current facility-administered medications for this visit.     Abtx:  Anti-infectives (From admission, onward)    None       REVIEW OF SYSTEMS:  Const: negative fever, negative chills, negative weight loss, wheel chair bound Eyes: negative diplopia or visual changes, negative eye pain ENT: negative coryza, negative sore throat Resp: negative cough, hemoptysis, dyspnea Cards: negative for chest pain, palpitations, lower extremity edema GU: negative for frequency, dysuria and hematuria GI: Negative for abdominal pain, diarrhea, bleeding, constipation Skin: negative for rash and pruritus Heme: negative for easy bruising and gum/nose bleeding MS: left BKA Neurolo:negative for headaches, dizziness, vertigo, memory problems  Psych: anxiety, depression  Endocrine: , diabetes Allergy/Immunology- negative for any medication or food allergies ?  Objective:  VITALS:  BP (!) 140/88   Pulse 92    PHYSICAL EXAM:  General: Alert, cooperative, , in wheel chair. Lungs: b/l air entry Heart: Regular rate and rhythm, no murmur, rub or gallop. Abdomen: did not examine Extremities:  Rt foot dressing removed Rt heel ulcer smaller and shallower No macerstion around  Maceration of the skin around   4/10     Left BKA Skin: No rashes or lesions. Or bruising Lymph:  Cervical, supraclavicular normal. Neurologic: Grossly non-focal Pertinent Labs None     ? Impression/Recommendation ? MSSA bacteremia secondary to infected rt  heel ulcer with no osteomyelitis   treated with intravenous antibiotics for ten days, and discharged with long-acting weekly IV dalbavancin and oral cefadroxil. he had a missed a follow-up for the second intravenous antibiotic dose due to transportation issues. The wound vac was non -functional and had fallen off 06/04/23 He was non compliant with oral cefadroxil , I saw him last week whrn the wound was macerated- I set him to Day surgery and he recieevd a dsoe of dalbavancin 1500mg - labs were okay ( 4/10 ESR 7 and crp 1.5) HE missed his appt with podiatrist on 4/12 due to transportation Today the wound is looking better Will get his next dose of dalbavancin HE made a follow up with podiatrist on 4/21  I will see him on 06/18/23 and decide whether he will need a third dose ? _ DM on insulin and jardiance Not well controlled  Discussed the management with patient

## 2023-06-18 ENCOUNTER — Encounter: Payer: Self-pay | Admitting: Infectious Diseases

## 2023-06-18 ENCOUNTER — Ambulatory Visit: Admitting: Infectious Diseases

## 2023-06-18 ENCOUNTER — Ambulatory Visit: Attending: Infectious Diseases | Admitting: Infectious Diseases

## 2023-06-18 VITALS — BP 94/63 | HR 85 | Temp 97.5°F

## 2023-06-18 DIAGNOSIS — E1165 Type 2 diabetes mellitus with hyperglycemia: Secondary | ICD-10-CM | POA: Insufficient documentation

## 2023-06-18 DIAGNOSIS — L97419 Non-pressure chronic ulcer of right heel and midfoot with unspecified severity: Secondary | ICD-10-CM

## 2023-06-18 DIAGNOSIS — E11621 Type 2 diabetes mellitus with foot ulcer: Secondary | ICD-10-CM | POA: Insufficient documentation

## 2023-06-18 DIAGNOSIS — R7881 Bacteremia: Secondary | ICD-10-CM | POA: Diagnosis not present

## 2023-06-18 DIAGNOSIS — Z7985 Long-term (current) use of injectable non-insulin antidiabetic drugs: Secondary | ICD-10-CM | POA: Diagnosis not present

## 2023-06-18 DIAGNOSIS — Z794 Long term (current) use of insulin: Secondary | ICD-10-CM | POA: Insufficient documentation

## 2023-06-18 DIAGNOSIS — E1140 Type 2 diabetes mellitus with diabetic neuropathy, unspecified: Secondary | ICD-10-CM | POA: Insufficient documentation

## 2023-06-18 DIAGNOSIS — A4901 Methicillin susceptible Staphylococcus aureus infection, unspecified site: Secondary | ICD-10-CM

## 2023-06-18 DIAGNOSIS — Z89512 Acquired absence of left leg below knee: Secondary | ICD-10-CM | POA: Insufficient documentation

## 2023-06-18 DIAGNOSIS — B9561 Methicillin susceptible Staphylococcus aureus infection as the cause of diseases classified elsewhere: Secondary | ICD-10-CM | POA: Insufficient documentation

## 2023-06-18 DIAGNOSIS — L97418 Non-pressure chronic ulcer of right heel and midfoot with other specified severity: Secondary | ICD-10-CM | POA: Diagnosis not present

## 2023-06-18 DIAGNOSIS — E1151 Type 2 diabetes mellitus with diabetic peripheral angiopathy without gangrene: Secondary | ICD-10-CM | POA: Insufficient documentation

## 2023-06-18 DIAGNOSIS — Z7984 Long term (current) use of oral hypoglycemic drugs: Secondary | ICD-10-CM

## 2023-06-18 DIAGNOSIS — L089 Local infection of the skin and subcutaneous tissue, unspecified: Secondary | ICD-10-CM

## 2023-06-18 MED ORDER — CEFADROXIL 500 MG PO CAPS
500.0000 mg | ORAL_CAPSULE | Freq: Two times a day (BID) | ORAL | 0 refills | Status: AC
Start: 2023-06-18 — End: ?

## 2023-06-18 NOTE — Progress Notes (Signed)
 NAME: Corey Foster  DOB: 11-Jun-1955  MRN: [id number]  Date/Time: 06/18/2023 10:59 AM   Subjective:   ?pt here to follow up for rt foot infection and MSSA bacteremia  Last seen a week ago He missed a scheduled dressing change with his nurse yesterday due to a dental visit and has been catching up on missed medical appointments this week. Nurse visits were scheduled for every Tuesday and Thursday, but he has been unable to attend due to other appointments.  Corey Foster is a 68 y.o. 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  Culture of the wound was MSSA and GBS. MRI of the rt foot-  No evidence of osteomyelitis within the ankle or hindfoot. Mild reactive edema in the lateral plantar aspect of the calcaneal tuberosity.During his hospital stay, he received intravenous antibiotics and underwent a transesophageal echocardiogram (TEE), which showed no cardiac involvement. As he did not want PICC and home IV , he was given long acting dalbavancin on 05/21/23  and also sent home on cefadroxil  . Upon discharge, and was asked to come to day surgery  on 05/28/23  to get the 2nd and final dose which he missed- but since then he has received 2 doses of  IV dalbavancin 4/10 and 4/17.at the day surgery He has not had a dressing change in 1 week      Past Medical History:  Diagnosis Date   Arthritis    Diabetes mellitus without complication (HCC)    DJD (degenerative joint disease)    GERD (gastroesophageal reflux disease)    Hyperlipidemia    Hypertension    Sciatic nerve pain    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   Sleep apnea     Past Surgical History:  Procedure Laterality Date   AMPUTATION Left  11/29/2022   Procedure: AMPUTATION RAY, PARTIAL 1ST RAY;  Surgeon: Pink Bridges, DPM;  Location: [facility];  Service: Orthopedics/Podiatry;  Laterality: Left;   AMPUTATION Left 12/19/2022   Procedure: FIRST LEFT RAY AMPUTATION;  Surgeon: Pink Bridges, DPM;  Location: [facility];  Service: Orthopedics/Podiatry;  Laterality: Left;   AMPUTATION Left 01/09/2023   Procedure: AMPUTATION BELOW KNEE;  Surgeon: Celso College, MD;  Location: [facility];  Service: General;  Laterality: Left;   CYSTOSCOPY W/ RETROGRADES Bilateral 05/31/2019   Procedure: CYSTOSCOPY WITH RETROGRADE PYELOGRAM;  Surgeon: Geraline Knapp, MD;  Location: [facility];  Service: Urology;  Laterality: Bilateral;   LOWER EXTREMITY ANGIOGRAPHY Left 11/28/2022   Procedure: Lower Extremity Angiography;  Surgeon: Celso College, MD;  Location: [facility];  Service: Cardiovascular;  Laterality: Left;   LOWER EXTREMITY ANGIOGRAPHY Left 12/18/2022   Procedure: Lower Extremity Angiography;  Surgeon: Celso College, MD;  Location: [facility];  Service: Cardiovascular;  Laterality: Left;   LOWER EXTREMITY ANGIOGRAPHY Right 05/13/2023   Procedure: Lower Extremity Angiography;  Surgeon: Celso College, MD;  Location: [facility];  Service: Cardiovascular;  Laterality: Right;   TEE WITHOUT CARDIOVERSION N/A 05/19/2023   Procedure: ECHOCARDIOGRAM, TRANSESOPHAGEAL;  Surgeon: Devorah Fonder, MD;  Location: [facility];  Service: Cardiovascular;  Laterality: N/A;   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 -C  N/A 05/31/2019   Procedure: TRANSURETHRAL RESECTION OF BLADDER TUMOR WITH gemcitabine ;  Surgeon: Geraline Knapp, MD;  Location: [facility];  Service: Urology;  Laterality: N/A;    Social History   Socioeconomic History   Marital status: Single    Spouse name: Not on file   Number of children: Not on file   Years of education: Not on file   Highest education level: Not on file  Occupational History   Not on file   Tobacco Use   Smoking status: Every Day    Current packs/day: 0.25    Average packs/day: 0.3 packs/day for 40.0 years (10.0 ttl pk-yrs)    Types: Cigarettes   Smokeless tobacco: Never  Vaping Use   Vaping status: Never Used  Substance and Sexual Activity   Alcohol use: Yes    Alcohol/week: 18.0 standard drinks of alcohol    Types: 12 Cans of beer, 6 Shots of liquor per week   Drug use: Not Currently    Types: Cocaine, Marijuana   Sexual activity: Yes  Other Topics Concern   Not on file  Social History Narrative   Not on file   Social Drivers of Health   Financial Resource Strain: Not on file  Food Insecurity: No Food Insecurity (05/12/2023)   Hunger Vital Sign    Worried About Running Out of Food in the Last Year: Never true    Ran Out of Food in the Last Year: Never true  Transportation Needs: No Transportation Needs (05/12/2023)   PRAPARE - Administrator, Civil Service (Medical): No    Lack of Transportation (Non-Medical): No  Physical Activity: Not on file  Stress: Not on file  Social Connections: Unknown (05/12/2023)   Social Connection and Isolation Panel [NHANES]    Frequency of Communication with Friends and Family: More than three times a week    Frequency of Social Gatherings with Friends and Family: Three times a week    Attends Religious Services: 1 to 4 times per year    Active Member of Clubs or Organizations: No    Attends Banker Meetings: Never    Marital Status: Patient declined  Intimate Partner Violence: Not At Risk (05/12/2023)   Humiliation, Afraid, Rape, and Kick questionnaire    Fear of Current or Ex-Partner: No    Emotionally Abused: No    Physically Abused: No    Sexually Abused: No    Family History  Problem Relation Age of Onset   Diabetes Mother    Heart disease Mother    Diabetes Father    Heart disease Father    Diabetes Brother    No Known Allergies I? Current Outpatient Medications  Medication Sig  Dispense Refill   acetaminophen  (TYLENOL ) 325 MG tablet Take 2 tablets (650 mg total) by mouth every 6 (six) hours as needed for mild pain (pain score 1-3), moderate pain (pain score 4-6), fever or headache (or Fever >/= 101).     ALPRAZolam  (XANAX ) 1 MG tablet Take 1 tablet (1 mg total) by mouth 3 (three) times daily as needed for anxiety. 10 tablet 0   AMBULATORY NON FORMULARY MEDICATION Trimix (30/1/10)-(Pap/Phent/PGE)  Dosage: Inject 0.3 cc and may increase 0.1cc to achieve and erection lasting no longer than 1 hour per injection   Vial 1ml  Qty #5 refills 2  [facility] [phone] Fax [phone] 5 mL 2   aspirin  EC 325 MG tablet Take 325 mg by mouth daily.  baclofen  (LIORESAL ) 10 MG tablet Take 10 mg by mouth 2 (two) times daily.      bisacodyl  (DULCOLAX) 10 MG suppository Place 1 suppository (10 mg total) rectally daily as needed for severe constipation.     bisacodyl  (DULCOLAX) 5 MG EC tablet Take 2 tablets (10 mg total) by mouth at bedtime.     cefadroxil  (DURICEF) 500 MG capsule Take 1 capsule (500 mg total) by mouth 2 (two) times daily. 14 capsule 0   Continuous Glucose Sensor (FREESTYLE LIBRE 2 SENSOR) MISC      cyanocobalamin  (VITAMIN B12) 500 MCG tablet Take 1 tablet (500 mcg total) by mouth daily. 30 tablet 2   JARDIANCE  25 MG TABS tablet Take 25 mg by mouth daily.     naloxone (NARCAN) 0.4 MG/ML injection Inject as directed as directed     NOVOLOG  FLEXPEN 100 UNIT/ML FlexPen Inject 40 Units into the skin 3 (three) times daily with meals.     Nutritional Supplements (KETO PO) Take 1 capsule by mouth.     Oxycodone  HCl 20 MG TABS Take 1 tablet by mouth 4 (four) times daily as needed.     polyethylene glycol (MIRALAX  / GLYCOLAX ) 17 g packet Take 17 g by mouth 2 (two) times daily.     pregabalin  (LYRICA ) 75 MG capsule Take 1 capsule (75 mg total) by mouth every evening. 30 capsule 5   simvastatin  (ZOCOR ) 40 MG tablet Take 1 tablet (40 mg total) by mouth daily.  30 tablet 0   TOUJEO  MAX SOLOSTAR 300 UNIT/ML Solostar Pen Inject 80-120 Units into the skin in the morning and at bedtime. Inject 80 Units into the skin daily. 80 units in the am and 120 units at bedtime     vitamin C  (ASCORBIC ACID ) 250 MG tablet Take 250 mg by mouth daily.     Vitamin D , Ergocalciferol , (DRISDOL ) 1.25 MG (50000 UNIT) CAPS capsule Take 1 capsule (50,000 Units total) by mouth every 7 (seven) days. 12 capsule 0   No current facility-administered medications for this visit.     Abtx:  Anti-infectives (From admission, onward)    Start     Dose/Rate Route Frequency Ordered Stop   06/18/23 0000  cefadroxil  (DURICEF) 500 MG capsule        500 mg Oral 2 times daily 06/18/23 1055         REVIEW OF SYSTEMS:  Const: negative fever, negative chills, negative weight loss, wheel chair bound Eyes: negative diplopia or visual changes, negative eye pain ENT: negative coryza, negative sore throat Resp: negative cough, hemoptysis, dyspnea Cards: negative for chest pain, palpitations, lower extremity edema GU: negative for frequency, dysuria and hematuria GI: Negative for abdominal pain, diarrhea, bleeding, constipation Skin: negative for rash and pruritus Heme: negative for easy bruising and gum/nose bleeding MS: left BKA Neurolo:negative for headaches, dizziness, vertigo, memory problems  Psych: anxiety, depression  Endocrine: , diabetes Allergy/Immunology- negative for any medication or food allergies ?  Objective:  VITALS:  BP 94/63   Pulse 85   Temp (!) 97.5 F (36.4 C) (Temporal)   SpO2 97%    PHYSICAL EXAM:  General: Alert, cooperative, , in wheel chair.unkempt Lungs: b/l air entry Heart: Regular rate and rhythm, no murmur, rub or gallop. Abdomen: did not examine Extremities:  Rt foot dressing removed Rt heel ulcer smaller and shallower 06/18/23   06/11/23  Maceration of the skin around   4/10     Left BKA Skin: No rashes or lesions. Or  bruising Lymph: Cervical, supraclavicular normal. Neurologic: Grossly non-focal Pertinent Labs 06/04/23    Latest Ref Rng & Units 06/04/2023   10:29 AM 05/21/2023    4:02 AM 05/20/2023    5:05 AM  CBC  WBC 4.0 - 10.5 K/uL 8.1  6.0  5.2   Hemoglobin 13.0 - 17.0 g/dL 13.0  86.5  78.4   Hematocrit 39.0 - 52.0 % 47.4  44.3  45.7   Platelets 150 - 400 K/uL 211  178  188        Latest Ref Rng & Units 06/04/2023   10:29 AM 05/21/2023    4:02 AM 05/20/2023    5:05 AM  CMP  Glucose 70 - 99 mg/dL 696  295  284   BUN 8 - 23 mg/dL 21  22  21    Creatinine 0.61 - 1.24 mg/dL 1.32  4.40  1.02   Sodium 135 - 145 mmol/L 139  132  135   Potassium 3.5 - 5.1 mmol/L 4.1  4.4  4.6   Chloride 98 - 111 mmol/L 110  99  103   CO2 22 - 32 mmol/L 21  26  26    Calcium  8.9 - 10.3 mg/dL 8.5  8.3  8.6   Total Protein 6.5 - 8.1 g/dL 7.1     Total Bilirubin 0.0 - 1.2 mg/dL 0.2     Alkaline Phos 38 - 126 U/L 61     AST 15 - 41 U/L 12     ALT 0 - 44 U/L 24       Erythrocyte Sedimentation Rate     Component Value Date/Time   ESRSEDRATE 7 06/04/2023 1029       Impression/Recommendation ? MSSA bacteremia secondary to infected rt heel ulcer with no osteomyelitis   treated with intravenous antibiotics for ten days, and discharged with long-acting weekly IV dalbavancin and oral cefadroxil . he had a missed a follow-up for the second intravenous antibiotic dose due to transportation issues. The wound vac was non -functional and had fallen off 06/04/23 He was non compliant with oral cefadroxil  , I saw him on 4/10 and  wound was macerated due to wound vac not being changed - I sent him to Day surgery and he recieevd a dsoe of dalbavancin 1500mg - labs were okay ( 4/10 ESR 7 and crp 1.5) HE missed his appt with podiatrist on 4/12 due to transportation HE saw me on 4/17 and got 2nd dose of dalbavancin The wound is looking so much better- smaller  HE does not need any more IV dalbavancin HE has an appt with Dr.Baker on  06/22/23 Monday Will give him PO cefadroxil  500mg  BID for a week and Dr.Baker can decide if he needs more' ? _ DM on insulin  and jardiance  Not well controlled  Discussed the management with patient Follow PRN with me

## 2023-06-18 NOTE — Patient Instructions (Signed)
 ou came in today for a follow-up on your foot ulcer. We discussed your recent missed appointments and reviewed your current medications and health status. Your foot ulcer is healing well, and we have adjusted your medications accordingly. We also reviewed your diabetes management and other health concerns.  YOUR PLAN:  -FOOT ULCER DUE TO STAPHYLOCOCCUS AUREUS INFECTION: A foot ulcer is an open sore that can become infected. Your ulcer, previously complicated by a Staphylococcus aureus infection, is now healing well. You will take cefadroxil  500 mg, one capsule in the morning and one in the evening for 7 days. Please ensure you follow up with your foot doctor on Monday at 2:30 PM.  -DIABETES WITH DIABETIC NEUROPATHY: Diabetes is a condition where your blood sugar levels are too high, and diabetic neuropathy is nerve damage caused by diabetes. Your blood glucose levels have improved but are still slightly elevated. Continue taking your current diabetes medications: long-acting insulin , short-acting insulin , Trulicity, and Jardiance . Monitor your blood glucose levels closely and maintain your dietary management.  -BELOW KNEE AMPUTATION: You have a left below-knee amputation due to poor circulation, which is important for managing your foot ulcer and overall mobility. No new treatment is needed at this time.  -BLADDER CANCER: You have undergone procedures for bladder cancer. There are no current issues related to bladder cancer that need to be addressed at this time.  INSTRUCTIONS:  Please follow up with your foot doctor on Monday, June 22, 2023, at 2:30 PM. Continue taking your medications as prescribed and monitor your blood glucose levels closely. If you have any concerns or notice any changes in your health, please contact our office.

## 2023-06-19 ENCOUNTER — Encounter (INDEPENDENT_AMBULATORY_CARE_PROVIDER_SITE_OTHER): Payer: Self-pay | Admitting: Vascular Surgery

## 2023-06-19 ENCOUNTER — Ambulatory Visit (INDEPENDENT_AMBULATORY_CARE_PROVIDER_SITE_OTHER): Admitting: Vascular Surgery

## 2023-06-19 ENCOUNTER — Ambulatory Visit (INDEPENDENT_AMBULATORY_CARE_PROVIDER_SITE_OTHER)

## 2023-06-19 VITALS — BP 144/94 | HR 89 | Resp 18 | Ht 71.0 in | Wt 242.0 lb

## 2023-06-19 DIAGNOSIS — E11621 Type 2 diabetes mellitus with foot ulcer: Secondary | ICD-10-CM

## 2023-06-19 DIAGNOSIS — I7025 Atherosclerosis of native arteries of other extremities with ulceration: Secondary | ICD-10-CM

## 2023-06-19 DIAGNOSIS — I70222 Atherosclerosis of native arteries of extremities with rest pain, left leg: Secondary | ICD-10-CM

## 2023-06-19 DIAGNOSIS — I10 Essential (primary) hypertension: Secondary | ICD-10-CM | POA: Diagnosis not present

## 2023-06-19 DIAGNOSIS — Z89512 Acquired absence of left leg below knee: Secondary | ICD-10-CM

## 2023-06-19 DIAGNOSIS — L97509 Non-pressure chronic ulcer of other part of unspecified foot with unspecified severity: Secondary | ICD-10-CM

## 2023-06-19 DIAGNOSIS — E1152 Type 2 diabetes mellitus with diabetic peripheral angiopathy with gangrene: Secondary | ICD-10-CM

## 2023-06-19 NOTE — Assessment & Plan Note (Signed)
 blood pressure control important in reducing the progression of atherosclerotic disease. On appropriate oral medications.

## 2023-06-19 NOTE — Assessment & Plan Note (Signed)
 Referral to [facility]

## 2023-06-19 NOTE — Assessment & Plan Note (Signed)
 ABIs were done today on the right side showed noncompressible ABIs but good posterior tibial waveform which was triphasic and a digital pressure of 82 which should be adequate for wound healing. Continue local wound care.  We will keep a close interval follow-up of about 3 months with ABIs. He has already lost the left leg from ulceration and malperfusion.  His wound is now healed.  We will make a referral to the [facility] for prosthetic evaluation.

## 2023-06-19 NOTE — Assessment & Plan Note (Signed)
 blood glucose control important in reducing the progression of atherosclerotic disease. Also, involved in wound healing. On appropriate medications.

## 2023-06-19 NOTE — Progress Notes (Signed)
 MRN : [id number]  Corey Foster is a 68 y.o. ([birth year]) male who presents with chief complaint of  Chief Complaint  Patient presents with   Follow-up    fu 3wk + ABI post angio  .  History of Present Illness: Patient returns today in follow up of his right foot ulceration, PAD, and left below-knee amputation.  He continues to follow with the wound care center for his right foot and this is getting better.  He still has pain but he has chronic pain issues previously.  He had an angiogram showing reasonably good flow and no intervention was performed on the right leg.  He had a left below-knee amputation about 4 to 5 months ago now.  This is well-healed and he is interested in getting a prosthesis.  ABIs were done today on the right side showed noncompressible ABIs but good posterior tibial waveform which was triphasic and a digital pressure of 82 which should be adequate for wound healing.  Current Outpatient Medications  Medication Sig Dispense Refill   acetaminophen  (TYLENOL ) 325 MG tablet Take 2 tablets (650 mg total) by mouth every 6 (six) hours as needed for mild pain (pain score 1-3), moderate pain (pain score 4-6), fever or headache (or Fever >/= 101).     ALPRAZolam  (XANAX ) 1 MG tablet Take 1 tablet (1 mg total) by mouth 3 (three) times daily as needed for anxiety. 10 tablet 0   AMBULATORY NON FORMULARY MEDICATION Trimix (30/1/10)-(Pap/Phent/PGE)  Dosage: Inject 0.3 cc and may increase 0.1cc to achieve and erection lasting no longer than 1 hour per injection   Vial 1ml  Qty #5 refills 2  [facility] [phone] Fax [phone] 5 mL 2   aspirin  EC 325 MG tablet Take 325 mg by mouth daily.     baclofen  (LIORESAL ) 10 MG tablet Take 10 mg by mouth 2 (two) times daily.      bisacodyl  (DULCOLAX) 10 MG suppository Place 1 suppository (10 mg total) rectally daily as needed for severe constipation.     bisacodyl  (DULCOLAX) 5 MG EC tablet Take 2 tablets (10 mg total) by  mouth at bedtime.     cefadroxil  (DURICEF) 500 MG capsule Take 1 capsule (500 mg total) by mouth 2 (two) times daily. 14 capsule 0   Continuous Glucose Sensor (FREESTYLE LIBRE 2 SENSOR) MISC      cyanocobalamin  (VITAMIN B12) 500 MCG tablet Take 1 tablet (500 mcg total) by mouth daily. 30 tablet 2   JARDIANCE  25 MG TABS tablet Take 25 mg by mouth daily.     naloxone (NARCAN) 0.4 MG/ML injection Inject as directed as directed     NOVOLOG  FLEXPEN 100 UNIT/ML FlexPen Inject 40 Units into the skin 3 (three) times daily with meals.     Nutritional Supplements (KETO PO) Take 1 capsule by mouth.     Oxycodone  HCl 20 MG TABS Take 1 tablet by mouth 4 (four) times daily as needed.     polyethylene glycol (MIRALAX  / GLYCOLAX ) 17 g packet Take 17 g by mouth 2 (two) times daily.     pregabalin  (LYRICA ) 75 MG capsule Take 1 capsule (75 mg total) by mouth every evening. 30 capsule 5   simvastatin  (ZOCOR ) 40 MG tablet Take 1 tablet (40 mg total) by mouth daily. 30 tablet 0   TOUJEO  MAX SOLOSTAR 300 UNIT/ML Solostar Pen Inject 80-120 Units into the skin in the morning and at bedtime. Inject 80 Units into the skin daily. 80 units  in the am and 120 units at bedtime     vitamin C  (ASCORBIC ACID ) 250 MG tablet Take 250 mg by mouth daily.     Vitamin D , Ergocalciferol , (DRISDOL ) 1.25 MG (50000 UNIT) CAPS capsule Take 1 capsule (50,000 Units total) by mouth every 7 (seven) days. 12 capsule 0   No current facility-administered medications for this visit.    Past Medical History:  Diagnosis Date   Arthritis    Diabetes mellitus without complication (HCC)    DJD (degenerative joint disease)    GERD (gastroesophageal reflux disease)    Hyperlipidemia    Hypertension    Sciatic nerve pain    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   Sleep apnea     Past Surgical History:  Procedure Laterality Date   AMPUTATION Left 11/29/2022   Procedure: AMPUTATION RAY, PARTIAL 1ST RAY;  Surgeon: Pink Bridges, DPM;  Location: [facility]  ORS;  Service: Orthopedics/Podiatry;  Laterality: Left;   AMPUTATION Left 12/19/2022   Procedure: FIRST LEFT RAY AMPUTATION;  Surgeon: Pink Bridges, DPM;  Location: [facility] ORS;  Service: Orthopedics/Podiatry;  Laterality: Left;   AMPUTATION Left 01/09/2023   Procedure: AMPUTATION BELOW KNEE;  Surgeon: Celso College, MD;  Location: [facility] ORS;  Service: General;  Laterality: Left;   CYSTOSCOPY W/ RETROGRADES Bilateral 05/31/2019   Procedure: CYSTOSCOPY WITH RETROGRADE PYELOGRAM;  Surgeon: Geraline Knapp, MD;  Location: [facility] ORS;  Service: Urology;  Laterality: Bilateral;   LOWER EXTREMITY ANGIOGRAPHY Left 11/28/2022   Procedure: Lower Extremity Angiography;  Surgeon: Celso College, MD;  Location: [facility] INVASIVE [facility] LAB;  Service: Cardiovascular;  Laterality: Left;   LOWER EXTREMITY ANGIOGRAPHY Left 12/18/2022   Procedure: Lower Extremity Angiography;  Surgeon: Celso College, MD;  Location: [facility];  Service: Cardiovascular;  Laterality: Left;   LOWER EXTREMITY ANGIOGRAPHY Right 05/13/2023   Procedure: Lower Extremity Angiography;  Surgeon: Celso College, MD;  Location: [facility];  Service: Cardiovascular;  Laterality: Right;   TEE WITHOUT CARDIOVERSION N/A 05/19/2023   Procedure: ECHOCARDIOGRAM, TRANSESOPHAGEAL;  Surgeon: Devorah Fonder, MD;  Location: [facility] ORS;  Service: Cardiovascular;  Laterality: N/A;   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 -C N/A 05/31/2019   Procedure: TRANSURETHRAL RESECTION OF BLADDER TUMOR WITH gemcitabine ;  Surgeon: Geraline Knapp, MD;  Location: [facility] ORS;  Service: Urology;  Laterality: N/A;     Social History   Tobacco Use   Smoking status: Every Day    Current packs/day: 0.25    Average packs/day: 0.3 packs/day for 40.0 years (10.0 ttl pk-yrs)    Types: Cigarettes   Smokeless tobacco: Never  Vaping Use   Vaping status: Never Used  Substance Use Topics   Alcohol use: Yes    Alcohol/week: 18.0 standard drinks of alcohol     Types: 12 Cans of beer, 6 Shots of liquor per week   Drug use: Not Currently    Types: Cocaine, Marijuana       Family History  Problem Relation Age of Onset   Diabetes Mother    Heart disease Mother    Diabetes Father    Heart disease Father    Diabetes Brother      No Known Allergies   REVIEW OF SYSTEMS (Negative unless checked)  Constitutional: [] Weight loss  [] Fever  [] Chills Cardiac: [] Chest pain   [] Chest pressure   [] Palpitations   [] Shortness of breath when laying flat   [] Shortness of breath at rest   [] Shortness of breath with exertion. Vascular:  [  x]Pain in legs with walking   [x] Pain in legs at rest   [] Pain in legs when laying flat   [] Claudication   [] Pain in feet when walking  [] Pain in feet at rest  [] Pain in feet when laying flat   [] History of DVT   [] Phlebitis   [x] Swelling in legs   [] Varicose veins   [x] Non-healing ulcers Pulmonary:   [] Uses home oxygen   [] Productive cough   [] Hemoptysis   [] Wheeze  [] COPD   [] Asthma Neurologic:  [] Dizziness  [] Blackouts   [] Seizures   [] History of stroke   [] History of TIA  [] Aphasia   [] Temporary blindness   [] Dysphagia   [] Weakness or numbness in arms   [] Weakness or numbness in legs Musculoskeletal:  [x] Arthritis   [] Joint swelling   [x] Joint pain   [] Low back pain Hematologic:  [] Easy bruising  [] Easy bleeding   [] Hypercoagulable state   [] Anemic   Gastrointestinal:  [] Blood in stool   [] Vomiting blood  [] Gastroesophageal reflux/heartburn   [] Abdominal pain Genitourinary:  [] Chronic kidney disease   [] Difficult urination  [] Frequent urination  [] Burning with urination   [] Hematuria Skin:  [] Rashes   [] Ulcers   [] Wounds Psychological:  [] History of anxiety   []  History of major depression.  Physical Examination  BP (!) 144/94   Pulse 89   Resp 18   Ht 5\' 11"  (1.803 m)   Wt 242 lb (109.8 kg)   BMI 33.75 kg/m  Gen:  WD/WN, NAD Head: [place]/AT, No temporalis wasting. Ear/Nose/Throat: Hearing grossly intact, nares w/o  erythema or drainage Eyes: Conjunctiva clear. Sclera non-icteric Neck: Supple.  Trachea midline Pulmonary:  Good air movement, no use of accessory muscles.  Cardiac: RRR, no JVD Vascular:  Vessel Right Left  Radial Palpable Palpable                          PT 1+ Palpable Not Palpable  DP 1+ Palpable Not Palpable   Gastrointestinal: soft, non-tender/non-distended. No guarding/reflex.  Musculoskeletal: M/S 5/5 throughout.  Left BKA is well-healed.  Right foot wounds currently dressed.  1+ right lower extremity edema. Neurologic: Sensation grossly intact in extremities.  Symmetrical.  Speech is fluent.  Psychiatric: Judgment intact, Mood & affect appropriate for pt's clinical situation. Dermatologic: No rashes or ulcers noted.  No cellulitis or open wounds.      Labs Recent Results (from the past 2160 hours)  Aerobic culture     Status: Abnormal   Collection Time: 03/23/23 11:24 AM  Result Value Ref Range   Aerobic Bacterial Culture Final report (A)    Organism ID, Bacteria Comment (A)     Comment: Methicillin - resistant Staphylococcus aureus Based on resistance to oxacillin this isolate would be resistant to all currently available beta-lactam antimicrobial agents, with the exception of the newer cephalosporins with anti-MRSA activity, such as Ceftaroline Heavy growth    Antimicrobial Susceptibility Comment     Comment:       ** S = Susceptible; I = Intermediate; R = Resistant **                    P = Positive; N = Negative             MICS are expressed in micrograms per mL    Antibiotic                 RSLT#1    RSLT#2    RSLT#3    RSLT#4  Ciprofloxacin                  S Clindamycin                     R Erythromycin                   R Gentamicin                     S Levofloxacin                   S Linezolid                      S Oxacillin                      R Penicillin                     R Rifampin                       S Tetracycline                    S Trimethoprim /Sulfa              S Vancomycin                      S   Comprehensive metabolic panel     Status: Abnormal   Collection Time: 05/11/23  5:12 PM  Result Value Ref Range   Sodium 135 135 - 145 mmol/L   Potassium 4.6 3.5 - 5.1 mmol/L   Chloride 110 98 - 111 mmol/L   CO2 17 (L) 22 - 32 mmol/L   Glucose, Bld 183 (H) 70 - 99 mg/dL    Comment: Glucose reference range applies only to samples taken after fasting for at least 8 hours.   BUN 29 (H) 8 - 23 mg/dL   Creatinine, Ser 1.61 0.61 - 1.24 mg/dL   Calcium  8.5 (L) 8.9 - 10.3 mg/dL   Total Protein 7.1 6.5 - 8.1 g/dL   Albumin 3.1 (L) 3.5 - 5.0 g/dL   AST 10 (L) 15 - 41 U/L   ALT 20 0 - 44 U/L   Alkaline Phosphatase 58 38 - 126 U/L   Total Bilirubin 0.5 0.0 - 1.2 mg/dL   GFR, Estimated >09 >60 mL/min    Comment: (NOTE) Calculated using the CKD-EPI Creatinine Equation (2021)    Anion gap 8 5 - 15    Comment: Performed at [facility], [address] [place], [place] [address]  CBC with Differential     Status: Abnormal   Collection Time: 05/11/23  5:12 PM  Result Value Ref Range   WBC 7.3 4.0 - 10.5 K/uL   RBC 5.45 4.22 - 5.81 MIL/uL   Hemoglobin 17.1 (H) 13.0 - 17.0 g/dL   HCT 81.1 91.4 - 78.2 %   MCV 93.2 80.0 - 100.0 fL   MCH 31.4 26.0 - 34.0 pg   MCHC 33.7 30.0 - 36.0 g/dL   RDW 95.6 21.3 - 08.6 %   Platelets 188 150 - 400 K/uL   nRBC 0.0 0.0 - 0.2 %   Neutrophils Relative % 71 %   Neutro Abs 5.2 1.7 - 7.7 K/uL   Lymphocytes Relative 18 %   Lymphs Abs 1.3 0.7 - 4.0 K/uL   Monocytes Relative  10 %   Monocytes Absolute 0.8 0.1 - 1.0 K/uL   Eosinophils Relative 1 %   Eosinophils Absolute 0.1 0.0 - 0.5 K/uL   Basophils Relative 0 %   Basophils Absolute 0.0 0.0 - 0.1 K/uL   Immature Granulocytes 0 %   Abs Immature Granulocytes 0.03 0.00 - 0.07 K/uL    Comment: Performed at [facility], [address] [place], [place] [address]  Lactic acid, plasma     Status: None   Collection Time:  05/11/23  5:13 PM  Result Value Ref Range   Lactic Acid, Venous 0.8 0.5 - 1.9 mmol/L    Comment: Performed at [facility], [address] [place], [place] [address]  Culture, blood (routine x 2)     Status: Abnormal   Collection Time: 05/12/23  1:42 AM   Specimen: BLOOD  Result Value Ref Range   Specimen Description      BLOOD LAC Performed at [facility], [address] [place], [place] [address]    Special Requests      BOTTLES DRAWN AEROBIC AND ANAEROBIC Blood Culture results may not be optimal due to an inadequate volume of blood received in culture bottles Performed at [facility], [address] [place], [place] [address]    Culture  Setup Time      GRAM POSITIVE COCCI IN BOTH AEROBIC AND ANAEROBIC BOTTLES CRITICAL RESULT CALLED TO, READ BACK BY AND VERIFIED WITH: ANDREA DOBBS AT 1905 ON 05/12/23 BY SS    Culture STAPHYLOCOCCUS AUREUS (A)    Report Status 05/14/2023 FINAL    Organism ID, Bacteria STAPHYLOCOCCUS AUREUS       Susceptibility   Staphylococcus aureus - MIC*    CIPROFLOXACIN <=0.5 SENSITIVE Sensitive     ERYTHROMYCIN >=8 RESISTANT Resistant     GENTAMICIN <=0.5 SENSITIVE Sensitive     OXACILLIN <=0.25 SENSITIVE Sensitive     TETRACYCLINE <=1 SENSITIVE Sensitive     VANCOMYCIN  1 SENSITIVE Sensitive     TRIMETH /SULFA  <=10 SENSITIVE Sensitive     CLINDAMYCIN  RESISTANT Resistant     RIFAMPIN <=0.5 SENSITIVE Sensitive     Inducible Clindamycin  POSITIVE Resistant     LINEZOLID 2 SENSITIVE Sensitive     * STAPHYLOCOCCUS AUREUS  Aerobic/Anaerobic Culture w Gram Stain (surgical/deep wound)     Status: None   Collection Time: 05/12/23  1:42 AM   Specimen: Heel  Result Value Ref Range   Specimen Description      HEEL Performed at [facility], [address], [place], [place] [address]    Special Requests      NONE Performed at [facility], [address] [place], [place] [address]    Gram Stain       FEW WBC PRESENT, PREDOMINANTLY MONONUCLEAR FEW GRAM POSITIVE COCCI IN PAIRS IN SINGLES    Culture      MODERATE GROUP B STREP(S.AGALACTIAE)ISOLATED TESTING AGAINST S. AGALACTIAE NOT ROUTINELY PERFORMED DUE TO PREDICTABILITY OF AMP/PEN/VAN SUSCEPTIBILITY. FEW STAPHYLOCOCCUS AUREUS NO ANAEROBES ISOLATED Performed at [facility], [address], [place], [place] [address]    Report Status 05/17/2023 FINAL    Organism ID, Bacteria STAPHYLOCOCCUS AUREUS       Susceptibility   Staphylococcus aureus - MIC*    CIPROFLOXACIN <=0.5 SENSITIVE Sensitive     ERYTHROMYCIN RESISTANT Resistant     GENTAMICIN <=0.5 SENSITIVE Sensitive     OXACILLIN 0.5 SENSITIVE Sensitive     TETRACYCLINE <=1 SENSITIVE Sensitive  VANCOMYCIN  <=0.5 SENSITIVE Sensitive     TRIMETH /SULFA  <=10 SENSITIVE Sensitive     CLINDAMYCIN  RESISTANT Resistant     RIFAMPIN <=0.5 SENSITIVE Sensitive     Inducible Clindamycin  POSITIVE Resistant     LINEZOLID 4 SENSITIVE Sensitive     * FEW STAPHYLOCOCCUS AUREUS  Blood Culture ID Panel (Reflexed)     Status: Abnormal   Collection Time: 05/12/23  1:42 AM  Result Value Ref Range   Enterococcus faecalis NOT DETECTED NOT DETECTED   Enterococcus Faecium NOT DETECTED NOT DETECTED   Listeria monocytogenes NOT DETECTED NOT DETECTED   Staphylococcus species DETECTED (A) NOT DETECTED    Comment: CRITICAL RESULT CALLED TO, READ BACK BY AND VERIFIED WITH: ANDREA DOBBS AT 1905 ON 05/12/23 BY SS    Staphylococcus aureus (BCID) DETECTED (A) NOT DETECTED    Comment: CRITICAL RESULT CALLED TO, READ BACK BY AND VERIFIED WITH: ANDREA DOBBS AT 1905 ON 05/12/23 BY SS    Staphylococcus epidermidis NOT DETECTED NOT DETECTED   Staphylococcus lugdunensis NOT DETECTED NOT DETECTED   Streptococcus species NOT DETECTED NOT DETECTED   Streptococcus agalactiae NOT DETECTED NOT DETECTED   Streptococcus pneumoniae NOT DETECTED NOT DETECTED   Streptococcus pyogenes NOT DETECTED NOT DETECTED    A.calcoaceticus-baumannii NOT DETECTED NOT DETECTED   Bacteroides fragilis NOT DETECTED NOT DETECTED   Enterobacterales NOT DETECTED NOT DETECTED   Enterobacter cloacae complex NOT DETECTED NOT DETECTED   Escherichia coli NOT DETECTED NOT DETECTED   Klebsiella aerogenes NOT DETECTED NOT DETECTED   Klebsiella oxytoca NOT DETECTED NOT DETECTED   Klebsiella pneumoniae NOT DETECTED NOT DETECTED   Proteus species NOT DETECTED NOT DETECTED   Salmonella species NOT DETECTED NOT DETECTED   Serratia marcescens NOT DETECTED NOT DETECTED   Haemophilus influenzae NOT DETECTED NOT DETECTED   Neisseria meningitidis NOT DETECTED NOT DETECTED   Pseudomonas aeruginosa NOT DETECTED NOT DETECTED   Stenotrophomonas maltophilia NOT DETECTED NOT DETECTED   Candida albicans NOT DETECTED NOT DETECTED   Candida auris NOT DETECTED NOT DETECTED   Candida glabrata NOT DETECTED NOT DETECTED   Candida krusei NOT DETECTED NOT DETECTED   Candida parapsilosis NOT DETECTED NOT DETECTED   Candida tropicalis NOT DETECTED NOT DETECTED   Cryptococcus neoformans/gattii NOT DETECTED NOT DETECTED   Meth resistant mecA/C and MREJ NOT DETECTED NOT DETECTED    Comment: Performed at [facility], [address] [place], [place] [address]  Culture, blood (routine x 2)     Status: None   Collection Time: 05/12/23  3:00 AM   Specimen: BLOOD  Result Value Ref Range   Specimen Description BLOOD LEFT HAND    Special Requests      BOTTLES DRAWN AEROBIC ONLY Blood Culture adequate volume   Culture      NO GROWTH 5 DAYS Performed at [facility], [address] [place], [place] [address]    Report Status 05/17/2023 FINAL   Sedimentation rate     Status: None   Collection Time: 05/12/23  4:29 AM  Result Value Ref Range   Sed Rate 5 0 - 20 mm/hr    Comment: Performed at [facility], [address] [place], [place] [address]  C-reactive protein     Status: None   Collection Time: 05/12/23  4:29 AM   Result Value Ref Range   CRP 0.6 <1.0 mg/dL    Comment: Performed at [facility], [address] [place], [place] [address]  Prealbumin     Status:  None   Collection Time: 05/12/23  4:29 AM  Result Value Ref Range   Prealbumin 21 18 - 38 mg/dL    Comment: Performed at [facility], [address] [place], [place] [address]  CBG monitoring, ED     Status: Abnormal   Collection Time: 05/12/23  4:29 AM  Result Value Ref Range   Glucose-Capillary 116 (H) 70 - 99 mg/dL    Comment: Glucose reference range applies only to samples taken after fasting for at least 8 hours.  Vitamin B12     Status: None   Collection Time: 05/12/23  4:29 AM  Result Value Ref Range   Vitamin B-12 393 180 - 914 pg/mL    Comment: (NOTE) This assay is not validated for testing neonatal or myeloproliferative syndrome specimens for Vitamin B12 levels. Performed at [facility], [address] [place], [place] [address]   VITAMIN D  25 Hydroxy (Vit-D Deficiency, Fractures)     Status: None   Collection Time: 05/12/23  4:29 AM  Result Value Ref Range   Vit D, 25-Hydroxy 31.14 30 - 100 ng/mL    Comment: (NOTE) Vitamin D  deficiency has been defined by the Institute of Medicine  and an Endocrine Society practice guideline as a level of serum 25-OH  vitamin D  less than 20 ng/mL (1,2). The Endocrine Society went on to  further define vitamin D  insufficiency as a level between 21 and 29  ng/mL (2).  1. IOM (Institute of Medicine). 2010. Dietary reference intakes for  calcium  and D. [place]: [employer]. 2. Holick MF, Binkley [place], Bischoff-Ferrari HA, et al. Evaluation,  treatment, and prevention of vitamin D  deficiency: an Endocrine  Society clinical practice guideline, JCEM. 2011 Jul; 96(7): 1911-30.  Performed at [facility], [address] [place], [place] [address]   CBG monitoring, ED     Status: Abnormal   Collection Time: 05/12/23  7:17 AM  Result Value  Ref Range   Glucose-Capillary 145 (H) 70 - 99 mg/dL    Comment: Glucose reference range applies only to samples taken after fasting for at least 8 hours.  Basic metabolic panel     Status: Abnormal   Collection Time: 05/12/23  8:46 AM  Result Value Ref Range   Sodium 134 (L) 135 - 145 mmol/L   Potassium 4.2 3.5 - 5.1 mmol/L   Chloride 108 98 - 111 mmol/L   CO2 22 22 - 32 mmol/L   Glucose, Bld 117 (H) 70 - 99 mg/dL    Comment: Glucose reference range applies only to samples taken after fasting for at least 8 hours.   BUN 23 8 - 23 mg/dL   Creatinine, Ser 6.96 0.61 - 1.24 mg/dL   Calcium  8.4 (L) 8.9 - 10.3 mg/dL   GFR, Estimated >29 >52 mL/min    Comment: (NOTE) Calculated using the CKD-EPI Creatinine Equation (2021)    Anion gap 4 (L) 5 - 15    Comment: Performed at [facility], [address] [place], [place] [address]  CBC     Status: None   Collection Time: 05/12/23  8:46 AM  Result Value Ref Range   WBC 6.3 4.0 - 10.5 K/uL   RBC 5.12 4.22 - 5.81 MIL/uL   Hemoglobin 16.1 13.0 - 17.0 g/dL   HCT 44.0 10.2 - 72.5 %   MCV 91.8 80.0 - 100.0 fL   MCH 31.4 26.0 - 34.0 pg   MCHC 34.3 30.0 -  36.0 g/dL   RDW 16.1 09.6 - 04.5 %   Platelets 156 150 - 400 K/uL   nRBC 0.0 0.0 - 0.2 %    Comment: Performed at [facility], [address] [place], [place] [address]  Magnesium     Status: None   Collection Time: 05/12/23  8:46 AM  Result Value Ref Range   Magnesium 2.2 1.7 - 2.4 mg/dL    Comment: Performed at [facility], [address] [place], [place] [address]  Phosphorus     Status: None   Collection Time: 05/12/23  8:46 AM  Result Value Ref Range   Phosphorus 3.1 2.5 - 4.6 mg/dL    Comment: Performed at [facility], [address] [place] [address] [place], [place] [address]  CBG monitoring, ED     Status: Abnormal   Collection Time: 05/12/23  2:03 PM  Result Value Ref Range   Glucose-Capillary 223 (H) 70 - 99 mg/dL    Comment: Glucose reference  range applies only to samples taken after fasting for at least 8 hours.  Glucose, capillary     Status: Abnormal   Collection Time: 05/12/23  4:01 PM  Result Value Ref Range   Glucose-Capillary 146 (H) 70 - 99 mg/dL    Comment: Glucose reference range applies only to samples taken after fasting for at least 8 hours.  Glucose, capillary     Status: Abnormal   Collection Time: 05/12/23  9:07 PM  Result Value Ref Range   Glucose-Capillary 182 (H) 70 - 99 mg/dL    Comment: Glucose reference range applies only to samples taken after fasting for at least 8 hours.  Glucose, capillary     Status: Abnormal   Collection Time: 05/12/23 11:45 PM  Result Value Ref Range   Glucose-Capillary 194 (H) 70 - 99 mg/dL    Comment: Glucose reference range applies only to samples taken after fasting for at least 8 hours.  Basic metabolic panel     Status: Abnormal   Collection Time: 05/13/23  1:11 AM  Result Value Ref Range   Sodium 132 (L) 135 - 145 mmol/L   Potassium 4.4 3.5 - 5.1 mmol/L   Chloride 107 98 - 111 mmol/L   CO2 21 (L) 22 - 32 mmol/L   Glucose, Bld 206 (H) 70 - 99 mg/dL    Comment: Glucose reference range applies only to samples taken after fasting for at least 8 hours.   BUN 21 8 - 23 mg/dL   Creatinine, Ser 2.13 0.61 - 1.24 mg/dL   Calcium  8.4 (L) 8.9 - 10.3 mg/dL   GFR, Estimated >08 >65 mL/min    Comment: (NOTE) Calculated using the CKD-EPI Creatinine Equation (2021)    Anion gap 4 (L) 5 - 15    Comment: Performed at [facility], [address] [place], [place] [address]  CBC     Status: None   Collection Time: 05/13/23  1:11 AM  Result Value Ref Range   WBC 5.5 4.0 - 10.5 K/uL   RBC 5.05 4.22 - 5.81 MIL/uL   Hemoglobin 15.8 13.0 - 17.0 g/dL   HCT 62.9 52.8 - 41.3 %   MCV 91.3 80.0 - 100.0 fL   MCH 31.3 26.0 - 34.0 pg   MCHC 34.3 30.0 - 36.0 g/dL   RDW 24.4 01.0 - 27.2 %   Platelets 168 150 - 400 K/uL   nRBC 0.0 0.0 - 0.2 %    Comment: Performed at [facility], [address], [place], [place] [address]  Magnesium     Status: None   Collection Time: 05/13/23  1:11 AM  Result Value Ref Range   Magnesium 2.0 1.7 - 2.4 mg/dL    Comment: Performed at [facility], [address] [place], [place] [address]  Phosphorus     Status: None   Collection Time: 05/13/23  1:11 AM  Result Value Ref Range   Phosphorus 3.2 2.5 - 4.6 mg/dL    Comment: Performed at [facility], [address] [place], [place] [address]  Glucose, capillary     Status: Abnormal   Collection Time: 05/13/23  4:00 AM  Result Value Ref Range   Glucose-Capillary 161 (H) 70 - 99 mg/dL    Comment: Glucose reference range applies only to samples taken after fasting for at least 8 hours.  Glucose, capillary     Status: None   Collection Time: 05/13/23  8:09 AM  Result Value Ref Range   Glucose-Capillary 89 70 - 99 mg/dL    Comment: Glucose reference range applies only to samples taken after fasting for at least 8 hours.  Glucose, capillary     Status: Abnormal   Collection Time: 05/13/23 11:44 AM  Result Value Ref Range   Glucose-Capillary 115 (H) 70 - 99 mg/dL    Comment: Glucose reference range applies only to samples taken after fasting for at least 8 hours.  Glucose, capillary     Status: Abnormal   Collection Time: 05/13/23  5:05 PM  Result Value Ref Range   Glucose-Capillary 364 (H) 70 - 99 mg/dL    Comment: Glucose reference range applies only to samples taken after fasting for at least 8 hours.  Glucose, capillary     Status: Abnormal   Collection Time: 05/13/23  7:52 PM  Result Value Ref Range   Glucose-Capillary 238 (H) 70 - 99 mg/dL    Comment: Glucose reference range applies only to samples taken after fasting for at least 8 hours.  Glucose, capillary     Status: Abnormal   Collection Time: 05/13/23 11:46 PM  Result Value Ref Range   Glucose-Capillary 146 (H) 70 - 99 mg/dL    Comment: Glucose reference range applies only to  samples taken after fasting for at least 8 hours.  Glucose, capillary     Status: Abnormal   Collection Time: 05/14/23  3:35 AM  Result Value Ref Range   Glucose-Capillary 252 (H) 70 - 99 mg/dL    Comment: Glucose reference range applies only to samples taken after fasting for at least 8 hours.  Basic metabolic panel     Status: Abnormal   Collection Time: 05/14/23  4:33 AM  Result Value Ref Range   Sodium 135 135 - 145 mmol/L   Potassium 4.5 3.5 - 5.1 mmol/L   Chloride 105 98 - 111 mmol/L   CO2 23 22 - 32 mmol/L   Glucose, Bld 241 (H) 70 - 99 mg/dL    Comment: Glucose reference range applies only to samples taken after fasting for at least 8 hours.   BUN 18 8 - 23 mg/dL   Creatinine, Ser 3.01 0.61 - 1.24 mg/dL   Calcium  8.7 (L) 8.9 - 10.3 mg/dL   GFR, Estimated >60 >10 mL/min    Comment: (NOTE) Calculated using the CKD-EPI Creatinine Equation (2021)    Anion gap 7 5 - 15    Comment: Performed at [facility], [address], [place], [place] [address]  CBC     Status: None   Collection Time: 05/14/23  4:33 AM  Result Value Ref Range   WBC 5.0 4.0 - 10.5 K/uL   RBC 5.06 4.22 - 5.81 MIL/uL   Hemoglobin 15.7 13.0 - 17.0 g/dL   HCT 62.1 30.8 - 65.7 %   MCV 88.7 80.0 - 100.0 fL   MCH 31.0 26.0 - 34.0 pg   MCHC 35.0 30.0 - 36.0 g/dL   RDW 84.6 96.2 - 95.2 %   Platelets 174 150 - 400 K/uL   nRBC 0.0 0.0 - 0.2 %    Comment: Performed at [facility], [address] [place], [place] [address]  Magnesium     Status: None   Collection Time: 05/14/23  4:33 AM  Result Value Ref Range   Magnesium 2.1 1.7 - 2.4 mg/dL    Comment: Performed at [facility], [address] [place], [place] [address]  Phosphorus     Status: None   Collection Time: 05/14/23  4:33 AM  Result Value Ref Range   Phosphorus 3.4 2.5 - 4.6 mg/dL    Comment: Performed at [facility], [address] [place], [place] [address]  Urine Drug Screen, Qualitative ([facility] only)      Status: Abnormal   Collection Time: 05/14/23  5:42 AM  Result Value Ref Range   Tricyclic, Ur Screen NONE DETECTED NONE DETECTED   Amphetamines, Ur Screen NONE DETECTED NONE DETECTED   MDMA (Ecstasy)Ur Screen NONE DETECTED NONE DETECTED   Cocaine Metabolite,Ur [place] NONE DETECTED NONE DETECTED   Opiate, Ur Screen POSITIVE (A) NONE DETECTED   Phencyclidine (PCP) Ur S NONE DETECTED NONE DETECTED   Cannabinoid 50 Ng, Ur [place] NONE DETECTED NONE DETECTED   Barbiturates, Ur Screen NONE DETECTED NONE DETECTED   Benzodiazepine, Ur Scrn POSITIVE (A) NONE DETECTED   Methadone Scn, Ur NONE DETECTED NONE DETECTED    Comment: (NOTE) Tricyclics + metabolites, urine    Cutoff 1000 ng/mL Amphetamines + metabolites, urine  Cutoff 1000 ng/mL MDMA (Ecstasy), urine              Cutoff 500 ng/mL Cocaine Metabolite, urine          Cutoff 300 ng/mL Opiate + metabolites, urine        Cutoff 300 ng/mL Phencyclidine (PCP), urine         Cutoff 25 ng/mL Cannabinoid, urine                 Cutoff 50 ng/mL Barbiturates + metabolites, urine  Cutoff 200 ng/mL Benzodiazepine, urine              Cutoff 200 ng/mL Methadone, urine                   Cutoff 300 ng/mL  The urine drug screen provides only a preliminary, unconfirmed analytical test result and should not be used for non-medical purposes. Clinical consideration and professional judgment should be applied to any positive drug screen result due to possible interfering substances. A more specific alternate chemical method must be used in order to obtain a confirmed analytical result. Gas chromatography / mass spectrometry (GC/MS) is the preferred confirm atory method. Performed at [facility], [address] [place], [place] [address]   Glucose, capillary     Status: Abnormal   Collection Time: 05/14/23  8:40 AM  Result Value Ref Range   Glucose-Capillary 252 (H) 70 - 99 mg/dL    Comment: Glucose reference  range applies only to samples taken  after fasting for at least 8 hours.  Glucose, capillary     Status: Abnormal   Collection Time: 05/14/23 11:25 AM  Result Value Ref Range   Glucose-Capillary 158 (H) 70 - 99 mg/dL    Comment: Glucose reference range applies only to samples taken after fasting for at least 8 hours.  Culture, blood (Routine X 2) w Reflex to ID Panel     Status: None   Collection Time: 05/14/23  3:18 PM   Specimen: BLOOD  Result Value Ref Range   Specimen Description BLOOD BLOOD RIGHT ARM    Special Requests      BOTTLES DRAWN AEROBIC AND ANAEROBIC Blood Culture adequate volume   Culture      NO GROWTH 5 DAYS Performed at [facility], [address] [place], [place] [address]    Report Status 05/22/2023 FINAL   Culture, blood (Routine X 2) w Reflex to ID Panel     Status: None   Collection Time: 05/14/23  3:18 PM   Specimen: BLOOD  Result Value Ref Range   Specimen Description BLOOD BLOOD RIGHT HAND    Special Requests      BOTTLES DRAWN AEROBIC AND ANAEROBIC Blood Culture adequate volume   Culture      NO GROWTH 5 DAYS Performed at [facility], [address] [place], [place] [address]    Report Status 05/22/2023 FINAL   Glucose, capillary     Status: Abnormal   Collection Time: 05/14/23  5:15 PM  Result Value Ref Range   Glucose-Capillary 329 (H) 70 - 99 mg/dL    Comment: Glucose reference range applies only to samples taken after fasting for at least 8 hours.  Glucose, capillary     Status: Abnormal   Collection Time: 05/14/23  9:04 PM  Result Value Ref Range   Glucose-Capillary 275 (H) 70 - 99 mg/dL    Comment: Glucose reference range applies only to samples taken after fasting for at least 8 hours.  Basic metabolic panel     Status: Abnormal   Collection Time: 05/15/23  3:20 AM  Result Value Ref Range   Sodium 135 135 - 145 mmol/L   Potassium 4.9 3.5 - 5.1 mmol/L   Chloride 101 98 - 111 mmol/L   CO2 23 22 - 32 mmol/L   Glucose, Bld 324 (H) 70 - 99 mg/dL     Comment: Glucose reference range applies only to samples taken after fasting for at least 8 hours.   BUN 32 (H) 8 - 23 mg/dL   Creatinine, Ser 1.02 0.61 - 1.24 mg/dL   Calcium  8.8 (L) 8.9 - 10.3 mg/dL   GFR, Estimated >72 >53 mL/min    Comment: (NOTE) Calculated using the CKD-EPI Creatinine Equation (2021)    Anion gap 11 5 - 15    Comment: Performed at [facility], [address] [place], [place] [address]  CBC     Status: None   Collection Time: 05/15/23  3:20 AM  Result Value Ref Range   WBC 5.3 4.0 - 10.5 K/uL   RBC 5.10 4.22 - 5.81 MIL/uL   Hemoglobin 15.7 13.0 - 17.0 g/dL   HCT 34.7 42.5 - 95.6 %   MCV 88.0 80.0 - 100.0 fL   MCH 30.8 26.0 - 34.0 pg   MCHC 35.0 30.0 - 36.0 g/dL   RDW 38.7 56.4 - 33.2 %   Platelets 172 150 - 400 K/uL  nRBC 0.0 0.0 - 0.2 %    Comment: Performed at [facility], [address] [place], [place] [address]  Magnesium     Status: None   Collection Time: 05/15/23  3:20 AM  Result Value Ref Range   Magnesium 2.2 1.7 - 2.4 mg/dL    Comment: Performed at [facility], [address] [place], [place] [address]  Phosphorus     Status: None   Collection Time: 05/15/23  3:20 AM  Result Value Ref Range   Phosphorus 3.7 2.5 - 4.6 mg/dL    Comment: Performed at [facility], [address] [place], [place] [address]  Glucose, capillary     Status: Abnormal   Collection Time: 05/15/23  8:48 AM  Result Value Ref Range   Glucose-Capillary 409 (H) 70 - 99 mg/dL    Comment: Glucose reference range applies only to samples taken after fasting for at least 8 hours.  ECHOCARDIOGRAM COMPLETE     Status: None   Collection Time: 05/15/23  9:30 AM  Result Value Ref Range   BP 126/81 mmHg   Ao pk vel 1.25 m/s   AV Area VTI 2.27 cm2   AR max vel 2.36 cm2   AV Mean grad 3.0 mmHg   AV Peak grad 6.3 mmHg   Single Plane A2C EF 48.9 %   Single Plane A4C EF 51.0 %   Calc EF 49.9 %   S' Lateral 3.50 cm   AV Area mean vel  2.18 cm2   Area-P 1/2 5.54 cm2   MV VTI 2.42 cm2   Est EF 55 - 60%   Glucose, capillary     Status: Abnormal   Collection Time: 05/15/23 12:12 PM  Result Value Ref Range   Glucose-Capillary 191 (H) 70 - 99 mg/dL    Comment: Glucose reference range applies only to samples taken after fasting for at least 8 hours.  Glucose, capillary     Status: Abnormal   Collection Time: 05/15/23  6:01 PM  Result Value Ref Range   Glucose-Capillary 309 (H) 70 - 99 mg/dL    Comment: Glucose reference range applies only to samples taken after fasting for at least 8 hours.  Glucose, capillary     Status: Abnormal   Collection Time: 05/15/23  9:06 PM  Result Value Ref Range   Glucose-Capillary 260 (H) 70 - 99 mg/dL    Comment: Glucose reference range applies only to samples taken after fasting for at least 8 hours.  Glucose, capillary     Status: Abnormal   Collection Time: 05/15/23 10:17 PM  Result Value Ref Range   Glucose-Capillary 294 (H) 70 - 99 mg/dL    Comment: Glucose reference range applies only to samples taken after fasting for at least 8 hours.  Basic metabolic panel     Status: Abnormal   Collection Time: 05/16/23  4:49 AM  Result Value Ref Range   Sodium 131 (L) 135 - 145 mmol/L   Potassium 4.8 3.5 - 5.1 mmol/L   Chloride 99 98 - 111 mmol/L   CO2 26 22 - 32 mmol/L   Glucose, Bld 504 (HH) 70 - 99 mg/dL    Comment: CRITICAL RESULT CALLED TO, READ BACK BY AND VERIFIED WITH JESSIE QUIBOL @ 05/16/23 0600 AB Glucose reference range applies only to samples taken after fasting for at least 8 hours.    BUN 33 (H) 8 - 23 mg/dL   Creatinine, Ser 9.14 (H) 0.61 - 1.24 mg/dL  Calcium  8.5 (L) 8.9 - 10.3 mg/dL   GFR, Estimated >62 >95 mL/min    Comment: (NOTE) Calculated using the CKD-EPI Creatinine Equation (2021)    Anion gap 6 5 - 15    Comment: Performed at [facility], [address] [place], [place] [address]  CBC     Status: None   Collection Time: 05/16/23  4:49 AM   Result Value Ref Range   WBC 4.8 4.0 - 10.5 K/uL   RBC 4.95 4.22 - 5.81 MIL/uL   Hemoglobin 15.4 13.0 - 17.0 g/dL   HCT 24.4 01.0 - 27.2 %   MCV 91.3 80.0 - 100.0 fL   MCH 31.1 26.0 - 34.0 pg   MCHC 34.1 30.0 - 36.0 g/dL   RDW 53.6 64.4 - 03.4 %   Platelets 179 150 - 400 K/uL   nRBC 0.0 0.0 - 0.2 %    Comment: Performed at [facility], [address] [place], [place] [address]  Glucose, capillary     Status: Abnormal   Collection Time: 05/16/23  6:17 AM  Result Value Ref Range   Glucose-Capillary 487 (H) 70 - 99 mg/dL    Comment: Glucose reference range applies only to samples taken after fasting for at least 8 hours.  Glucose, capillary     Status: Abnormal   Collection Time: 05/16/23  7:44 AM  Result Value Ref Range   Glucose-Capillary 363 (H) 70 - 99 mg/dL    Comment: Glucose reference range applies only to samples taken after fasting for at least 8 hours.  Glucose, capillary     Status: Abnormal   Collection Time: 05/16/23 11:19 AM  Result Value Ref Range   Glucose-Capillary 207 (H) 70 - 99 mg/dL    Comment: Glucose reference range applies only to samples taken after fasting for at least 8 hours.  Glucose, capillary     Status: Abnormal   Collection Time: 05/16/23  4:35 PM  Result Value Ref Range   Glucose-Capillary 190 (H) 70 - 99 mg/dL    Comment: Glucose reference range applies only to samples taken after fasting for at least 8 hours.  Glucose, capillary     Status: Abnormal   Collection Time: 05/16/23  7:48 PM  Result Value Ref Range   Glucose-Capillary 298 (H) 70 - 99 mg/dL    Comment: Glucose reference range applies only to samples taken after fasting for at least 8 hours.  Basic metabolic panel     Status: Abnormal   Collection Time: 05/17/23  2:15 AM  Result Value Ref Range   Sodium 132 (L) 135 - 145 mmol/L   Potassium 4.8 3.5 - 5.1 mmol/L   Chloride 100 98 - 111 mmol/L   CO2 25 22 - 32 mmol/L   Glucose, Bld 219 (H) 70 - 99 mg/dL    Comment:  Glucose reference range applies only to samples taken after fasting for at least 8 hours.   BUN 37 (H) 8 - 23 mg/dL   Creatinine, Ser 5.63 (H) 0.61 - 1.24 mg/dL   Calcium  8.3 (L) 8.9 - 10.3 mg/dL   GFR, Estimated 60 (L) >60 mL/min    Comment: (NOTE) Calculated using the CKD-EPI Creatinine Equation (2021)    Anion gap 7 5 - 15    Comment: Performed at [facility], [address] [place], [place] [address]  CBC     Status: None   Collection Time: 05/17/23  2:15 AM  Result Value Ref Range  WBC 6.1 4.0 - 10.5 K/uL   RBC 4.96 4.22 - 5.81 MIL/uL   Hemoglobin 15.5 13.0 - 17.0 g/dL   HCT 64.3 32.9 - 51.8 %   MCV 91.7 80.0 - 100.0 fL   MCH 31.3 26.0 - 34.0 pg   MCHC 34.1 30.0 - 36.0 g/dL   RDW 84.1 66.0 - 63.0 %   Platelets 181 150 - 400 K/uL   nRBC 0.0 0.0 - 0.2 %    Comment: Performed at [facility], [address] [place], [place] [address]  Glucose, capillary     Status: Abnormal   Collection Time: 05/17/23  7:29 AM  Result Value Ref Range   Glucose-Capillary 216 (H) 70 - 99 mg/dL    Comment: Glucose reference range applies only to samples taken after fasting for at least 8 hours.  Glucose, capillary     Status: Abnormal   Collection Time: 05/17/23 11:23 AM  Result Value Ref Range   Glucose-Capillary 237 (H) 70 - 99 mg/dL    Comment: Glucose reference range applies only to samples taken after fasting for at least 8 hours.  Glucose, capillary     Status: Abnormal   Collection Time: 05/17/23  5:41 PM  Result Value Ref Range   Glucose-Capillary 300 (H) 70 - 99 mg/dL    Comment: Glucose reference range applies only to samples taken after fasting for at least 8 hours.  Glucose, capillary     Status: Abnormal   Collection Time: 05/17/23  7:53 PM  Result Value Ref Range   Glucose-Capillary 269 (H) 70 - 99 mg/dL    Comment: Glucose reference range applies only to samples taken after fasting for at least 8 hours.  Basic metabolic panel     Status: Abnormal    Collection Time: 05/18/23  5:19 AM  Result Value Ref Range   Sodium 135 135 - 145 mmol/L   Potassium 5.2 (H) 3.5 - 5.1 mmol/L   Chloride 100 98 - 111 mmol/L   CO2 26 22 - 32 mmol/L   Glucose, Bld 204 (H) 70 - 99 mg/dL    Comment: Glucose reference range applies only to samples taken after fasting for at least 8 hours.   BUN 37 (H) 8 - 23 mg/dL   Creatinine, Ser 9.32 (H) 0.61 - 1.24 mg/dL   Calcium  8.6 (L) 8.9 - 10.3 mg/dL   GFR, Estimated >35 >57 mL/min    Comment: (NOTE) Calculated using the CKD-EPI Creatinine Equation (2021)    Anion gap 9 5 - 15    Comment: Performed at [facility], [address] [place], [place] [address]  CBC     Status: None   Collection Time: 05/18/23  5:19 AM  Result Value Ref Range   WBC 5.6 4.0 - 10.5 K/uL   RBC 4.94 4.22 - 5.81 MIL/uL   Hemoglobin 15.5 13.0 - 17.0 g/dL   HCT 54.2 70.6 - 23.7 %   MCV 93.5 80.0 - 100.0 fL   MCH 31.4 26.0 - 34.0 pg   MCHC 33.5 30.0 - 36.0 g/dL   RDW 62.8 31.5 - 17.6 %   Platelets 173 150 - 400 K/uL   nRBC 0.0 0.0 - 0.2 %    Comment: Performed at [facility], [address], [place], [place] [address]  Magnesium     Status: Abnormal   Collection Time: 05/18/23  5:19 AM  Result Value Ref Range   Magnesium 2.5 (H) 1.7 - 2.4 mg/dL  Comment: Performed at [facility], [address] [place], [place] [address]  Phosphorus     Status: None   Collection Time: 05/18/23  5:19 AM  Result Value Ref Range   Phosphorus 3.1 2.5 - 4.6 mg/dL    Comment: Performed at [facility], [address] [place], [place] [address]  Glucose, capillary     Status: Abnormal   Collection Time: 05/18/23  8:04 AM  Result Value Ref Range   Glucose-Capillary 197 (H) 70 - 99 mg/dL    Comment: Glucose reference range applies only to samples taken after fasting for at least 8 hours.  Glucose, capillary     Status: Abnormal   Collection Time: 05/18/23 12:10 PM  Result Value Ref Range    Glucose-Capillary 144 (H) 70 - 99 mg/dL    Comment: Glucose reference range applies only to samples taken after fasting for at least 8 hours.  Glucose, capillary     Status: Abnormal   Collection Time: 05/18/23  4:08 PM  Result Value Ref Range   Glucose-Capillary 306 (H) 70 - 99 mg/dL    Comment: Glucose reference range applies only to samples taken after fasting for at least 8 hours.  Basic metabolic panel     Status: Abnormal   Collection Time: 05/19/23  4:42 AM  Result Value Ref Range   Sodium 135 135 - 145 mmol/L   Potassium 4.6 3.5 - 5.1 mmol/L   Chloride 99 98 - 111 mmol/L   CO2 30 22 - 32 mmol/L   Glucose, Bld 276 (H) 70 - 99 mg/dL    Comment: Glucose reference range applies only to samples taken after fasting for at least 8 hours.   BUN 33 (H) 8 - 23 mg/dL   Creatinine, Ser 0.98 (H) 0.61 - 1.24 mg/dL   Calcium  8.6 (L) 8.9 - 10.3 mg/dL   GFR, Estimated >11 >91 mL/min    Comment: (NOTE) Calculated using the CKD-EPI Creatinine Equation (2021)    Anion gap 6 5 - 15    Comment: Performed at [facility], [address] [place], [place] [address]  CBC     Status: None   Collection Time: 05/19/23  4:42 AM  Result Value Ref Range   WBC 5.7 4.0 - 10.5 K/uL   RBC 4.80 4.22 - 5.81 MIL/uL   Hemoglobin 14.9 13.0 - 17.0 g/dL   HCT 56.2 13.0 - 86.5 %   MCV 91.3 80.0 - 100.0 fL   MCH 31.0 26.0 - 34.0 pg   MCHC 34.0 30.0 - 36.0 g/dL   RDW 78.4 69.6 - 29.5 %   Platelets 182 150 - 400 K/uL   nRBC 0.0 0.0 - 0.2 %    Comment: Performed at [facility], [address], [place], [place] [address]  Magnesium     Status: None   Collection Time: 05/19/23  4:42 AM  Result Value Ref Range   Magnesium 2.4 1.7 - 2.4 mg/dL    Comment: Performed at [facility], [address], [place], [place] [address]  Phosphorus     Status: None   Collection Time: 05/19/23  4:42 AM  Result Value Ref Range   Phosphorus 3.7 2.5 - 4.6 mg/dL    Comment: Performed at [facility], [address] [place], [place] [address]  Glucose, capillary     Status: Abnormal   Collection Time: 05/19/23  8:41 AM  Result Value Ref Range   Glucose-Capillary 241 (H) 70 -  99 mg/dL    Comment: Glucose reference range applies only to samples taken after fasting for at least 8 hours.  Glucose, capillary     Status: Abnormal   Collection Time: 05/19/23 12:07 PM  Result Value Ref Range   Glucose-Capillary 157 (H) 70 - 99 mg/dL    Comment: Glucose reference range applies only to samples taken after fasting for at least 8 hours.  ECHO TEE     Status: None   Collection Time: 05/19/23  1:43 PM  Result Value Ref Range   Est EF 60 - 65%   Glucose, capillary     Status: Abnormal   Collection Time: 05/19/23  4:34 PM  Result Value Ref Range   Glucose-Capillary 136 (H) 70 - 99 mg/dL    Comment: Glucose reference range applies only to samples taken after fasting for at least 8 hours.  Glucose, capillary     Status: Abnormal   Collection Time: 05/19/23  7:55 PM  Result Value Ref Range   Glucose-Capillary 121 (H) 70 - 99 mg/dL    Comment: Glucose reference range applies only to samples taken after fasting for at least 8 hours.  Basic metabolic panel     Status: Abnormal   Collection Time: 05/20/23  5:05 AM  Result Value Ref Range   Sodium 135 135 - 145 mmol/L   Potassium 4.6 3.5 - 5.1 mmol/L   Chloride 103 98 - 111 mmol/L   CO2 26 22 - 32 mmol/L   Glucose, Bld 122 (H) 70 - 99 mg/dL    Comment: Glucose reference range applies only to samples taken after fasting for at least 8 hours.   BUN 21 8 - 23 mg/dL   Creatinine, Ser 1.61 0.61 - 1.24 mg/dL   Calcium  8.6 (L) 8.9 - 10.3 mg/dL   GFR, Estimated >09 >60 mL/min    Comment: (NOTE) Calculated using the CKD-EPI Creatinine Equation (2021)    Anion gap 6 5 - 15    Comment: Performed at [facility], [address] [place], [place] [address]  CBC     Status: None   Collection Time: 05/20/23  5:05 AM  Result Value  Ref Range   WBC 5.2 4.0 - 10.5 K/uL   RBC 5.01 4.22 - 5.81 MIL/uL   Hemoglobin 15.4 13.0 - 17.0 g/dL   HCT 81.1 91.4 - 78.2 %   MCV 91.2 80.0 - 100.0 fL   MCH 30.7 26.0 - 34.0 pg   MCHC 33.7 30.0 - 36.0 g/dL   RDW 95.6 21.3 - 08.6 %   Platelets 188 150 - 400 K/uL   nRBC 0.0 0.0 - 0.2 %    Comment: Performed at [facility], [address] [place], [place] [address]  Magnesium     Status: None   Collection Time: 05/20/23  5:05 AM  Result Value Ref Range   Magnesium 2.1 1.7 - 2.4 mg/dL    Comment: Performed at [facility], [address], [place], [place] [address]  Phosphorus     Status: None   Collection Time: 05/20/23  5:05 AM  Result Value Ref Range   Phosphorus 3.1 2.5 - 4.6 mg/dL    Comment: Performed at [facility], [address] [place], [place] [address]  Glucose, capillary     Status: Abnormal   Collection Time: 05/20/23  8:00 AM  Result Value Ref Range   Glucose-Capillary 118 (H) 70 - 99 mg/dL    Comment: Glucose reference range  applies only to samples taken after fasting for at least 8 hours.  Glucose, capillary     Status: Abnormal   Collection Time: 05/20/23 11:51 AM  Result Value Ref Range   Glucose-Capillary 134 (H) 70 - 99 mg/dL    Comment: Glucose reference range applies only to samples taken after fasting for at least 8 hours.   Comment 1 Notify RN   Glucose, capillary     Status: Abnormal   Collection Time: 05/20/23  4:08 PM  Result Value Ref Range   Glucose-Capillary 161 (H) 70 - 99 mg/dL    Comment: Glucose reference range applies only to samples taken after fasting for at least 8 hours.  Glucose, capillary     Status: Abnormal   Collection Time: 05/20/23 10:02 PM  Result Value Ref Range   Glucose-Capillary 113 (H) 70 - 99 mg/dL    Comment: Glucose reference range applies only to samples taken after fasting for at least 8 hours.  Basic metabolic panel     Status: Abnormal   Collection Time: 05/21/23  4:02 AM  Result  Value Ref Range   Sodium 132 (L) 135 - 145 mmol/L   Potassium 4.4 3.5 - 5.1 mmol/L   Chloride 99 98 - 111 mmol/L   CO2 26 22 - 32 mmol/L   Glucose, Bld 243 (H) 70 - 99 mg/dL    Comment: Glucose reference range applies only to samples taken after fasting for at least 8 hours.   BUN 22 8 - 23 mg/dL   Creatinine, Ser 8.29 0.61 - 1.24 mg/dL   Calcium  8.3 (L) 8.9 - 10.3 mg/dL   GFR, Estimated >56 >21 mL/min    Comment: (NOTE) Calculated using the CKD-EPI Creatinine Equation (2021)    Anion gap 7 5 - 15    Comment: Performed at [facility], [address] [place], [place] [address]  CBC     Status: None   Collection Time: 05/21/23  4:02 AM  Result Value Ref Range   WBC 6.0 4.0 - 10.5 K/uL   RBC 4.93 4.22 - 5.81 MIL/uL   Hemoglobin 15.3 13.0 - 17.0 g/dL   HCT 78.4 69.6 - 29.5 %   MCV 89.9 80.0 - 100.0 fL   MCH 31.0 26.0 - 34.0 pg   MCHC 34.5 30.0 - 36.0 g/dL   RDW 28.4 13.2 - 44.0 %   Platelets 178 150 - 400 K/uL   nRBC 0.0 0.0 - 0.2 %    Comment: Performed at [facility], [address] [place], [place] [address]  Glucose, capillary     Status: Abnormal   Collection Time: 05/21/23  8:15 AM  Result Value Ref Range   Glucose-Capillary 264 (H) 70 - 99 mg/dL    Comment: Glucose reference range applies only to samples taken after fasting for at least 8 hours.  C-reactive protein     Status: Abnormal   Collection Time: 06/04/23 10:29 AM  Result Value Ref Range   CRP 1.5 (H) <1.0 mg/dL    Comment: Performed at [facility], [address] [place], [place] [address]  Sedimentation rate     Status: None   Collection Time: 06/04/23 10:29 AM  Result Value Ref Range   Sed Rate 7 0 - 20 mm/hr    Comment: Performed at [facility], [address], [place], [place] [address]  Comprehensive metabolic panel     Status: Abnormal   Collection Time: 06/04/23 10:29 AM  Result  Value Ref Range   Sodium 139 135 - 145 mmol/L   Potassium 4.1 3.5 - 5.1 mmol/L    Chloride 110 98 - 111 mmol/L   CO2 21 (L) 22 - 32 mmol/L   Glucose, Bld 251 (H) 70 - 99 mg/dL    Comment: Glucose reference range applies only to samples taken after fasting for at least 8 hours.   BUN 21 8 - 23 mg/dL   Creatinine, Ser 9.60 0.61 - 1.24 mg/dL   Calcium  8.5 (L) 8.9 - 10.3 mg/dL   Total Protein 7.1 6.5 - 8.1 g/dL   Albumin 3.1 (L) 3.5 - 5.0 g/dL   AST 12 (L) 15 - 41 U/L   ALT 24 0 - 44 U/L   Alkaline Phosphatase 61 38 - 126 U/L   Total Bilirubin 0.2 0.0 - 1.2 mg/dL   GFR, Estimated >45 >40 mL/min    Comment: (NOTE) Calculated using the CKD-EPI Creatinine Equation (2021)    Anion gap 8 5 - 15    Comment: Performed at [facility], [address] [place], [place] [address]  CBC with Differential/Platelet     Status: None   Collection Time: 06/04/23 10:29 AM  Result Value Ref Range   WBC 8.1 4.0 - 10.5 K/uL   RBC 5.11 4.22 - 5.81 MIL/uL   Hemoglobin 15.8 13.0 - 17.0 g/dL   HCT 14.7 82.9 - 56.2 %   MCV 92.8 80.0 - 100.0 fL   MCH 30.9 26.0 - 34.0 pg   MCHC 33.3 30.0 - 36.0 g/dL   RDW 13.0 86.5 - 78.4 %   Platelets 211 150 - 400 K/uL   nRBC 0.0 0.0 - 0.2 %   Neutrophils Relative % 68 %   Neutro Abs 5.5 1.7 - 7.7 K/uL   Lymphocytes Relative 18 %   Lymphs Abs 1.4 0.7 - 4.0 K/uL   Monocytes Relative 12 %   Monocytes Absolute 0.9 0.1 - 1.0 K/uL   Eosinophils Relative 1 %   Eosinophils Absolute 0.1 0.0 - 0.5 K/uL   Basophils Relative 0 %   Basophils Absolute 0.0 0.0 - 0.1 K/uL   Immature Granulocytes 1 %   Abs Immature Granulocytes 0.04 0.00 - 0.07 K/uL    Comment: Performed at [facility], [address] [place], [place] [address]    Radiology No results found.  Assessment/Plan  Atherosclerosis of native arteries of the extremities with ulceration (HCC) ABIs were done today on the right side showed noncompressible ABIs but good posterior tibial waveform which was triphasic and a digital pressure of 82 which should be adequate for  wound healing. Continue local wound care.  We will keep a close interval follow-up of about 3 months with ABIs. He has already lost the left leg from ulceration and malperfusion.  His wound is now healed.  We will make a referral to the [facility] for prosthetic evaluation.  HTN (hypertension) blood pressure control important in reducing the progression of atherosclerotic disease. On appropriate oral medications.   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 Also, involved in wound healing. On appropriate medications.   Hx of BKA, left [facility]) Referral to [facility]    Mikki Alexander, MD  06/19/2023 11:28 AM    This note was created with Dragon medical transcription system.  Any errors from dictation are purely unintentional

## 2023-06-22 LAB — VAS US ABI WITH/WO TBI

## 2023-07-01 ENCOUNTER — Ambulatory Visit: Attending: Vascular Surgery | Admitting: Occupational Therapy

## 2023-07-02 ENCOUNTER — Emergency Department
Admission: EM | Admit: 2023-07-02 | Discharge: 2023-07-02 | Disposition: A | Discharge status: Home / Self Care | Attending: Emergency Medicine | Admitting: Emergency Medicine

## 2023-07-02 ENCOUNTER — Emergency Department

## 2023-07-02 ENCOUNTER — Other Ambulatory Visit: Payer: Self-pay

## 2023-07-02 DIAGNOSIS — N179 Acute kidney failure, unspecified: Secondary | ICD-10-CM | POA: Diagnosis not present

## 2023-07-02 DIAGNOSIS — E1122 Type 2 diabetes mellitus with diabetic chronic kidney disease: Secondary | ICD-10-CM | POA: Diagnosis not present

## 2023-07-02 DIAGNOSIS — R197 Diarrhea, unspecified: Secondary | ICD-10-CM

## 2023-07-02 DIAGNOSIS — I129 Hypertensive chronic kidney disease with stage 1 through stage 4 chronic kidney disease, or unspecified chronic kidney disease: Secondary | ICD-10-CM | POA: Diagnosis not present

## 2023-07-02 DIAGNOSIS — E860 Dehydration: Secondary | ICD-10-CM

## 2023-07-02 DIAGNOSIS — N183 Chronic kidney disease, stage 3 unspecified: Secondary | ICD-10-CM | POA: Diagnosis not present

## 2023-07-02 LAB — URINALYSIS, ROUTINE W REFLEX MICROSCOPIC
Bacteria, UA: NONE SEEN
Bilirubin Urine: NEGATIVE
Glucose, UA: >=500 mg/dL — AB
Hgb urine dipstick: NEGATIVE
Ketones, ur: NEGATIVE mg/dL
Leukocytes,Ua: NEGATIVE
Nitrite: NEGATIVE
Protein, ur: 100 mg/dL — AB
Specific Gravity, Urine: 1.022 (ref 1.005–1.030)
pH: 5 (ref 5.0–8.0)

## 2023-07-02 LAB — COMPREHENSIVE METABOLIC PANEL WITH GFR
ALT: 20 U/L (ref 0–44)
AST: 17 U/L (ref 15–41)
Albumin: 3.5 g/dL (ref 3.5–5.0)
Alkaline Phosphatase: 51 U/L (ref 38–126)
Anion gap: 10 (ref 5–15)
BUN: 25 mg/dL — ABNORMAL HIGH (ref 8–23)
CO2: 22 mmol/L (ref 22–32)
Calcium: 9 mg/dL (ref 8.9–10.3)
Chloride: 102 mmol/L (ref 98–111)
Creatinine, Ser: 1.49 mg/dL — ABNORMAL HIGH (ref 0.61–1.24)
GFR, Estimated: 51 mL/min — ABNORMAL LOW (ref >60)
Glucose, Bld: 205 mg/dL — ABNORMAL HIGH (ref 70–99)
Potassium: 4.6 mmol/L (ref 3.5–5.1)
Sodium: 134 mmol/L — ABNORMAL LOW (ref 135–145)
Total Bilirubin: 0.7 mg/dL (ref 0.0–1.2)
Total Protein: 6.8 g/dL (ref 6.5–8.1)

## 2023-07-02 LAB — CBC
HCT: 49.4 % (ref 39.0–52.0)
Hemoglobin: 17 g/dL (ref 13.0–17.0)
MCH: 31.2 pg (ref 26.0–34.0)
MCHC: 34.4 g/dL (ref 30.0–36.0)
MCV: 90.6 fL (ref 80.0–100.0)
Platelets: 222 10*3/uL (ref 150–400)
RBC: 5.45 MIL/uL (ref 4.22–5.81)
RDW: 13.7 % (ref 11.5–15.5)
WBC: 6.6 10*3/uL (ref 4.0–10.5)
nRBC: 0 % (ref 0.0–0.2)

## 2023-07-02 LAB — CBG MONITORING, ED: Glucose-Capillary: 192 mg/dL — ABNORMAL HIGH (ref 70–99)

## 2023-07-02 MED ORDER — IOHEXOL 300 MG/ML  SOLN
100.0000 mL | Freq: Once | INTRAMUSCULAR | Status: AC | PRN
  Administered 2023-07-02: 100 mL via INTRAVENOUS

## 2023-07-02 MED ORDER — LACTATED RINGERS IV BOLUS
1000.0000 mL | Freq: Once | INTRAVENOUS | Status: AC
  Administered 2023-07-02: 1000 mL via INTRAVENOUS

## 2023-07-02 NOTE — Discharge Instructions (Signed)
 I suspect your diarrheal illness has caused your low blood pressure from dehydration and fluid loss.  Things improved after you got fluids today.  CT of your abdomen was reassuring at this time.  Please make sure you are staying hydrated throughout this to prevent your blood pressure going lower.  Please follow-up with your primary care provider for reassessment.

## 2023-07-02 NOTE — ED Notes (Signed)
 First nurse note- pt brought in via ems from [facility].  Pt hypotensive, light headed.  Fsbs 213 per ems.  Iv 500 LR infused, NS 500cc infusing now.  20left hand.    Pt in wheelchair in lobby.    Bp 119/64 P-89  O2 sats 99%

## 2023-07-02 NOTE — ED Provider Notes (Signed)
 [facility] Provider Note    Event Date/Time   First MD Initiated Contact with Patient 07/02/23 1840     (approximate)   History   Hypotension   HPI Corey Foster is a 68 y.o. male with history of HTN, DM2, CKD stage III, HLD presenting today for hypotension.  Patient was reportedly going to see his PCP today for routine checkup and was found to be hypotensive there.  EMS was called and transported to the ED for further care.  Patient states he has had some diarrhea for the past couple days and started having lower abdominal pain today.  No fevers, cough, congestion, chest pain, shortness of breath, nausea, vomiting.  No pain when he urinates.  Blood pressure improved on arrival here.     Physical Exam   Triage Vital Signs: ED Triage Vitals [07/02/23 1642]  Encounter Vitals Group     BP (!) 88/58     Systolic BP Percentile      Diastolic BP Percentile      Pulse Rate 85     Resp 20     Temp 97.8 F (36.6 C)     Temp Source Oral     SpO2 96 %     Weight 240 lb (108.9 kg)     Height 5\' 11"  (1.803 m)     Head Circumference      Peak Flow      Pain Score 0     Pain Loc      Pain Education      Exclude from Growth Chart     Most recent vital signs: Vitals:   07/02/23 1642 07/02/23 1843  BP: (!) 88/58 99/70  Pulse: 85 86  Resp: 20 19  Temp: 97.8 F (36.6 C) 98.1 F (36.7 C)  SpO2: 96% 95%   Physical Exam: I have reviewed the vital signs and nursing notes. General: Awake, alert, no acute distress.  Nontoxic appearing. Head:  Atraumatic, normocephalic.   ENT:  EOM intact, PERRL. Oral mucosa is pink and moist with no lesions. Neck: Neck is supple with full range of motion, No meningeal signs. Cardiovascular:  RRR, No murmurs. Peripheral pulses palpable and equal bilaterally. Respiratory:  Symmetrical chest wall expansion.  No rhonchi, rales, or wheezes.  Good air movement throughout.  No use of accessory muscles.   Musculoskeletal:  No  cyanosis or edema. Moving extremities with full ROM Abdomen:  Soft, tenderness to palpation throughout bilateral lower quadrants and suprapubic region, nondistended. Neuro:  GCS 15, moving all four extremities, interacting appropriately. Speech clear. Psych:  Calm, appropriate.   Skin:  Warm, dry, no rash.    ED Results / Procedures / Treatments   Labs (all labs ordered are listed, but only abnormal results are displayed) Labs Reviewed  COMPREHENSIVE METABOLIC PANEL WITH GFR - Abnormal; Notable for the following components:      Result Value   Sodium 134 (*)    Glucose, Bld 205 (*)    BUN 25 (*)    Creatinine, Ser 1.49 (*)    GFR, Estimated 51 (*)    All other components within normal limits  URINALYSIS, ROUTINE W REFLEX MICROSCOPIC - Abnormal; Notable for the following components:   Color, Urine YELLOW (*)    APPearance CLEAR (*)    Glucose, UA >=500 (*)    Protein, ur 100 (*)    All other components within normal limits  CBG MONITORING, ED - Abnormal; Notable for the following  components:   Glucose-Capillary 192 (*)    All other components within normal limits  CBC     EKG My EKG interpretation: Rate of 84, normal sinus rhythm, normal axis, normal intervals.  No acute ST elevations or depressions   RADIOLOGY Independently interpreted CT abdomen/pelvis with no acute pathology   PROCEDURES:  Critical Care performed: No  Procedures   MEDICATIONS ORDERED IN ED: Medications  lactated ringers  bolus 1,000 mL (1,000 mLs Intravenous New Bag/Given 07/02/23 1933)  iohexol  (OMNIPAQUE ) 300 MG/ML solution 100 mL (100 mLs Intravenous Contrast Given 07/02/23 1944)     IMPRESSION / MDM / ASSESSMENT AND PLAN / ED COURSE  I reviewed the triage vital signs and the nursing notes.                              Differential diagnosis includes, but is not limited to, diverticulitis, diarrheal illness, diverticulitis, colitis, enteritis, cystitis  Patient's presentation is most  consistent with acute complicated illness / injury requiring diagnostic workup.  Patient is a 68 year old male presenting today for hypotension at PCP office and new diarrhea and lower abdominal pain.  Hypotension has improved on arrival here after getting 500 cc of fluid.  Will give additional 1 L of LR.  Does have tenderness palpation in lower abdomen with diarrhea also will get CT for further evaluation.  Otherwise CBC unremarkable.  CMP shows slight AKI from his baseline.  CT abdomen/pelvis shows no acute pathology.  Patient's blood pressure responded appropriately to fluids now maintaining consistently over 120 systolic.  No ongoing symptoms.  Tolerated p.o. without issue.  Will discharge and have him follow-up with PCP as needed.  Instructed on fluid hydration while having the ongoing diarrhea.  Given strict return precautions.  The patient is on the cardiac monitor to evaluate for evidence of arrhythmia and/or significant heart rate changes. Clinical Course as of 07/02/23 2044  Thu Jul 02, 2023  2038 CT ABDOMEN PELVIS W CONTRAST No acute pathology [DW]    Clinical Course User Index [DW] Kandee Orion, MD     FINAL CLINICAL IMPRESSION(S) / ED DIAGNOSES   Final diagnoses:  Diarrhea, unspecified type  AKI (acute kidney injury) (HCC)  Dehydration     Rx / DC Orders   ED Discharge Orders     None        Note:  This document was prepared using Dragon voice recognition software and may include unintentional dictation errors.   Kandee Orion, MD 07/02/23 2045

## 2023-07-02 NOTE — ED Notes (Signed)
  Went over discharge paperwork with patient.  Patient calling family now to come pick him up.

## 2023-07-02 NOTE — ED Triage Notes (Signed)
 Patient states he was at his PCP for routine check up and became hypotensive; EMS was called and transported here.   20G Left hand 500 NS

## 2023-07-14 ENCOUNTER — Encounter (INDEPENDENT_AMBULATORY_CARE_PROVIDER_SITE_OTHER): Payer: Self-pay

## 2023-08-21 ENCOUNTER — Ambulatory Visit (INDEPENDENT_AMBULATORY_CARE_PROVIDER_SITE_OTHER): Admitting: Nurse Practitioner

## 2023-08-21 ENCOUNTER — Ambulatory Visit (INDEPENDENT_AMBULATORY_CARE_PROVIDER_SITE_OTHER)

## 2023-08-21 ENCOUNTER — Encounter (INDEPENDENT_AMBULATORY_CARE_PROVIDER_SITE_OTHER): Payer: Self-pay | Admitting: Nurse Practitioner

## 2023-08-21 VITALS — BP 96/58 | HR 85

## 2023-08-21 DIAGNOSIS — E1152 Type 2 diabetes mellitus with diabetic peripheral angiopathy with gangrene: Secondary | ICD-10-CM

## 2023-08-21 DIAGNOSIS — E11621 Type 2 diabetes mellitus with foot ulcer: Secondary | ICD-10-CM

## 2023-08-21 DIAGNOSIS — I7025 Atherosclerosis of native arteries of other extremities with ulceration: Secondary | ICD-10-CM | POA: Diagnosis not present

## 2023-08-21 DIAGNOSIS — L97509 Non-pressure chronic ulcer of other part of unspecified foot with unspecified severity: Secondary | ICD-10-CM

## 2023-08-21 DIAGNOSIS — Z89512 Acquired absence of left leg below knee: Secondary | ICD-10-CM

## 2023-08-21 DIAGNOSIS — I10 Essential (primary) hypertension: Secondary | ICD-10-CM

## 2023-08-23 ENCOUNTER — Encounter (INDEPENDENT_AMBULATORY_CARE_PROVIDER_SITE_OTHER): Payer: Self-pay | Admitting: Nurse Practitioner

## 2023-08-23 NOTE — Progress Notes (Signed)
 Subjective:    Patient ID: Corey Foster, male    DOB: 09-Feb-1956, 68 y.o.   MRN: [id number] Chief Complaint  Patient presents with   Follow-up     Follow up with Virginio Isidore E, NP (Vascular Surgery) in 3 months (08/19/2023); ABI       Patient returns today for follow-up studies regarding his peripheral arterial disease after his left below-knee amputation.  Today the wound that was present on his right heel is healing very well and appears just to be a very shallow area.  He has been working with his prosthetists and should be hopefully getting his prosthetic leg soon.  Today he has noncompressible ABIs with a TBI of 0.96.  He has strong biphasic waveforms noted.    Review of Systems  Skin:  Positive for wound.  Neurological:  Positive for weakness.  All other systems reviewed and are negative.      Objective:   Physical Exam Vitals reviewed.  HENT:     Head: Normocephalic.   Cardiovascular:     Rate and Rhythm: Normal rate.  Pulmonary:     Effort: Pulmonary effort is normal.   Musculoskeletal:     Amputation Left Lower Extremity: Left leg is amputated below knee.   Skin:    General: Skin is warm and dry.   Neurological:     Mental Status: He is alert and oriented to person, place, and time.     Gait: Gait abnormal.   Psychiatric:        Mood and Affect: Mood normal.        Behavior: Behavior normal.        Thought Content: Thought content normal.        Judgment: Judgment normal.     BP (!) 96/58   Pulse 85   Past Medical History:  Diagnosis Date   Arthritis    Diabetes mellitus without complication (HCC)    DJD (degenerative joint disease)    GERD (gastroesophageal reflux disease)    Hyperlipidemia    Hypertension    Sciatic nerve pain    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   Sleep apnea     Social History   Socioeconomic History   Marital status: Single    Spouse name: Not on file   Number of children: Not on file   Years of education:  Not on file   Highest education level: Not on file  Occupational History   Not on file  Tobacco Use   Smoking status: Every Day    Current packs/day: 0.25    Average packs/day: 0.3 packs/day for 40.0 years (10.0 ttl pk-yrs)    Types: Cigarettes   Smokeless tobacco: Never  Vaping Use   Vaping status: Never Used  Substance and Sexual Activity   Alcohol use: Yes    Alcohol/week: 18.0 standard drinks of alcohol    Types: 12 Cans of beer, 6 Shots of liquor per week   Drug use: Not Currently    Types: Cocaine, Marijuana   Sexual activity: Yes  Other Topics Concern   Not on file  Social History Narrative   Not on file   Social Drivers of Health   Financial Resource Strain: Not on file  Food Insecurity: No Food Insecurity (05/12/2023)   Hunger Vital Sign    Worried About Running Out of Food in the Last Year: Never true    Ran Out of Food in the Last Year: Never true  Transportation Needs: No Transportation Needs (  05/12/2023)   PRAPARE - Administrator, Civil Service (Medical): No    Lack of Transportation (Non-Medical): No  Physical Activity: Not on file  Stress: Not on file  Social Connections: Unknown (05/12/2023)   Social Connection and Isolation Panel    Frequency of Communication with Friends and Family: More than three times a week    Frequency of Social Gatherings with Friends and Family: Three times a week    Attends Religious Services: 1 to 4 times per year    Active Member of Clubs or Organizations: No    Attends Banker Meetings: Never    Marital Status: Patient declined  Intimate Partner Violence: Not At Risk (05/12/2023)   Humiliation, Afraid, Rape, and Kick questionnaire    Fear of Current or Ex-Partner: No    Emotionally Abused: No    Physically Abused: No    Sexually Abused: No    Past Surgical History:  Procedure Laterality Date   AMPUTATION Left 11/29/2022   Procedure: AMPUTATION RAY, PARTIAL 1ST RAY;  Surgeon: Lennie Barter, DPM;   Location: [facility];  Service: Orthopedics/Podiatry;  Laterality: Left;   AMPUTATION Left 12/19/2022   Procedure: FIRST LEFT RAY AMPUTATION;  Surgeon: Lennie Barter, DPM;  Location: [facility];  Service: Orthopedics/Podiatry;  Laterality: Left;   AMPUTATION Left 01/09/2023   Procedure: AMPUTATION BELOW KNEE;  Surgeon: Marea Selinda RAMAN, MD;  Location: [facility];  Service: General;  Laterality: Left;   CYSTOSCOPY W/ RETROGRADES Bilateral 05/31/2019   Procedure: CYSTOSCOPY WITH RETROGRADE PYELOGRAM;  Surgeon: Twylla Glendia BROCKS, MD;  Location: [facility];  Service: Urology;  Laterality: Bilateral;   LOWER EXTREMITY ANGIOGRAPHY Left 11/28/2022   Procedure: Lower Extremity Angiography;  Surgeon: Marea Selinda RAMAN, MD;  Location: [facility];  Service: Cardiovascular;  Laterality: Left;   LOWER EXTREMITY ANGIOGRAPHY Left 12/18/2022   Procedure: Lower Extremity Angiography;  Surgeon: Marea Selinda RAMAN, MD;  Location: [facility];  Service: Cardiovascular;  Laterality: Left;   LOWER EXTREMITY ANGIOGRAPHY Right 05/13/2023   Procedure: Lower Extremity Angiography;  Surgeon: Marea Selinda RAMAN, MD;  Location: [facility];  Service: Cardiovascular;  Laterality: Right;   TEE WITHOUT CARDIOVERSION N/A 05/19/2023   Procedure: ECHOCARDIOGRAM, TRANSESOPHAGEAL;  Surgeon: Perla Evalene PARAS, MD;  Location: [facility];  Service: Cardiovascular;  Laterality: N/A;   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 -C N/A 05/31/2019   Procedure: TRANSURETHRAL RESECTION OF BLADDER TUMOR WITH gemcitabine ;  Surgeon: Twylla Glendia BROCKS, MD;  Location: [facility];  Service: Urology;  Laterality: N/A;    Family History  Problem Relation Age of Onset   Diabetes Mother    Heart disease Mother    Diabetes Father    Heart disease Father    Diabetes Brother     No Known Allergies     Latest Ref Rng & Units 07/02/2023    5:11 PM 06/04/2023   10:29 AM 05/21/2023    4:02 AM  CBC  WBC 4.0 - 10.5 K/uL 6.6  8.1  6.0   Hemoglobin  13.0 - 17.0 g/dL 82.9  84.1  84.6   Hematocrit 39.0 - 52.0 % 49.4  47.4  44.3   Platelets 150 - 400 K/uL 222  211  178       CMP     Component Value Date/Time   NA 134 (L) 07/02/2023 1711   K 4.6 07/02/2023 1711   CL 102 07/02/2023 1711   CO2 22 07/02/2023 1711   GLUCOSE  205 (H) 07/02/2023 1711   BUN 25 (H) 07/02/2023 1711   CREATININE 1.49 (H) 07/02/2023 1711   CALCIUM  9.0 07/02/2023 1711   PROT 6.8 07/02/2023 1711   ALBUMIN 3.5 07/02/2023 1711   AST 17 07/02/2023 1711   ALT 20 07/02/2023 1711   ALKPHOS 51 07/02/2023 1711   BILITOT 0.7 07/02/2023 1711   GFRNONAA 51 (L) 07/02/2023 1711     No results found.     Assessment & Plan:   1. Hx of left BKA (HCC) (Primary) Patient is currently working with [facility] to obtain his prosthetic.  Will continue to assist as necessary.  2. Atherosclerosis of native arteries of the extremities with ulceration (HCC) Patient's studies are adequate for perfusion to heal his wound.  In reviewing the previous pictures the wound has healed greatly in that time.  Will have him return in 6 months with noninvasive studies or sooner if issues arise.  3. Primary hypertension Continue antihypertensive medications as already ordered, these medications have been reviewed and there are no changes at this time.  4. 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  Hgb A1C to be monitored as already arranged by primary service   Current Outpatient Medications on File Prior to Visit  Medication Sig Dispense Refill   acetaminophen  (TYLENOL ) 325 MG tablet Take 2 tablets (650 mg total) by mouth every 6 (six) hours as needed for mild pain (pain score 1-3), moderate pain (pain score 4-6), fever or headache (or Fever >/= 101).     ALPRAZolam  (XANAX ) 1 MG tablet Take 1 tablet (1 mg total) by mouth 3 (three) times daily  as needed for anxiety. 10 tablet 0   AMBULATORY NON FORMULARY MEDICATION Trimix (30/1/10)-(Pap/Phent/PGE)  Dosage: Inject 0.3 cc and may increase 0.1cc to achieve and erection lasting no longer than 1 hour per injection   Vial 1ml  Qty #5 refills 2  [facility] [phone] Fax [phone] 5 mL 2   aspirin  EC 325 MG tablet Take 325 mg by mouth daily.     baclofen  (LIORESAL ) 10 MG tablet Take 10 mg by mouth 2 (two) times daily.      bisacodyl  (DULCOLAX) 10 MG suppository Place 1 suppository (10 mg total) rectally daily as needed for severe constipation.     cefadroxil  (DURICEF) 500 MG capsule Take 1 capsule (500 mg total) by mouth 2 (two) times daily. 14 capsule 0   Continuous Glucose Sensor (FREESTYLE LIBRE 2 SENSOR) MISC      JARDIANCE  25 MG TABS tablet Take 25 mg by mouth daily.     naloxone (NARCAN) 0.4 MG/ML injection Inject as directed as directed     NOVOLOG  FLEXPEN 100 UNIT/ML FlexPen Inject 40 Units into the skin 3 (three) times daily with meals.     Nutritional Supplements (KETO PO) Take 1 capsule by mouth.     Oxycodone  HCl 20 MG TABS Take 1 tablet by mouth 4 (four) times daily as needed.     polyethylene glycol (MIRALAX  / GLYCOLAX ) 17 g packet Take 17 g by mouth 2 (two) times daily.     pregabalin  (LYRICA ) 75 MG capsule Take 1 capsule (75 mg total) by mouth every evening. 30 capsule 5   simvastatin  (ZOCOR ) 40 MG tablet Take 1 tablet (40 mg total) by mouth daily. 30 tablet 0   TOUJEO  MAX SOLOSTAR 300 UNIT/ML Solostar Pen Inject 80-120  Units into the skin in the morning and at bedtime. Inject 80 Units into the skin daily. 80 units in the am and 120 units at bedtime     vitamin C  (ASCORBIC ACID ) 250 MG tablet Take 250 mg by mouth daily.     Vitamin D , Ergocalciferol , (DRISDOL ) 1.25 MG (50000 UNIT) CAPS capsule Take 1 capsule (50,000 Units total) by mouth every 7 (seven) days. 12 capsule 0   bisacodyl  (DULCOLAX) 5 MG EC tablet Take 2 tablets (10 mg total) by mouth at  bedtime.     No current facility-administered medications on file prior to visit.    There are no Patient Instructions on file for this visit. No follow-ups on file.   Husein Guedes E Heber Hoog, NP

## 2023-08-23 NOTE — Progress Notes (Incomplete)
 Subjective:    Patient ID: Corey Foster, male    DOB: [birth year], 68 y.o.   MRN: [id number] Chief Complaint  Patient presents with  . Follow-up     Follow up with Bayle Calvo E, NP (Vascular Surgery) in 3 months (08/19/2023); ABI       HPI  Review of Systems     Objective:   Physical Exam  BP (!) 96/58   Pulse 85   Past Medical History:  Diagnosis Date  . Arthritis   . Diabetes mellitus without complication (HCC)   . DJD (degenerative joint disease)   . GERD (gastroesophageal reflux disease)   . Hyperlipidemia   . Hypertension   . Sciatic nerve pain   . 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 Sleep apnea     Social History   Socioeconomic History  . Marital status: Single    Spouse name: Not on file  . Number of children: Not on file  . Years of education: Not on file  . Highest education level: Not on file  Occupational History  . Not on file  Tobacco Use  . Smoking status: Every Day    Current packs/day: 0.25    Average packs/day: 0.3 packs/day for 40.0 years (10.0 ttl pk-yrs)    Types: Cigarettes  . Smokeless tobacco: Never  Vaping Use  . Vaping status: Never Used  Substance and Sexual Activity  . Alcohol use: Yes    Alcohol/week: 18.0 standard drinks of alcohol    Types: 12 Cans of beer, 6 Shots of liquor per week  . Drug use: Not Currently    Types: Cocaine, Marijuana  . Sexual activity: Yes  Other Topics Concern  . Not on file  Social History Narrative  . Not on file   Social Drivers of Health   Financial Resource Strain: Not on file  Food Insecurity: No Food Insecurity (05/12/2023)   Hunger Vital Sign   . Worried About Programme researcher, broadcasting/film/video in the Last Year: Never true   . Ran Out of Food in the Last Year: Never true  Transportation Needs: No Transportation Needs (05/12/2023)   PRAPARE - Transportation   . Lack of Transportation (Medical): No   . Lack of Transportation (Non-Medical): No  Physical Activity: Not on file  Stress: Not on  file  Social Connections: Unknown (05/12/2023)   Social Connection and Isolation Panel   . Frequency of Communication with Friends and Family: More than three times a week   . Frequency of Social Gatherings with Friends and Family: Three times a week   . Attends Religious Services: 1 to 4 times per year   . Active Member of Clubs or Organizations: No   . Attends Banker Meetings: Never   . Marital Status: Patient declined  Intimate Partner Violence: Not At Risk (05/12/2023)   Humiliation, Afraid, Rape, and Kick questionnaire   . Fear of Current or Ex-Partner: No   . Emotionally Abused: No   . Physically Abused: No   . Sexually Abused: No    Past Surgical History:  Procedure Laterality Date  . AMPUTATION Left 11/29/2022   Procedure: AMPUTATION RAY, PARTIAL 1ST RAY;  Surgeon: Lennie Barter, DPM;  Location: [facility];  Service: Orthopedics/Podiatry;  Laterality: Left;  . AMPUTATION Left 12/19/2022   Procedure: FIRST LEFT RAY AMPUTATION;  Surgeon: Lennie Barter, DPM;  Location: [facility];  Service: Orthopedics/Podiatry;  Laterality: Left;  . AMPUTATION Left 01/09/2023   Procedure: AMPUTATION BELOW KNEE;  Surgeon:  Marea Selinda RAMAN, MD;  Location: [facility];  Service: General;  Laterality: Left;  . CYSTOSCOPY W/ RETROGRADES Bilateral 05/31/2019   Procedure: CYSTOSCOPY WITH RETROGRADE PYELOGRAM;  Surgeon: Twylla Glendia BROCKS, MD;  Location: [facility];  Service: Urology;  Laterality: Bilateral;  . LOWER EXTREMITY ANGIOGRAPHY Left 11/28/2022   Procedure: Lower Extremity Angiography;  Surgeon: Marea Selinda RAMAN, MD;  Location: [facility];  Service: Cardiovascular;  Laterality: Left;  . LOWER EXTREMITY ANGIOGRAPHY Left 12/18/2022   Procedure: Lower Extremity Angiography;  Surgeon: Marea Selinda RAMAN, MD;  Location: [facility];  Service: Cardiovascular;  Laterality: Left;  . LOWER EXTREMITY ANGIOGRAPHY Right 05/13/2023   Procedure: Lower Extremity Angiography;  Surgeon: Marea Selinda RAMAN, MD;   Location: [facility];  Service: Cardiovascular;  Laterality: Right;  . TEE WITHOUT CARDIOVERSION N/A 05/19/2023   Procedure: ECHOCARDIOGRAM, TRANSESOPHAGEAL;  Surgeon: Perla Evalene PARAS, MD;  Location: [facility];  Service: Cardiovascular;  Laterality: N/A;  . 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 -C N/A 05/31/2019   Procedure: TRANSURETHRAL RESECTION OF BLADDER TUMOR WITH gemcitabine ;  Surgeon: Twylla Glendia BROCKS, MD;  Location: [facility];  Service: Urology;  Laterality: N/A;    Family History  Problem Relation Age of Onset  . Diabetes Mother   . Heart disease Mother   . Diabetes Father   . Heart disease Father   . Diabetes Brother     No Known Allergies     Latest Ref Rng & Units 07/02/2023    5:11 PM 06/04/2023   10:29 AM 05/21/2023    4:02 AM  CBC  WBC 4.0 - 10.5 K/uL 6.6  8.1  6.0   Hemoglobin 13.0 - 17.0 g/dL 82.9  84.1  84.6   Hematocrit 39.0 - 52.0 % 49.4  47.4  44.3   Platelets 150 - 400 K/uL 222  211  178       CMP     Component Value Date/Time   NA 134 (L) 07/02/2023 1711   K 4.6 07/02/2023 1711   CL 102 07/02/2023 1711   CO2 22 07/02/2023 1711   GLUCOSE 205 (H) 07/02/2023 1711   BUN 25 (H) 07/02/2023 1711   CREATININE 1.49 (H) 07/02/2023 1711   CALCIUM  9.0 07/02/2023 1711   PROT 6.8 07/02/2023 1711   ALBUMIN 3.5 07/02/2023 1711   AST 17 07/02/2023 1711   ALT 20 07/02/2023 1711   ALKPHOS 51 07/02/2023 1711   BILITOT 0.7 07/02/2023 1711   GFRNONAA 51 (L) 07/02/2023 1711     No results found.     Assessment & Plan:   1. Hx of left BKA (HCC) (Primary) ***  2. Atherosclerosis of native arteries of the extremities with ulceration (HCC) ***  3. Primary hypertension ***  4. 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 acetaminophen  (TYLENOL ) 325 MG tablet Take 2 tablets (650 mg total) by mouth every 6 (six) hours as needed  for mild pain (pain score 1-3), moderate pain (pain score 4-6), fever or headache (or Fever >/= 101).    . ALPRAZolam  (XANAX ) 1 MG tablet Take 1 tablet (1 mg total) by mouth 3 (three) times daily as needed for anxiety. 10 tablet 0  . AMBULATORY NON FORMULARY MEDICATION Trimix (30/1/10)-(Pap/Phent/PGE)  Dosage: Inject 0.3 cc and may increase 0.1cc to achieve and erection lasting no longer than 1 hour per injection   Vial 1ml  Qty #5 refills 2  [facility] [phone] Fax [phone] 5 mL 2  . aspirin  EC 325 MG tablet Take 325 mg by mouth daily.    . baclofen  (LIORESAL ) 10 MG tablet Take 10 mg by mouth 2 (two) times daily.     . bisacodyl  (DULCOLAX) 10 MG suppository Place 1 suppository (10 mg total) rectally daily as needed for severe constipation.    . cefadroxil  (DURICEF) 500 MG capsule Take 1 capsule (500 mg total) by mouth 2 (two) times daily. 14 capsule 0  . Continuous Glucose Sensor (FREESTYLE LIBRE 2 SENSOR) MISC     . JARDIANCE  25 MG TABS tablet Take 25 mg by mouth daily.    . naloxone (NARCAN) 0.4 MG/ML injection Inject as directed as directed    . NOVOLOG  FLEXPEN 100 UNIT/ML FlexPen Inject 40 Units into the skin 3 (three) times daily with meals.    . Nutritional Supplements (KETO PO) Take 1 capsule by mouth.    . Oxycodone  HCl 20 MG TABS Take 1 tablet by mouth 4 (four) times daily as needed.    . polyethylene glycol (MIRALAX  / GLYCOLAX ) 17 g packet Take 17 g by mouth 2 (two) times daily.    . pregabalin  (LYRICA ) 75 MG capsule Take 1 capsule (75 mg total) by mouth every evening. 30 capsule 5  . simvastatin  (ZOCOR ) 40 MG tablet Take 1 tablet (40 mg total) by mouth daily. 30 tablet 0  . TOUJEO  MAX SOLOSTAR 300 UNIT/ML Solostar Pen Inject 80-120 Units into the skin in the morning and at bedtime. Inject 80 Units into the skin daily. 80 units in the am and 120 units at bedtime    . vitamin C  (ASCORBIC ACID ) 250 MG tablet Take 250 mg by mouth daily.    . Vitamin D ,  Ergocalciferol , (DRISDOL ) 1.25 MG (50000 UNIT) CAPS capsule Take 1 capsule (50,000 Units total) by mouth every 7 (seven) days. 12 capsule 0  . bisacodyl  (DULCOLAX) 5 MG EC tablet Take 2 tablets (10 mg total) by mouth at bedtime.     No current facility-administered medications on file prior to visit.    There are no Patient Instructions on file for this visit. No follow-ups on file.   Herchel Hopkin E Starletta Houchin, NP

## 2023-08-24 LAB — VAS US ABI WITH/WO TBI

## 2023-09-18 ENCOUNTER — Ambulatory Visit (INDEPENDENT_AMBULATORY_CARE_PROVIDER_SITE_OTHER): Admitting: Vascular Surgery

## 2023-09-18 ENCOUNTER — Encounter (INDEPENDENT_AMBULATORY_CARE_PROVIDER_SITE_OTHER)

## 2023-10-20 ENCOUNTER — Encounter (INDEPENDENT_AMBULATORY_CARE_PROVIDER_SITE_OTHER): Payer: Medicare HMO

## 2023-10-20 ENCOUNTER — Ambulatory Visit (INDEPENDENT_AMBULATORY_CARE_PROVIDER_SITE_OTHER): Payer: Medicare HMO | Admitting: Vascular Surgery

## 2024-02-10 ENCOUNTER — Telehealth: Payer: Self-pay | Admitting: Urology

## 2024-02-10 NOTE — Telephone Encounter (Signed)
 Patient called and scheduled an appointment with Dr. Twylla for 03/01/24. He is needing medication refills for Tadalafil  and Trimix. He is asking if he can get refill. Pharmacy is [facility]. Please advise patient.

## 2024-02-15 ENCOUNTER — Other Ambulatory Visit: Payer: Self-pay

## 2024-02-16 ENCOUNTER — Other Ambulatory Visit (INDEPENDENT_AMBULATORY_CARE_PROVIDER_SITE_OTHER): Payer: Self-pay | Admitting: Nurse Practitioner

## 2024-02-16 DIAGNOSIS — Z9889 Other specified postprocedural states: Secondary | ICD-10-CM

## 2024-02-16 NOTE — Telephone Encounter (Signed)
 Called patient to let him know that per Dr. Twylla,  medication cannot be refilled prior to his appointment because it has been so long since he has been seen. Patient verbalized understanding.  At this time, there are no sooner appointments available with Dr. Twylla or PA. Will add to wait list.

## 2024-02-23 ENCOUNTER — Encounter (INDEPENDENT_AMBULATORY_CARE_PROVIDER_SITE_OTHER)

## 2024-02-23 ENCOUNTER — Ambulatory Visit (INDEPENDENT_AMBULATORY_CARE_PROVIDER_SITE_OTHER): Admitting: Vascular Surgery

## 2024-03-01 ENCOUNTER — Ambulatory Visit (INDEPENDENT_AMBULATORY_CARE_PROVIDER_SITE_OTHER): Admitting: Urology

## 2024-03-01 ENCOUNTER — Encounter: Payer: Self-pay | Admitting: Internal Medicine

## 2024-03-01 ENCOUNTER — Encounter: Payer: Self-pay | Admitting: Urology

## 2024-03-01 VITALS — BP 140/80 | HR 74 | Ht 71.0 in | Wt 229.0 lb

## 2024-03-01 DIAGNOSIS — Z8551 Personal history of malignant neoplasm of bladder: Secondary | ICD-10-CM

## 2024-03-01 DIAGNOSIS — N5201 Erectile dysfunction due to arterial insufficiency: Secondary | ICD-10-CM

## 2024-03-01 MED ORDER — SILDENAFIL CITRATE 100 MG PO TABS: ORAL_TABLET | ORAL | 0 refills | Status: DC

## 2024-03-01 MED ORDER — AMBULATORY NON FORMULARY MEDICATION: 2 refills | Status: AC

## 2024-03-01 NOTE — Progress Notes (Signed)
 "  03/01/2024 8:06 AM   Corey Foster May 18, 1955 [id number]  Referring provider: Lorel Maxie LABOR, MD [address] [place],  [place] [address]  Chief Complaint  Patient presents with   Medication Refill   Urologic history: 1.  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  Indeterminate left renal mass on Noncon CT Renal mass protocol MRI with 1.3 cm simple renal cyst  3.  Erectile dysfunction Trimix/sildenafil  prn   HPI: Corey Foster is a 69 y.o. male presents for medication refill.  Last visit was 08/19/2021; requesting refills on Trimix and sildenafil  Last surveillance cystoscopy was in 2023.  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   Sleep apnea     Surgical History: Past Surgical History:  Procedure Laterality Date   AMPUTATION Left 11/29/2022   Procedure: AMPUTATION RAY, PARTIAL 1ST RAY;  Surgeon: Lennie Barter, DPM;  Location: [facility];  Service: Orthopedics/Podiatry;  Laterality: Left;   AMPUTATION Left 12/19/2022   Procedure: FIRST LEFT RAY AMPUTATION;  Surgeon: Lennie Barter, DPM;  Location: [facility];  Service: Orthopedics/Podiatry;  Laterality: Left;   AMPUTATION Left 01/09/2023   Procedure: AMPUTATION BELOW KNEE;  Surgeon: Marea Selinda RAMAN, MD;  Location: [facility];  Service: General;  Laterality: Left;   CYSTOSCOPY W/ RETROGRADES Bilateral 05/31/2019   Procedure: CYSTOSCOPY WITH RETROGRADE PYELOGRAM;  Surgeon: Twylla Glendia BROCKS, MD;  Location: [facility];  Service: Urology;  Laterality: Bilateral;   LOWER EXTREMITY ANGIOGRAPHY Left 11/28/2022   Procedure: Lower Extremity Angiography;  Surgeon:  Marea Selinda RAMAN, MD;  Location: [facility];  Service: Cardiovascular;  Laterality: Left;   LOWER EXTREMITY ANGIOGRAPHY Left 12/18/2022   Procedure: Lower Extremity Angiography;  Surgeon: Marea Selinda RAMAN, MD;  Location: [facility];  Service: Cardiovascular;  Laterality: Left;   LOWER EXTREMITY ANGIOGRAPHY Right 05/13/2023   Procedure: Lower Extremity Angiography;  Surgeon: Marea Selinda RAMAN, MD;  Location: [facility];  Service: Cardiovascular;  Laterality: Right;   TEE WITHOUT CARDIOVERSION N/A 05/19/2023   Procedure: ECHOCARDIOGRAM, TRANSESOPHAGEAL;  Surgeon: Perla Evalene PARAS, MD;  Location: [facility];  Service: Cardiovascular;  Laterality: N/A;   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 -C N/A 05/31/2019   Procedure: TRANSURETHRAL RESECTION OF BLADDER TUMOR WITH gemcitabine ;  Surgeon: Twylla Glendia BROCKS, MD;  Location: [facility];  Service: Urology;  Laterality: N/A;    Home Medications:  Allergies as of 03/01/2024   No Known Allergies      Medication List        Accurate as of March 01, 2024  8:06 AM. If you have any questions, ask your nurse or doctor.          acetaminophen  325 MG tablet Commonly known as: TYLENOL  Take 2 tablets (650 mg total) by mouth every 6 (six) hours as needed for mild pain (pain score 1-3), moderate pain (pain score 4-6), fever or headache (or Fever >/= 101).   ALPRAZolam  1 MG tablet Commonly known as: XANAX  Take 1 tablet (1 mg total) by mouth 3 (three) times daily as needed for anxiety.   AMBULATORY NON FORMULARY MEDICATION  Trimix (30/1/10)-(Pap/Phent/PGE)  Dosage: Inject 0.3 cc and may increase 0.1cc to achieve and erection lasting no longer than 1 hour per injection   Vial 1ml  Qty #5 refills 2  [facility] [phone] Fax [phone]   aspirin  EC 325 MG tablet Take 325 mg by mouth daily.   baclofen  10 MG tablet Commonly known as: LIORESAL  Take 10 mg by mouth 2 (two) times daily.   bisacodyl  5 MG  EC tablet Commonly known as: DULCOLAX Take 2 tablets (10 mg total) by mouth at bedtime.   bisacodyl  10 MG suppository Commonly known as: DULCOLAX Place 1 suppository (10 mg total) rectally daily as needed for severe constipation.   cefadroxil  500 MG capsule Commonly known as: DURICEF Take 1 capsule (500 mg total) by mouth 2 (two) times daily.   FreeStyle Libre 2 Sensor Misc   Jardiance  25 MG Tabs tablet Generic drug: empagliflozin  Take 25 mg by mouth daily.   KETO PO Take 1 capsule by mouth.   naloxone 0.4 MG/ML injection Commonly known as: NARCAN Inject as directed as directed   NovoLOG  FlexPen 100 UNIT/ML FlexPen Generic drug: insulin  aspart Inject 40 Units into the skin 3 (three) times daily with meals.   Oxycodone  HCl 20 MG Tabs Take 1 tablet by mouth 4 (four) times daily as needed.   polyethylene glycol 17 g packet Commonly known as: MIRALAX  / GLYCOLAX  Take 17 g by mouth 2 (two) times daily.   pregabalin  75 MG capsule Commonly known as: LYRICA  Take 1 capsule (75 mg total) by mouth every evening.   sildenafil  100 MG tablet Commonly known as: VIAGRA  TAKE 2 TO 5 TABLETS BY MOUTH 1 HOUR PRIOR TO INTERCOURSE. Started by: Glendia Barba, MD   simvastatin  40 MG tablet Commonly known as: ZOCOR  Take 1 tablet (40 mg total) by mouth daily.   Toujeo  Max SoloStar 300 UNIT/ML Solostar Pen Generic drug: insulin  glargine (2 Unit Dial ) Inject 80-120 Units into the skin in the morning and at bedtime. Inject 80 Units into the skin daily. 80 units in the am and 120 units at bedtime   vitamin C  250 MG tablet Commonly known as: ASCORBIC ACID  Take 250 mg by mouth daily.        Allergies: Allergies[1]  Family History: Family History  Problem Relation Age of Onset   Diabetes Mother    Heart disease Mother    Diabetes Father    Heart disease Father    Diabetes Brother     Social History:  reports that he has been smoking cigarettes. He has a 10 pack-year smoking  history. He has never used smokeless tobacco. He reports current alcohol use of about 18.0 standard drinks of alcohol per week. He reports that he does not currently use drugs after having used the following drugs: Cocaine and Marijuana.   Physical Exam: BP (!) 140/80   Pulse 74   Ht 5' 11 (1.803 m)   Wt 229 lb (103.9 kg)   BMI 31.94 kg/m   Constitutional:  Alert, No acute distress. HEENT: [place] AT Respiratory: Normal respiratory effort, no increased work of breathing. Psychiatric: Normal mood and affect.   Assessment & Plan:    1.  Personal history of bladder cancer Recommend scheduling surveillance cystoscopy  2.  Erectile dysfunction Trimix and sildenafil  refilled  3.  Prostate cancer screening On chart review after patient left office no recent PSA.  Lab visit scheduled for PSA  Glendia JAYSON Barba, MD  [facility] [place] [address]  [address], [address] [place], [place] [address] [phone]    [1] No Known Allergies  "

## 2024-03-02 ENCOUNTER — Ambulatory Visit: Payer: Self-pay | Admitting: Urology

## 2024-03-02 LAB — PSA: Prostate Specific Ag, Serum: 0.7 ng/mL (ref 0.0–4.0)

## 2024-03-10 ENCOUNTER — Telehealth: Payer: Self-pay

## 2024-03-10 MED ORDER — SILDENAFIL CITRATE 100 MG PO TABS: 100.0000 mg | ORAL_TABLET | Freq: Every day | ORAL | 2 refills | Status: AC | PRN

## 2024-03-10 MED ORDER — SILDENAFIL CITRATE 100 MG PO TABS: ORAL_TABLET | ORAL | 0 refills | Status: DC

## 2024-03-10 NOTE — Telephone Encounter (Signed)
 Patient called in, New Rx with corrected dosage sent to [facility]. Spoke with Dr. Stoioff and Sheppard Hones, PA who verified.

## 2024-03-10 NOTE — Telephone Encounter (Signed)
 Patient called and asked for Rx to be sent to [facility]. Spoke with Dr. Stoioff who approved. Medication sent.

## 2024-03-10 NOTE — Addendum Note (Signed)
 Addended by: RUTHER SETTER A on: 03/10/2024 05:06 PM   Modules accepted: Orders

## 2024-03-11 ENCOUNTER — Telehealth: Payer: Self-pay

## 2024-03-11 NOTE — Telephone Encounter (Signed)
 [facility] with [facility] left message on triage line regarding pts Sildenafil  rx.   [facility] sent in corrected rx yesterday at 506p.  S/w [facility] at [facility] they have corrected rx.

## 2024-04-05 ENCOUNTER — Other Ambulatory Visit: Admitting: Urology
